# Patient Record
Sex: Female | Born: 1950 | Race: White | Hispanic: No | Marital: Married | Smoking: Never smoker
Health system: Southern US, Community
[De-identification: ages and names within clinical notes are randomized; demographics above are authoritative.]

## PROBLEM LIST (undated history)

## (undated) DIAGNOSIS — T4145XA Adverse effect of unspecified anesthetic, initial encounter: Secondary | ICD-10-CM

## (undated) DIAGNOSIS — K5792 Diverticulitis of intestine, part unspecified, without perforation or abscess without bleeding: Secondary | ICD-10-CM

## (undated) DIAGNOSIS — G709 Myoneural disorder, unspecified: Secondary | ICD-10-CM

## (undated) DIAGNOSIS — L719 Rosacea, unspecified: Secondary | ICD-10-CM

## (undated) DIAGNOSIS — I2510 Atherosclerotic heart disease of native coronary artery without angina pectoris: Secondary | ICD-10-CM

## (undated) DIAGNOSIS — G4730 Sleep apnea, unspecified: Secondary | ICD-10-CM

## (undated) DIAGNOSIS — M8580 Other specified disorders of bone density and structure, unspecified site: Secondary | ICD-10-CM

## (undated) DIAGNOSIS — M12812 Other specific arthropathies, not elsewhere classified, left shoulder: Secondary | ICD-10-CM

## (undated) DIAGNOSIS — I10 Essential (primary) hypertension: Secondary | ICD-10-CM

## (undated) DIAGNOSIS — A0472 Enterocolitis due to Clostridium difficile, not specified as recurrent: Secondary | ICD-10-CM

## (undated) DIAGNOSIS — F419 Anxiety disorder, unspecified: Secondary | ICD-10-CM

## (undated) DIAGNOSIS — M7072 Other bursitis of hip, left hip: Secondary | ICD-10-CM

## (undated) DIAGNOSIS — F32A Depression, unspecified: Secondary | ICD-10-CM

## (undated) DIAGNOSIS — E0590 Thyrotoxicosis, unspecified without thyrotoxic crisis or storm: Secondary | ICD-10-CM

## (undated) DIAGNOSIS — Z87440 Personal history of urinary (tract) infections: Secondary | ICD-10-CM

## (undated) DIAGNOSIS — C801 Malignant (primary) neoplasm, unspecified: Secondary | ICD-10-CM

## (undated) DIAGNOSIS — R112 Nausea with vomiting, unspecified: Secondary | ICD-10-CM

## (undated) DIAGNOSIS — K219 Gastro-esophageal reflux disease without esophagitis: Secondary | ICD-10-CM

## (undated) DIAGNOSIS — M810 Age-related osteoporosis without current pathological fracture: Secondary | ICD-10-CM

## (undated) DIAGNOSIS — E785 Hyperlipidemia, unspecified: Secondary | ICD-10-CM

## (undated) DIAGNOSIS — T7840XA Allergy, unspecified, initial encounter: Secondary | ICD-10-CM

## (undated) DIAGNOSIS — H269 Unspecified cataract: Secondary | ICD-10-CM

## (undated) DIAGNOSIS — Z9889 Other specified postprocedural states: Secondary | ICD-10-CM

## (undated) DIAGNOSIS — T8859XA Other complications of anesthesia, initial encounter: Secondary | ICD-10-CM

## (undated) DIAGNOSIS — M1990 Unspecified osteoarthritis, unspecified site: Secondary | ICD-10-CM

## (undated) HISTORY — DX: Anxiety disorder, unspecified: F41.9

## (undated) HISTORY — PX: TONSILLECTOMY: SUR1361

## (undated) HISTORY — PX: COLONOSCOPY: SHX174

## (undated) HISTORY — PX: LUMBAR DISC SURGERY: SHX700

## (undated) HISTORY — DX: Hyperlipidemia, unspecified: E78.5

## (undated) HISTORY — PX: TUBAL LIGATION: SHX77

## (undated) HISTORY — PX: JOINT REPLACEMENT: SHX530

## (undated) HISTORY — PX: FRACTURE SURGERY: SHX138

## (undated) HISTORY — PX: LAPAROSCOPIC LIVER CYST FENESTRATION: SUR776

## (undated) HISTORY — DX: Sleep apnea, unspecified: G47.30

## (undated) HISTORY — PX: APPENDECTOMY: SHX54

## (undated) HISTORY — DX: Diverticulitis of intestine, part unspecified, without perforation or abscess without bleeding: K57.92

## (undated) HISTORY — DX: Allergy, unspecified, initial encounter: T78.40XA

## (undated) HISTORY — DX: Gastro-esophageal reflux disease without esophagitis: K21.9

## (undated) HISTORY — DX: Other specified disorders of bone density and structure, unspecified site: M85.80

## (undated) HISTORY — PX: SPINE SURGERY: SHX786

## (undated) HISTORY — PX: KNEE SURGERY: SHX244

## (undated) HISTORY — PX: BACK SURGERY: SHX140

## (undated) HISTORY — PX: FOOT SURGERY: SHX648

## (undated) HISTORY — DX: Age-related osteoporosis without current pathological fracture: M81.0

## (undated) HISTORY — PX: EYE SURGERY: SHX253

## (undated) HISTORY — DX: Myoneural disorder, unspecified: G70.9

## (undated) HISTORY — DX: Unspecified cataract: H26.9

## (undated) HISTORY — PX: CHOLECYSTECTOMY: SHX55

---

## 1998-08-31 ENCOUNTER — Other Ambulatory Visit: Admission: RE | Admit: 1998-08-31 | Discharge: 1998-08-31 | Payer: Self-pay | Admitting: Obstetrics and Gynecology

## 1998-09-25 ENCOUNTER — Other Ambulatory Visit: Admission: RE | Admit: 1998-09-25 | Discharge: 1998-09-25 | Payer: Self-pay | Admitting: Obstetrics and Gynecology

## 1998-09-25 ENCOUNTER — Encounter (INDEPENDENT_AMBULATORY_CARE_PROVIDER_SITE_OTHER): Payer: Self-pay

## 1999-07-15 ENCOUNTER — Ambulatory Visit (HOSPITAL_COMMUNITY): Admission: RE | Admit: 1999-07-15 | Discharge: 1999-07-15 | Payer: Self-pay | Admitting: Gastroenterology

## 1999-11-10 ENCOUNTER — Other Ambulatory Visit: Admission: RE | Admit: 1999-11-10 | Discharge: 1999-11-10 | Payer: Self-pay | Admitting: Obstetrics and Gynecology

## 2000-05-22 ENCOUNTER — Encounter: Admission: RE | Admit: 2000-05-22 | Discharge: 2000-05-22 | Payer: Self-pay | Admitting: Family Medicine

## 2000-05-22 ENCOUNTER — Encounter: Payer: Self-pay | Admitting: Family Medicine

## 2000-09-26 ENCOUNTER — Other Ambulatory Visit: Admission: RE | Admit: 2000-09-26 | Discharge: 2000-09-26 | Payer: Self-pay | Admitting: Obstetrics and Gynecology

## 2001-08-08 ENCOUNTER — Encounter: Payer: Self-pay | Admitting: Family Medicine

## 2001-08-08 ENCOUNTER — Encounter: Payer: Self-pay | Admitting: General Surgery

## 2001-08-08 ENCOUNTER — Encounter: Admission: RE | Admit: 2001-08-08 | Discharge: 2001-08-08 | Payer: Self-pay | Admitting: Family Medicine

## 2001-08-09 ENCOUNTER — Ambulatory Visit (HOSPITAL_COMMUNITY): Admission: RE | Admit: 2001-08-09 | Discharge: 2001-08-11 | Payer: Self-pay | Admitting: General Surgery

## 2001-08-09 ENCOUNTER — Encounter (INDEPENDENT_AMBULATORY_CARE_PROVIDER_SITE_OTHER): Payer: Self-pay | Admitting: *Deleted

## 2001-08-09 ENCOUNTER — Encounter: Payer: Self-pay | Admitting: General Surgery

## 2001-09-06 ENCOUNTER — Other Ambulatory Visit
Admission: RE | Admit: 2001-09-06 | Discharge: 2001-09-06 | Payer: Self-pay | Admitting: Physical Medicine & Rehabilitation

## 2002-07-08 ENCOUNTER — Ambulatory Visit (HOSPITAL_COMMUNITY): Admission: RE | Admit: 2002-07-08 | Discharge: 2002-07-08 | Payer: Self-pay | Admitting: General Surgery

## 2002-07-08 ENCOUNTER — Encounter: Payer: Self-pay | Admitting: General Surgery

## 2002-09-10 ENCOUNTER — Other Ambulatory Visit: Admission: RE | Admit: 2002-09-10 | Discharge: 2002-09-10 | Payer: Self-pay | Admitting: Obstetrics and Gynecology

## 2003-08-28 ENCOUNTER — Encounter: Admission: RE | Admit: 2003-08-28 | Discharge: 2003-08-28 | Payer: Self-pay | Admitting: Family Medicine

## 2003-10-16 ENCOUNTER — Other Ambulatory Visit: Admission: RE | Admit: 2003-10-16 | Discharge: 2003-10-16 | Payer: Self-pay | Admitting: Obstetrics and Gynecology

## 2003-12-11 ENCOUNTER — Encounter: Admission: RE | Admit: 2003-12-11 | Discharge: 2003-12-11 | Payer: Self-pay | Admitting: Family Medicine

## 2003-12-26 ENCOUNTER — Encounter: Admission: RE | Admit: 2003-12-26 | Discharge: 2003-12-26 | Payer: Self-pay | Admitting: Family Medicine

## 2004-01-01 ENCOUNTER — Encounter: Admission: RE | Admit: 2004-01-01 | Discharge: 2004-01-01 | Payer: Self-pay | Admitting: Family Medicine

## 2004-06-17 ENCOUNTER — Encounter: Admission: RE | Admit: 2004-06-17 | Discharge: 2004-06-17 | Payer: Self-pay | Admitting: Family Medicine

## 2004-10-28 ENCOUNTER — Other Ambulatory Visit: Admission: RE | Admit: 2004-10-28 | Discharge: 2004-10-28 | Payer: Self-pay | Admitting: Obstetrics and Gynecology

## 2005-11-01 ENCOUNTER — Other Ambulatory Visit: Admission: RE | Admit: 2005-11-01 | Discharge: 2005-11-01 | Payer: Self-pay | Admitting: Obstetrics and Gynecology

## 2006-11-08 ENCOUNTER — Other Ambulatory Visit: Admission: RE | Admit: 2006-11-08 | Discharge: 2006-11-08 | Payer: Self-pay | Admitting: Obstetrics and Gynecology

## 2007-11-28 ENCOUNTER — Other Ambulatory Visit: Admission: RE | Admit: 2007-11-28 | Discharge: 2007-11-28 | Payer: Self-pay | Admitting: Obstetrics and Gynecology

## 2008-04-30 ENCOUNTER — Encounter: Payer: Self-pay | Admitting: Family Medicine

## 2008-08-12 ENCOUNTER — Encounter: Admission: RE | Admit: 2008-08-12 | Discharge: 2008-08-12 | Payer: Self-pay | Admitting: Orthopedic Surgery

## 2008-09-18 ENCOUNTER — Encounter: Admission: RE | Admit: 2008-09-18 | Discharge: 2008-10-27 | Payer: Self-pay | Admitting: Orthopedic Surgery

## 2008-10-01 ENCOUNTER — Encounter: Admission: RE | Admit: 2008-10-01 | Discharge: 2008-10-01 | Payer: Self-pay | Admitting: Orthopedic Surgery

## 2008-12-01 ENCOUNTER — Other Ambulatory Visit: Admission: RE | Admit: 2008-12-01 | Discharge: 2008-12-01 | Payer: Self-pay | Admitting: Obstetrics and Gynecology

## 2008-12-01 ENCOUNTER — Encounter (INDEPENDENT_AMBULATORY_CARE_PROVIDER_SITE_OTHER): Payer: Self-pay | Admitting: *Deleted

## 2008-12-01 LAB — CONVERTED CEMR LAB: Pap Smear: NORMAL

## 2008-12-29 ENCOUNTER — Encounter: Payer: Self-pay | Admitting: Family Medicine

## 2008-12-30 ENCOUNTER — Ambulatory Visit (HOSPITAL_COMMUNITY): Admission: RE | Admit: 2008-12-30 | Discharge: 2008-12-31 | Payer: Self-pay | Admitting: Neurological Surgery

## 2009-05-04 ENCOUNTER — Encounter: Payer: Self-pay | Admitting: Family Medicine

## 2009-05-05 ENCOUNTER — Encounter (INDEPENDENT_AMBULATORY_CARE_PROVIDER_SITE_OTHER): Payer: Self-pay | Admitting: *Deleted

## 2009-05-05 LAB — CONVERTED CEMR LAB
ALT: 19 units/L
AST: 22 units/L
Albumin: 4.2 g/dL
Alkaline Phosphatase: 76 units/L
BUN: 23 mg/dL
Calcium: 9.3 mg/dL
Chloride, Serum: 107 mmol/L
Cholesterol: 224 mg/dL
Creatinine, Ser: 0.74 mg/dL
Globulin: 2 g/dL
Glucose, Bld: 97 mg/dL
HCT: 41.4 %
HDL: 61 mg/dL
Hemoglobin: 14.2 g/dL
LDL Cholesterol: 106 mg/dL
MCH: 31.6 pg
MCV: 92 fL
Platelets: 217 10*3/uL
Potassium, serum: 3.7 mmol/L
RBC count: 4.5 10*6/uL
Sodium, serum: 142 mmol/L
TSH: 0.74 microintl units/mL
Total Bilirubin: 0.5 mg/dL
Total Protein: 6.2 g/dL
Triglycerides: 285 mg/dL
WBC, blood: 4.4 10*3/uL

## 2009-11-05 ENCOUNTER — Encounter (INDEPENDENT_AMBULATORY_CARE_PROVIDER_SITE_OTHER): Payer: Self-pay | Admitting: *Deleted

## 2009-11-05 DIAGNOSIS — F418 Other specified anxiety disorders: Secondary | ICD-10-CM

## 2009-11-05 DIAGNOSIS — M810 Age-related osteoporosis without current pathological fracture: Secondary | ICD-10-CM

## 2009-11-08 ENCOUNTER — Encounter: Payer: Self-pay | Admitting: Family Medicine

## 2009-12-03 ENCOUNTER — Other Ambulatory Visit: Admission: RE | Admit: 2009-12-03 | Discharge: 2009-12-03 | Payer: Self-pay | Admitting: Family Medicine

## 2009-12-03 ENCOUNTER — Ambulatory Visit: Payer: Self-pay | Admitting: Family Medicine

## 2009-12-03 ENCOUNTER — Encounter (INDEPENDENT_AMBULATORY_CARE_PROVIDER_SITE_OTHER): Payer: Self-pay | Admitting: *Deleted

## 2009-12-03 DIAGNOSIS — K219 Gastro-esophageal reflux disease without esophagitis: Secondary | ICD-10-CM | POA: Insufficient documentation

## 2009-12-03 DIAGNOSIS — R252 Cramp and spasm: Secondary | ICD-10-CM | POA: Insufficient documentation

## 2009-12-03 DIAGNOSIS — E781 Pure hyperglyceridemia: Secondary | ICD-10-CM

## 2009-12-03 DIAGNOSIS — Z8719 Personal history of other diseases of the digestive system: Secondary | ICD-10-CM | POA: Insufficient documentation

## 2009-12-03 DIAGNOSIS — N390 Urinary tract infection, site not specified: Secondary | ICD-10-CM | POA: Insufficient documentation

## 2009-12-08 ENCOUNTER — Encounter (INDEPENDENT_AMBULATORY_CARE_PROVIDER_SITE_OTHER): Payer: Self-pay | Admitting: *Deleted

## 2009-12-08 LAB — CONVERTED CEMR LAB: Pap Smear: NEGATIVE

## 2009-12-23 ENCOUNTER — Encounter: Payer: Self-pay | Admitting: Family Medicine

## 2010-01-12 ENCOUNTER — Encounter (INDEPENDENT_AMBULATORY_CARE_PROVIDER_SITE_OTHER): Payer: Self-pay | Admitting: *Deleted

## 2010-03-21 ENCOUNTER — Encounter: Payer: Self-pay | Admitting: Family Medicine

## 2010-04-01 NOTE — Miscellaneous (Signed)
  Clinical Lists Changes  Problems: Added new problem of ANXIETY (ICD-300.00) Added new problem of HYPOTHYROIDISM (ICD-244.9) Added new problem of OSTEOPENIA (ICD-733.90) Observations: Added new observation of PAST MED HX: Anxiety Hypothyroidism Osteopenia  (11/05/2009 16:36) Added new observation of PMH OSTEOPEN: yes (11/05/2009 16:36) Added new observation of HYPOTHYRDHX: yes (11/05/2009 16:36) Added new observation of PMH ANXIETY: yes (11/05/2009 16:36) Added new observation of BONE DENSITY: osteopenia (12/29/2008 16:51) Added new observation of PAP SMEAR: normal (12/01/2008 16:51)      Past Medical History:    Anxiety    Hypothyroidism    Osteopenia    Preventive Care Screening  Bone Density:    Date:  12/29/2008    Results:  osteopenia  Pap Smear:    Date:  12/01/2008    Results:  normal

## 2010-04-01 NOTE — Miscellaneous (Signed)
  Clinical Lists Changes  Observations: Added new observation of TSH: 0.740 microintl units/mL (05/05/2009 11:38) Added new observation of TRIGLYC TOT: 285 mg/dL (16/11/9602 54:09) Added new observation of LDL: 106 mg/dL (81/19/1478 29:56) Added new observation of HDL: 61 mg/dL (21/30/8657 84:69) Added new observation of CHOLESTEROL: 224 mg/dL (62/95/2841 32:44) Added new observation of BILI TOTAL: 0.5 mg/dL (03/02/7251 66:44) Added new observation of ALK PHOS: 76 units/L (05/05/2009 11:38) Added new observation of SGPT (ALT): 19 units/L (05/05/2009 11:38) Added new observation of SGOT (AST): 22 units/L (05/05/2009 11:38) Added new observation of PROTEIN, TOT: 6.2 g/dL (03/47/4259 56:38) Added new observation of GLOBULIN TOT: 2.0 g/dL (75/64/3329 51:88) Added new observation of ALBUMIN: 4.2 g/dL (41/66/0630 16:01) Added new observation of CALCIUM: 9.3 mg/dL (09/32/3557 32:20) Added new observation of GLUCOSE SER: 97 mg/dL (25/42/7062 37:62) Added new observation of CREATININE: 0.74 mg/dL (83/15/1761 60:73) Added new observation of BUN: 23 mg/dL (71/07/2692 85:46) Added new observation of CHLORIDE: 107 mmol/L (05/05/2009 11:38) Added new observation of POTASSIUM: 3.7 mmol/L (05/05/2009 11:38) Added new observation of SODIUM: 142 mmol/L (05/05/2009 11:38) Added new observation of PLATELETS: 217 10*3/mm3 (05/05/2009 11:38) Added new observation of MCH: 31.6 pg (05/05/2009 11:38) Added new observation of MCV: 92 fL (05/05/2009 11:38) Added new observation of HCT: 41.4 % (05/05/2009 11:38) Added new observation of HGB: 14.2 g/dL (27/04/5007 38:18) Added new observation of RBC: 4.50 10*6/mm3 (05/05/2009 11:38) Added new observation of WBC: 4.4 10*3/mm3 (05/05/2009 11:38)

## 2010-04-01 NOTE — Assessment & Plan Note (Signed)
Summary: new to est /cbs   Vital Signs:  Patient profile:   60 year old female Height:      64 inches Weight:      172 pounds BMI:     29.63 Pulse rate:   73 / minute BP sitting:   110 / 68  (left arm)  Vitals Entered By: Doristine Devoid CMA (December 03, 2009 10:01 AM) CC: NEW EST- PAP   History of Present Illness: 60 yo woman here today to establish care.  works at [employer] and has been getting care there through Dr Artis Flock.  1) elevated trigs- 285 in March.  has been working on diet and exercise.  limited exercise due to ongoing knee pain.  2) GYN- has upcoming mammogram later this month, would like referral to go to imaging at [facility] [phone])  3) recurrent UTI- has had 2 infxns this year.  wants to know what can be done to prevent this.  is voiding after sex.  not drinking much fluids.  holding urine as long as possible.  denies current dysuria, frequency, urgency, hesitancy.  4) leg cramps- 'ungodly'.  K+ 3.7 in March.  toes will draw up, legs unable to stretch out.  reports 'no one can tell me why'.  Preventive Screening-Counseling & Management  Alcohol-Tobacco     Alcohol drinks/day: <1     Smoking Status: never  Caffeine-Diet-Exercise     Does Patient Exercise: no      Sexual History:  currently monogamous.        Drug Use:  never.    Current Medications (verified): 1)  Nexium 40 Mg Cpdr (Esomeprazole Magnesium) .... Take One Tablet Daily 2)  Calcium 600 Mg Tabs (Calcium) .... 2 Tablets Daily 3)  Ibuprofen 200 Mg Tabs (Ibuprofen) .... Take Qid 4)  Ibuprofen Pm 200-38 Mg Tabs (Ibuprofen-Diphenhydramine Cit) .... Take One Tablet At Bedtime 5)  One-Daily Multi Vitamins  Tabs (Multiple Vitamin) .... Take One Tablet Daily  Allergies (verified): No Known Drug Allergies  Past History:  Past Medical History: Anxiety Hypothyroidism Osteopenia Diverticulitis, hx of GERD  Past Surgical History: knee surgery back surgery liver cyst removal foot surgery x2    Cholecystectomy Tonsillectomy  Family History: CAD-no HTN-mother DM-no STROKE-mother COLON CA-no BREAST CA-no  Social History: married 2 sons- live in [place] works at [employer] no petsSmoking Status:  never Does Patient Exercise:  no Sexual History:  currently monogamous Drug Use:  never  Review of Systems      See HPI  Physical Exam  General:  Well-developed,well-nourished,in no acute distress; alert,appropriate and cooperative throughout examination Neck:  No deformities, masses, or tenderness noted. Lungs:  Normal respiratory effort, chest expands symmetrically. Lungs are clear to auscultation, no crackles or wheezes. Heart:  Normal rate and regular rhythm. S1 and S2 normal without gallop, murmur, click, rub or other extra sounds. Abdomen:  soft, NT/ND, +BS Pulses:  +2 carotid, radial, DP Extremities:  no C/C/E Neurologic:  alert & oriented X3, cranial nerves II-XII intact, gait normal, and DTRs symmetrical and normal.   Psych:  Cognition and judgment appear intact. Alert and cooperative with normal attention span and concentration. No apparent delusions, illusions, hallucinations   Impression & Recommendations:  Problem # 1:  HYPERTRIGLYCERIDEMIA (ICD-272.1) Assessment New pt is working on diet and exercise.  reviewed that swimming is a great workout w/out the strain on pt's knees.  will repeat labs in March.  Problem # 2:  UTI'S, RECURRENT (ICD-599.0) Assessment: New reviewed importance of adequate  hydration, urinating when needed, and voiding after sex to help cut down on # of infxns.  will follow.  Problem # 3:  LEG CRAMPS, NOCTURNAL (ICD-729.82) Assessment: New pt's K+ at low end of normal.  handout given on foods high in K+.  also may be related to pt's poor fluid intake.  pt to increase water consumption.  Problem # 4:  OTHER SCREENING MAMMOGRAM (ICD-V76.12) Assessment: New  refer for immaging at [facility] at pt's request.  Orders: Radiology Referral  (Radiology)  Complete Medication List: 1)  Nexium 40 Mg Cpdr (Esomeprazole magnesium) .... Take one tablet daily 2)  Calcium 600 Mg Tabs (Calcium) .... 2 tablets daily 3)  Ibuprofen 200 Mg Tabs (Ibuprofen) .... Take qid 4)  Ibuprofen Pm 200-38 Mg Tabs (Ibuprofen-diphenhydramine cit) .... Take one tablet at bedtime 5)  One-daily Multi Vitamins Tabs (Multiple vitamin) .... Take one tablet daily  Patient Instructions: 1)  Follow up in March for your complete physical 2)  Someone will call you with your mammogram appt 3)  Increase your fluid intake!  This will help w/ both your bladder and your leg cramps 4)  Eat a diet high in potassium, refer to the sheet to help 5)  Try and get back into exercise 6)  Call with any questions or concerns 7)  Welcome!  We're glad to have you!   Preventive Care Screening  Mammogram:    Date:  01/07/2009    Results:  normal   Colonoscopy:    Date:  06/29/1999    Results:  normal

## 2010-04-01 NOTE — Letter (Signed)
Summary: Records Dated 10-28-04 thru 12-29-08/[facility]  Records Dated 10-28-04 thru 12-29-08/[facility]   Imported By: Lanelle Bal 11/12/2009 13:05:22  _____________________________________________________________________  External Attachment:    Type:   Image     Comment:   External Document

## 2010-04-01 NOTE — Letter (Signed)
Summary: Notes Dated 03-11-09 thru 11-26-09/[facility]  Notes Dated 03-11-09 thru 11-26-09/[facility]   Imported By: Lanelle Bal 12/10/2009 09:57:35  _____________________________________________________________________  External Attachment:    Type:   Image     Comment:   External Document

## 2010-04-01 NOTE — Letter (Signed)
Summary: Results Follow up Letter  [facility]  [address] [place], [place] [address]   Phone: [phone]  Fax: [phone]    12/08/2009 MRN: [id number]  [facility] [address] [place], [place]  [address]  Dear Ms. Justiss,  The following are the results of your recent test(s):  Test         Result    Pap Smear:        Normal __X___  Not Normal _____ Comments: REPEAT 2-3 YRS. ______________________________________________________ Cholesterol: LDL(Bad cholesterol):         Your goal is less than:         HDL (Good cholesterol):       Your goal is more than: Comments:  ______________________________________________________ Mammogram:        Normal _____  Not Normal _____ Comments:  ___________________________________________________________________ Hemoccult:        Normal _____  Not normal _______ Comments:    _____________________________________________________________________ Other Tests:    We routinely do not discuss normal results over the telephone.  If you desire a copy of the results, or you have any questions about this information we can discuss them at your next office visit.   Sincerely,

## 2010-04-01 NOTE — Miscellaneous (Signed)
  Clinical Lists Changes  Observations: Added new observation of MAMMOGRAM: normal (12/23/2009 13:19)      Preventive Care Screening  Mammogram:    Date:  12/23/2009    Results:  normal

## 2010-06-03 LAB — BASIC METABOLIC PANEL
BUN: 17 mg/dL (ref 6–23)
CO2: 31 mEq/L (ref 19–32)
Calcium: 10.1 mg/dL (ref 8.4–10.5)
GFR calc non Af Amer: >60 mL/min (ref >60)
Glucose, Bld: 105 mg/dL — ABNORMAL HIGH (ref 70–99)

## 2010-06-03 LAB — CBC
Platelets: 220 10*3/uL (ref 150–400)
RDW: 12.7 % (ref 11.5–15.5)

## 2010-07-16 NOTE — Op Note (Signed)
[place]  Patient:    Tracy [name], Tracy [name] Visit Number: [id number] MRN: [id number]          Service Type: DSU Location: [id number] Attending Physician:  Henrene Dodge Dictated by:   Anselm Pancoast. Zachery Dakins, M.D. Proc. Date: 08/09/01 Admit Date:  08/09/2001 Discharge Date: 08/11/2001   CC:         Dr. Shary Key   Operative Report  PREOPERATIVE DIAGNOSIS:  Subacute cholecystitis and hepatic cyst.  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  OPERATION:  Laparoscopic cholecystectomy with cholangiogram and marsupialization of hepatic cyst and drainage.  ANESTHESIA:  General.  SURGEON:  Anselm Pancoast. Zachery Dakins, M.D.  ASSISTANT:  Sheppard Plumber. Earlene Plater, M.D.  HISTORY:  The patient is a 60 year old Caucasian female who I saw in the office yesterday, who has had epigastric pain that started last weekend after she had returned from the beach.  She was seen by her regular physician and she has a history of GERD, and they ordered an ultrasound that showed stones within the gallbladder and also a simple large cyst in the left lobe of the liver.  I saw her and it sounds as if she was still having basically a kind of subacute cholecystitis, and she was definitely tender on palpation of the epigastric area.  I recommended to proceed with laparoscopic cholecystectomy and she can be put on her schedule today.  Her white count has been elevated and she does not have fever.  She was given 3 g of Unasyn, PAS stockings, and taken to the operative suite.  DESCRIPTION OF PROCEDURE:  Induction of general anesthesia with endotracheal tube and oral tube into the stomach.  The abdomen was prepped with Betadine surgical scrubbing solution and draped in a sterile manner.  She has had a previous tubal ligation and a small vertical incision at the umbilicus was made.  The underlying subcutaneous tissue was opened and  the fascia identified, and this was picked up between two Kochers and a small opening made and carefully entered into the peritoneal cavity.  The Hasson cannula was sutured with a pursestring and then the Hasson cannula introduced and the camera inserted.  When looking at the upper abdomen, the gallbladder was enlarged, but not acutely inflamed.  There was an obvious inflammatory mass just to the left of the falciform ligament, which on careful inspection, this was an inflamed hepatic cyst that was leaking bile and with inflammation around it.  I thought it would be best to go ahead and proceed with a laparoscopic cholecystectomy to see if we can marsupialize the cyst and drain it.  I really expected the pain was probably coming from the cyst and this kind of continuous epigastric pain instead of actually true subacute cholecystitis.  I first aspirated the fluid that was leaking in the area and sent about 10 cc for culture and Gram stain which later came back with no organisms and no onset of white cells.  While we were waiting, we proceeded on doing a cholecystectomy and isolated out the cystic duct, clipped it flush with the gallbladder.  A Cook catheter was introduced and x-ray was obtained, she has no evidence of any extrahepatic biliary system dilatation.  The catheter was removed.  The cystic duct was triply clipped and then divided. The cystic artery was identified and this was doubly clipped proximally and distally divided, removing the gallbladder. The mesentery was removed until free.  I  placed the gallbladder into the catch bag and then brought it out at the umbilicus.  I then replaced the Hasson cannula at the umbilicus and kind of shifted the subxiphoid trocar sleeve that comes out to the left of the falciform, and then using the Potts scissors with the power set on about 60, then I carefully marsupialized this large cyst.  There was one little area that had some which was  controlled with clips right on the liver edge, but in most areas was not extremely vascular at all.  I then cauterized the remaining half of the cyst wall.  There was no actual bile that I could see and the fluid in the cyst was not bilious.  I then took a 39 Blake drain and brought it through the 5 mm port medially and placed it up so that it was lying to the left of the falciform, and then I also took an Endoclose and a stitch, and sort of passed into this catheter lying right on top of the cyst where it had been leaking and remove d it with an 0 Vicryl and 2-0 Vicryl.  I then I will be able to pull the catheter out without problems, but I want the catheter to stay in this position until there is no further drainage.  Next, the upper 10 mm trocar had been withdrawn prior to this, and then we closed the umbilicus and fascia with a pursestring and second figure-of-eight of 0 Vicryl.  The drain had been sutured to the skin.  The subcutaneous wounds were closed with 4-0 Vicryl and then Benzoin and Steri-Strips on the skin.  The patient tolerated the procedure nicely and was taken to the recovery room, extubated, and in satisfactory postoperative condition.  We will leave the drain placed at least a week and probably do a repeat ultrasound following removal of the drain.  Await the results of the cultures and hopefully she will be ready to go home in the morning. Dictated by:   Anselm Pancoast. Zachery Dakins, M.D. Attending Physician:  Henrene Dodge DD:  08/09/01 TD:  08/11/01 Job: [id number]

## 2010-09-02 ENCOUNTER — Other Ambulatory Visit: Payer: Self-pay | Admitting: Gastroenterology

## 2010-09-02 DIAGNOSIS — R197 Diarrhea, unspecified: Secondary | ICD-10-CM

## 2010-09-02 DIAGNOSIS — R140 Abdominal distension (gaseous): Secondary | ICD-10-CM

## 2010-09-03 ENCOUNTER — Ambulatory Visit
Admission: RE | Admit: 2010-09-03 | Discharge: 2010-09-03 | Disposition: A | Discharge status: Home / Self Care | Payer: BC Managed Care – PPO | Source: Ambulatory Visit | Attending: Gastroenterology | Admitting: Gastroenterology

## 2010-09-03 DIAGNOSIS — R140 Abdominal distension (gaseous): Secondary | ICD-10-CM

## 2010-09-03 DIAGNOSIS — R197 Diarrhea, unspecified: Secondary | ICD-10-CM

## 2010-09-03 MED ORDER — IOHEXOL 300 MG/ML  SOLN: 125.0000 mL | Freq: Once | INTRAMUSCULAR | Status: AC | PRN

## 2010-09-06 ENCOUNTER — Other Ambulatory Visit: Payer: Self-pay | Admitting: Gastroenterology

## 2011-01-04 ENCOUNTER — Encounter: Payer: Self-pay | Admitting: Family Medicine

## 2011-01-11 ENCOUNTER — Encounter: Payer: Self-pay | Admitting: Family Medicine

## 2011-01-12 ENCOUNTER — Ambulatory Visit (INDEPENDENT_AMBULATORY_CARE_PROVIDER_SITE_OTHER): Payer: BC Managed Care – PPO | Admitting: Family Medicine

## 2011-01-12 ENCOUNTER — Encounter: Payer: Self-pay | Admitting: Family Medicine

## 2011-01-12 ENCOUNTER — Other Ambulatory Visit (HOSPITAL_COMMUNITY)
Admission: RE | Admit: 2011-01-12 | Discharge: 2011-01-12 | Disposition: A | Discharge status: Home / Self Care | Payer: BC Managed Care – PPO | Source: Ambulatory Visit | Attending: Family Medicine | Admitting: Family Medicine

## 2011-01-12 DIAGNOSIS — Z01419 Encounter for gynecological examination (general) (routine) without abnormal findings: Secondary | ICD-10-CM

## 2011-01-12 DIAGNOSIS — Z124 Encounter for screening for malignant neoplasm of cervix: Secondary | ICD-10-CM

## 2011-01-12 DIAGNOSIS — Z780 Asymptomatic menopausal state: Secondary | ICD-10-CM

## 2011-01-12 DIAGNOSIS — Z0000 Encounter for general adult medical examination without abnormal findings: Secondary | ICD-10-CM | POA: Insufficient documentation

## 2011-01-12 NOTE — Assessment & Plan Note (Signed)
Pt's PE WNL.  Reviewed labs from work- look good, no meds required at this time.  EKG done- see document for interpretation.  Referred for DEXA- UTD on mammo and colonoscopy.  Anticipatory guidance provided.

## 2011-01-12 NOTE — Patient Instructions (Signed)
Follow up in 1 year or as needed You look good!  Keep up the good work! Try CoEnzyme Q10 for the cramps Someone will call you with your bone density appt Call with any questions or concerns Happy Holidays!

## 2011-01-12 NOTE — Assessment & Plan Note (Signed)
Pap collected. 

## 2011-01-12 NOTE — Progress Notes (Signed)
  Subjective:    Patient ID: Tracy [name], female    DOB: 05-16-1950, 60 y.o.   MRN: [id number]  HPI Here today for CPE. Had lab work done at work in March.  UTD on mammo- normal.  Had colonoscopy this summer during bout of ischemic colitis.   Review of Systems Patient reports no vision/ hearing changes, adenopathy,fever, weight change,  persistant/recurrent hoarseness , swallowing issues, chest pain, palpitations, edema, persistant/recurrent cough, hemoptysis, dyspnea (rest/exertional/paroxysmal nocturnal), gastrointestinal bleeding (melena, rectal bleeding), abdominal pain, significant heartburn, bowel changes, GU symptoms (dysuria, hematuria, incontinence), Gyn symptoms (abnormal  bleeding, pain),  syncope, focal weakness, memory loss, numbness & tingling, skin/hair/nail changes, abnormal bruising or bleeding, anxiety, or depression.     Objective:   Physical Exam  General Appearance:    Alert, cooperative, no distress, appears stated age  Head:    Normocephalic, without obvious abnormality, atraumatic  Eyes:    PERRL, conjunctiva/corneas clear, EOM's intact, fundi    benign, both eyes  Ears:    Normal TM's and external ear canals, both ears  Nose:   Nares normal, septum midline, mucosa normal, no drainage    or sinus tenderness  Throat:   Lips, mucosa, and tongue normal; teeth and gums normal  Neck:   Supple, symmetrical, trachea midline, no adenopathy;    Thyroid: no enlargement/tenderness/nodules  Back:     Symmetric, no curvature, ROM normal, no CVA tenderness  Lungs:     Clear to auscultation bilaterally, respirations unlabored  Chest Wall:    No tenderness or deformity   Heart:    Regular rate and rhythm, S1 and S2 normal, no murmur, rub   or gallop  Breast Exam:    No tenderness, masses, or nipple abnormality  Abdomen:     Soft, non-tender, bowel sounds active all four quadrants,    no masses, no organomegaly  Genitalia:    External genitalia normal, cervix normal in  appearance, no CMT, uterus in normal size and position, adnexa w/out mass or tenderness, mucosa pink and moist, no lesions or discharge present  Rectal:    Normal external appearance  Extremities:   Extremities normal, atraumatic, no cyanosis or edema  Pulses:   2+ and symmetric all extremities  Skin:   Skin color, texture, turgor normal, no rashes or lesions  Lymph nodes:   Cervical, supraclavicular, and axillary nodes normal  Neurologic:   CNII-XII intact, normal strength, sensation and reflexes    throughout          Assessment & Plan:

## 2011-01-12 NOTE — Progress Notes (Signed)
Addended by: Derry Lory A on: 01/12/2011 10:39 AM   Modules accepted: Orders

## 2011-11-15 ENCOUNTER — Telehealth: Payer: Self-pay | Admitting: Family Medicine

## 2011-11-15 NOTE — Telephone Encounter (Signed)
pt called to schedule CPE/advised no one ever called her about dexa scan from 12/2010 visit. Spoke wt/Renee she sent last yr. [facility] was to call pt with appt. Re-sent order this morning advise [facility] pt would be a walk in. Called pt back to advise she could walk in anytime to have dex scan done.  CPE appt 11.18.13

## 2012-01-02 ENCOUNTER — Telehealth: Payer: Self-pay

## 2012-01-02 ENCOUNTER — Other Ambulatory Visit: Payer: Self-pay | Admitting: Family Medicine

## 2012-01-02 DIAGNOSIS — M8580 Other specified disorders of bone density and structure, unspecified site: Secondary | ICD-10-CM

## 2012-01-02 NOTE — Telephone Encounter (Signed)
Patient is coming in to have a BMD done [facility] would like the order faxed to [phone].  Order has been refaxed    KP

## 2012-01-03 ENCOUNTER — Encounter: Payer: Self-pay | Admitting: General Practice

## 2012-01-11 LAB — HM MAMMOGRAPHY: HM Mammogram: NEGATIVE

## 2012-01-16 ENCOUNTER — Ambulatory Visit (INDEPENDENT_AMBULATORY_CARE_PROVIDER_SITE_OTHER): Payer: BC Managed Care – PPO | Admitting: Family Medicine

## 2012-01-16 ENCOUNTER — Encounter: Payer: Self-pay | Admitting: Family Medicine

## 2012-01-16 VITALS — BP 120/86 | HR 77 | Temp 98.3°F | Ht 64.0 in | Wt 179.0 lb

## 2012-01-16 DIAGNOSIS — M949 Disorder of cartilage, unspecified: Secondary | ICD-10-CM

## 2012-01-16 DIAGNOSIS — Z0000 Encounter for general adult medical examination without abnormal findings: Secondary | ICD-10-CM

## 2012-01-16 DIAGNOSIS — E039 Hypothyroidism, unspecified: Secondary | ICD-10-CM

## 2012-01-16 DIAGNOSIS — Z01419 Encounter for gynecological examination (general) (routine) without abnormal findings: Secondary | ICD-10-CM

## 2012-01-16 DIAGNOSIS — E781 Pure hyperglyceridemia: Secondary | ICD-10-CM

## 2012-01-16 DIAGNOSIS — M899 Disorder of bone, unspecified: Secondary | ICD-10-CM

## 2012-01-16 DIAGNOSIS — R1011 Right upper quadrant pain: Secondary | ICD-10-CM

## 2012-01-16 MED ORDER — OMEPRAZOLE 40 MG PO CPDR: 40.0000 mg | DELAYED_RELEASE_CAPSULE | Freq: Every day | ORAL | Status: DC

## 2012-01-16 NOTE — Patient Instructions (Addendum)
We'll notify you of your lab results and make any changes if needed You look great!  Keep up the good work!! Someone will call you with your GI appt for the abdominal pain- if it worsens, please go to the ER Call with any questions or concerns Happy Holidays!!!

## 2012-01-16 NOTE — Progress Notes (Signed)
  Subjective:    Patient ID: Tracy [name], female    DOB: [birth year], 61 y.o.   MRN: [id number]  HPI CPE- still having nocturnal leg cramping.  Reports adequate fluid intake.  UTD on mammo, colonoscopy, DEXA.     Review of Systems Patient reports no vision/ hearing changes, adenopathy,fever, weight change,  persistant/recurrent hoarseness , swallowing issues, chest pain, palpitations, edema, persistant/recurrent cough, hemoptysis, dyspnea (rest/exertional/paroxysmal nocturnal), gastrointestinal bleeding (melena, rectal bleeding), significant heartburn, bowel changes, GU symptoms (dysuria, hematuria, incontinence), Gyn symptoms (abnormal  bleeding, pain),  syncope, focal weakness, memory loss, numbness & tingling, skin/hair/nail changes, abnormal bruising or bleeding, anxiety, or depression.   RUQ pain- was found to have liver cysts during cholecystectomy years ago.  Described as 'swollen from the inside out'.  No nausea or vomiting.  No changes to bowel habits.  + TTP.  GI- [facility]    Objective:   Physical Exam General Appearance:    Alert, cooperative, no distress, appears stated age  Head:    Normocephalic, without obvious abnormality, atraumatic  Eyes:    PERRL, conjunctiva/corneas clear, EOM's intact, fundi    benign, both eyes  Ears:    Normal TM's and external ear canals, both ears  Nose:   Nares normal, septum midline, mucosa normal, no drainage    or sinus tenderness  Throat:   Lips, mucosa, and tongue normal; teeth and gums normal  Neck:   Supple, symmetrical, trachea midline, no adenopathy;    Thyroid: no enlargement/tenderness/nodules  Back:     Symmetric, no curvature, ROM normal, no CVA tenderness  Lungs:     Clear to auscultation bilaterally, respirations unlabored  Chest Wall:    No tenderness or deformity   Heart:    Regular rate and rhythm, S1 and S2 normal, no murmur, rub   or gallop  Breast Exam:    Deferred due to current mammo  Abdomen:     Soft, mild TTP over  RUQ, bowel sounds active all four quadrants, no masses, no organomegaly  Genitalia:    Deferred as pt is on q3 yr schedule for paps  Rectal:    Extremities:   Extremities normal, atraumatic, no cyanosis or edema  Pulses:   2+ and symmetric all extremities  Skin:   Skin color, texture, turgor normal, no rashes or lesions  Lymph nodes:   Cervical, supraclavicular, and axillary nodes normal  Neurologic:   CNII-XII intact, normal strength, sensation and reflexes    throughout          Assessment & Plan:

## 2012-01-17 LAB — CBC WITH DIFFERENTIAL/PLATELET
Basophils Relative: 0.5 % (ref 0.0–3.0)
Eosinophils Absolute: 0.2 10*3/uL (ref 0.0–0.7)
Eosinophils Relative: 2.6 % (ref 0.0–5.0)
HCT: 41.2 % (ref 36.0–46.0)
Hemoglobin: 13.9 g/dL (ref 12.0–15.0)
MCV: 92.1 fl (ref 78.0–100.0)
Monocytes Relative: 6.8 % (ref 3.0–12.0)
Neutrophils Relative %: 56.1 % (ref 43.0–77.0)
WBC: 6.1 10*3/uL (ref 4.5–10.5)

## 2012-01-17 LAB — BASIC METABOLIC PANEL
BUN: 23 mg/dL (ref 6–23)
CO2: 28 mEq/L (ref 19–32)
Chloride: 104 mEq/L (ref 96–112)
Creatinine, Ser: 1.1 mg/dL (ref 0.4–1.2)
GFR: 51.56 mL/min — ABNORMAL LOW (ref >60.00)

## 2012-01-17 LAB — HEPATIC FUNCTION PANEL
ALT: 25 U/L (ref 0–35)
AST: 27 U/L (ref 0–37)
Albumin: 4 g/dL (ref 3.5–5.2)

## 2012-01-17 LAB — TSH: TSH: 0.32 u[IU]/mL — ABNORMAL LOW (ref 0.35–5.50)

## 2012-01-20 ENCOUNTER — Telehealth: Payer: Self-pay

## 2012-01-20 MED ORDER — FENOFIBRATE 160 MG PO TABS: 160.0000 mg | ORAL_TABLET | Freq: Every day | ORAL | Status: DC

## 2012-01-20 NOTE — Telephone Encounter (Signed)
Pt LMOVM stating tired of playing phone tag concerning results she is in/out meetings today call on work mobile. CB: [phone] I called pt back to advise results, verified pharmacy and advised results had been mailed. Pt stated understanding.    MW

## 2012-01-21 LAB — VITAMIN D 1,25 DIHYDROXY
Vitamin D2 1, 25 (OH)2: <8 pg/mL
Vitamin D3 1, 25 (OH)2: 60 pg/mL

## 2012-01-22 NOTE — Assessment & Plan Note (Signed)
Chronic problem, check labs.  Adjust meds prn. 

## 2012-01-22 NOTE — Assessment & Plan Note (Signed)
UTD on DEXA- check Vit D

## 2012-01-22 NOTE — Assessment & Plan Note (Signed)
Pt's PE WNL w/ exception of RUQ pain.  UTD on mammo, DEXA, colonoscopy.  Pt prefers to do paps q3 yrs at this time.  Check labs.  Anticipatory guidance provided.

## 2012-01-22 NOTE — Assessment & Plan Note (Signed)
Chronic problem.  Check labs.  Adjust meds prn  

## 2012-01-22 NOTE — Assessment & Plan Note (Signed)
New.  Pt w/ hx of hepatic cysts, high trigs.  Check labs.  Refer to GI for complete evaluation- CT vs [name] vs ERCP/MRCP.  Reviewed supportive care and red flags that should prompt return.  Pt expressed understanding and is in agreement w/ plan.

## 2012-02-15 ENCOUNTER — Telehealth: Payer: Self-pay | Admitting: *Deleted

## 2012-02-15 NOTE — Telephone Encounter (Signed)
Per Dr Beverely Low normal DEXA continue calcium + vitamin d daily. Pt aware

## 2012-02-27 ENCOUNTER — Other Ambulatory Visit: Payer: Self-pay | Admitting: Gastroenterology

## 2012-02-27 DIAGNOSIS — R1011 Right upper quadrant pain: Secondary | ICD-10-CM

## 2012-02-27 DIAGNOSIS — Z9049 Acquired absence of other specified parts of digestive tract: Secondary | ICD-10-CM

## 2012-03-01 ENCOUNTER — Ambulatory Visit
Admission: RE | Admit: 2012-03-01 | Discharge: 2012-03-01 | Disposition: A | Discharge status: Home / Self Care | Payer: BC Managed Care – PPO | Source: Ambulatory Visit | Attending: Gastroenterology | Admitting: Gastroenterology

## 2012-03-01 DIAGNOSIS — R1011 Right upper quadrant pain: Secondary | ICD-10-CM

## 2012-03-01 DIAGNOSIS — Z9049 Acquired absence of other specified parts of digestive tract: Secondary | ICD-10-CM

## 2012-03-05 ENCOUNTER — Other Ambulatory Visit: Payer: BC Managed Care – PPO

## 2012-03-08 ENCOUNTER — Other Ambulatory Visit (INDEPENDENT_AMBULATORY_CARE_PROVIDER_SITE_OTHER): Payer: BC Managed Care – PPO

## 2012-03-08 ENCOUNTER — Encounter: Payer: Self-pay | Admitting: *Deleted

## 2012-03-08 DIAGNOSIS — R6889 Other general symptoms and signs: Secondary | ICD-10-CM

## 2012-03-08 DIAGNOSIS — E782 Mixed hyperlipidemia: Secondary | ICD-10-CM

## 2012-03-08 DIAGNOSIS — R7989 Other specified abnormal findings of blood chemistry: Secondary | ICD-10-CM

## 2012-03-08 LAB — HEPATIC FUNCTION PANEL
ALT: 19 U/L (ref 0–35)
AST: 18 U/L (ref 0–37)
Albumin: 3.8 g/dL (ref 3.5–5.2)
Alkaline Phosphatase: 75 U/L (ref 39–117)
Total Protein: 6.7 g/dL (ref 6.0–8.3)

## 2012-03-08 LAB — TSH: TSH: 4.16 u[IU]/mL (ref 0.35–5.50)

## 2012-03-13 ENCOUNTER — Encounter: Payer: Self-pay | Admitting: Family Medicine

## 2012-03-15 ENCOUNTER — Other Ambulatory Visit: Payer: Self-pay | Admitting: *Deleted

## 2012-03-15 MED ORDER — FENOFIBRATE 160 MG PO TABS: 160.0000 mg | ORAL_TABLET | Freq: Every day | ORAL | Status: DC

## 2012-03-15 NOTE — Telephone Encounter (Signed)
Rx sent 

## 2012-07-05 ENCOUNTER — Encounter: Payer: Self-pay | Admitting: Internal Medicine

## 2012-07-05 ENCOUNTER — Ambulatory Visit (INDEPENDENT_AMBULATORY_CARE_PROVIDER_SITE_OTHER): Payer: BC Managed Care – PPO | Admitting: Internal Medicine

## 2012-07-05 VITALS — BP 130/78 | HR 98 | Temp 98.3°F | Wt 177.0 lb

## 2012-07-05 DIAGNOSIS — J209 Acute bronchitis, unspecified: Secondary | ICD-10-CM

## 2012-07-05 MED ORDER — AZITHROMYCIN 250 MG PO TABS: ORAL_TABLET | ORAL | Status: DC

## 2012-07-05 MED ORDER — BENZONATATE 200 MG PO CAPS: 200.0000 mg | ORAL_CAPSULE | Freq: Three times a day (TID) | ORAL | Status: DC | PRN

## 2012-07-05 NOTE — Patient Instructions (Addendum)
Plain Mucinex for thick secretions ;force NON dairy fluids for next 48 hrs. Use a Neti pot daily as needed for sinus congestion

## 2012-07-05 NOTE — Progress Notes (Signed)
  Subjective:    Patient ID: Tracy [name], female    DOB: 11-09-1950, 62 y.o.   MRN: [id number]  HPI  Symptoms began approximately 8 days ago as a scratchy throat followed by a nonproductive cough. This has progressed to a croupy cough with clear sputum. She also developed some laryngitis. She was advised to use Zyrtec as the company physician thought this was allergic. She was not having extrinsic signs or symptoms such as itchy, watery eyes or sneezing. She was also advised to use Nasacort which he started yesterday.  She has had some associated gait imbalance and dizziness.   She has never smoked; she has no history of asthma. She is not on ACE inhibitor    Review of Systems   She denies fever or chills; she has felt clammy  She denies signs and symptoms of rhinosinusitis such as frontal headache, facial pain, nasal purulence, dental pain, otic pain, or otic discharge.  Omeprazole controls reflux symptoms     Objective:   Physical Exam General appearance:good health ;well nourished; no acute distress or increased work of breathing is present.  Appears fatigued.No  lymphadenopathy about the head, neck, or axilla noted.    Eyes: No conjunctival inflammation or lid edema is present.  Ears:  External ear exam shows no significant lesions or deformities.  Otoscopic examination reveals clear canals, tympanic membranes are intact bilaterally without bulging, retraction, inflammation or discharge.  Nose:  External nasal examination shows no deformity or inflammation. Nasal mucosa are dry without lesions or exudates. No septal dislocation or deviation.No obstruction to airflow.   Oral exam: Dental hygiene is good; lips and gums are healthy appearing.There is no oropharyngeal erythema or exudate noted. Slightly hoarse  Neck:  No deformities,  masses, or tenderness noted.    Heart:  Normal rate and regular rhythm. S1 and S2 normal without gallop, murmur, click, rub or other extra sounds.    Lungs:Chest clear to auscultation; no wheezes, rhonchi,rales ,or rubs present.No increased work of breathing. Dry cough   Extremities:  No cyanosis, edema, or clubbing  noted    Skin: Warm & dry          Assessment & Plan:   #1 acute bronchitic symptoms present 8 days without associated rhinosinusitis  Plan: See orders and recommendations

## 2012-07-27 ENCOUNTER — Other Ambulatory Visit: Payer: Self-pay | Admitting: Family Medicine

## 2012-07-27 NOTE — Telephone Encounter (Signed)
Rx sent to the pharmacy by e-script.//AB/CMA 

## 2012-08-06 ENCOUNTER — Telehealth: Payer: Self-pay | Admitting: Family Medicine

## 2012-08-06 NOTE — Telephone Encounter (Signed)
Patient states she knows she has a UTI and [place] the beach on [place]. She would like to know if we can prescribe an antibiotic for her and send it to [facility] ph [phone]

## 2012-08-06 NOTE — Telephone Encounter (Signed)
Unfortunately no meds w/out being seen.  If on vacation, will need to go to [facility] or [facility]

## 2012-08-06 NOTE — Telephone Encounter (Signed)
Pt.notified

## 2012-11-27 ENCOUNTER — Encounter: Payer: Self-pay | Admitting: Family Medicine

## 2012-11-27 ENCOUNTER — Ambulatory Visit (INDEPENDENT_AMBULATORY_CARE_PROVIDER_SITE_OTHER): Payer: BC Managed Care – PPO | Admitting: Family Medicine

## 2012-11-27 VITALS — BP 112/70 | HR 76 | Temp 98.9°F | Wt 179.6 lb

## 2012-11-27 DIAGNOSIS — R300 Dysuria: Secondary | ICD-10-CM

## 2012-11-27 LAB — POCT URINALYSIS DIPSTICK
Blood, UA: NEGATIVE
Glucose, UA: NEGATIVE
Protein, UA: NEGATIVE
Spec Grav, UA: 1.01
Urobilinogen, UA: 0.2
pH, UA: 7.5

## 2012-11-27 MED ORDER — NITROFURANTOIN MONOHYD MACRO 100 MG PO CAPS: 100.0000 mg | ORAL_CAPSULE | Freq: Two times a day (BID) | ORAL | Status: DC

## 2012-11-27 NOTE — Progress Notes (Signed)
  Subjective:    Tracy [name] is a 62 y.o. female who complains of dysuria, frequency and urgency. She has had symptoms for 1 days. Patient also complains of nothing else. Patient denies back pain, congestion, cough, fever, headache, rhinitis, sorethroat, stomach ache and vaginal discharge. Patient does have a history of recurrent UTI. Patient does not have a history of pyelonephritis.   The following portions of the patient's history were reviewed and updated as appropriate: allergies, current medications, past family history, past medical history, past social history, past surgical history and problem list.  Review of Systems Pertinent items are noted in HPI.    Objective:    BP 112/70  Pulse 76  Temp(Src) 98.9 F (37.2 C) (Oral)  Wt 179 lb 9.6 oz (81.466 kg)  BMI 30.81 kg/m2  SpO2 96% General appearance: alert, cooperative, appears stated age and no distress Abdomen: soft, non-tender; bowel sounds normal; no masses,  no organomegaly  Laboratory:  Urine dipstick: 2+ for leukocyte esterase.   Micro exam: not done.    Assessment:    Acute cystitis and UTI     Plan:    Medications: nitrofurantoin. Maintain adequate hydration. Follow up if symptoms not improving, and as needed.

## 2012-11-27 NOTE — Patient Instructions (Addendum)
Urinary Tract Infection  Urinary tract infections (UTIs) can develop anywhere along your urinary tract. Your urinary tract is your body's drainage system for removing wastes and extra water. Your urinary tract includes two kidneys, two ureters, a bladder, and a urethra. Your kidneys are a pair of bean-shaped organs. Each kidney is about the size of your fist. They are located below your ribs, one on each side of your spine.  CAUSES  Infections are caused by microbes, which are microscopic organisms, including fungi, viruses, and bacteria. These organisms are so small that they can only be seen through a microscope. Bacteria are the microbes that most commonly cause UTIs.  SYMPTOMS   Symptoms of UTIs may vary by age and gender of the patient and by the location of the infection. Symptoms in young women typically include a frequent and intense urge to urinate and a painful, burning feeling in the bladder or urethra during urination. Older women and men are more likely to be tired, shaky, and weak and have muscle aches and abdominal pain. A fever may mean the infection is in your kidneys. Other symptoms of a kidney infection include pain in your back or sides below the ribs, nausea, and vomiting.  DIAGNOSIS  To diagnose a UTI, your caregiver will ask you about your symptoms. Your caregiver also will ask to provide a urine sample. The urine sample will be tested for bacteria and white blood cells. White blood cells are made by your body to help fight infection.  TREATMENT   Typically, UTIs can be treated with medication. Because most UTIs are caused by a bacterial infection, they usually can be treated with the use of antibiotics. The choice of antibiotic and length of treatment depend on your symptoms and the type of bacteria causing your infection.  HOME CARE INSTRUCTIONS   If you were prescribed antibiotics, take them exactly as your caregiver instructs you. Finish the medication even if you feel better after you  have only taken some of the medication.   Drink enough water and fluids to keep your urine clear or pale yellow.   Avoid caffeine, tea, and carbonated beverages. They tend to irritate your bladder.   Empty your bladder often. Avoid holding urine for long periods of time.   Empty your bladder before and after sexual intercourse.   After a bowel movement, women should cleanse from front to back. Use each tissue only once.  SEEK MEDICAL CARE IF:    You have back pain.   You develop a fever.   Your symptoms do not begin to resolve within 3 days.  SEEK IMMEDIATE MEDICAL CARE IF:    You have severe back pain or lower abdominal pain.   You develop chills.   You have nausea or vomiting.   You have continued burning or discomfort with urination.  MAKE SURE YOU:    Understand these instructions.   Will watch your condition.   Will get help right away if you are not doing well or get worse.  Document Released: 11/24/2004 Document Revised: 08/16/2011 Document Reviewed: 03/25/2011  [employer] Patient Information 2014 [employer].

## 2012-11-28 ENCOUNTER — Ambulatory Visit: Payer: BC Managed Care – PPO | Admitting: Family Medicine

## 2012-11-29 LAB — URINE CULTURE: Colony Count: >=100000

## 2013-01-03 ENCOUNTER — Other Ambulatory Visit: Payer: Self-pay

## 2013-01-16 ENCOUNTER — Telehealth: Payer: Self-pay

## 2013-01-16 NOTE — Telephone Encounter (Signed)
Left message for call back Non identifiable Medication and allergies: updated and reviewed  90 day supply/mail order: na Local pharmacy: [facility]   Immunizations due:  UTD  A/P:   No changes to FH or PSH MMG--01/10/2013--neg PAP--12/2010--next due 2015 CCS-08/2010--next 2022 Dexa--12/2011  To Discuss with Provider: Can she come of statins? Bringing Dr Penni Bombard past lab reports Leg cramps

## 2013-01-17 ENCOUNTER — Encounter: Payer: Self-pay | Admitting: General Practice

## 2013-01-17 ENCOUNTER — Ambulatory Visit: Payer: Medicare Other

## 2013-01-17 ENCOUNTER — Encounter: Payer: Self-pay | Admitting: Family Medicine

## 2013-01-17 ENCOUNTER — Ambulatory Visit (INDEPENDENT_AMBULATORY_CARE_PROVIDER_SITE_OTHER): Payer: BC Managed Care – PPO | Admitting: Family Medicine

## 2013-01-17 VITALS — BP 120/78 | HR 67 | Temp 98.2°F | Resp 16 | Ht 65.5 in | Wt 179.0 lb

## 2013-01-17 DIAGNOSIS — E781 Pure hyperglyceridemia: Secondary | ICD-10-CM

## 2013-01-17 DIAGNOSIS — Z01419 Encounter for gynecological examination (general) (routine) without abnormal findings: Secondary | ICD-10-CM

## 2013-01-17 DIAGNOSIS — E039 Hypothyroidism, unspecified: Secondary | ICD-10-CM

## 2013-01-17 DIAGNOSIS — R6889 Other general symptoms and signs: Secondary | ICD-10-CM

## 2013-01-17 LAB — LIPID PANEL
Cholesterol: 159 mg/dL (ref 0–200)
HDL: 68.9 mg/dL (ref >39.00)
LDL Cholesterol: 74 mg/dL (ref 0–99)
Total CHOL/HDL Ratio: 2
Triglycerides: 82 mg/dL (ref 0.0–149.0)
VLDL: 16.4 mg/dL (ref 0.0–40.0)

## 2013-01-17 LAB — HEPATIC FUNCTION PANEL
AST: 29 U/L (ref 0–37)
Bilirubin, Direct: 0.2 mg/dL (ref 0.0–0.3)
Total Bilirubin: 0.9 mg/dL (ref 0.3–1.2)
Total Protein: 6.9 g/dL (ref 6.0–8.3)

## 2013-01-17 LAB — CBC WITH DIFFERENTIAL/PLATELET
Basophils Absolute: 0 10*3/uL (ref 0.0–0.1)
Eosinophils Absolute: 0.1 10*3/uL (ref 0.0–0.7)
HCT: 40.9 % (ref 36.0–46.0)
Hemoglobin: 14.1 g/dL (ref 12.0–15.0)
Lymphs Abs: 1.6 10*3/uL (ref 0.7–4.0)
MCHC: 34.5 g/dL (ref 30.0–36.0)
Monocytes Absolute: 0.5 10*3/uL (ref 0.1–1.0)
Neutro Abs: 2.8 10*3/uL (ref 1.4–7.7)
RDW: 12.7 % (ref 11.5–14.6)

## 2013-01-17 LAB — BASIC METABOLIC PANEL
Calcium: 9.7 mg/dL (ref 8.4–10.5)
Creatinine, Ser: 0.9 mg/dL (ref 0.4–1.2)
GFR: 71.39 mL/min (ref >60.00)

## 2013-01-17 LAB — T3, FREE: T3, Free: 2.7 pg/mL (ref 2.3–4.2)

## 2013-01-17 MED ORDER — FENOFIBRATE 160 MG PO TABS
ORAL_TABLET | ORAL | Status: DC
Start: 2013-01-17 — End: 2014-03-03

## 2013-01-17 MED ORDER — OMEPRAZOLE 40 MG PO CPDR: 40.0000 mg | DELAYED_RELEASE_CAPSULE | Freq: Every day | ORAL | Status: DC

## 2013-01-17 NOTE — Assessment & Plan Note (Signed)
Chronic problem.  Check labs.  start meds prn.

## 2013-01-17 NOTE — Assessment & Plan Note (Signed)
Stop Lipitor due to leg cramps.  Continue fenofibrate.  Check labs.

## 2013-01-17 NOTE — Progress Notes (Signed)
  Subjective:    Patient ID: Tracy [name], female    DOB: 18-Mar-1950, 62 y.o.   MRN: [id number]  HPI Pre visit review using our clinic review tool, if applicable. No additional management support is needed unless otherwise documented below in the visit note.   CPE- UTD on colonoscopy, DEXA, mammo, pap.  Walking daily for 40 min daily.   Review of Systems Patient reports no vision/ hearing changes, adenopathy,fever, weight change,  persistant/recurrent hoarseness , swallowing issues, chest pain, palpitations, edema, persistant/recurrent cough, hemoptysis, dyspnea (rest/exertional/paroxysmal nocturnal), gastrointestinal bleeding (melena, rectal bleeding), abdominal pain, significant heartburn, bowel changes, GU symptoms (dysuria, hematuria, incontinence), Gyn symptoms (abnormal  bleeding, pain),  syncope, focal weakness, memory loss, numbness & tingling, skin/hair/nail changes, abnormal bruising or bleeding, anxiety, or depression.     Objective:   Physical Exam General Appearance:    Alert, cooperative, no distress, appears stated age  Head:    Normocephalic, without obvious abnormality, atraumatic  Eyes:    PERRL, conjunctiva/corneas clear, EOM's intact, fundi    benign, both eyes  Ears:    Normal TM's and external ear canals, both ears  Nose:   Nares normal, septum midline, mucosa normal, no drainage    or sinus tenderness  Throat:   Lips, mucosa, and tongue normal; teeth and gums normal  Neck:   Supple, symmetrical, trachea midline, no adenopathy;    Thyroid: no enlargement/tenderness/nodules  Back:     Symmetric, no curvature, ROM normal, no CVA tenderness  Lungs:     Clear to auscultation bilaterally, respirations unlabored  Chest Wall:    No tenderness or deformity   Heart:    Regular rate and rhythm, S1 and S2 normal, no murmur, rub   or gallop  Breast Exam:    Normal breast exam  Abdomen:     Soft, non-tender, bowel sounds active all four quadrants,    no masses, no  organomegaly  Genitalia:    Deferred  Rectal:    Extremities:   Extremities normal, atraumatic, no cyanosis or edema  Pulses:   2+ and symmetric all extremities  Skin:   Skin color, texture, turgor normal, no rashes or lesions  Lymph nodes:   Cervical, supraclavicular, and axillary nodes normal  Neurologic:   CNII-XII intact, normal strength, sensation and reflexes    throughout          Assessment & Plan:

## 2013-01-17 NOTE — Patient Instructions (Signed)
Follow up in 6 months to recheck cholesterol STOP the lipitor Continue the Fenofibrate Keep up the good work on healthy diet and regular exercise We'll notify you of your lab results and make any changes if needed Call with any questions or concerns Happy Holidays!!!

## 2013-01-17 NOTE — Assessment & Plan Note (Signed)
Pt's PE WNL.  UTD on health maintenance.  Check labs.  Anticipatory guidance provided.  

## 2013-01-21 LAB — VITAMIN D 1,25 DIHYDROXY
Vitamin D 1, 25 (OH)2 Total: 61 pg/mL (ref 18–72)
Vitamin D2 1, 25 (OH)2: <8 pg/mL

## 2013-02-02 ENCOUNTER — Other Ambulatory Visit: Payer: Self-pay | Admitting: Family Medicine

## 2013-02-04 NOTE — Telephone Encounter (Signed)
Omeprazole refilled per protocol 

## 2013-02-25 ENCOUNTER — Ambulatory Visit (INDEPENDENT_AMBULATORY_CARE_PROVIDER_SITE_OTHER): Payer: BC Managed Care – PPO | Admitting: Family Medicine

## 2013-02-25 ENCOUNTER — Encounter: Payer: Self-pay | Admitting: Family Medicine

## 2013-02-25 VITALS — BP 122/78 | HR 72 | Temp 98.1°F | Resp 16 | Wt 178.5 lb

## 2013-02-25 DIAGNOSIS — R319 Hematuria, unspecified: Secondary | ICD-10-CM

## 2013-02-25 DIAGNOSIS — R350 Frequency of micturition: Secondary | ICD-10-CM

## 2013-02-25 DIAGNOSIS — N390 Urinary tract infection, site not specified: Secondary | ICD-10-CM

## 2013-02-25 LAB — POCT URINALYSIS DIPSTICK
Bilirubin, UA: NEGATIVE
Glucose, UA: NEGATIVE
Spec Grav, UA: 1.015
Urobilinogen, UA: 0.2
pH, UA: 6

## 2013-02-25 MED ORDER — CEPHALEXIN 500 MG PO CAPS: 500.0000 mg | ORAL_CAPSULE | Freq: Two times a day (BID) | ORAL | Status: AC

## 2013-02-25 NOTE — Patient Instructions (Signed)
Follow up as needed Start the keflex twice daily Drink plenty of fluids Call with any questions or concerns Happy New Year!!!

## 2013-02-25 NOTE — Progress Notes (Signed)
Pre visit review using our clinic review tool, if applicable. No additional management support is needed unless otherwise documented below in the visit note. 

## 2013-02-25 NOTE — Addendum Note (Signed)
Addended by: Silvio Pate D on: 02/25/2013 02:35 PM   Modules accepted: Orders

## 2013-02-25 NOTE — Progress Notes (Signed)
   Subjective:    Patient ID: Tracy [name], female    DOB: 21-Oct-1950, 61 y.o.   MRN: [id number]  HPI UTI- recurrent problem for pt.  No fevers, back pain, suprapubic pain.  Did note blood this AM.  sxs started 2 days ago.  + hesitancy, urgency, frequency.  No dysuria.   Review of Systems For ROS see HPI     Objective:   Physical Exam  Vitals reviewed. Constitutional: She appears well-developed and well-nourished. No distress.  Abdominal: Soft. She exhibits no distension. There is no tenderness (no suprapubic or CVA tenderness).          Assessment & Plan:

## 2013-02-25 NOTE — Assessment & Plan Note (Signed)
Pt's sxs and UA consistent w/ infxn.  Start abx.  Reviewed supportive care and red flags that should prompt return.  Pt expressed understanding and is in agreement w/ plan.  

## 2013-02-27 LAB — URINE CULTURE: Colony Count: 30000

## 2013-03-20 ENCOUNTER — Ambulatory Visit (INDEPENDENT_AMBULATORY_CARE_PROVIDER_SITE_OTHER): Payer: BC Managed Care – PPO | Admitting: Internal Medicine

## 2013-03-20 ENCOUNTER — Encounter: Payer: Self-pay | Admitting: Internal Medicine

## 2013-03-20 VITALS — BP 136/75 | HR 73 | Temp 98.0°F | Wt 177.0 lb

## 2013-03-20 DIAGNOSIS — N390 Urinary tract infection, site not specified: Secondary | ICD-10-CM

## 2013-03-20 DIAGNOSIS — R350 Frequency of micturition: Secondary | ICD-10-CM

## 2013-03-20 LAB — POCT URINALYSIS DIPSTICK
Bilirubin, UA: NEGATIVE
Glucose, UA: NEGATIVE
KETONES UA: NEGATIVE
Nitrite, UA: NEGATIVE
Protein, UA: NEGATIVE
Spec Grav, UA: 1.01
Urobilinogen, UA: 0.2
pH, UA: 6

## 2013-03-20 MED ORDER — SULFAMETHOXAZOLE-TMP DS 800-160 MG PO TABS: 1.0000 | ORAL_TABLET | Freq: Two times a day (BID) | ORAL | Status: DC

## 2013-03-20 NOTE — Assessment & Plan Note (Signed)
Presents with symptoms consistent with a UTI, Udip supportive of the dx. She recently had 2 documented UTIs with Escherichia coli. Plan: Urine culture Bactrim Continue drinking plenty of fluids See PCP in a month for a followup, she is having some vaginal dryness, may need further eval or treatment  to prevent recurrent UTIs.

## 2013-03-20 NOTE — Progress Notes (Signed)
   Subjective:    Patient ID: Tracy [name], female    DOB: [birth year], 63 y.o.   MRN: [id number]  HPI Acute visit Was seen last month with UTI, treated with appropriate antibiotics; two days ago, urinary frequency restarted Along with mild bladder incontinence. She has frequent UTIs.  Past Medical History  Diagnosis Date  . Anxiety   . Thyroid disease     hypothyroidism  . Osteopenia   . Diverticulitis   . GERD (gastroesophageal reflux disease)   . Hyperlipidemia    Past Surgical History  Procedure Laterality Date  . Knee surgery    . Back surgery    . Laparoscopic liver cyst fenestration    . Foot surgery    . Cholecystectomy    . Tonsillectomy      Review of Systems Denies fever or chills No dysuria or gross hematuria although state her urine has a  intensely yellow color. No nausea, vomiting, flank pain. She admits to some vaginal dryness, she has not been sexually active lately.    Objective:   Physical Exam BP 136/75  Pulse 73  Temp(Src) 98 F (36.7 C)  Wt 177 lb (80.287 kg)  SpO2 98% General -- alert, well-developed, NAD.   Abdomen-- Not distended, good bowel sounds,soft, slt tender at the lower abdomen B w/o mass or rebound.  Extremities-- no pretibial edema bilaterally  Neurologic--  alert & oriented X3. Speech normal, gait normal, strength normal in all extremities.  Psych-- Cognition and judgment appear intact. Cooperative with normal attention span and concentration. No anxious or depressed appearing.      Assessment & Plan:

## 2013-03-20 NOTE — Patient Instructions (Signed)
Drink plenty of fluids Take the antibiotic twice a day for one week Please call if not gradually improving Please schedule a visit to see your primary doctor In 4 weeks for a re evaluation

## 2013-03-20 NOTE — Progress Notes (Signed)
Pre visit review using our clinic review tool, if applicable. No additional management support is needed unless otherwise documented below in the visit note. 

## 2013-03-21 ENCOUNTER — Encounter: Payer: Self-pay | Admitting: Internal Medicine

## 2013-03-21 LAB — URINALYSIS, ROUTINE W REFLEX MICROSCOPIC
BILIRUBIN URINE: NEGATIVE
Ketones, ur: NEGATIVE
NITRITE: NEGATIVE
Specific Gravity, Urine: 1.015 (ref 1.000–1.030)
Total Protein, Urine: NEGATIVE
Urine Glucose: NEGATIVE
Urobilinogen, UA: 0.2 (ref 0.0–1.0)
pH: 6 (ref 5.0–8.0)

## 2013-03-22 LAB — URINE CULTURE

## 2013-07-16 ENCOUNTER — Encounter: Payer: Self-pay | Admitting: Family Medicine

## 2013-07-16 ENCOUNTER — Ambulatory Visit (INDEPENDENT_AMBULATORY_CARE_PROVIDER_SITE_OTHER): Payer: Medicare Other | Admitting: Family Medicine

## 2013-07-16 VITALS — BP 120/76 | HR 61 | Temp 98.2°F | Resp 16 | Wt 174.2 lb

## 2013-07-16 DIAGNOSIS — E781 Pure hyperglyceridemia: Secondary | ICD-10-CM

## 2013-07-16 DIAGNOSIS — E039 Hypothyroidism, unspecified: Secondary | ICD-10-CM

## 2013-07-16 DIAGNOSIS — Z23 Encounter for immunization: Secondary | ICD-10-CM

## 2013-07-16 DIAGNOSIS — Z0184 Encounter for antibody response examination: Secondary | ICD-10-CM

## 2013-07-16 LAB — BASIC METABOLIC PANEL
BUN: 22 mg/dL (ref 6–23)
CALCIUM: 9.9 mg/dL (ref 8.4–10.5)
CO2: 27 mEq/L (ref 19–32)
Chloride: 106 mEq/L (ref 96–112)
Creatinine, Ser: 0.9 mg/dL (ref 0.4–1.2)
GFR: 70.32 mL/min (ref >60.00)
GLUCOSE: 82 mg/dL (ref 70–99)
Potassium: 4.1 mEq/L (ref 3.5–5.1)
SODIUM: 141 meq/L (ref 135–145)

## 2013-07-16 LAB — HEPATIC FUNCTION PANEL
ALT: 24 U/L (ref 0–35)
AST: 29 U/L (ref 0–37)
Albumin: 4.2 g/dL (ref 3.5–5.2)
Alkaline Phosphatase: 76 U/L (ref 39–117)
BILIRUBIN DIRECT: 0.1 mg/dL (ref 0.0–0.3)
BILIRUBIN TOTAL: 0.6 mg/dL (ref 0.2–1.2)
TOTAL PROTEIN: 7.1 g/dL (ref 6.0–8.3)

## 2013-07-16 LAB — LIPID PANEL
CHOL/HDL RATIO: 3
Cholesterol: 203 mg/dL — ABNORMAL HIGH (ref 0–200)
HDL: 66.8 mg/dL (ref >39.00)
LDL Cholesterol: 111 mg/dL — ABNORMAL HIGH (ref 0–99)
Triglycerides: 126 mg/dL (ref 0.0–149.0)
VLDL: 25.2 mg/dL (ref 0.0–40.0)

## 2013-07-16 LAB — TSH: TSH: 0.16 u[IU]/mL — ABNORMAL LOW (ref 0.35–4.50)

## 2013-07-16 NOTE — Patient Instructions (Addendum)
Schedule your complete physical in 6 months We'll notify you of your lab results and make any changes if needed We'll let you know if you need a shingles shot Keep up the good work!  You look great! Call with any questions or concerns Happy Summer!!!

## 2013-07-16 NOTE — Assessment & Plan Note (Signed)
Chronic problem.  On Fenofibrate daily.  Statin was stopped 6 months ago due to leg cramps.  Check labs.  Adjust meds prn

## 2013-07-16 NOTE — Progress Notes (Signed)
Pre visit review using our clinic review tool, if applicable. No additional management support is needed unless otherwise documented below in the visit note. 

## 2013-07-16 NOTE — Progress Notes (Signed)
   Subjective:    Patient ID: Tracy [name], female    DOB: 1950-04-26, 63 y.o.   MRN: [id number]  HPI Hypertriglyceridemia- chronic problem, on Fenofibrate.  Stopped the Atorvastatin 6 months ago due to leg cramps.  Pt continues to cramp but these have decreased in frequency.  Pt walking regularly.  Hypothyroid- TSH was low at last check but T3 and T4 were normal.  Pt not currently on meds.     Review of Systems For ROS see HPI     Objective:   Physical Exam  Vitals reviewed. Constitutional: She is oriented to person, place, and time. She appears well-developed and well-nourished. No distress.  HENT:  Head: Normocephalic and atraumatic.  Eyes: Conjunctivae and EOM are normal. Pupils are equal, round, and reactive to light.  Neck: Normal range of motion. Neck supple. No thyromegaly present.  Cardiovascular: Normal rate, regular rhythm, normal heart sounds and intact distal pulses.   No murmur heard. Pulmonary/Chest: Effort normal and breath sounds normal. No respiratory distress.  Abdominal: Soft. She exhibits no distension. There is no tenderness.  Musculoskeletal: She exhibits no edema.  Lymphadenopathy:    She has no cervical adenopathy.  Neurological: She is alert and oriented to person, place, and time.  Skin: Skin is warm and dry.  Psychiatric: She has a normal mood and affect. Her behavior is normal.          Assessment & Plan:

## 2013-07-16 NOTE — Assessment & Plan Note (Signed)
Chronic problem.  Not currently on meds.  TSH was abnormal at last check but T3/T4 were normal.  Repeat labs and start meds prn.

## 2013-07-16 NOTE — Addendum Note (Signed)
Addended by: Kris Hartmann on: 07/16/2013 10:28 AM   Modules accepted: Orders

## 2013-07-17 ENCOUNTER — Ambulatory Visit: Payer: Medicare Other

## 2013-07-17 DIAGNOSIS — R946 Abnormal results of thyroid function studies: Secondary | ICD-10-CM

## 2013-07-17 LAB — T3, FREE: T3 FREE: 2.9 pg/mL (ref 2.3–4.2)

## 2013-07-17 LAB — VARICELLA ZOSTER ANTIBODY, IGG: Varicella IgG: 1027 Index — ABNORMAL HIGH (ref <135.00)

## 2013-07-17 LAB — T4, FREE: Free T4: 1.11 ng/dL (ref 0.60–1.60)

## 2013-09-24 ENCOUNTER — Telehealth: Payer: Self-pay | Admitting: *Deleted

## 2013-09-24 ENCOUNTER — Ambulatory Visit (INDEPENDENT_AMBULATORY_CARE_PROVIDER_SITE_OTHER): Payer: Medicare Other | Admitting: Nurse Practitioner

## 2013-09-24 ENCOUNTER — Encounter: Payer: Self-pay | Admitting: Nurse Practitioner

## 2013-09-24 VITALS — BP 102/78 | HR 72 | Temp 98.0°F | Ht 64.0 in | Wt 173.5 lb

## 2013-09-24 DIAGNOSIS — R3915 Urgency of urination: Secondary | ICD-10-CM

## 2013-09-24 DIAGNOSIS — N390 Urinary tract infection, site not specified: Secondary | ICD-10-CM

## 2013-09-24 DIAGNOSIS — N952 Postmenopausal atrophic vaginitis: Secondary | ICD-10-CM

## 2013-09-24 LAB — POCT URINALYSIS DIPSTICK
Bilirubin, UA: NEGATIVE
Glucose, UA: NEGATIVE
KETONES UA: NEGATIVE
Nitrite, UA: NEGATIVE
PH UA: 6.5
Protein, UA: NEGATIVE
Spec Grav, UA: <=1.005
Urobilinogen, UA: 0.2

## 2013-09-24 MED ORDER — ESTROGENS, CONJUGATED 0.625 MG/GM VA CREA: 1.0000 | TOPICAL_CREAM | Freq: Every day | VAGINAL | Status: DC

## 2013-09-24 MED ORDER — SULFAMETHOXAZOLE-TMP DS 800-160 MG PO TABS: 1.0000 | ORAL_TABLET | Freq: Two times a day (BID) | ORAL | Status: DC

## 2013-09-24 MED ORDER — ESTROGENS, CONJUGATED 0.625 MG/GM VA CREA: TOPICAL_CREAM | VAGINAL | Status: DC

## 2013-09-24 MED ORDER — PHENAZOPYRIDINE HCL 200 MG PO TABS: 200.0000 mg | ORAL_TABLET | Freq: Three times a day (TID) | ORAL | Status: DC | PRN

## 2013-09-24 NOTE — Progress Notes (Signed)
Pre visit review using our clinic review tool, if applicable. No additional management support is needed unless otherwise documented below in the visit note. 

## 2013-09-24 NOTE — Telephone Encounter (Signed)
Patient left vm wanting to know how much premarin she needs to use with each application? Patient said directions read "as directed" but directions did not say how much. Please advise?

## 2013-09-24 NOTE — Telephone Encounter (Signed)
Patient notified. Patient expressed understanding.  

## 2013-09-24 NOTE — Patient Instructions (Signed)
Start antibiotic. Take pyridium to relax bladder, caution: urine tears & sweat will be orange. Do not be alarmed! Sip hydrating fluids (water, juice, colorless soda, decaff tea) every hour to flush kidneys. Return in 2 month to recheck urine and evaluate efficacy of estrogen cream.   Urinary Tract Infection Urinary tract infections (UTIs) can develop anywhere along your urinary tract. Your urinary tract is your body's drainage system for removing wastes and extra water. Your urinary tract includes two kidneys, two ureters, a bladder, and a urethra. Your kidneys are a pair of bean-shaped organs. Each kidney is about the size of your fist. They are located below your ribs, one on each side of your spine. CAUSES Infections are caused by microbes, which are microscopic organisms, including fungi, viruses, and bacteria. These organisms are so small that they can only be seen through a microscope. Bacteria are the microbes that most commonly cause UTIs. SYMPTOMS  Symptoms of UTIs may vary by age and gender of the patient and by the location of the infection. Symptoms in young women typically include a frequent and intense urge to urinate and a painful, burning feeling in the bladder or urethra during urination. Older women and men are more likely to be tired, shaky, and weak and have muscle aches and abdominal pain. A fever may mean the infection is in your kidneys. Other symptoms of a kidney infection include pain in your back or sides below the ribs, nausea, and vomiting. DIAGNOSIS To diagnose a UTI, your caregiver will ask you about your symptoms. Your caregiver also will ask to provide a urine sample. The urine sample will be tested for bacteria and white blood cells. White blood cells are made by your body to help fight infection. TREATMENT  Typically, UTIs can be treated with medication. Because most UTIs are caused by a bacterial infection, they usually can be treated with the use of antibiotics. The  choice of antibiotic and length of treatment depend on your symptoms and the type of bacteria causing your infection. HOME CARE INSTRUCTIONS  If you were prescribed antibiotics, take them exactly as your caregiver instructs you. Finish the medication even if you feel better after you have only taken some of the medication.  Drink enough water and fluids to keep your urine clear or pale yellow.  Avoid caffeine, tea, and carbonated beverages. They tend to irritate your bladder.  Empty your bladder often. Avoid holding urine for long periods of time.  Empty your bladder before and after sexual intercourse.  After a bowel movement, women should cleanse from front to back. Use each tissue only once. SEEK MEDICAL CARE IF:   You have back pain.  You develop a fever.  Your symptoms do not begin to resolve within 3 days. SEEK IMMEDIATE MEDICAL CARE IF:   You have severe back pain or lower abdominal pain.  You develop chills.  You have nausea or vomiting.  You have continued burning or discomfort with urination. MAKE SURE YOU:   Understand these instructions.  Will watch your condition.  Will get help right away if you are not doing well or get worse. Document Released: 11/24/2004 Document Revised: 08/16/2011 Document Reviewed: 03/25/2011 [facility] Patient Information 2014 [place].  Atrophic Vaginitis Atrophic vaginitis is a problem of low levels of estrogen in women. This problem can happen at any age. It is most common in women who have gone through menopause ("the change").  HOW WILL I KNOW IF I HAVE THIS PROBLEM? You may have:  Trouble with peeing (urinating), such as:  Going to the bathroom often.  A hard time holding your pee until you reach a bathroom.  Leaking pee.  Having pain when you pee.  Itching or a burning feeling.  Vaginal bleeding and spotting.  Pain during sex.  Dryness of the vagina.  A yellow, bad-smelling fluid (discharge) coming from  the vagina. HOW WILL MY DOCTOR CHECK FOR THIS PROBLEM?  During your exam, your doctor will likely find the problem.  If there is a vaginal fluid, it may be checked for infection. HOW WILL THIS PROBLEM BE TREATED? Keep the vulvar skin as clean as possible. Moisturizers and lubricants can help with some of the symptoms. Estrogen replacement can help. There are 2 ways to take estrogen:  Systemic estrogen gets estrogen to your whole body. It takes many weeks or months before the symptoms get better.  You take an estrogen pill.  You use a skin patch. This is a patch that you put on your skin.  If you still have your uterus, your doctor may ask you to take a hormone. Talk to your doctor about the right medicine for you.  Estrogen cream.  This puts estrogen only at the part of your body where you apply it. The cream is put into the vagina or put on the vulvar skin. For some women, estrogen cream works faster than pills or the patch. CAN ALL WOMEN WITH THIS PROBLEM USE ESTROGEN? No. Women with certain types of cancer, liver problems, or problems with blood clots should not take estrogen. Your doctor can help you decide the best treatment for your symptoms. Document Released: 08/03/2007 Document Revised: 02/19/2013 Document Reviewed: 08/03/2007 [facility] Patient Information 2015 [place], [place]. This information is not intended to replace advice given to you by your health care provider. Make sure you discuss any questions you have with your health care provider.

## 2013-09-24 NOTE — Progress Notes (Signed)
   Subjective:    Patient ID: Tracy [name], female    DOB: 30-Nov-1950, 63 y.o.   MRN: [id number]  Urinary Tract Infection  This is a recurrent problem. The current episode started in the past 7 days. The problem occurs every urination. The problem has been gradually worsening. The quality of the pain is described as burning. The pain is mild. There has been no fever. Associated symptoms include frequency and urgency. Pertinent negatives include no chills, discharge, flank pain, hematuria, nausea or vomiting. Associated symptoms comments: Also c/o frequent burning sensation in vagina & feeling "bone dry".. She has tried increased fluids (cranberry tabs) for the symptoms. The treatment provided no relief. Her past medical history is significant for recurrent UTIs.      Review of Systems  Constitutional: Negative for fever, chills and fatigue.  Gastrointestinal: Negative for nausea, vomiting and abdominal pain.  Genitourinary: Positive for dysuria, urgency, frequency and vaginal pain (vaginal irritation & dryness.). Negative for hematuria and flank pain.       Objective:   Physical Exam  Vitals reviewed. Constitutional: She is oriented to person, place, and time. She appears well-developed and well-nourished. No distress.  HENT:  Head: Normocephalic and atraumatic.  Eyes: Conjunctivae are normal. Right eye exhibits no discharge. Left eye exhibits no discharge.  Cardiovascular: Normal rate.   Pulmonary/Chest: Effort normal. No respiratory distress.  Abdominal: Soft. Bowel sounds are normal. She exhibits no distension and no mass. There is tenderness (suprapubic tenderness). There is no rebound and no guarding.  Neurological: She is alert and oriented to person, place, and time.  Skin: Skin is warm and dry.  Psychiatric: She has a normal mood and affect. Her behavior is normal. Thought content normal.          Assessment & Plan:  1. Urinary urgency - POCT urinalysis dipstick-mod  blood, leuks - sulfamethoxazole-trimethoprim (BACTRIM DS) 800-160 MG per tablet; Take 1 tablet by mouth 2 (two) times daily.  Dispense: 6 tablet; Refill: 0 - phenazopyridine (PYRIDIUM) 200 MG tablet; Take 1 tablet (200 mg total) by mouth 3 (three) times daily as needed for pain.  Dispense: 6 tablet; Refill: 0  2. Frequent UTI Possibly r/t atrophic vaginitis - conjugated estrogens (PREMARIN) vaginal cream; Apply vaginally 2-3 times weekly as needed for vaginal irritation.  Dispense: 42.5 g; Refill: 3  3. Atrophic vaginitis - conjugated estrogens (PREMARIN) vaginal cream; Apply vaginally 2-3 times weekly as needed for vaginal irritation.  Dispense: 42.5 g; Refill: 3  See pt instructions. F/u 2 mos.

## 2013-09-24 NOTE — Addendum Note (Signed)
Addended by: Jannette Spanner on: 09/24/2013 11:00 AM   Modules accepted: Orders

## 2013-09-24 NOTE — Telephone Encounter (Signed)
I think it comes with applicator-if so, use 1 applicator each time. If no applicator, use quarter-sized amount each time.

## 2013-09-25 LAB — URINE CULTURE
Colony Count: NO GROWTH
Organism ID, Bacteria: NO GROWTH

## 2013-10-07 ENCOUNTER — Encounter: Payer: Self-pay | Admitting: Family Medicine

## 2013-10-07 ENCOUNTER — Ambulatory Visit (INDEPENDENT_AMBULATORY_CARE_PROVIDER_SITE_OTHER): Payer: Medicare Other | Admitting: Family Medicine

## 2013-10-07 VITALS — BP 108/60 | HR 73 | Temp 97.6°F | Wt 171.8 lb

## 2013-10-07 DIAGNOSIS — N952 Postmenopausal atrophic vaginitis: Secondary | ICD-10-CM | POA: Insufficient documentation

## 2013-10-07 NOTE — Progress Notes (Signed)
   Subjective:    Patient ID: Tracy [name], female    DOB: [birth year], 63 y.o.   MRN: [id number]  HPI Atrophic Vaginitis- dx'd at last visit w/ [facility].  Was started on Premarin cream.  Pt initially was struggling w/ recurrent UTI vs yeast infxn.  Pt feels sxs are improving when she uses cream but had been limiting use to 0.5mg  weekly w/ breakthrough sxs.  Pt then increased to M/W/F with good relief of symptoms.  Pt fears the possibility of Uterine Cancer as she read in the package insert.   Review of Systems For ROS see HPI     Objective:   Physical Exam  Vitals reviewed. Constitutional: She is oriented to person, place, and time. She appears well-developed and well-nourished. No distress.  Neurological: She is alert and oriented to person, place, and time.  Psychiatric: She has a normal mood and affect. Her behavior is normal. Judgment and thought content normal.  Mildly anxious          Assessment & Plan:

## 2013-10-07 NOTE — Progress Notes (Signed)
Pre visit review using our clinic review tool, if applicable. No additional management support is needed unless otherwise documented below in the visit note. 

## 2013-10-07 NOTE — Assessment & Plan Note (Signed)
New to provider.  Agree w/ dx and treatment plan discussed at last visit but pt didn't feel she got enough specifics as to how much and how often to use medication.  Spoke w/ GYN who agreed that 0.5 gm dose 3x/week could be used safely w/o progestin.  Also discussed that there was no particular recommended screening tool for endometrial cancer- random bx, [name], etc.  Pt felt better at end of visit- questions were answered.  Will follow.

## 2013-11-26 ENCOUNTER — Ambulatory Visit (INDEPENDENT_AMBULATORY_CARE_PROVIDER_SITE_OTHER): Payer: Medicare Other | Admitting: Family Medicine

## 2013-11-26 ENCOUNTER — Other Ambulatory Visit (INDEPENDENT_AMBULATORY_CARE_PROVIDER_SITE_OTHER): Payer: Medicare Other

## 2013-11-26 ENCOUNTER — Encounter: Payer: Self-pay | Admitting: Family Medicine

## 2013-11-26 ENCOUNTER — Ambulatory Visit: Payer: Medicare Other

## 2013-11-26 VITALS — BP 124/80 | HR 72 | Temp 97.8°F | Resp 17 | Wt 169.1 lb

## 2013-11-26 DIAGNOSIS — R5381 Other malaise: Secondary | ICD-10-CM

## 2013-11-26 DIAGNOSIS — R7989 Other specified abnormal findings of blood chemistry: Secondary | ICD-10-CM

## 2013-11-26 DIAGNOSIS — R5383 Other fatigue: Principal | ICD-10-CM

## 2013-11-26 DIAGNOSIS — J019 Acute sinusitis, unspecified: Secondary | ICD-10-CM | POA: Insufficient documentation

## 2013-11-26 DIAGNOSIS — J0100 Acute maxillary sinusitis, unspecified: Secondary | ICD-10-CM

## 2013-11-26 LAB — BASIC METABOLIC PANEL
BUN: 21 mg/dL (ref 6–23)
CHLORIDE: 107 meq/L (ref 96–112)
CO2: 26 mEq/L (ref 19–32)
Calcium: 9.7 mg/dL (ref 8.4–10.5)
Creatinine, Ser: 0.8 mg/dL (ref 0.4–1.2)
GFR: 72.18 mL/min (ref >60.00)
Glucose, Bld: 96 mg/dL (ref 70–99)
POTASSIUM: 3.9 meq/L (ref 3.5–5.1)
Sodium: 140 mEq/L (ref 135–145)

## 2013-11-26 LAB — CBC WITH DIFFERENTIAL/PLATELET
BASOS PCT: 0.3 % (ref 0.0–3.0)
Basophils Absolute: 0 10*3/uL (ref 0.0–0.1)
EOS ABS: 0.1 10*3/uL (ref 0.0–0.7)
Eosinophils Relative: 2 % (ref 0.0–5.0)
HEMATOCRIT: 42.4 % (ref 36.0–46.0)
HEMOGLOBIN: 14.3 g/dL (ref 12.0–15.0)
LYMPHS PCT: 28.2 % (ref 12.0–46.0)
Lymphs Abs: 1.8 10*3/uL (ref 0.7–4.0)
MCHC: 33.8 g/dL (ref 30.0–36.0)
MCV: 91.5 fl (ref 78.0–100.0)
MONO ABS: 0.5 10*3/uL (ref 0.1–1.0)
Monocytes Relative: 8.2 % (ref 3.0–12.0)
NEUTROS ABS: 4 10*3/uL (ref 1.4–7.7)
Neutrophils Relative %: 61.3 % (ref 43.0–77.0)
Platelets: 230 10*3/uL (ref 150.0–400.0)
RBC: 4.63 Mil/uL (ref 3.87–5.11)
RDW: 12.7 % (ref 11.5–15.5)
WBC: 6.5 10*3/uL (ref 4.0–10.5)

## 2013-11-26 LAB — T3, FREE: T3, Free: 2.9 pg/mL (ref 2.3–4.2)

## 2013-11-26 LAB — TSH: TSH: 0.17 u[IU]/mL — AB (ref 0.35–4.50)

## 2013-11-26 LAB — T4, FREE: Free T4: 1.2 ng/dL (ref 0.60–1.60)

## 2013-11-26 MED ORDER — AMOXICILLIN 875 MG PO TABS: 875.0000 mg | ORAL_TABLET | Freq: Two times a day (BID) | ORAL | Status: DC

## 2013-11-26 NOTE — Assessment & Plan Note (Signed)
New.  Given short duration and pt's current illness, suspect this is due to her sinus infxn.  However, due to hx of abnormal TSH will get labs to assess for underlying cause.  Reviewed supportive care and red flags that should prompt return.  Pt expressed understanding and is in agreement w/ plan.

## 2013-11-26 NOTE — Progress Notes (Signed)
   Subjective:    Patient ID: Tracy [name], female    DOB: June 06, 1950, 63 y.o.   MRN: [id number]  HPI Fatigue- 'no energy' for 5 days.  + joint pain.  Sat in rain at football game on Saturday.  Tm 'a little over a hundred' yesterday.  Fever broke overnight, pt woke up 'covered in sweat'.  No cough.  + PND.  + sinus pain/pressure.  + tooth pain.  + HA.  No N/V.  No known sick contacts.   Review of Systems For ROS see HPI     Objective:   Physical Exam  Vitals reviewed. Constitutional: She appears well-developed and well-nourished. No distress.  HENT:  Head: Normocephalic and atraumatic.  Right Ear: Tympanic membrane normal.  Left Ear: Tympanic membrane normal.  Nose: Mucosal edema and rhinorrhea present. Right sinus exhibits maxillary sinus tenderness and frontal sinus tenderness. Left sinus exhibits maxillary sinus tenderness and frontal sinus tenderness.  Mouth/Throat: Uvula is midline and mucous membranes are normal. Posterior oropharyngeal erythema present. No oropharyngeal exudate.  Eyes: Conjunctivae and EOM are normal. Pupils are equal, round, and reactive to light.  Neck: Normal range of motion. Neck supple.  Cardiovascular: Normal rate, regular rhythm and normal heart sounds.   Pulmonary/Chest: Effort normal and breath sounds normal. No respiratory distress. She has no wheezes.  Lymphadenopathy:    She has no cervical adenopathy.          Assessment & Plan:

## 2013-11-26 NOTE — Assessment & Plan Note (Signed)
New.  Suspect this is cause of pt's fatigue.  Hx and PE consistent w/ infxn.  Start abx.  Reviewed supportive care and red flags that should prompt return.  Pt expressed understanding and is in agreement w/ plan.

## 2013-11-26 NOTE — Patient Instructions (Signed)
Follow up as scheduled for your physical- sooner if needed Start the Amoxicillin twice daily- take w/ food- for the sinus infection Drink plenty of fluids REST! Alternate tylenol/ibuprofen for pain/fever We'll notify you of your lab results and make any changes if needed You can get your flu shot after you complete your 10 day course of antibiotics Call with any questions or concerns Hang in there!

## 2013-11-26 NOTE — Progress Notes (Signed)
Pre visit review using our clinic review tool, if applicable. No additional management support is needed unless otherwise documented below in the visit note. 

## 2013-11-27 ENCOUNTER — Other Ambulatory Visit: Payer: Self-pay | Admitting: Family Medicine

## 2013-11-27 DIAGNOSIS — R7989 Other specified abnormal findings of blood chemistry: Secondary | ICD-10-CM

## 2013-12-06 ENCOUNTER — Encounter: Payer: Self-pay | Admitting: Family Medicine

## 2013-12-09 ENCOUNTER — Ambulatory Visit (INDEPENDENT_AMBULATORY_CARE_PROVIDER_SITE_OTHER): Payer: Medicare Other

## 2013-12-09 DIAGNOSIS — Z23 Encounter for immunization: Secondary | ICD-10-CM

## 2013-12-13 ENCOUNTER — Other Ambulatory Visit: Payer: Self-pay

## 2013-12-13 ENCOUNTER — Ambulatory Visit (INDEPENDENT_AMBULATORY_CARE_PROVIDER_SITE_OTHER): Payer: Medicare Other | Admitting: Internal Medicine

## 2013-12-13 ENCOUNTER — Encounter: Payer: Self-pay | Admitting: Internal Medicine

## 2013-12-13 VITALS — BP 114/68 | HR 71 | Temp 97.8°F | Resp 12 | Ht 64.25 in | Wt 171.0 lb

## 2013-12-13 DIAGNOSIS — E0590 Thyrotoxicosis, unspecified without thyrotoxic crisis or storm: Secondary | ICD-10-CM

## 2013-12-13 NOTE — Progress Notes (Signed)
Patient ID: Tracy [name], female   DOB: [birth year], 63 y.o.   MRN: [id number]   HPI  Tracy [name] is a 63 y.o.-year-old female, referred by her PCP, Dr. Birdie [name], in consultation for subclinical hyperthyroidism. She is here with her husband, who offers part of the history.  She was dx'ed with hyperthyroidism in 2009 >> seen by Dr Tracy [name] >> followed by labs, no tx needed.  I reviewed pt's thyroid tests: Lab Results  Component Value Date   TSH 0.17* 11/26/2013   TSH 0.16* 07/16/2013   TSH 0.32* 01/17/2013   TSH 4.16 03/08/2012   TSH 0.32* 01/16/2012   TSH 0.740 05/05/2009   FREET4 1.20 11/26/2013   FREET4 1.11 07/17/2013   FREET4 1.16 01/17/2013    Pt denies feeling nodules in neck, hoarseness, dysphagia/odynophagia, SOB with lying down; she c/o: - + fatigue - + irritability - + excessive sweating/heat intolerance - at night - + feeling cold - no tremors - + anxiety - + palpitations - hyperdefecation - + weight loss - believes she lost 8 lbs in several months  Pt does have a FH of thyroid ds.: sister No FH of thyroid cancer. No h/o radiation tx to head or neck.  No seaweed or kelp, no recent contrast studies. No steroid use. No herbal supplements.   I reviewed her chart and she also has a history of OA - pain in shoulders, hip, knees; osteopenia; anxiety  ROS: Constitutional: see HPT, nocturia Eyes: no blurry vision, no xerophthalmia ENT: no sore throat, no nodules palpated in throat, no dysphagia/odynophagia, no hoarseness Cardiovascular: no CP/SOB/+ palpitations/leg swelling Respiratory: no cough/SOB Gastrointestinal: no N/V/D/C, + heartburn Musculoskeletal: + both: muscle/joint aches Skin: no rashes Neurological: no tremors/numbness/tingling/dizziness Psychiatric: no depression/+ anxiety + low libido  Past Medical History  Diagnosis Date  . Anxiety   . Thyroid disease     hypothyroidism  . Osteopenia   . Diverticulitis   . GERD (gastroesophageal reflux disease)    . Hyperlipidemia    Past Surgical History  Procedure Laterality Date  . Knee surgery    . Back surgery    . Laparoscopic liver cyst fenestration    . Foot surgery    . Cholecystectomy    . Tonsillectomy     History   Social History  . Marital Status: Married    Spouse Name: N/A    Number of Children: 2   Occupational History  . retired   Social History Main Topics  . Smoking status: Never Smoker   . Smokeless tobacco: Not on file  . Alcohol Use: vodka or wine 1-2x a week Yes  . Drug Use: No  Exercises by walking 5x a week.  Current Outpatient Prescriptions on File Prior to Visit  Medication Sig Dispense Refill  . amoxicillin (AMOXIL) 875 MG tablet Take 1 tablet (875 mg total) by mouth 2 (two) times daily.  20 tablet  0  . Calcium Carbonate (CALCIUM 600 PO) Take by mouth. Take 2 daily        . Cholecalciferol (VITAMIN D3) 2000 UNITS TABS Take by mouth.      . conjugated estrogens (PREMARIN) vaginal cream Apply vaginally 2-3 times weekly as needed for vaginal irritation.  42.5 g  3  . Cranberry (SM CRANBERRY) 300 MG tablet Take 300 mg by mouth 3 (three) times daily.      . fenofibrate 160 MG tablet TAKE 1 TABLET (160 MG TOTAL) BY MOUTH DAILY.  90 tablet  3  . Multiple Vitamin (  MULTIVITAMIN) capsule Take 1 capsule by mouth daily.        . nitrofurantoin (MACRODANTIN) 100 MG capsule Take 100 mg by mouth 3 (three) times daily.      Marland Kitchen omeprazole (PRILOSEC) 40 MG capsule Take 1 capsule (40 mg total) by mouth daily.  90 capsule  3  . phenazopyridine (PYRIDIUM) 200 MG tablet Take 1 tablet (200 mg total) by mouth 3 (three) times daily as needed for pain.  6 tablet  0  . sulfamethoxazole-trimethoprim (BACTRIM DS) 800-160 MG per tablet Take 1 tablet by mouth 2 (two) times daily.  6 tablet  0   No current facility-administered medications on file prior to visit.   Allergies  Allergen Reactions  . Demerol [Meperidine] Nausea And Vomiting   Family History  Problem Relation Age of  Onset  . Hypertension Mother   . Stroke Mother   . Cancer Neg Hx   . Diabetes Neg Hx    PE: BP 114/68  Pulse 71  Temp(Src) 97.8 F (36.6 C) (Oral)  Resp 12  Ht 5' 4.25" (1.632 m)  Wt 171 lb (77.565 kg)  BMI 29.12 kg/m2  SpO2 97% Wt Readings from Last 3 Encounters:  12/13/13 171 lb (77.565 kg)  11/26/13 169 lb 2 oz (76.715 kg)  10/07/13 171 lb 12.8 oz (77.928 kg)   Constitutional: overweight, in NAD Eyes: PERRLA, EOMI, no exophthalmos, no lid lag, no stare ENT: moist mucous membranes, no thyromegaly, no thyroid bruits, no cervical lymphadenopathy Cardiovascular: RRR, No MRG Respiratory: CTA B Gastrointestinal: abdomen soft, NT, ND, BS+ Musculoskeletal: no deformities, strength intact in all 4 Skin: moist, warm, no rashes Neurological: no tremor with outstretched hands, DTR normal in all 4  ASSESSMENT: 1. Subclinical hyperthyroidism  PLAN:  1. Patient with a history of mildly low TSH levels, with normal free T4 and free T3, with several possible thyrotoxic sxs: weight loss, occasional palpitations, fatigue. - she does not appear to have exogenous causes for the low TSH.  - We discussed that possible causes of thyrotoxicosis are:  Graves ds   Thyroiditis toxic multinodular goiter/ toxic adenoma (I cannot feel nodules at palpation of her thyroid). - since she had labs recently (which reviewed together), I suggested that we check the TSH, fT3 and fT4 and also had thyroid stimulating antibodies to screen for Graves' disease in 2 months. At that time, if the TSH is the same or lower, 4 if the free T4 and free T3 are higher, I will obtain an uptake and scan to differentiate between the 3 above possible etiologies. - we discussed about possible modalities of treatment for the above conditions, to include methimazole use or radioactive iodine ablation. If the TSH decreases more and the thyroid scan does not show a toxic adenoma, we can possibly try low dose of methimazole to see if  her symptoms improve. If the thyroid scan shows a toxic adenoma >> we can use a low dose RAI to ablate it. - I do not feel that we need to add beta blockers at this time, since she is not tachycardic, anxious, or tremulous - I advised her to join my chart to communicate easier - RTC in 4 months, but sooner for repeat labs

## 2013-12-13 NOTE — Patient Instructions (Signed)
You have subclinical (meaning mild) hyperthyroidism.  Please return in 2 months for labs and in 4 months for a visit.  Please let me know if you develop more of the following symptoms:  Hyperthyroidism The thyroid is a large gland located in the lower front part of your neck. The thyroid helps control metabolism. Metabolism is how your body uses food. It controls metabolism with the hormone thyroxine. When the thyroid is overactive, it produces too much hormone. When this happens, these following problems may occur:   Nervousness  Heat intolerance  Weight loss (in spite of increase food intake)  Diarrhea  Change in hair or skin texture  Palpitations (heart skipping or having extra beats)  Tachycardia (rapid heart rate)  Loss of menstruation (amenorrhea)  Shaking of the hands CAUSES  Grave's Disease (the immune system attacks the thyroid gland). This is the most common cause.  Inflammation of the thyroid gland.  Tumor (usually benign) in the thyroid gland or elsewhere.  Excessive use of thyroid medications (both prescription and 'natural').  Excessive ingestion of Iodine. DIAGNOSIS  To prove hyperthyroidism, your caregiver may do blood tests and ultrasound tests. Sometimes the signs are hidden. It may be necessary for your caregiver to watch this illness with blood tests, either before or after diagnosis and treatment. TREATMENT Short-term treatment There are several treatments to control symptoms. Drugs called beta blockers may give some relief. Drugs that decrease hormone production will provide temporary relief in many people. These measures will usually not give permanent relief. Definitive therapy There are treatments available which can be discussed between you and your caregiver which will permanently treat the problem. These treatments range from surgery (removal of the thyroid), to the use of radioactive iodine (destroys the thyroid by radiation), to the use of  antithyroid drugs (interfere with hormone synthesis). The first two treatments are permanent and usually successful. They most often require hormone replacement therapy for life. This is because it is impossible to remove or destroy the exact amount of thyroid required to make a person euthyroid (normal). HOME CARE INSTRUCTIONS  See your caregiver if the problems you are being treated for get worse. Examples of this would be the problems listed above. SEEK MEDICAL CARE IF: Your general condition worsens. MAKE SURE YOU:   Understand these instructions.  Will watch your condition.  Will get help right away if you are not doing well or get worse. Document Released: 02/14/2005 Document Revised: 05/09/2011 Document Reviewed: 06/28/2006 [facility] Patient Information 2015 [place], [place]. This information is not intended to replace advice given to you by your health care provider. Make sure you discuss any questions you have with your health care provider.

## 2013-12-19 ENCOUNTER — Ambulatory Visit (INDEPENDENT_AMBULATORY_CARE_PROVIDER_SITE_OTHER): Payer: Medicare Other | Admitting: Family Medicine

## 2013-12-19 ENCOUNTER — Encounter: Payer: Self-pay | Admitting: Family Medicine

## 2013-12-19 VITALS — BP 130/84 | HR 80 | Temp 98.5°F | Resp 16 | Wt 170.4 lb

## 2013-12-19 DIAGNOSIS — M25552 Pain in left hip: Secondary | ICD-10-CM

## 2013-12-19 MED ORDER — PREDNISONE 10 MG PO TABS: ORAL_TABLET | ORAL | Status: DC

## 2013-12-19 NOTE — Patient Instructions (Signed)
Follow up as needed Start the Prednisone as directed- take w/ food Add Tylenol as needed for breakthrough pain Alternate ice and heat We'll call you with your ortho appt Call with any questions or concerns Hang in there!!!

## 2013-12-19 NOTE — Assessment & Plan Note (Signed)
New.  Pt w/ apparent combination of bursitis and true hip pain.  Start prednisone taper for immediate symptom relief.  Refer to ortho.  Reviewed supportive care and red flags that should prompt return.  Pt expressed understanding and is in agreement w/ plan.

## 2013-12-19 NOTE — Progress Notes (Signed)
Pre visit review using our clinic review tool, if applicable. No additional management support is needed unless otherwise documented below in the visit note. 

## 2013-12-19 NOTE — Progress Notes (Signed)
   Subjective:    Patient ID: Tracy [name], female    DOB: [birth year], 63 y.o.   MRN: [id number]  HPI L Hip Pain- 'i can't sit, i can't stand.  i walk everyday faithfully but i can barely stand it'.  No radiation of pain down thigh.  Pain is localized to 'under this fatty lump that i've had forever'.  Pain will radiate into butt and groin when walking.  Pt prefers Dr Maureen Ralphs.   Review of Systems For ROS see HPI     Objective:   Physical Exam  Vitals reviewed. Constitutional: She is oriented to person, place, and time. She appears well-developed and well-nourished. No distress.  Cardiovascular: Intact distal pulses.   Musculoskeletal: She exhibits no edema.  No pain w/ hip flexion/extension Pain w/ both internal and external rotation of L hip + TTP over L greater trochanteric bursa  Neurological: She is alert and oriented to person, place, and time. She has normal reflexes. Coordination (antalgic gait) normal.  Skin: Skin is warm and dry.  Psychiatric: She has a normal mood and affect. Her behavior is normal. Thought content normal.          Assessment & Plan:

## 2013-12-20 ENCOUNTER — Telehealth: Payer: Self-pay | Admitting: Family Medicine

## 2013-12-20 NOTE — Telephone Encounter (Signed)
Pt.notified

## 2013-12-20 NOTE — Telephone Encounter (Signed)
Do you want her to space these out or just take all at once? I had told her in office to take all at once.

## 2013-12-20 NOTE — Telephone Encounter (Signed)
Caller name: Syesha  Call back number: [phone]   Reason for call:  Pt started taking her prednisone last night, and the instructions said take 3 for 3 days and so forth.  Pt went home last night and took the 3 at one time.  She wants to know if this is how she is supposed to take her space them out.  Please call to advise.

## 2013-12-20 NOTE — Telephone Encounter (Signed)
All at once- w/ food

## 2014-01-10 ENCOUNTER — Encounter: Payer: Self-pay | Admitting: Family Medicine

## 2014-01-10 MED ORDER — OMEPRAZOLE 40 MG PO CPDR: 40.0000 mg | DELAYED_RELEASE_CAPSULE | Freq: Every day | ORAL | Status: DC

## 2014-01-10 NOTE — Telephone Encounter (Signed)
Med filled.  

## 2014-02-11 ENCOUNTER — Encounter: Payer: Self-pay | Admitting: Family Medicine

## 2014-02-11 ENCOUNTER — Encounter: Payer: Self-pay | Admitting: Internal Medicine

## 2014-02-11 MED ORDER — OMEPRAZOLE 40 MG PO CPDR: 40.0000 mg | DELAYED_RELEASE_CAPSULE | Freq: Every day | ORAL | Status: DC

## 2014-02-11 NOTE — Telephone Encounter (Signed)
Med filled.  

## 2014-02-12 ENCOUNTER — Other Ambulatory Visit (INDEPENDENT_AMBULATORY_CARE_PROVIDER_SITE_OTHER): Payer: Medicare Other

## 2014-02-12 DIAGNOSIS — E0590 Thyrotoxicosis, unspecified without thyrotoxic crisis or storm: Secondary | ICD-10-CM

## 2014-02-13 LAB — T3, FREE: T3, Free: 3 pg/mL (ref 2.3–4.2)

## 2014-02-13 LAB — T4, FREE: Free T4: 1.36 ng/dL (ref 0.60–1.60)

## 2014-02-13 LAB — TSH: TSH: 0.19 u[IU]/mL — AB (ref 0.35–4.50)

## 2014-02-18 ENCOUNTER — Encounter: Payer: Self-pay | Admitting: Family Medicine

## 2014-02-18 ENCOUNTER — Other Ambulatory Visit: Payer: Self-pay | Admitting: General Practice

## 2014-02-18 MED ORDER — OMEPRAZOLE 40 MG PO CPDR: 40.0000 mg | DELAYED_RELEASE_CAPSULE | Freq: Every day | ORAL | Status: DC

## 2014-02-19 ENCOUNTER — Ambulatory Visit (INDEPENDENT_AMBULATORY_CARE_PROVIDER_SITE_OTHER): Payer: Medicare Other | Admitting: Medical

## 2014-02-19 VITALS — BP 124/85 | HR 95 | Temp 98.3°F | Ht 64.25 in | Wt 169.2 lb

## 2014-02-19 DIAGNOSIS — L739 Follicular disorder, unspecified: Secondary | ICD-10-CM | POA: Insufficient documentation

## 2014-02-19 DIAGNOSIS — J3089 Other allergic rhinitis: Secondary | ICD-10-CM

## 2014-02-19 DIAGNOSIS — J309 Allergic rhinitis, unspecified: Secondary | ICD-10-CM | POA: Insufficient documentation

## 2014-02-19 LAB — THYROID STIMULATING IMMUNOGLOBULIN: TSI: 22 % baseline (ref <140)

## 2014-02-19 MED ORDER — LORATADINE 10 MG PO TABS
10.0000 mg | ORAL_TABLET | Freq: Every day | ORAL | Status: DC
Start: 2014-02-19 — End: 2014-03-10

## 2014-02-19 MED ORDER — DOXYCYCLINE HYCLATE 100 MG PO TABS: 100.0000 mg | ORAL_TABLET | Freq: Two times a day (BID) | ORAL | Status: DC

## 2014-02-19 MED ORDER — FLUTICASONE PROPIONATE 50 MCG/ACT NA SUSP: 2.0000 | Freq: Every day | NASAL | Status: DC

## 2014-02-19 NOTE — Assessment & Plan Note (Signed)
You appear to have some baseline allergies by your description and hx. I will rx flonase nasal spray and claritin.

## 2014-02-19 NOTE — Progress Notes (Signed)
Subjective:    Patient ID: Tracy [name], female    DOB: [birth year], 63 y.o.   MRN: [id number]  HPI   Pt in with rash on her face. Most on her nose and some on her cheeks. This is going on for 2 weeks. Pt quit make up x 2 days and stopped moisturizer for 2 days. So know face itching little. Slight burning sensation to her skin. Also mild pimple out break per pt report. No vesicles described and she definitely reports that these small bumps have been for 2 weeks and have been on both sides.  Pt has mild runny nose. Some sneezing and some eye itching. But not reporting other upper respiratory type symptoms.  Past Medical History  Diagnosis Date  . Anxiety   . Thyroid disease     hypothyroidism  . Osteopenia   . Diverticulitis   . GERD (gastroesophageal reflux disease)   . Hyperlipidemia     History   Social History  . Marital Status: Married    Spouse Name: N/A    Number of Children: N/A  . Years of Education: N/A   Occupational History  . Not on file.   Social History Main Topics  . Smoking status: Never Smoker   . Smokeless tobacco: Not on file  . Alcohol Use: Yes  . Drug Use: No  . Sexual Activity: Not on file   Other Topics Concern  . Not on file   Social History Narrative  . No narrative on file    Past Surgical History  Procedure Laterality Date  . Knee surgery    . Back surgery    . Laparoscopic liver cyst fenestration    . Foot surgery    . Cholecystectomy    . Tonsillectomy      Family History  Problem Relation Age of Onset  . Hypertension Mother   . Stroke Mother   . Cancer Neg Hx   . Diabetes Neg Hx     Allergies  Allergen Reactions  . Demerol [Meperidine] Nausea And Vomiting    Current Outpatient Prescriptions on File Prior to Visit  Medication Sig Dispense Refill  . Calcium Carbonate (CALCIUM 600 PO) Take by mouth. Take 2 daily      . Cholecalciferol (VITAMIN D3) 2000 UNITS TABS Take by mouth.    . conjugated estrogens  (PREMARIN) vaginal cream Apply vaginally 2-3 times weekly as needed for vaginal irritation. 42.5 g 3  . Cranberry (SM CRANBERRY) 300 MG tablet Take 300 mg by mouth 3 (three) times daily.    . fenofibrate 160 MG tablet TAKE 1 TABLET (160 MG TOTAL) BY MOUTH DAILY. 90 tablet 3  . Multiple Vitamin (MULTIVITAMIN) capsule Take 1 capsule by mouth daily.      Marland Kitchen omeprazole (PRILOSEC) 40 MG capsule Take 1 capsule (40 mg total) by mouth daily. 90 capsule 1   No current facility-administered medications on file prior to visit.    BP 124/85 mmHg  Pulse 95  Temp(Src) 98.3 F (36.8 C) (Oral)  Ht 5' 4.25" (1.632 m)  Wt 169 lb 3.2 oz (76.749 kg)  BMI 28.82 kg/m2  SpO2 95%      Review of Systems  Constitutional: Negative for fever, chills and fatigue.  HENT: Positive for congestion, rhinorrhea and sneezing.   Eyes: Positive for itching.  Respiratory: Negative for cough, chest tightness, shortness of breath and wheezing.   Cardiovascular: Negative for chest pain and palpitations.  Musculoskeletal: Negative for back pain.  Skin: Positive  for rash.  Hematological: Negative for adenopathy.       Objective:   Physical Exam   General- No acute distress. Pleasant patient. Neck- Full range of motion, no jvd Lungs- Clear, even and unlabored. Heart- regular rate and rhythm. Neurologic- CNII- XII grossly intact. Skin- 5-6 small follicles inflamed on her nose. Recent healing scars lt cheek numbering two. No vesciles seen. No redness or warmth on palpation. Heent- no sinus pressure, canals and tm both clear/nml. Boggy turbinates. Posterior pharynx normal but pnd.         Assessment & Plan:

## 2014-02-19 NOTE — Assessment & Plan Note (Signed)
You appear to have folliculitis of your nose. I will rx doxycycline antibiotic. I do want you to notify us if you have any worsening or spreading rash. You reported rash on both sides of your face and therefore shingles is unlikley. But if rash worsens / or vesicles were to appear on one side let us know or be seen by [facility] over holidays

## 2014-02-19 NOTE — Progress Notes (Signed)
Pre visit review using our clinic review tool, if applicable. No additional management support is needed unless otherwise documented below in the visit note. 

## 2014-02-19 NOTE — Patient Instructions (Addendum)
You appear to have folliculitis of your nose. I will rx doxycycline antibiotic. I do want you to notify us if you have any worsening or spreading rash. You reported rash on both sides of your face and therefore shingles is unlikley. But if rash worsens or vesicles were to appear  on one side let us know or be seen by [facility] over holidays.  You appear to have some baseline allergies by your description and hx. I will rx flonase nasal spray and claritin.  Follow up in 1 wk or as needed.

## 2014-03-03 ENCOUNTER — Encounter: Payer: Self-pay | Admitting: Family Medicine

## 2014-03-03 MED ORDER — FENOFIBRATE 160 MG PO TABS: ORAL_TABLET | ORAL | Status: DC

## 2014-03-03 NOTE — Telephone Encounter (Signed)
Med filled.  

## 2014-03-10 ENCOUNTER — Encounter: Payer: Self-pay | Admitting: Family Medicine

## 2014-03-10 ENCOUNTER — Ambulatory Visit (INDEPENDENT_AMBULATORY_CARE_PROVIDER_SITE_OTHER): Payer: Medicare Other | Admitting: Family Medicine

## 2014-03-10 VITALS — BP 120/82 | HR 72 | Temp 98.1°F | Resp 16 | Ht 64.25 in | Wt 171.2 lb

## 2014-03-10 DIAGNOSIS — E781 Pure hyperglyceridemia: Secondary | ICD-10-CM

## 2014-03-10 DIAGNOSIS — M8580 Other specified disorders of bone density and structure, unspecified site: Secondary | ICD-10-CM

## 2014-03-10 DIAGNOSIS — Z1231 Encounter for screening mammogram for malignant neoplasm of breast: Secondary | ICD-10-CM | POA: Diagnosis not present

## 2014-03-10 DIAGNOSIS — Z0000 Encounter for general adult medical examination without abnormal findings: Secondary | ICD-10-CM

## 2014-03-10 DIAGNOSIS — M859 Disorder of bone density and structure, unspecified: Secondary | ICD-10-CM

## 2014-03-10 NOTE — Assessment & Plan Note (Signed)
Chronic problem.  Tolerating fenofibrate w/o difficulty.  Check labs.  Adjust meds prn  

## 2014-03-10 NOTE — Assessment & Plan Note (Signed)
Pt's PE WNL.  UTD on colonoscopy.  No need for pap.  Due for DEXA and mammo- ordered today.  Written screening schedule updated and given to pt. EKG done- see document for interpretation.  Check labs.  Anticipatory guidance provided.

## 2014-03-10 NOTE — Assessment & Plan Note (Signed)
Pt w/ hx of this.  Due for DEXA- ordered today.  Check Vit D.  Replete prn.

## 2014-03-10 NOTE — Patient Instructions (Signed)
Follow up in 6 months to recheck lipids Keep up the good work!  You look great! We'll notify you of your lab results and make any changes if needed Call with any questions or concerns Happy Early Birthday and Tracy [name] with Surgery!!!

## 2014-03-10 NOTE — Progress Notes (Signed)
Pre visit review using our clinic review tool, if applicable. No additional management support is needed unless otherwise documented below in the visit note. 

## 2014-03-10 NOTE — Progress Notes (Signed)
   Subjective:    Patient ID: Tracy [name], female    DOB: October 27, 1950, 64 y.o.   MRN: [id number]  HPI Here today for CPE.  Risk Factors: Hypertriglyceridemia- chronic problem, on Fenofibrate Hypothyroid- pt has hx of abnormal TSH, not currently on meds.  Following w/ Dr Cruzita Lederer Osteopenia- due for DEXA, on Ca and Vit D Physical Activity: exercising regularly, pt has L hip surgery pending. Fall Risk: low Depression: denies current sxs Hearing: normal to conversational tones and whispered voice at 6 ft ADL's: independent Cognitive: normal linear thought process, memory and attention Home Safety: safe at home Height, Weight, BMI, Visual Acuity: see vitals, vision corrected to 20/20 w/ glasses Counseling: UTD on colonoscopy, due for mammo and DEXA.  No need for paps. Healthcare POA/Living Will: has both in place Labs Ordered: See A&P Care Plan: See A&P    Review of Systems Patient reports no vision/ hearing changes, adenopathy,fever, weight change,  persistant/recurrent hoarseness , swallowing issues, chest pain, edema, persistant/recurrent cough, hemoptysis, dyspnea (rest/exertional/paroxysmal nocturnal), gastrointestinal bleeding (melena, rectal bleeding), abdominal pain, significant heartburn, bowel changes, GU symptoms (dysuria, hematuria, incontinence), Gyn symptoms (abnormal  bleeding, pain),  syncope, focal weakness, memory loss, numbness & tingling, skin/hair/nail changes, abnormal bruising or bleeding, anxiety, or depression.   Intermittent palpitations    Objective:   Physical Exam General Appearance:    Alert, cooperative, no distress, appears stated age  Head:    Normocephalic, without obvious abnormality, atraumatic  Eyes:    PERRL, conjunctiva/corneas clear, EOM's intact, fundi    benign, both eyes  Ears:    Normal TM's and external ear canals, both ears  Nose:   Nares normal, septum midline, mucosa normal, no drainage    or sinus tenderness  Throat:   Lips, mucosa,  and tongue normal; teeth and gums normal  Neck:   Supple, symmetrical, trachea midline, no adenopathy;    Thyroid: no enlargement/tenderness/nodules  Back:     Symmetric, no curvature, ROM normal, no CVA tenderness  Lungs:     Clear to auscultation bilaterally, respirations unlabored  Chest Wall:    No tenderness or deformity   Heart:    Regular rate and rhythm, S1 and S2 normal, no murmur, rub   or gallop  Breast Exam:    Deferred to mammo  Abdomen:     Soft, non-tender, bowel sounds active all four quadrants,    no masses, no organomegaly  Genitalia:    Deferred  Rectal:    Extremities:   Extremities normal, atraumatic, no cyanosis or edema  Pulses:   2+ and symmetric all extremities  Skin:   Skin color, texture, turgor normal, no rashes or lesions  Lymph nodes:   Cervical, supraclavicular, and axillary nodes normal  Neurologic:   CNII-XII intact, normal strength, sensation and reflexes    throughout          Assessment & Plan:

## 2014-03-11 LAB — CBC WITH DIFFERENTIAL/PLATELET
Basophils Absolute: 0 10*3/uL (ref 0.0–0.1)
Basophils Relative: 0.7 % (ref 0.0–3.0)
EOS PCT: 1 % (ref 0.0–5.0)
Eosinophils Absolute: 0.1 10*3/uL (ref 0.0–0.7)
HEMATOCRIT: 42.2 % (ref 36.0–46.0)
HEMOGLOBIN: 13.8 g/dL (ref 12.0–15.0)
LYMPHS ABS: 2.1 10*3/uL (ref 0.7–4.0)
Lymphocytes Relative: 32.2 % (ref 12.0–46.0)
MCHC: 32.7 g/dL (ref 30.0–36.0)
MCV: 91.8 fl (ref 78.0–100.0)
MONO ABS: 0.5 10*3/uL (ref 0.1–1.0)
Monocytes Relative: 7.5 % (ref 3.0–12.0)
NEUTROS ABS: 3.8 10*3/uL (ref 1.4–7.7)
Neutrophils Relative %: 58.6 % (ref 43.0–77.0)
PLATELETS: 226 10*3/uL (ref 150.0–400.0)
RBC: 4.6 Mil/uL (ref 3.87–5.11)
RDW: 13.4 % (ref 11.5–15.5)
WBC: 6.5 10*3/uL (ref 4.0–10.5)

## 2014-03-11 LAB — BASIC METABOLIC PANEL
BUN: 25 mg/dL — AB (ref 6–23)
CO2: 27 mEq/L (ref 19–32)
Calcium: 10.1 mg/dL (ref 8.4–10.5)
Chloride: 109 mEq/L (ref 96–112)
Creatinine, Ser: 1 mg/dL (ref 0.4–1.2)
GFR: 59.66 mL/min — ABNORMAL LOW (ref >60.00)
GLUCOSE: 95 mg/dL (ref 70–99)
POTASSIUM: 3.9 meq/L (ref 3.5–5.1)
Sodium: 142 mEq/L (ref 135–145)

## 2014-03-11 LAB — HEPATIC FUNCTION PANEL
ALBUMIN: 4.4 g/dL (ref 3.5–5.2)
ALK PHOS: 75 U/L (ref 39–117)
ALT: 25 U/L (ref 0–35)
AST: 31 U/L (ref 0–37)
Bilirubin, Direct: 0.1 mg/dL (ref 0.0–0.3)
TOTAL PROTEIN: 7.2 g/dL (ref 6.0–8.3)
Total Bilirubin: 0.7 mg/dL (ref 0.2–1.2)

## 2014-03-11 LAB — LIPID PANEL
Cholesterol: 198 mg/dL (ref 0–200)
HDL: 63.9 mg/dL (ref >39.00)
LDL Cholesterol: 110 mg/dL — ABNORMAL HIGH (ref 0–99)
NonHDL: 134.1
Total CHOL/HDL Ratio: 3
Triglycerides: 122 mg/dL (ref 0.0–149.0)
VLDL: 24.4 mg/dL (ref 0.0–40.0)

## 2014-03-11 LAB — VITAMIN D 25 HYDROXY (VIT D DEFICIENCY, FRACTURES): VITD: 43.9 ng/mL (ref 30.00–100.00)

## 2014-03-17 ENCOUNTER — Encounter: Payer: Self-pay | Admitting: Family Medicine

## 2014-03-18 ENCOUNTER — Ambulatory Visit: Payer: Self-pay | Admitting: Orthopedic Surgery

## 2014-03-18 NOTE — Progress Notes (Signed)
Preoperative surgical orders have been place into the Epic hospital system for Tracy [name] on 03/18/2014, 12:14 PM  by Mickel Crow for surgery on 04-01-2014.  Preop Hip orders including Experel Injecion, IV Tylenol, and IV Decadron as long as there are no contraindications to the above medications. Arlee Muslim, PA-C

## 2014-03-20 ENCOUNTER — Encounter: Payer: Self-pay | Admitting: Family Medicine

## 2014-03-20 DIAGNOSIS — Z1231 Encounter for screening mammogram for malignant neoplasm of breast: Secondary | ICD-10-CM | POA: Diagnosis not present

## 2014-03-20 DIAGNOSIS — M810 Age-related osteoporosis without current pathological fracture: Secondary | ICD-10-CM | POA: Diagnosis not present

## 2014-03-20 LAB — HM MAMMOGRAPHY

## 2014-03-24 ENCOUNTER — Encounter: Payer: Self-pay | Admitting: Family Medicine

## 2014-03-24 ENCOUNTER — Telehealth: Payer: Self-pay | Admitting: Family Medicine

## 2014-03-24 NOTE — Telephone Encounter (Signed)
Caller name: Treanna, [place] Relation to pt: self  Call back number: [phone] Pharmacy: [id number] - [place], [address] AT [address] [phone] (Phone)     Reason for call:  Pt has reoccuring rash and requesting you prescribed doxycycline (VIBRA-TABS) 100 MG tablet

## 2014-03-26 ENCOUNTER — Encounter: Payer: Self-pay | Admitting: Family Medicine

## 2014-03-26 ENCOUNTER — Ambulatory Visit (INDEPENDENT_AMBULATORY_CARE_PROVIDER_SITE_OTHER): Payer: Medicare Other | Admitting: Medical

## 2014-03-26 VITALS — BP 124/79 | HR 73 | Temp 98.0°F | Ht 64.25 in | Wt 172.6 lb

## 2014-03-26 DIAGNOSIS — L719 Rosacea, unspecified: Secondary | ICD-10-CM | POA: Diagnosis not present

## 2014-03-26 MED ORDER — DOXYCYCLINE HYCLATE 100 MG PO TABS: 100.0000 mg | ORAL_TABLET | Freq: Two times a day (BID) | ORAL | Status: DC

## 2014-03-26 NOTE — Assessment & Plan Note (Signed)
Likey rosacea, I will rx another round of doxycycline but also go ahead and refer to dermatologist for confirmation and for possible preventative type treatment.

## 2014-03-26 NOTE — Progress Notes (Signed)
   Subjective:    Patient ID: Tracy [name], female    DOB: 05/02/1950, 64 y.o.   MRN: [id number]  HPI  Patient here for couple of spots on her face. Pt states that her face is healing again after she took husbands doxycycline. She already took 5 days. I treated her with Doxycycline in the past and she improved significantly.   So she got out break of potential rosacea when I first saw her and then she had reoccurence.   Past Medical History  Diagnosis Date  . Anxiety   . Thyroid disease     hypothyroidism  . Osteopenia   . Diverticulitis   . GERD (gastroesophageal reflux disease)   . Hyperlipidemia     Review of Systems  Constitutional: Negative for fever, chills and fatigue.  Respiratory: Negative for cough, choking, chest tightness and wheezing.   Cardiovascular: Negative for chest pain and palpitations.  Musculoskeletal: Negative for back pain.  Skin: Positive for rash.       Scattered mild rash on her face.  Neurological: Negative for dizziness, seizures, speech difficulty, weakness, light-headedness, numbness and headaches.  Hematological: Negative for adenopathy. Does not bruise/bleed easily.       Objective:    Physical Exam   General- No acute distress. Pleasant patient. Neck- Full range of motion, no jvd Lungs- Clear, even and unlabored. Heart- regular rate and rhythm. Neurologic- CNII- XII grossly intact. Dermatolgic- mild rash to face both cheeks. Looks like 2 follicles on each side mild inflamed. No vesicles.     BP 124/79 mmHg  Pulse 73  Temp(Src) 98 F (36.7 C) (Oral)  Ht 5' 4.25" (1.632 m)  Wt 172 lb 9.6 oz (78.291 kg)  BMI 29.39 kg/m2  SpO2 95% Wt Readings from Last 3 Encounters:  03/26/14 172 lb 9.6 oz (78.291 kg)  03/10/14 171 lb 4 oz (77.678 kg)  02/19/14 169 lb 3.2 oz (76.749 kg)             Assessment & Plan:

## 2014-03-26 NOTE — Patient Instructions (Addendum)
Tracy [name]  03/26/2014   Your procedure is scheduled on: 04/01/14   Report to [facility] Main  Entrance and follow signs to               [place] at 11:30  AM.   Call this number if you have problems the morning of surgery [phone]   Remember:  Do not eat food  :After Midnight.             MAY HAVE CLEAR LIQUIDS UNTIL 7:30 AM    CLEAR LIQUID DIET   Foods Allowed                                                                     Foods Excluded  Coffee and tea, regular and decaf                             liquids that you cannot  Plain Jell-O in any flavor                                             see through such as: Fruit ices (not with fruit pulp)                                     milk, soups, orange juice  Iced Popsicles                                    All solid food Carbonated beverages, regular and diet                                    Cranberry, grape and apple juices Sports drinks like Gatorade Lightly seasoned clear broth or consume(fat free) Sugar, honey syrup   _____________________________________________________________________     Take these medicines the morning of surgery with A SIP OF WATER:  OMEPRAZOLE / FENOFIBRATE / DOXYCYCLINE                               You may not have any metal on your body including hair pins and              piercings  Do not wear jewelry, make-up, lotions, powders or perfumes.             Do not wear nail polish.  Do not shave  48 hours prior to surgery.              Men may shave face and neck.   Do not bring valuables to the hospital. [place].  Contacts, dentures or bridgework may not be worn into  surgery.  Leave suitcase in the car. After surgery it may be brought to your room.     Patients discharged the day of surgery will not be allowed to drive home.  Name and phone number of your driver:  Special Instructions:  N/A              Please read over the following fact sheets you were given: _____________________________________________________________________                                                     [place]  Before surgery, you can play an important role.  Because skin is not sterile, your skin needs to be as free of germs as possible.  You can reduce the number of germs on your skin by washing with CHG (chlorahexidine gluconate) soap before surgery.  CHG is an antiseptic cleaner which kills germs and bonds with the skin to continue killing germs even after washing. Please DO NOT use if you have an allergy to CHG or antibacterial soaps.  If your skin becomes reddened/irritated stop using the CHG and inform your nurse when you arrive at Short Stay. Do not shave (including legs and underarms) for at least 48 hours prior to the first CHG shower.  You may shave your face. Please follow these instructions carefully:   1.  Shower with CHG Soap the night before surgery and the  morning of Surgery.   2.  If you choose to wash your hair, wash your hair first as usual with your  normal  Shampoo.   3.  After you shampoo, rinse your hair and body thoroughly to remove the  shampoo.                                         4.  Use CHG as you would any other liquid soap.  You can apply chg directly  to the skin and wash . Gently wash with scrungie or clean wascloth    5.  Apply the CHG Soap to your body ONLY FROM THE NECK DOWN.   Do not use on open                           Wound or open sores. Avoid contact with eyes, ears mouth and genitals (private parts).                        Genitals (private parts) with your normal soap.              6.  Wash thoroughly, paying special attention to the area where your surgery  will be performed.   7.  Thoroughly rinse your body with warm water from the neck down.   8.  DO NOT shower/wash with your normal soap after using and rinsing  off  the CHG Soap .                9.  Pat yourself dry with a clean towel.             10.  Wear clean pajamas.  11.  Place clean sheets on your bed the night of your first shower and do not  sleep with pets.  Day of Surgery : Do not apply any lotions/deodorants the morning of surgery.  Please wear clean clothes to the hospital/surgery center.  FAILURE TO FOLLOW THESE INSTRUCTIONS MAY RESULT IN THE CANCELLATION OF YOUR SURGERY    PATIENT SIGNATURE_________________________________  ______________________________________________________________________     Tracy [name]  An incentive spirometer is a tool that can help keep your lungs clear and active. This tool measures how well you are filling your lungs with each breath. Taking long deep breaths may help reverse or decrease the chance of developing breathing (pulmonary) problems (especially infection) following:  A long period of time when you are unable to move or be active. BEFORE THE PROCEDURE   If the spirometer includes an indicator to show your best effort, your nurse or respiratory therapist will set it to a desired goal.  If possible, sit up straight or lean slightly forward. Try not to slouch.  Hold the incentive spirometer in an upright position. INSTRUCTIONS FOR USE  1. Sit on the edge of your bed if possible, or sit up as far as you can in bed or on a chair. 2. Hold the incentive spirometer in an upright position. 3. Breathe out normally. 4. Place the mouthpiece in your mouth and seal your lips tightly around it. 5. Breathe in slowly and as deeply as possible, raising the piston or the ball toward the top of the column. 6. Hold your breath for 3-5 seconds or for as long as possible. Allow the piston or ball to fall to the bottom of the column. 7. Remove the mouthpiece from your mouth and breathe out normally. 8. Rest for a few seconds and repeat Steps 1 through 7 at least 10 times every 1-2  hours when you are awake. Take your time and take a few normal breaths between deep breaths. 9. The spirometer may include an indicator to show your best effort. Use the indicator as a goal to work toward during each repetition. 10. After each set of 10 deep breaths, practice coughing to be sure your lungs are clear. If you have an incision (the cut made at the time of surgery), support your incision when coughing by placing a pillow or rolled up towels firmly against it. Once you are able to get out of bed, walk around indoors and cough well. You may stop using the incentive spirometer when instructed by your caregiver.  RISKS AND COMPLICATIONS  Take your time so you do not get dizzy or light-headed.  If you are in pain, you may need to take or ask for pain medication before doing incentive spirometry. It is harder to take a deep breath if you are having pain. AFTER USE  Rest and breathe slowly and easily.  It can be helpful to keep track of a log of your progress. Your caregiver can provide you with a simple table to help with this. If you are using the spirometer at home, follow these instructions: [place] IF:   You are having difficultly using the spirometer.  You have trouble using the spirometer as often as instructed.  Your pain medication is not giving enough relief while using the spirometer.  You develop fever of 100.5 F (38.1 C) or higher. SEEK IMMEDIATE MEDICAL CARE IF:   You cough up bloody sputum that had not been present before.  You develop fever of 102 F (  38.9 C) or greater.  You develop worsening pain at or near the incision site. MAKE SURE YOU:   Understand these instructions.  Will watch your condition.  Will get help right away if you are not doing well or get worse. Document Released: 06/27/2006 Document Revised: 05/09/2011 Document Reviewed: 08/28/2006 [facility] Patient Information 2014 [place],  [place].   ________________________________________________________________________

## 2014-03-26 NOTE — Patient Instructions (Signed)
Likey rosacea, I will rx another round of doxycycline but also go ahead and refer to dermatologist for confirmation and for possible preventative type treatment.  Rosacea Rosacea is a long-term (chronic) condition that affects the skin of the face (cheeks, nose, brow, and chin) and sometimes the eyes. Rosacea causes the blood vessels near the surface of the skin to enlarge, resulting in redness. This condition usually begins after age 66. It occurs most often in light-skinned women. Without treatment, rosacea tends to get worse over time. There is no cure for rosacea, but treatment can help control your symptoms. CAUSES  The cause is unknown. It is thought that some people may inherit a tendency to develop rosacea. Certain triggers can make your rosacea worse, including:  Hot baths.  Exercise.  Sunlight.  Very hot or cold temperatures.  Hot or spicy foods and drinks.  Drinking alcohol.  Stress.  Taking blood pressure medicine.  Long-term use of topical steroids on the face. SYMPTOMS   Redness of the face.  Red bumps or pimples on the face.  Red, enlarged nose (rhinophyma).  Blushing easily.  Red lines on the skin.  Irritated or burning feeling in the eyes.  Swollen eyelids. DIAGNOSIS  Your caregiver can usually tell what is wrong by asking about your symptoms and performing a physical exam. TREATMENT  Avoiding triggers is an important part of treatment. You will also need to see a skin specialist (dermatologist) who can develop a treatment plan for you. The goals of treatment are to control your condition and to improve the appearance of your skin. It may take several weeks or months of treatment before you notice an improvement in your skin. Even after your skin improves, you will likely need to continue treatment to prevent your rosacea from coming back. Treatment methods may include:  Using sunscreen or sunblock daily to protect the skin.  Antibiotic medicine, such as  metronidazole, applied directly to the skin.  Antibiotics taken by mouth. This is usually prescribed if you have eye problems from your rosacea.  Laser surgery to improve the appearance of the skin. This surgery can reduce the appearance of red lines on the skin and can remove excess tissue from the nose to reduce its size. HOME CARE INSTRUCTIONS  Avoid things that seem to trigger your flare-ups.  If you are given antibiotics, take them as directed. Finish them even if you start to feel better.  Use a gentle facial cleanser that does not contain alcohol.  You may use a mild facial moisturizer.  Use a sunscreen or sunblock with SPF 30 or greater.  Wear a green-tinted foundation powder to conceal redness, if needed. Choose cosmetics that are noncomedogenic. This means they do not block your pores.  If your eyelids are affected, apply warm compresses to the eyelids. Do this up to 4 times a day or as directed by your caregiver. SEEK MEDICAL CARE IF:  Your skin problems get worse.  You feel depressed.  You lose your appetite.  You have trouble concentrating.  You have problems with your eyes, such as redness or itching. MAKE SURE YOU:  Understand these instructions.  Will watch your condition.  Will get help right away if you are not doing well or get worse. Document Released: 03/24/2004 Document Revised: 08/16/2011 Document Reviewed: 01/25/2011 [facility] Patient Information 2015 [place], [place]. This information is not intended to replace advice given to you by your health care provider. Make sure you discuss any questions you have with your  health care provider.  

## 2014-03-26 NOTE — Progress Notes (Signed)
Pre visit review using our clinic review tool, if applicable. No additional management support is needed unless otherwise documented below in the visit note. 

## 2014-03-27 ENCOUNTER — Encounter (HOSPITAL_COMMUNITY): Payer: Self-pay

## 2014-03-27 ENCOUNTER — Encounter: Payer: Self-pay | Admitting: Family Medicine

## 2014-03-27 ENCOUNTER — Encounter (HOSPITAL_COMMUNITY)
Admission: RE | Admit: 2014-03-27 | Discharge: 2014-03-27 | Disposition: A | Discharge status: Home / Self Care | Payer: Medicare Other | Source: Ambulatory Visit | Attending: Orthopedic Surgery | Admitting: Orthopedic Surgery

## 2014-03-27 DIAGNOSIS — M7062 Trochanteric bursitis, left hip: Secondary | ICD-10-CM | POA: Insufficient documentation

## 2014-03-27 DIAGNOSIS — Z01818 Encounter for other preprocedural examination: Secondary | ICD-10-CM | POA: Diagnosis not present

## 2014-03-27 HISTORY — DX: Unspecified osteoarthritis, unspecified site: M19.90

## 2014-03-27 HISTORY — DX: Personal history of urinary (tract) infections: Z87.440

## 2014-03-27 HISTORY — DX: Nausea with vomiting, unspecified: Z98.890

## 2014-03-27 HISTORY — DX: Other bursitis of hip, left hip: M70.72

## 2014-03-27 HISTORY — DX: Other complications of anesthesia, initial encounter: T88.59XA

## 2014-03-27 HISTORY — DX: Rosacea, unspecified: L71.9

## 2014-03-27 HISTORY — DX: Thyrotoxicosis, unspecified without thyrotoxic crisis or storm: E05.90

## 2014-03-27 HISTORY — DX: Adverse effect of unspecified anesthetic, initial encounter: T41.45XA

## 2014-03-27 HISTORY — DX: Nausea with vomiting, unspecified: R11.2

## 2014-03-27 LAB — CBC
HCT: 41.5 % (ref 36.0–46.0)
Hemoglobin: 13.6 g/dL (ref 12.0–15.0)
MCH: 30.3 pg (ref 26.0–34.0)
MCHC: 32.8 g/dL (ref 30.0–36.0)
MCV: 92.4 fL (ref 78.0–100.0)
Platelets: 242 10*3/uL (ref 150–400)
RBC: 4.49 MIL/uL (ref 3.87–5.11)
RDW: 12.8 % (ref 11.5–15.5)
WBC: 5.5 10*3/uL (ref 4.0–10.5)

## 2014-03-31 ENCOUNTER — Encounter: Payer: Self-pay | Admitting: General Practice

## 2014-04-01 ENCOUNTER — Ambulatory Visit (HOSPITAL_COMMUNITY): Payer: Medicare Other | Admitting: Anesthesiology

## 2014-04-01 ENCOUNTER — Encounter (HOSPITAL_COMMUNITY): Payer: Self-pay | Admitting: Certified Registered"

## 2014-04-01 ENCOUNTER — Encounter (HOSPITAL_COMMUNITY)
Admission: RE | Disposition: A | Discharge status: Home / Self Care | Payer: Self-pay | Source: Ambulatory Visit | Attending: Orthopedic Surgery

## 2014-04-01 ENCOUNTER — Observation Stay (HOSPITAL_COMMUNITY)
Admission: RE | Admit: 2014-04-01 | Discharge: 2014-04-02 | Disposition: A | Discharge status: Home / Self Care | Payer: Medicare Other | Source: Ambulatory Visit | Attending: Orthopedic Surgery | Admitting: Orthopedic Surgery

## 2014-04-01 DIAGNOSIS — M71552 Other bursitis, not elsewhere classified, left hip: Secondary | ICD-10-CM | POA: Diagnosis not present

## 2014-04-01 DIAGNOSIS — E785 Hyperlipidemia, unspecified: Secondary | ICD-10-CM | POA: Diagnosis not present

## 2014-04-01 DIAGNOSIS — M8580 Other specified disorders of bone density and structure, unspecified site: Secondary | ICD-10-CM | POA: Insufficient documentation

## 2014-04-01 DIAGNOSIS — Z79899 Other long term (current) drug therapy: Secondary | ICD-10-CM | POA: Diagnosis not present

## 2014-04-01 DIAGNOSIS — K219 Gastro-esophageal reflux disease without esophagitis: Secondary | ICD-10-CM | POA: Diagnosis not present

## 2014-04-01 DIAGNOSIS — S76012A Strain of muscle, fascia and tendon of left hip, initial encounter: Secondary | ICD-10-CM | POA: Insufficient documentation

## 2014-04-01 DIAGNOSIS — M25552 Pain in left hip: Secondary | ICD-10-CM | POA: Diagnosis present

## 2014-04-01 DIAGNOSIS — M7062 Trochanteric bursitis, left hip: Secondary | ICD-10-CM | POA: Diagnosis present

## 2014-04-01 DIAGNOSIS — S76312A Strain of muscle, fascia and tendon of the posterior muscle group at thigh level, left thigh, initial encounter: Secondary | ICD-10-CM | POA: Diagnosis not present

## 2014-04-01 HISTORY — PX: OPEN SURGICAL REPAIR OF GLUTEAL TENDON: SHX5995

## 2014-04-01 SURGERY — REPAIR, TENDON, GLUTEUS MEDIUS, OPEN
Anesthesia: General | Site: Hip | Laterality: Left

## 2014-04-01 MED ORDER — BUPIVACAINE HCL (PF) 0.25 % IJ SOLN
INTRAMUSCULAR | Status: AC
  Filled 2014-04-01: qty 30

## 2014-04-01 MED ORDER — EPHEDRINE SULFATE 50 MG/ML IJ SOLN
INTRAMUSCULAR | Status: AC
  Filled 2014-04-01: qty 1

## 2014-04-01 MED ORDER — GLYCOPYRROLATE 0.2 MG/ML IJ SOLN
INTRAMUSCULAR | Status: DC | PRN
  Administered 2014-04-01: 0.6 mg via INTRAVENOUS

## 2014-04-01 MED ORDER — DEXTROSE 5 % IV SOLN
500.0000 mg | Freq: Four times a day (QID) | INTRAVENOUS | Status: DC | PRN
  Administered 2014-04-01: 500 mg via INTRAVENOUS
  Filled 2014-04-01 (×2): qty 5

## 2014-04-01 MED ORDER — ONDANSETRON HCL 4 MG/2ML IJ SOLN: 4.0000 mg | Freq: Four times a day (QID) | INTRAMUSCULAR | Status: DC | PRN

## 2014-04-01 MED ORDER — PROPOFOL 10 MG/ML IV BOLUS
INTRAVENOUS | Status: DC | PRN
  Administered 2014-04-01: 180 mg via INTRAVENOUS

## 2014-04-01 MED ORDER — FENTANYL CITRATE 0.05 MG/ML IJ SOLN
INTRAMUSCULAR | Status: AC
  Filled 2014-04-01: qty 2

## 2014-04-01 MED ORDER — FENTANYL CITRATE 0.05 MG/ML IJ SOLN
INTRAMUSCULAR | Status: DC | PRN
  Administered 2014-04-01: 50 ug via INTRAVENOUS
  Administered 2014-04-01 (×2): 100 ug via INTRAVENOUS

## 2014-04-01 MED ORDER — PANTOPRAZOLE SODIUM 40 MG PO TBEC
80.0000 mg | DELAYED_RELEASE_TABLET | Freq: Every day | ORAL | Status: DC
  Administered 2014-04-02: 80 mg via ORAL
  Filled 2014-04-01: qty 2

## 2014-04-01 MED ORDER — ENOXAPARIN SODIUM 40 MG/0.4ML ~~LOC~~ SOLN
40.0000 mg | SUBCUTANEOUS | Status: DC
  Administered 2014-04-02: 40 mg via SUBCUTANEOUS
  Filled 2014-04-01 (×2): qty 0.4

## 2014-04-01 MED ORDER — PROMETHAZINE HCL 25 MG/ML IJ SOLN: 6.2500 mg | INTRAMUSCULAR | Status: DC | PRN

## 2014-04-01 MED ORDER — DEXAMETHASONE SODIUM PHOSPHATE 10 MG/ML IJ SOLN
INTRAMUSCULAR | Status: AC
  Filled 2014-04-01: qty 1

## 2014-04-01 MED ORDER — METHOCARBAMOL 500 MG PO TABS
500.0000 mg | ORAL_TABLET | Freq: Four times a day (QID) | ORAL | Status: DC | PRN
  Administered 2014-04-02: 500 mg via ORAL

## 2014-04-01 MED ORDER — MORPHINE SULFATE 2 MG/ML IJ SOLN
1.0000 mg | INTRAMUSCULAR | Status: DC | PRN
  Administered 2014-04-01: 1 mg via INTRAVENOUS
  Filled 2014-04-01: qty 1

## 2014-04-01 MED ORDER — ROCURONIUM BROMIDE 100 MG/10ML IV SOLN
INTRAVENOUS | Status: AC
  Filled 2014-04-01: qty 1

## 2014-04-01 MED ORDER — PROPOFOL 10 MG/ML IV BOLUS
INTRAVENOUS | Status: AC
  Filled 2014-04-01: qty 20

## 2014-04-01 MED ORDER — BUPIVACAINE HCL (PF) 0.25 % IJ SOLN
INTRAMUSCULAR | Status: DC | PRN
  Administered 2014-04-01: 30 mL

## 2014-04-01 MED ORDER — METOCLOPRAMIDE HCL 10 MG PO TABS: 5.0000 mg | ORAL_TABLET | Freq: Three times a day (TID) | ORAL | Status: DC | PRN

## 2014-04-01 MED ORDER — MIDAZOLAM HCL 2 MG/2ML IJ SOLN
INTRAMUSCULAR | Status: AC
  Filled 2014-04-01: qty 2

## 2014-04-01 MED ORDER — ACETAMINOPHEN 500 MG PO TABS: 500.0000 mg | ORAL_TABLET | Freq: Four times a day (QID) | ORAL | Status: DC | PRN

## 2014-04-01 MED ORDER — BUPIVACAINE LIPOSOME 1.3 % IJ SUSP
INTRAMUSCULAR | Status: DC | PRN
  Administered 2014-04-01: 20 mL

## 2014-04-01 MED ORDER — CEFAZOLIN SODIUM-DEXTROSE 2-3 GM-% IV SOLR
INTRAVENOUS | Status: AC
  Filled 2014-04-01: qty 50

## 2014-04-01 MED ORDER — FENTANYL CITRATE 0.05 MG/ML IJ SOLN
INTRAMUSCULAR | Status: AC
Start: 2014-04-01 — End: 2014-04-01
  Filled 2014-04-01: qty 5

## 2014-04-01 MED ORDER — BUPIVACAINE LIPOSOME 1.3 % IJ SUSP
20.0000 mL | Freq: Once | INTRAMUSCULAR | Status: DC
  Filled 2014-04-01: qty 20

## 2014-04-01 MED ORDER — ONDANSETRON HCL 4 MG/2ML IJ SOLN
INTRAMUSCULAR | Status: DC | PRN
  Administered 2014-04-01: 4 mg via INTRAVENOUS

## 2014-04-01 MED ORDER — CEFAZOLIN SODIUM-DEXTROSE 2-3 GM-% IV SOLR
2.0000 g | INTRAVENOUS | Status: AC
  Administered 2014-04-01: 2 g via INTRAVENOUS

## 2014-04-01 MED ORDER — CEFAZOLIN SODIUM-DEXTROSE 2-3 GM-% IV SOLR
2.0000 g | Freq: Four times a day (QID) | INTRAVENOUS | Status: AC
  Administered 2014-04-01 – 2014-04-02 (×3): 2 g via INTRAVENOUS
  Filled 2014-04-01 (×3): qty 50

## 2014-04-01 MED ORDER — SODIUM CHLORIDE 0.9 % IJ SOLN
INTRAMUSCULAR | Status: DC | PRN
  Administered 2014-04-01: 50 mL via INTRAVENOUS

## 2014-04-01 MED ORDER — EPHEDRINE SULFATE 50 MG/ML IJ SOLN
INTRAMUSCULAR | Status: DC | PRN
  Administered 2014-04-01: 10 mg via INTRAVENOUS
  Administered 2014-04-01: 5 mg via INTRAVENOUS

## 2014-04-01 MED ORDER — NEOSTIGMINE METHYLSULFATE 10 MG/10ML IV SOLN
INTRAVENOUS | Status: DC | PRN
  Administered 2014-04-01: 4 mg via INTRAVENOUS

## 2014-04-01 MED ORDER — LIDOCAINE HCL (CARDIAC) 20 MG/ML IV SOLN
INTRAVENOUS | Status: DC | PRN
  Administered 2014-04-01: 50 mg via INTRAVENOUS

## 2014-04-01 MED ORDER — SUCCINYLCHOLINE CHLORIDE 20 MG/ML IJ SOLN
INTRAMUSCULAR | Status: DC | PRN
  Administered 2014-04-01: 100 mg via INTRAVENOUS

## 2014-04-01 MED ORDER — SODIUM CHLORIDE 0.9 % IJ SOLN
INTRAMUSCULAR | Status: AC
  Filled 2014-04-01: qty 50

## 2014-04-01 MED ORDER — DOXYCYCLINE HYCLATE 100 MG PO TABS
100.0000 mg | ORAL_TABLET | Freq: Two times a day (BID) | ORAL | Status: DC
  Filled 2014-04-01 (×3): qty 1

## 2014-04-01 MED ORDER — ROCURONIUM BROMIDE 100 MG/10ML IV SOLN
INTRAVENOUS | Status: DC | PRN
  Administered 2014-04-01: 25 mg via INTRAVENOUS
  Administered 2014-04-01: 5 mg via INTRAVENOUS

## 2014-04-01 MED ORDER — DEXAMETHASONE SODIUM PHOSPHATE 10 MG/ML IJ SOLN
10.0000 mg | Freq: Once | INTRAMUSCULAR | Status: AC
  Administered 2014-04-01: 10 mg via INTRAVENOUS

## 2014-04-01 MED ORDER — OXYCODONE HCL 5 MG PO TABS
5.0000 mg | ORAL_TABLET | ORAL | Status: DC | PRN
  Administered 2014-04-01 – 2014-04-02 (×4): 10 mg via ORAL
  Filled 2014-04-01 (×4): qty 2

## 2014-04-01 MED ORDER — ACETAMINOPHEN 10 MG/ML IV SOLN
1000.0000 mg | Freq: Once | INTRAVENOUS | Status: AC
  Administered 2014-04-01: 1000 mg via INTRAVENOUS
  Filled 2014-04-01: qty 100

## 2014-04-01 MED ORDER — FLUTICASONE PROPIONATE 50 MCG/ACT NA SUSP
2.0000 | Freq: Every day | NASAL | Status: DC
  Filled 2014-04-01: qty 16

## 2014-04-01 MED ORDER — LIDOCAINE HCL (CARDIAC) 20 MG/ML IV SOLN
INTRAVENOUS | Status: AC
  Filled 2014-04-01: qty 5

## 2014-04-01 MED ORDER — METOCLOPRAMIDE HCL 5 MG/ML IJ SOLN: 5.0000 mg | Freq: Three times a day (TID) | INTRAMUSCULAR | Status: DC | PRN

## 2014-04-01 MED ORDER — SODIUM CHLORIDE 0.9 % IV SOLN
INTRAVENOUS | Status: DC
  Administered 2014-04-01: 19:00:00 via INTRAVENOUS

## 2014-04-01 MED ORDER — ONDANSETRON HCL 4 MG/2ML IJ SOLN
INTRAMUSCULAR | Status: AC
  Filled 2014-04-01: qty 2

## 2014-04-01 MED ORDER — ONDANSETRON HCL 4 MG PO TABS: 4.0000 mg | ORAL_TABLET | Freq: Four times a day (QID) | ORAL | Status: DC | PRN

## 2014-04-01 MED ORDER — FENTANYL CITRATE 0.05 MG/ML IJ SOLN
25.0000 ug | INTRAMUSCULAR | Status: DC | PRN
  Administered 2014-04-01 (×2): 50 ug via INTRAVENOUS

## 2014-04-01 MED ORDER — MIDAZOLAM HCL 5 MG/5ML IJ SOLN
INTRAMUSCULAR | Status: DC | PRN
  Administered 2014-04-01: 2 mg via INTRAVENOUS

## 2014-04-01 MED ORDER — LACTATED RINGERS IV SOLN
INTRAVENOUS | Status: DC
  Administered 2014-04-01: 1000 mL via INTRAVENOUS
  Administered 2014-04-01: 14:00:00 via INTRAVENOUS

## 2014-04-01 MED ORDER — 0.9 % SODIUM CHLORIDE (POUR BTL) OPTIME
TOPICAL | Status: DC | PRN
  Administered 2014-04-01: 1000 mL

## 2014-04-01 MED ORDER — FENOFIBRATE 160 MG PO TABS
160.0000 mg | ORAL_TABLET | Freq: Every day | ORAL | Status: DC
  Administered 2014-04-02: 160 mg via ORAL
  Filled 2014-04-01: qty 1

## 2014-04-01 MED ORDER — LACTATED RINGERS IV SOLN: INTRAVENOUS | Status: DC

## 2014-04-01 SURGICAL SUPPLY — 40 items
ANCH SUT 2 CP-2 EBND QANCHR+ (Anchor) ×2 IMPLANT
ANCHOR SUPER QUICK (Anchor) ×4 IMPLANT
BAG SPEC THK2 15X12 ZIP CLS (MISCELLANEOUS)
BAG ZIPLOCK 12X15 (MISCELLANEOUS) IMPLANT
BLADE EXTENDED COATED 6.5IN (ELECTRODE) ×2 IMPLANT
CLOSURE WOUND 1/2 X4 (GAUZE/BANDAGES/DRESSINGS) ×1
DRAPE INCISE IOBAN 66X45 STRL (DRAPES) ×3 IMPLANT
DRAPE ORTHO SPLIT 77X108 STRL (DRAPES) ×6
DRAPE POUCH INSTRU U-SHP 10X18 (DRAPES) ×3 IMPLANT
DRAPE SURG ORHT 6 SPLT 77X108 (DRAPES) ×2 IMPLANT
DRAPE U-SHAPE 47X51 STRL (DRAPES) ×3 IMPLANT
DRSG ADAPTIC 3X8 NADH LF (GAUZE/BANDAGES/DRESSINGS) ×3 IMPLANT
DRSG MEPILEX BORDER 4X8 (GAUZE/BANDAGES/DRESSINGS) ×3 IMPLANT
ELECT REM PT RETURN 9FT ADLT (ELECTROSURGICAL) ×3
ELECTRODE REM PT RTRN 9FT ADLT (ELECTROSURGICAL) ×1 IMPLANT
GAUZE SPONGE 4X4 12PLY STRL (GAUZE/BANDAGES/DRESSINGS) ×3 IMPLANT
GLOVE BIO SURGEON STRL SZ7.5 (GLOVE) ×3 IMPLANT
GLOVE BIO SURGEON STRL SZ8 (GLOVE) ×3 IMPLANT
GLOVE BIOGEL PI IND STRL 8 (GLOVE) ×2 IMPLANT
GLOVE BIOGEL PI INDICATOR 8 (GLOVE) ×4
GOWN STRL REUS W/TWL LRG LVL3 (GOWN DISPOSABLE) ×3 IMPLANT
GOWN STRL REUS W/TWL XL LVL3 (GOWN DISPOSABLE) ×3 IMPLANT
KIT BASIN OR (CUSTOM PROCEDURE TRAY) ×3 IMPLANT
MANIFOLD NEPTUNE II (INSTRUMENTS) ×3 IMPLANT
NDL SAFETY ECLIPSE 18X1.5 (NEEDLE) ×2 IMPLANT
NEEDLE HYPO 18GX1.5 SHARP (NEEDLE) ×6
NEEDLE MAYO .5 CIRCLE (NEEDLE) ×2 IMPLANT
NS IRRIG 1000ML POUR BTL (IV SOLUTION) ×3 IMPLANT
PACK TOTAL JOINT (CUSTOM PROCEDURE TRAY) ×3 IMPLANT
POSITIONER SURGICAL ARM (MISCELLANEOUS) ×3 IMPLANT
STAPLER VISISTAT 35W (STAPLE) ×2 IMPLANT
STRIP CLOSURE SKIN 1/2X4 (GAUZE/BANDAGES/DRESSINGS) ×2 IMPLANT
SUT MNCRL AB 4-0 PS2 18 (SUTURE) ×3 IMPLANT
SUT VIC AB 1 CT1 36 (SUTURE) ×4 IMPLANT
SUT VIC AB 2-0 CT1 27 (SUTURE) ×9
SUT VIC AB 2-0 CT1 TAPERPNT 27 (SUTURE) ×2 IMPLANT
SUT VLOC 180 0 24IN GS25 (SUTURE) ×3 IMPLANT
SYR 20CC LL (SYRINGE) ×3 IMPLANT
SYR 50ML LL SCALE MARK (SYRINGE) ×3 IMPLANT
TOWEL OR 17X26 10 PK STRL BLUE (TOWEL DISPOSABLE) ×6 IMPLANT

## 2014-04-01 NOTE — H&P (Signed)
CC- Tracy [name] is a 64 y.o. female who presents with left hip pain  Hip Pain: Patient complains of left hip pain. Onset of the symptoms was several years ago. Inciting event: none. Current symptoms include lateral hip pain and groin pain. Associated symptoms: none. Aggravating symptoms: lying on left side. Patient's course of pain: gradually worsening. Patient has had no prior hip problems. Evaluation to date: MRI with complete tear of gluteus medius and minimus tendons with associated muscular atrophy.  Treatment to date: she has had multiple injections in past with short term relief.  Past Medical History  Diagnosis Date  . Diverticulitis   . GERD (gastroesophageal reflux disease)   . Hyperlipidemia   . Complication of anesthesia   . PONV (postoperative nausea and vomiting)   . Bursitis of left hip   . Arthritis   . Osteopenia   . Rosacea   . Hx: UTI (urinary tract infection)   . Hyperthyroidism     Past Surgical History  Procedure Laterality Date  . Knee surgery    . Back surgery    . Laparoscopic liver cyst fenestration    . Foot surgery    . Cholecystectomy    . Tonsillectomy    . Appendectomy    . Tubal ligation      Prior to Admission medications   Medication Sig Start Date End Date Taking? Authorizing Provider  acetaminophen (TYLENOL) 500 MG tablet Take 500 mg by mouth every 6 (six) hours as needed for mild pain.   Yes Historical Provider, MD  Calcium Carbonate (CALCIUM 600 PO) Take 1 tablet by mouth 2 (two) times daily.    Yes Historical Provider, MD  Cholecalciferol (VITAMIN D3) 2000 UNITS TABS Take 1 tablet by mouth daily.    Yes Historical Provider, MD  conjugated estrogens (PREMARIN) vaginal cream Apply vaginally 2-3 times weekly as needed for vaginal irritation. Patient taking differently: Place 1 Applicatorful vaginally once a week. wednesday 09/24/13  Yes Irene Pap, NP  Cranberry (SM CRANBERRY) 300 MG tablet Take 300 mg by mouth 3 (three) times daily.    Yes Historical Provider, MD  doxycycline (VIBRA-TABS) 100 MG tablet Take 1 tablet (100 mg total) by mouth 2 (two) times daily. 03/26/14  Yes Meriam Sprague Saguier, PA-C  fenofibrate 160 MG tablet Take 160 mg by mouth daily.   Yes Historical Provider, MD  fluticasone (FLONASE) 50 MCG/ACT nasal spray Place 2 sprays into both nostrils daily. 02/19/14  Yes Meriam Sprague Saguier, PA-C  Multiple Vitamin (MULTIVITAMIN) capsule Take 1 capsule by mouth daily.     Yes Historical Provider, MD  omeprazole (PRILOSEC) 40 MG capsule Take 1 capsule (40 mg total) by mouth daily. 02/18/14  Yes [name], MD  psyllium (METAMUCIL) 58.6 % powder Take 1 packet by mouth 3 (three) times daily. 1tbsp in [place] water   Yes Historical Provider, MD  fenofibrate 160 MG tablet TAKE 1 TABLET (160 MG TOTAL) BY MOUTH DAILY. Patient not taking: Reported on 03/21/2014 03/03/14   [name], MD    Physical Examination: General appearance - alert, well appearing, and in no distress Mental status - alert, oriented to person, place, and time Chest - clear to auscultation, no wheezes, rales or rhonchi, symmetric air entry Heart - normal rate, regular rhythm, normal S1, S2, no murmurs, rubs, clicks or gallops Abdomen - soft, nontender, nondistended, no masses or organomegaly Neurological - alert, oriented, normal speech, no focal findings or movement disorder noted  A left hip exam  was performed. GENERAL: no acute distress SKIN: intact SWELLING: none WARMTH: no warmth TENDERNESS: maximal at greater trochanter ROM: normal STRENGTH: weakness in hip abduction GAIT: antalgic  ASSESSMENT:Left hip bursitis and gluteal tendon tear  Plan Left hip bursectomy with gluteal tendon repair. Discussed procedure, risks and potential complications with patient who elects to proceed.  Dione Plover Brysun Eschmann, MD    04/01/2014, 1:07 PM

## 2014-04-01 NOTE — Brief Op Note (Signed)
04/01/2014  2:29 PM  PATIENT:  Wendi Maya  64 y.o. female  PRE-OPERATIVE DIAGNOSIS:  LEFT HIP BURSITIS WITH GLUTEAL TENDON TEAR  POST-OPERATIVE DIAGNOSIS:  LEFT HIP BURSITIS WITH GLUTEAL TENDON TEAR  PROCEDURE:  Procedure(s): LEFT HIP BURSECTOMY WITH GLUTEAL TENDON REPAIR (Left)  SURGEON:  Surgeon(s) and Role:    * Gearlean Alf, MD - Primary  PHYSICIAN ASSISTANT:   ASSISTANTS: Arlee Muslim, PA-C   ANESTHESIA:   general  EBL:  Total I/O In: 1000 [I.V.:1000] Out: -   DRAINS: none   LOCAL MEDICATIONS USED:  OTHER Exparel  COUNTS:  YES  TOURNIQUET:  * No tourniquets in log *  DICTATION: .Other Dictation: Dictation Number [id number]  PLAN OF CARE: Admit for overnight observation  PATIENT DISPOSITION:  PACU - hemodynamically stable.

## 2014-04-01 NOTE — Interval H&P Note (Signed)
History and Physical Interval Note:  04/01/2014 1:11 PM  Tracy [name]  has presented today for surgery, with the diagnosis of [place]  The various methods of treatment have been discussed with the patient and family. After consideration of risks, benefits and other options for treatment, the patient has consented to  Procedure(s): [place] (Left) as a surgical intervention .  The patient's history has been reviewed, patient examined, no change in status, stable for surgery.  I have reviewed the patient's chart and labs.  Questions were answered to the patient's satisfaction.     Gearlean Alf

## 2014-04-01 NOTE — Anesthesia Preprocedure Evaluation (Addendum)
Anesthesia Evaluation  Patient identified by MRN, date of birth, ID band Patient awake    Reviewed: Allergy & Precautions, NPO status , Patient's Chart, lab work & pertinent test results  History of Anesthesia Complications (+) PONV  Airway Mallampati: II  TM Distance: >3 FB Neck ROM: Full    Dental no notable dental hx.    Pulmonary neg pulmonary ROS,  breath sounds clear to auscultation  Pulmonary exam normal       Cardiovascular negative cardio ROS  Rhythm:Regular Rate:Normal     Neuro/Psych negative neurological ROS  negative psych ROS   GI/Hepatic negative GI ROS, Neg liver ROS,   Endo/Other  negative endocrine ROS  Renal/GU negative Renal ROS  negative genitourinary   Musculoskeletal negative musculoskeletal ROS (+)   Abdominal   Peds negative pediatric ROS (+)  Hematology negative hematology ROS (+)   Anesthesia Other Findings   Reproductive/Obstetrics negative OB ROS                            Anesthesia Physical Anesthesia Plan  ASA: II  Anesthesia Plan: General   Post-op Pain Management:    Induction: Intravenous  Airway Management Planned: Oral ETT  Additional Equipment:   Intra-op Plan:   Post-operative Plan: Extubation in OR  Informed Consent: I have reviewed the patients History and Physical, chart, labs and discussed the procedure including the risks, benefits and alternatives for the proposed anesthesia with the patient or authorized representative who has indicated his/her understanding and acceptance.   Dental advisory given  Plan Discussed with: CRNA  Anesthesia Plan Comments:         Anesthesia Quick Evaluation

## 2014-04-01 NOTE — Transfer of Care (Signed)
Immediate Anesthesia Transfer of Care Note  Patient: Tracy [name]  Procedure(s) Performed: Procedure(s): LEFT HIP BURSECTOMY WITH GLUTEAL TENDON REPAIR (Left)  Patient Location: PACU  Anesthesia Type:General  Level of Consciousness: awake, alert  and oriented  Airway & Oxygen Therapy: Patient Spontanous Breathing and Patient connected to face mask oxygen  Post-op Assessment: Report given to RN and Post -op Vital signs reviewed and stable  Post vital signs: Reviewed and stable  Last Vitals: There were no vitals filed for this visit.  Complications: No apparent anesthesia complications

## 2014-04-02 ENCOUNTER — Encounter (HOSPITAL_COMMUNITY): Payer: Self-pay | Admitting: Orthopedic Surgery

## 2014-04-02 DIAGNOSIS — M71552 Other bursitis, not elsewhere classified, left hip: Secondary | ICD-10-CM | POA: Diagnosis not present

## 2014-04-02 DIAGNOSIS — M8580 Other specified disorders of bone density and structure, unspecified site: Secondary | ICD-10-CM | POA: Diagnosis not present

## 2014-04-02 DIAGNOSIS — S76012A Strain of muscle, fascia and tendon of left hip, initial encounter: Secondary | ICD-10-CM | POA: Diagnosis not present

## 2014-04-02 DIAGNOSIS — Z79899 Other long term (current) drug therapy: Secondary | ICD-10-CM | POA: Diagnosis not present

## 2014-04-02 DIAGNOSIS — E785 Hyperlipidemia, unspecified: Secondary | ICD-10-CM | POA: Diagnosis not present

## 2014-04-02 DIAGNOSIS — K219 Gastro-esophageal reflux disease without esophagitis: Secondary | ICD-10-CM | POA: Diagnosis not present

## 2014-04-02 MED ORDER — OXYCODONE HCL 5 MG PO TABS: 5.0000 mg | ORAL_TABLET | ORAL | Status: DC | PRN

## 2014-04-02 MED ORDER — ASPIRIN EC 81 MG PO TBEC: 81.0000 mg | DELAYED_RELEASE_TABLET | Freq: Every day | ORAL | Status: DC

## 2014-04-02 MED ORDER — METHOCARBAMOL 500 MG PO TABS: 500.0000 mg | ORAL_TABLET | Freq: Four times a day (QID) | ORAL | Status: DC | PRN

## 2014-04-02 NOTE — Care Management Note (Unsigned)
    Page 1 of 1   04/02/2014     11:44:51 AM CARE MANAGEMENT NOTE 04/02/2014  Patient:  Tracy [name], Tracy [name]   Account Number:  [id number]  Date Initiated:  04/02/2014  Documentation initiated by:  [facility]  Subjective/Objective Assessment:   adm: LEFT HIP BURSECTOMY WITH GLUTEAL TENDON REPAIR (Left)     Action/Plan:   discharge planning   Anticipated DC Date:  04/02/2014   Anticipated DC Plan:  [place]  CM consult      Choice offered to / List presented to:     DME arranged  Vassie Moselle      DME agency  [facility].        Status of service:  Completed, signed off Medicare Important Message given?   (If response is "NO", the following Medicare IM given date fields will be blank) Date Medicare IM given:   Medicare IM given by:   Date Additional Medicare IM given:   Additional Medicare IM given by:    Discharge Disposition:  HOME/SELF CARE  Per UR Regulation:    If discussed at Long Length of Stay Meetings, dates discussed:    Comments:  04/02/14 11;40 CM called [facility] DME rep, Tracy [name] to please deliver rolling walker to room prior to discharge today. No other CM needs were communicated.  Mariane Masters, BSN, [place].

## 2014-04-02 NOTE — Discharge Instructions (Signed)
Follow up - in two weeks. Call office for appointment at [phone]. Activity - Weight bearing as tolerated to the surgical leg.  No active abduction of the leg (no pulling leg out to the side away from the body).  Walker for first several days until comfortable ambulating. May shower starting three days following surgery. Disposition - Home Condition Upon Discharge - Good D/C Meds - See DC Summary DVT Prophylaxis - Aspirin  HOME CARE INSTRUCTIONS  Remove items at home which could result in a fall. This includes throw rugs or furniture in walking pathways.  Continue medications as instructed at time of discharge.  You may start showering three days following surgery but do not submerge the incision under water. Just pat the incision dry and apply a dry gauze dressing on daily. Sit on high chairs which makes it easier to stand.  Sit on chairs with arms. Use the chair arms to help push yourself up when arising.  You may put full weight on your legs and walk as much as is comfortable. Do not drive while taking narcotics.  Make sure you keep all of your appointments after your operation with all of your doctors and caregivers. You should call the office at the above phone number and make an appointment for approximately two weeks after the date of your surgery. Change the dressing daily and reapply a dry dressing each time. Please pick up a stool softener and laxative for home use as long as you are requiring pain medications.  ICE to the affected hip every three hours for 30 minutes at a time and then as needed for pain and swelling.  Continue to use ice on the hip for pain and swelling from surgery. You may notice swelling that will progress down to the foot and ankle.  This is normal after surgery.  Elevate the leg when you are not up walking on it.   No Active Abduction of the leg (No pulling leg out to the side away from the body).  Use your walker for first several days until comfortable  ambulating.  Pick up stool softner and laxative for home use following surgery while on pain medications. Do not submerge incision under water. Please use good hand washing techniques while changing dressing each day. May shower starting three days after surgery. Please use a clean towel to pat the incision dry following showers. Continue to use ice for pain and swelling after surgery. Do not use any lotions or creams on the incision until instructed by your surgeon.  Take an enteric coated 81 mg Aspirin daily for four weeks at home.  Postoperative Constipation Protocol  Constipation - defined medically as fewer than three stools per week and severe constipation as less than one stool per week.  One of the most common issues patients have following surgery is constipation.  Even if you have a regular bowel pattern at home, your normal regimen is likely to be disrupted due to multiple reasons following surgery.  Combination of anesthesia, postoperative narcotics, change in appetite and fluid intake all can affect your bowels.  In order to avoid complications following surgery, here are some recommendations in order to help you during your recovery period.  Colace (docusate) - Pick up an over-the-counter form of Colace or another stool softener and take twice a day as long as you are requiring postoperative pain medications.  Take with a full glass of water daily.  If you experience loose stools or diarrhea, hold the colace  until you stool forms back up.  If your symptoms do not get better within 1 week or if they get worse, check with your doctor.  Dulcolax (bisacodyl) - Pick up over-the-counter and take as directed by the product packaging as needed to assist with the movement of your bowels.  Take with a full glass of water.  Use this product as needed if not relieved by Colace only.   MiraLax (polyethylene glycol) - Pick up over-the-counter to have on hand.  MiraLax is a solution that will  increase the amount of water in your bowels to assist with bowel movements.  Take as directed and can mix with a glass of water, juice, soda, coffee, or tea.  Take if you go more than two days without a movement. Do not use MiraLax more than once per day. Call your doctor if you are still constipated or irregular after using this medication for 7 days in a row.  If you continue to have problems with postoperative constipation, please contact the office for further assistance and recommendations.  If you experience "the worst abdominal pain ever" or develop nausea or vomiting, please contact the office immediatly for further recommendations for treatment.

## 2014-04-02 NOTE — Evaluation (Signed)
Physical Therapy Evaluation Patient Details Name: Tracy [name] MRN: [id number] DOB: February 21, 1951 Today's Date: 04/02/2014   History of Present Illness  Left bursectomy and repair og gluteal tendon  Clinical Impression  Patient has practiced steps with spouse and ready for DC.    Follow Up Recommendations No PT follow up (per MD note, to get OP later.)    Equipment Recommendations  None recommended by PT    Recommendations for Other Services       Precautions / Restrictions Precautions Precautions: Posterior Hip Precaution Comments: no active abduction      Mobility  Bed Mobility Overal bed mobility: Needs Assistance Bed Mobility: Supine to Sit     Supine to sit: Min assist     General bed mobility comments: assist sliding LLeg  Transfers Overall transfer level: Needs assistance Equipment used: Rolling walker (2 wheeled) Transfers: Sit to/from Stand Sit to Stand: Min guard         General transfer comment: cues for hand and L leg position.  Ambulation/Gait Ambulation/Gait assistance: Min assist Ambulation Distance (Feet): 50 Feet Assistive device: Rolling walker (2 wheeled) Gait Pattern/deviations: Step-through pattern;Step-to pattern;Decreased dorsiflexion - left;Shuffle Gait velocity: decreased   General Gait Details: cues for safety, sequence and posture  Stairs Stairs: Yes Stairs assistance: Mod assist Stair Management: Forwards;With crutches;One rail Left;Step to pattern Number of Stairs: 4 General stair comments: aspouse present to assist. ,cues for sequence  Wheelchair Mobility    Modified Rankin (Stroke Patients Only)       Balance                                             Pertinent Vitals/Pain Pain Assessment: 0-10 Pain Score: 4  Pain Location: L hip Pain Descriptors / Indicators: Aching;Discomfort Pain Intervention(s): Monitored during session;Premedicated before session;Ice applied    Home Living  Family/patient expects to be discharged to:: Private residence Living Arrangements: Spouse/significant other Available Help at Discharge: Family Type of Home: House Home Access: Stairs to enter Entrance Stairs-Rails: None Entrance Stairs-Number of Steps: 1 Home Layout: Two level;Bed/bath upstairs Home Equipment: Walker - 2 wheels;Crutches;Bedside commode      Prior Function Level of Independence: Independent               Hand Dominance        Extremity/Trunk Assessment   Upper Extremity Assessment: Overall WFL for tasks assessed           Lower Extremity Assessment: LLE deficits/detail   LLE Deficits / Details: able to advance L leg  Cervical / Trunk Assessment: Normal  Communication   Communication: No difficulties  Cognition Arousal/Alertness: Awake/alert Behavior During Therapy: WFL for tasks assessed/performed Overall Cognitive Status: Within Functional Limits for tasks assessed                      General Comments      Exercises        Assessment/Plan    PT Assessment Patent does not need any further PT services  PT Diagnosis Difficulty walking;Acute pain   PT Problem List    PT Treatment Interventions     PT Goals (Current goals can be found in the Care Plan section) Acute Rehab PT Goals Patient Stated Goal: to go home PT Goal Formulation: All assessment and education complete, DC therapy    Frequency     Barriers  to discharge        Co-evaluation               End of Session   Activity Tolerance: Patient tolerated treatment well Patient left: with family/visitor present Nurse Communication: Mobility status    Functional Assessment Tool Used: clinical judgement Functional Limitation: Mobility: Walking and moving around Mobility: Walking and Moving Around Current Status (S9702): At least 1 percent but less than 20 percent impaired, limited or restricted Mobility: Walking and Moving Around Goal Status [phone]): At  least 1 percent but less than 20 percent impaired, limited or restricted Mobility: Walking and Moving Around Discharge Status [phone]): At least 1 percent but less than 20 percent impaired, limited or restricted    Time: 1052-1120 PT Time Calculation (min) (ACUTE ONLY): 28 min   Charges:   PT Evaluation $Initial PT Evaluation Tier I: 1 Procedure PT Treatments $Gait Training: 8-22 mins   PT G Codes:   PT G-Codes **NOT FOR INPATIENT CLASS** Functional Assessment Tool Used: clinical judgement Functional Limitation: Mobility: Walking and moving around Mobility: Walking and Moving Around Current Status (D7412): At least 1 percent but less than 20 percent impaired, limited or restricted Mobility: Walking and Moving Around Goal Status [id number]): At least 1 percent but less than 20 percent impaired, limited or restricted Mobility: Walking and Moving Around Discharge Status [phone]): At least 1 percent but less than 20 percent impaired, limited or restricted    Claretha Cooper 04/02/2014, 1:28 PM

## 2014-04-02 NOTE — Op Note (Signed)
NAMESARIA, HARAN            ACCOUNT NO.:  [id number]  MEDICAL RECORD NO.:  [id number]  LOCATION:  40                         FACILITY:  [facility]  PHYSICIAN:  Gaynelle Arabian, M.D.    DATE OF BIRTH:  03-Dec-1950  DATE OF PROCEDURE:  04/01/2014 DATE OF DISCHARGE:                              OPERATIVE REPORT   PREOPERATIVE DIAGNOSIS:  Left hip intractable bursitis with gluteal tendon tear.  POSTOPERATIVE DIAGNOSIS:  Left hip intractable bursitis with gluteal tendon tear.  PROCEDURE:  Left hip bursectomy with gluteal tendon repair.  SURGEON:  Gaynelle Arabian, M.D.  ASSISTANT:  Alexzandrew L. Perkins, P.A.C.  ANESTHESIA:  General.  ESTIMATED BLOOD LOSS:  Minimal.  DRAINS:  None.  COMPLICATIONS:  None.  CONDITION:  Stable to recovery.  BRIEF CLINICAL NOTE:  Tracy [name] is a 64 year old female, who has a long history of significant left lateral hip discomfort.  Prior to my seeing her, she had multiple cortisone injections for presumed bursitis. After a while, the injections did not work anymore.  I saw her for another opinion and noted the significant pain, and we obtained an MRI, which showed a complete tear of her gluteus medius and minimus tendons with retraction and significant atrophy.  She presents now for bursectomy and attempted tendon repair.  We did discuss that given the significant muscular atrophy, she might have a tremendous improvement in function or resolution of limp that this should help with her pain that she has been experiencing in the lateral hip.  PROCEDURE IN DETAIL:  After successful administration of general anesthetic, the patient was placed in a right lateral decubitus position with the left side up and held with the hip positioner.  The left lower extremity was isolated from her perineum with plastic drapes and prepped and draped in the usual sterile fashion.  Short lateral incision was made through a very thick layer of subcutaneous tissue  down to the fascia lata, which was incised in line with the skin incision.  There was a large thickened calcified bursa, which is removed.  I then inspected the gluteal tendons.  There was significant intact attenuation of the gluteus medius.  In addition, there was a tear in various aspects of the medius with retraction.  Tears anterior and mid portion.  There is significant atrophy associated especially with the anterior portion. I placed 2 Mitek anchors; 1 anterior and 1 central and got a very good bite of tendon and muscle.  With the central one, I was able to advance that all the way down to the greater trochanter.  Anteriorly, the tendon and muscle were both atrophied, but I was able to grab the tendon with the suture and advanced it back to the anterior aspect of the greater trochanter.  The tendons were advanced back to the trochanter and the sutures are tied down to bone and then tied to each other.  It was felt to be stable repair, but the muscle is quite atrophied.  Prior to repairing this, the gluteus minimus was identified, and there was no gross tear there.  After the repair was completed, then I injected the fascia lata, gluteal muscles, and subcu tissues with a total of 20 mL  of Exparel mixed with 30 mL of saline and then an additional 30 mL of 0.25% Marcaine into the same tissues and then closed the fascia lata with #1 Vicryl suture leaving it open an area over the tip of the greater trochanter to prevent friction.  Subcu was closed in multiple layers with #1 V-Loc, #2 Vicryl, and then subcuticular closed with running 4-0 Monocryl.  Incision was cleaned and dried.  Steri-Strips and a bulky sterile dressing applied.  She was then awakened and transported to recovery in stable condition.  Note that a surgical assistant was a medical necessity for this procedure.  We are dealing with a very thickened layer of subcu, thus appropriate retraction was paramount to getting  appropriate exposure to allow all fixation of this complex retracted tear.     Gaynelle Arabian, M.D.     FA/MEDQ  D:  04/01/2014  T:  04/02/2014  Job:  [id number]

## 2014-04-02 NOTE — Discharge Summary (Signed)
Physician Discharge Summary   Patient ID: Tracy [name] MRN: [id number] DOB/AGE: 66/08/1950 66 y.o.  Admit date: 04/01/2014 Discharge date: 04/02/2014  Primary Diagnosis:  Left hip intractable bursitis with gluteal tendon tear. Admission Diagnoses:  Past Medical History  Diagnosis Date  . Diverticulitis   . GERD (gastroesophageal reflux disease)   . Hyperlipidemia   . Complication of anesthesia   . PONV (postoperative nausea and vomiting)   . Bursitis of left hip   . Arthritis   . Osteopenia   . Rosacea   . Hx: UTI (urinary tract infection)   . Hyperthyroidism    Discharge Diagnoses:   Principal Problem:   Trochanteric bursitis of left hip  Estimated body mass index is 29.57 kg/(m^2) as calculated from the following:   Height as of this encounter: 5\' 4"  (1.626 m).   Weight as of this encounter: 78.18 kg (172 lb 5.7 oz).  Procedure(s) (LRB): LEFT HIP BURSECTOMY WITH GLUTEAL TENDON REPAIR (Left)   Consults: None  HPI: Ms. Meharg is a 64 year old female, who has a long history of significant left lateral hip discomfort. Prior to my seeing her, she had multiple cortisone injections for presumed bursitis. After a while, the injections did not work anymore. I saw her for another opinion and noted the significant pain, and we obtained an MRI, which showed a complete tear of her gluteus medius and minimus tendons with retraction and significant atrophy. She presents now for bursectomy and attempted tendon repair. We did discuss that given the significant muscular atrophy, she might have a tremendous improvement in function or resolution of limp that this should help with her pain that she has been experiencing in the lateral hip. Laboratory Data: Abstract on 03/31/2014  Component Date Value Ref Range Status  . HM Mammogram 03/20/2014 bi-rads 1   Final  Hospital Outpatient Visit on 03/27/2014  Component Date Value Ref Range Status  . WBC 03/27/2014 5.5  4.0 - 10.5 K/uL  Final  . RBC 03/27/2014 4.49  3.87 - 5.11 MIL/uL Final  . Hemoglobin 03/27/2014 13.6  12.0 - 15.0 g/dL Final  . HCT 03/27/2014 41.5  36.0 - 46.0 % Final  . MCV 03/27/2014 92.4  78.0 - 100.0 fL Final  . MCH 03/27/2014 30.3  26.0 - 34.0 pg Final  . MCHC 03/27/2014 32.8  30.0 - 36.0 g/dL Final  . RDW 03/27/2014 12.8  11.5 - 15.5 % Final  . Platelets 03/27/2014 242  150 - 400 K/uL Final  Office Visit on 03/10/2014  Component Date Value Ref Range Status  . Cholesterol 03/10/2014 198  0 - 200 mg/dL Final   ATP III Classification       Desirable:  < 200 mg/dL               Borderline High:  200 - 239 mg/dL          High:  > = 240 mg/dL  . Triglycerides 03/10/2014 122.0  0.0 - 149.0 mg/dL Final   Normal:  <150 mg/dLBorderline High:  150 - 199 mg/dL  . HDL 03/10/2014 63.90  >39.00 mg/dL Final  . VLDL 03/10/2014 24.4  0.0 - 40.0 mg/dL Final  . LDL Cholesterol 03/10/2014 110* 0 - 99 mg/dL Final  . Total CHOL/HDL Ratio 03/10/2014 3   Final                  Men          Women1/2 Average Risk     3.4  3.3Average Risk          5.0          4.42X Average Risk          9.6          7.13X Average Risk          15.0          11.0                      . NonHDL 03/10/2014 134.10   Final   NOTE:  Non-HDL goal should be 30 mg/dL higher than patient's LDL goal (i.e. LDL goal of < 70 mg/dL, would have non-HDL goal of < 100 mg/dL)  . Sodium 03/10/2014 142  135 - 145 mEq/L Final  . Potassium 03/10/2014 3.9  3.5 - 5.1 mEq/L Final  . Chloride 03/10/2014 109  96 - 112 mEq/L Final  . CO2 03/10/2014 27  19 - 32 mEq/L Final  . Glucose, Bld 03/10/2014 95  70 - 99 mg/dL Final  . BUN 03/10/2014 25* 6 - 23 mg/dL Final  . Creatinine, Ser 03/10/2014 1.0  0.4 - 1.2 mg/dL Final  . Calcium 03/10/2014 10.1  8.4 - 10.5 mg/dL Final  . GFR 03/10/2014 59.66* >60.00 mL/min Final  . Total Bilirubin 03/10/2014 0.7  0.2 - 1.2 mg/dL Final  . Bilirubin, Direct 03/10/2014 0.1  0.0 - 0.3 mg/dL Final  . Alkaline Phosphatase  03/10/2014 75  39 - 117 U/L Final  . AST 03/10/2014 31  0 - 37 U/L Final  . ALT 03/10/2014 25  0 - 35 U/L Final  . Total Protein 03/10/2014 7.2  6.0 - 8.3 g/dL Final  . Albumin 03/10/2014 4.4  3.5 - 5.2 g/dL Final  . WBC 03/10/2014 6.5  4.0 - 10.5 K/uL Final  . RBC 03/10/2014 4.60  3.87 - 5.11 Mil/uL Final  . Hemoglobin 03/10/2014 13.8  12.0 - 15.0 g/dL Final  . HCT 03/10/2014 42.2  36.0 - 46.0 % Final  . MCV 03/10/2014 91.8  78.0 - 100.0 fl Final  . MCHC 03/10/2014 32.7  30.0 - 36.0 g/dL Final  . RDW 03/10/2014 13.4  11.5 - 15.5 % Final  . Platelets 03/10/2014 226.0  150.0 - 400.0 K/uL Final  . Neutrophils Relative % 03/10/2014 58.6  43.0 - 77.0 % Final  . Lymphocytes Relative 03/10/2014 32.2  12.0 - 46.0 % Final  . Monocytes Relative 03/10/2014 7.5  3.0 - 12.0 % Final  . Eosinophils Relative 03/10/2014 1.0  0.0 - 5.0 % Final  . Basophils Relative 03/10/2014 0.7  0.0 - 3.0 % Final  . Neutro Abs 03/10/2014 3.8  1.4 - 7.7 K/uL Final  . Lymphs Abs 03/10/2014 2.1  0.7 - 4.0 K/uL Final  . Monocytes Absolute 03/10/2014 0.5  0.1 - 1.0 K/uL Final  . Eosinophils Absolute 03/10/2014 0.1  0.0 - 0.7 K/uL Final  . Basophils Absolute 03/10/2014 0.0  0.0 - 0.1 K/uL Final  . VITD 03/10/2014 43.90  30.00 - 100.00 ng/mL Final  Appointment on 02/12/2014  Component Date Value Ref Range Status  . TSH 02/12/2014 0.19* 0.35 - 4.50 uIU/mL Final  . Free T4 02/12/2014 1.36  0.60 - 1.60 ng/dL Final  . T3, Free 02/12/2014 3.0  2.3 - 4.2 pg/mL Final  . TSI 02/12/2014 22  <140 % baseline Final   Comment: Thyroid stimulating immunoglobulins (TSI) can engage the TSH receptors resulting in hyperthyroidism in Graves' disease patients. TSI levels can  be useful in monitoring the clinical outcome of Graves' disease as well as assessing the potential for hyperthyroidism from maternal-fetal transfer. TSI results greater than or equal to (>=) 140% of the Reference Control are considered positive. NOTE: A serum TSH  level greater than 350 micro-International Units/mL can interfere with the TSI bioassay and potentially give false positive results. Patients who are pregnant and are suspected of having hyperthyroidism should have both TSI and human Chorionic Gonadotropin(hCG) tests measured. A serum hCG level greater than 40,625 mIU/mL can interfere with the TSI bioassay and may give false negative results. In these patients it is recommended that a second TSI be obtained when the hCG concentration falls below 40,625 mIU/mL (usually after approximately 20-weeks gestation). The an                          alytical performance characteristics of this assay have been determined by [employer], [place], [place]. The modifications have not been cleared or approved by the FDA. This assay has been validated pursuant to the CLIA regulations and is used for clinical purposes.      X-Rays:No results found.  EKG: Orders placed or performed in visit on 03/10/14  . EKG 12-Lead     Hospital Course: Patient was admitted to [facility] and taken to the OR and underwent the above state procedure without complications.  Patient tolerated the procedure well and was later transferred to the recovery room and then to the orthopaedic floor for postoperative care.  They were given PO and IV analgesics for pain control following their surgery.  They were given 24 hours of postoperative antibiotics of  Anti-infectives    Start     Dose/Rate Route Frequency Ordered Stop   04/01/14 2200  doxycycline (VIBRA-TABS) tablet 100 mg  Status:  Discontinued     100 mg Oral 2 times daily 04/01/14 1624 04/02/14 1451   04/01/14 2000  ceFAZolin (ANCEF) IVPB 2 g/50 mL premix     2 g100 mL/hr over 30 Minutes Intravenous Every 6 hours 04/01/14 1624 04/02/14 0759   04/01/14 1315  ceFAZolin (ANCEF) IVPB 2 g/50 mL premix     2 g100 mL/hr over 30 Minutes Intravenous On call to O.R. 04/01/14 1259 04/01/14 1332       and started on DVT prophylaxis in the form of Lovenox. Patient was encouraged to get up and ambulate the day after surgery.  The patient was allowed to be WBAT with therapy. Discharge planning was consulted to help with postop disposition and equipment needs.  Patient had a decent night on the evening of surgery.  They started to get up OOB with therapy on day one.   Patient was seen in rounds and was ready to go home following morning ambulation.  Discharge home, NO HHPT Diet - Cardiac diet Follow up - in two weeks. Call office for appointment at [phone]. Activity - Weight bearing as tolerated to the surgical leg. No active abduction of the leg, (no pulling leg out to the side away from the body). Walker for first several days until comfortable ambulating. May start showering three days following surgery but do not submerge incision under water. Continue to use ice for pain and swelling from surgery.  Baby Aspirin 81 mg daily for three weeks.  Please use walker for the first couple of days until comfortable ambulating.  May start changing dressing tomorrow with dry gauze and tape.  Disposition - Home  Condition Upon Discharge - Good D/C Meds - See DC Summary DVT Prophylaxis - Aspirin (One dose of Lovenox here at the hospital and aspirin then at home).      Discharge Instructions    Call MD / Call 911    Complete by:  As directed   If you experience chest pain or shortness of breath, CALL 911 and be transported to the hospital emergency room.  If you develope a fever above 101 F, pus (white drainage) or increased drainage or redness at the wound, or calf pain, call your surgeon's office.     Change dressing    Complete by:  As directed   You may change your dressing dressing daily with sterile 4 x 4 inch gauze dressing and paper tape.  Do not submerge the incision under water.     Constipation Prevention    Complete by:  As directed   Drink plenty of fluids.  Prune juice may be  helpful.  You may use a stool softener, such as Colace (over the counter) 100 mg twice a day.  Use MiraLax (over the counter) for constipation as needed.     Diet - low sodium heart healthy    Complete by:  As directed      Discharge instructions    Complete by:  As directed   Pick up stool softner and laxative for home use following surgery while on pain medications. Do not submerge incision under water. Please use good hand washing techniques while changing dressing each day. May shower starting three days after surgery. Start changing dressing tomorrow, Thursday 04/03/2014 and then once a day Please use a clean towel to pat the incision dry following showers. Continue to use ice for pain and swelling after surgery. Do not use any lotions or creams on the incision until instructed by your surgeon.  Postoperative Constipation Protocol  Constipation - defined medically as fewer than three stools per week and severe constipation as less than one stool per week.  One of the most common issues patients have following surgery is constipation.  Even if you have a regular bowel pattern at home, your normal regimen is likely to be disrupted due to multiple reasons following surgery.  Combination of anesthesia, postoperative narcotics, change in appetite and fluid intake all can affect your bowels.  In order to avoid complications following surgery, here are some recommendations in order to help you during your recovery period.  Colace (docusate) - Pick up an over-the-counter form of Colace or another stool softener and take twice a day as long as you are requiring postoperative pain medications.  Take with a full glass of water daily.  If you experience loose stools or diarrhea, hold the colace until you stool forms back up.  If your symptoms do not get better within 1 week or if they get worse, check with your doctor.  Dulcolax (bisacodyl) - Pick up over-the-counter and take as directed by the product  packaging as needed to assist with the movement of your bowels.  Take with a full glass of water.  Use this product as needed if not relieved by Colace only.   MiraLax (polyethylene glycol) - Pick up over-the-counter to have on hand.  MiraLax is a solution that will increase the amount of water in your bowels to assist with bowel movements.  Take as directed and can mix with a glass of water, juice, soda, coffee, or tea.  Take if you go more than two days  without a movement. Do not use MiraLax more than once per day. Call your doctor if you are still constipated or irregular after using this medication for 7 days in a row.  If you continue to have problems with postoperative constipation, please contact the office for further assistance and recommendations.  If you experience "the worst abdominal pain ever" or develop nausea or vomiting, please contact the office immediatly for further recommendations for treatment.     Do not sit on low chairs, stoools or toilet seats, as it may be difficult to get up from low surfaces    Complete by:  As directed      Driving restrictions    Complete by:  As directed   No driving until released by the physician.     Increase activity slowly as tolerated    Complete by:  As directed      Lifting restrictions    Complete by:  As directed   No lifting until released by the physician.     Patient may shower    Complete by:  As directed   You may shower without a dressing once there is no drainage.  Do not wash over the wound.  If drainage remains, do not shower until drainage stops.     TED hose    Complete by:  As directed   Use stockings (TED hose) for 3 weeks on both leg(s).  You may remove them at night for sleeping.     Weight bearing as tolerated    Complete by:  As directed   Laterality:  left  Extremity:  Lower            Medication List    TAKE these medications        acetaminophen 500 MG tablet  Commonly known as:  TYLENOL  Take 500 mg  by mouth every 6 (six) hours as needed for mild pain.     aspirin EC 81 MG tablet  Take 1 tablet (81 mg total) by mouth daily. Take one a day for four weeks.     CALCIUM 600 PO  Take 1 tablet by mouth 2 (two) times daily.     conjugated estrogens vaginal cream  Commonly known as:  PREMARIN  Apply vaginally 2-3 times weekly as needed for vaginal irritation.     doxycycline 100 MG tablet  Commonly known as:  VIBRA-TABS  Take 1 tablet (100 mg total) by mouth 2 (two) times daily.     fenofibrate 160 MG tablet  Take 160 mg by mouth daily.     fenofibrate 160 MG tablet  TAKE 1 TABLET (160 MG TOTAL) BY MOUTH DAILY.     fluticasone 50 MCG/ACT nasal spray  Commonly known as:  FLONASE  Place 2 sprays into both nostrils daily.     methocarbamol 500 MG tablet  Commonly known as:  ROBAXIN  Take 1 tablet (500 mg total) by mouth every 6 (six) hours as needed for muscle spasms.     multivitamin capsule  Take 1 capsule by mouth daily.     omeprazole 40 MG capsule  Commonly known as:  PRILOSEC  Take 1 capsule (40 mg total) by mouth daily.     oxyCODONE 5 MG immediate release tablet  Commonly known as:  Oxy IR/ROXICODONE  Take 1-2 tablets (5-10 mg total) by mouth every 3 (three) hours as needed for moderate pain, severe pain or breakthrough pain.     psyllium 58.6 % powder  Commonly  known as:  METAMUCIL  Take 1 packet by mouth 3 (three) times daily. 1tbsp in [place] water     SM CRANBERRY 300 MG tablet  Generic drug:  Cranberry  Take 300 mg by mouth 3 (three) times daily.     Vitamin D3 2000 UNITS Tabs  Take 1 tablet by mouth daily.       Follow-up Information    Follow up with Gearlean Alf, MD. Schedule an appointment as soon as possible for a visit on 04/15/2014.   Specialty:  Orthopedic Surgery   Why:  Call office at [phone] to setup appointment on Tuesday 04/15/2014   Contact information:   [address] [place] [address] [phone]       Follow  up with [facility].   Why:  rolling walker   Contact information:   [place] [address] [phone]       Signed: Arlee Muslim, PA-C Orthopaedic Surgery 04/08/2014, 10:08 AM

## 2014-04-02 NOTE — Progress Notes (Signed)
   Subjective: 1 Day Post-Op Procedure(s) (LRB): LEFT HIP BURSECTOMY WITH GLUTEAL TENDON REPAIR (Left) Patient reports pain as mild.   Patient seen in rounds with Dr. Wynelle Link. Patient is well, but has had some minor complaints of pain in the hip, requiring pain medications Patient is ready to go home after one session of therapy. She will NOT need HHPT.  Objective: Vital signs in last 24 hours: Temp:  [97.4 F (36.3 C)-97.9 F (36.6 C)] 97.9 F (36.6 C) (02/03 0546) Pulse Rate:  [72-95] 76 (02/03 0546) Resp:  [12-19] 18 (02/03 0546) BP: (109-136)/(55-75) 115/56 mmHg (02/03 0546) SpO2:  [95 %-100 %] 95 % (02/03 0546) Weight:  [78.18 kg (172 lb 5.7 oz)] 78.18 kg (172 lb 5.7 oz) (02/02 1710)  Intake/Output from previous day:  Intake/Output Summary (Last 24 hours) at 04/02/14 0752 Last data filed at 04/02/14 0600  Gross per 24 hour  Intake 2867.5 ml  Output   1700 ml  Net 1167.5 ml     EXAM: General - Patient is Alert, Appropriate and Oriented Extremity - Neurovascular intact Sensation intact distally Dorsiflexion/Plantar flexion intact Dressing - clean, dry, no drainage Motor Function - intact, moving foot and toes well on exam.   Assessment/Plan: 1 Day Post-Op Procedure(s) (LRB): LEFT HIP BURSECTOMY WITH GLUTEAL TENDON REPAIR (Left) Procedure(s) (LRB): LEFT HIP BURSECTOMY WITH GLUTEAL TENDON REPAIR (Left) Past Medical History  Diagnosis Date  . Diverticulitis   . GERD (gastroesophageal reflux disease)   . Hyperlipidemia   . Complication of anesthesia   . PONV (postoperative nausea and vomiting)   . Bursitis of left hip   . Arthritis   . Osteopenia   . Rosacea   . Hx: UTI (urinary tract infection)   . Hyperthyroidism    Principal Problem:   Trochanteric bursitis of left hip  Estimated body mass index is 29.57 kg/(m^2) as calculated from the following:   Height as of this encounter: 5\' 4"  (1.626 m).   Weight as of this encounter: 78.18 kg (172 lb 5.7  oz). Up with therapy Discharge home, NO HHPT Diet - Cardiac diet Follow up - in two weeks. Call office for appointment at [phone]. Activity - Weight bearing as tolerated to the surgical leg.  No active abduction of the leg, (no pulling leg out to the side away from the body).  Walker for first several days until comfortable ambulating. May start showering three days following surgery but do not submerge incision under water. Continue to use ice for pain and swelling from surgery.  Baby Aspirin 81 mg daily for three weeks.  Please use walker for the first couple of days until comfortable ambulating.  May start changing dressing tomorrow with dry gauze and tape.  Disposition - Home Condition Upon Discharge - Good D/C Meds - See DC Summary DVT Prophylaxis - Aspirin (One dose of Lovenox here at the hospital and aspirin then at home).  Arlee Muslim, PA-C Orthopaedic Surgery 04/02/2014, 7:52 AM

## 2014-04-02 NOTE — Plan of Care (Signed)
Problem: Phase II Progression Outcomes Goal: Pain controlled Outcome: Completed/Met Date Met:  04/02/14 Pt taking 9m Oxy IR with well controlled pain. Goal: Discharge plan established Outcome: Completed/Met Date Met:  04/02/14 Pt to be discharged today.   Problem: Discharge Progression Outcomes Goal: Staples/sutures removed Outcome: Not Applicable Date Met:  027/67/01Pt to have staples in until physician removes them.

## 2014-04-02 NOTE — Anesthesia Postprocedure Evaluation (Signed)
  Anesthesia Post-op Note  Patient: Tracy [name]  Procedure(s) Performed: Procedure(s) (LRB): LEFT HIP BURSECTOMY WITH GLUTEAL TENDON REPAIR (Left)  Patient Location: PACU  Anesthesia Type: General  Level of Consciousness: awake and alert   Airway and Oxygen Therapy: Patient Spontanous Breathing  Post-op Pain: mild  Post-op Assessment: Post-op Vital signs reviewed, Patient's Cardiovascular Status Stable, Respiratory Function Stable, Patent Airway and No signs of Nausea or vomiting  Last Vitals:  Filed Vitals:   04/02/14 0939  BP: 123/55  Pulse: 76  Temp: 37.2 C  Resp: 18    Post-op Vital Signs: stable   Complications: No apparent anesthesia complications

## 2014-04-07 DIAGNOSIS — M7062 Trochanteric bursitis, left hip: Secondary | ICD-10-CM | POA: Diagnosis not present

## 2014-04-15 ENCOUNTER — Ambulatory Visit: Payer: Medicare Other | Admitting: Internal Medicine

## 2014-04-29 ENCOUNTER — Other Ambulatory Visit: Payer: Self-pay | Admitting: Dermatology

## 2014-04-29 DIAGNOSIS — D224 Melanocytic nevi of scalp and neck: Secondary | ICD-10-CM | POA: Diagnosis not present

## 2014-04-29 DIAGNOSIS — D2272 Melanocytic nevi of left lower limb, including hip: Secondary | ICD-10-CM | POA: Diagnosis not present

## 2014-04-29 DIAGNOSIS — D2271 Melanocytic nevi of right lower limb, including hip: Secondary | ICD-10-CM | POA: Diagnosis not present

## 2014-04-29 DIAGNOSIS — D225 Melanocytic nevi of trunk: Secondary | ICD-10-CM | POA: Diagnosis not present

## 2014-04-29 DIAGNOSIS — L718 Other rosacea: Secondary | ICD-10-CM | POA: Diagnosis not present

## 2014-04-29 DIAGNOSIS — C44619 Basal cell carcinoma of skin of left upper limb, including shoulder: Secondary | ICD-10-CM | POA: Diagnosis not present

## 2014-05-07 ENCOUNTER — Encounter: Payer: Self-pay | Admitting: Family Medicine

## 2014-05-09 DIAGNOSIS — C44619 Basal cell carcinoma of skin of left upper limb, including shoulder: Secondary | ICD-10-CM | POA: Diagnosis not present

## 2014-05-09 DIAGNOSIS — L718 Other rosacea: Secondary | ICD-10-CM | POA: Diagnosis not present

## 2014-05-13 DIAGNOSIS — M25561 Pain in right knee: Secondary | ICD-10-CM | POA: Diagnosis not present

## 2014-05-15 ENCOUNTER — Encounter: Payer: Self-pay | Admitting: Family Medicine

## 2014-05-26 DIAGNOSIS — M7072 Other bursitis of hip, left hip: Secondary | ICD-10-CM | POA: Diagnosis not present

## 2014-05-29 ENCOUNTER — Ambulatory Visit (INDEPENDENT_AMBULATORY_CARE_PROVIDER_SITE_OTHER): Payer: Medicare Other | Admitting: Medical

## 2014-05-29 ENCOUNTER — Encounter: Payer: Self-pay | Admitting: Medical

## 2014-05-29 VITALS — BP 113/40 | HR 71 | Temp 98.1°F | Ht 64.25 in | Wt 169.8 lb

## 2014-05-29 DIAGNOSIS — R82998 Other abnormal findings in urine: Secondary | ICD-10-CM

## 2014-05-29 DIAGNOSIS — N390 Urinary tract infection, site not specified: Secondary | ICD-10-CM | POA: Diagnosis not present

## 2014-05-29 DIAGNOSIS — M7072 Other bursitis of hip, left hip: Secondary | ICD-10-CM | POA: Diagnosis not present

## 2014-05-29 DIAGNOSIS — R350 Frequency of micturition: Secondary | ICD-10-CM

## 2014-05-29 LAB — POCT URINALYSIS DIPSTICK
Glucose, UA: NEGATIVE
Ketones, UA: NEGATIVE
NITRITE UA: NEGATIVE
PROTEIN UA: 100
UROBILINOGEN UA: 0.2
pH, UA: 6

## 2014-05-29 MED ORDER — CIPROFLOXACIN HCL 500 MG PO TABS: 500.0000 mg | ORAL_TABLET | Freq: Two times a day (BID) | ORAL | Status: DC

## 2014-05-29 MED ORDER — PHENAZOPYRIDINE HCL 200 MG PO TABS: 200.0000 mg | ORAL_TABLET | Freq: Three times a day (TID) | ORAL | Status: DC | PRN

## 2014-05-29 NOTE — Patient Instructions (Signed)
UTI (urinary tract infection) Your appear to have a urinary tract infection. I am prescribing cipro antibiotic for the probable infection. Hydrate well. I am sending out a urine culture. During the interim if your signs and symptoms worsen rather than improving please notify us. We will notify your when the culture results are back.  Follow up in 7 days or as needed.  Also rx of pyridium for pain.

## 2014-05-29 NOTE — Progress Notes (Signed)
Pre visit review using our clinic review tool, if applicable. No additional management support is needed unless otherwise documented below in the visit note. 

## 2014-05-29 NOTE — Assessment & Plan Note (Signed)
Your appear to have a urinary tract infection. I am prescribing cipro antibiotic for the probable infection. Hydrate well. I am sending out a urine culture. During the interim if your signs and symptoms worsen rather than improving please notify us. We will notify your when the culture results are back.  Follow up in 7 days or as needed.  Also rx of pyridium for pain.

## 2014-05-29 NOTE — Progress Notes (Signed)
   Subjective:    Patient ID: Makinzy Cleere, female    DOB: [birth year], 64 y.o.   MRN: [id number]  HPI   Pt in today reporting urinary symptoms since Monday.  Dysuria- yes Frequent urination-yes Hesitancy-no Suprapubic pressure-yes Fever-no chills-no Nausea-no CVA pain-no History of UTI-yes. Gross hematuria-pinkish color.   Review of Systems  Constitutional: Negative for fever, chills and fatigue.  Respiratory: Negative for cough, shortness of breath and wheezing.   Cardiovascular: Negative for chest pain and palpitations.  Gastrointestinal: Positive for abdominal pain.  Genitourinary: Positive for dysuria, urgency and frequency. Negative for difficulty urinating and dyspareunia.  Musculoskeletal: Positive for back pain.       Some but not cva. She is going to therapy for hip. Therpay causes some left lower back pain.       Objective:   Physical Exam  General  Mental Status- Alert. Orientation- Orientation x 4.   Skin General:- Normal. Moisture- Dry. Temperature- Warm.   Heart Ausculation-RRR  Lungs Ausculation- Clear, even, unlabored bilaterlly.    Abdomen Palpation/Percussion: Palpation and Percussion of the abdomen reveal- faint suprapubic Tender, No Rebound tenderness, No Rigidity(guarding), No Palpable abdominal masses and No jar tenderness. No suprapubic tenderness. Liver:-Normal. Spleen:- Normal. Other Characteristics- No Costovertebral angle tenderness- Left or Costovertebral angle tenderness- Right.  Auscultation: Auscultation of the abdomen reveals- Bowel Sounds normal.      Assessment & Plan:

## 2014-05-30 DIAGNOSIS — N390 Urinary tract infection, site not specified: Secondary | ICD-10-CM | POA: Diagnosis not present

## 2014-05-30 NOTE — Addendum Note (Signed)
Addended by: Bunnie Domino on: 05/30/2014 08:42 AM   Modules accepted: Orders

## 2014-06-01 LAB — URINE CULTURE
COLONY COUNT: NO GROWTH
Organism ID, Bacteria: NO GROWTH

## 2014-06-02 DIAGNOSIS — M7072 Other bursitis of hip, left hip: Secondary | ICD-10-CM | POA: Diagnosis not present

## 2014-06-05 ENCOUNTER — Encounter: Payer: Self-pay | Admitting: Internal Medicine

## 2014-06-05 ENCOUNTER — Ambulatory Visit (INDEPENDENT_AMBULATORY_CARE_PROVIDER_SITE_OTHER): Payer: Medicare Other | Admitting: Internal Medicine

## 2014-06-05 VITALS — BP 112/62 | HR 81 | Temp 97.9°F | Resp 12 | Wt 171.0 lb

## 2014-06-05 DIAGNOSIS — M7072 Other bursitis of hip, left hip: Secondary | ICD-10-CM | POA: Diagnosis not present

## 2014-06-05 DIAGNOSIS — E0590 Thyrotoxicosis, unspecified without thyrotoxic crisis or storm: Secondary | ICD-10-CM

## 2014-06-05 LAB — TSH: TSH: 0.19 u[IU]/mL — ABNORMAL LOW (ref 0.35–4.50)

## 2014-06-05 LAB — T4, FREE: Free T4: 1.05 ng/dL (ref 0.60–1.60)

## 2014-06-05 LAB — T3, FREE: T3, Free: 4.3 pg/mL — ABNORMAL HIGH (ref 2.3–4.2)

## 2014-06-05 NOTE — Progress Notes (Addendum)
Patient ID: Tracy [name], female   DOB: 04-30-1950, 64 y.o.   MRN: [id number]   HPI  Tracy [name] is a 64 y.o.-year-old female, returning for f/u for subclinical hyperthyroidism. Last visit 6 mo ago.  Reviewed hx: She was dx'ed with hyperthyroidism in 2009 >> seen by Dr Theda Sers >> followed by labs, no tx needed.  I reviewed pt's thyroid tests: Lab Results  Component Value Date   TSH 0.19* 02/12/2014   TSH 0.17* 11/26/2013   TSH 0.16* 07/16/2013   TSH 0.32* 01/17/2013   TSH 4.16 03/08/2012   TSH 0.32* 01/16/2012   TSH 0.740 05/05/2009   FREET4 1.36 02/12/2014   FREET4 1.20 11/26/2013   FREET4 1.11 07/17/2013   FREET4 1.16 01/17/2013    Component     Latest Ref Rng 02/12/2014  TSI     <140 % baseline 22   Pt denies feeling nodules in neck, hoarseness, dysphagia/odynophagia, SOB with lying down; she c/o: - + fatigue - + irritability - + feeling cold - no tremors - + anxiety - + palpitations - hyperdefecation - + weight gain after the Sx  She also has a history of OA - pain in shoulders, hip, knees; osteopenia; anxiety  ROS: Constitutional: see HPT, nocturia Eyes: no blurry vision, no xerophthalmia ENT: no sore throat, no nodules palpated in throat, no dysphagia/odynophagia, no hoarseness Cardiovascular: no CP/SOB/+ occasional palpitations/no leg swelling Respiratory: no cough/SOB Gastrointestinal: no N/V/D/C, + heartburn (controlled) Musculoskeletal: + both: muscle/joint aches Skin: no rashes Neurological: no tremors/numbness/tingling/dizziness  I reviewed pt's medications, allergies, PMH, social hx, family hx, and changes were documented in the history of present illness. Otherwise, unchanged from my initial visit note.   Had surgery (reattachment of the L hip tendon+ removal of bursa - Dr Maureen Ralphs.  She was on Cipro for UTI.   Past Medical History  Diagnosis Date  . Diverticulitis   . GERD (gastroesophageal reflux disease)   . Hyperlipidemia   .  Complication of anesthesia   . PONV (postoperative nausea and vomiting)   . Bursitis of left hip   . Arthritis   . Osteopenia   . Rosacea   . Hx: UTI (urinary tract infection)   . Hyperthyroidism    Past Surgical History  Procedure Laterality Date  . Knee surgery    . Back surgery    . Laparoscopic liver cyst fenestration    . Foot surgery    . Cholecystectomy    . Tonsillectomy    . Appendectomy    . Tubal ligation    . Open surgical repair of gluteal tendon Left 04/01/2014    Procedure: LEFT HIP BURSECTOMY WITH GLUTEAL TENDON REPAIR;  Surgeon: Gearlean Alf, MD;  Location: [facility];  Service: Orthopedics;  Laterality: Left;   History   Social History  . Marital Status: Married    Spouse Name: N/A    Number of Children: 2   Occupational History  . retired   Social History Main Topics  . Smoking status: Never Smoker   . Smokeless tobacco: Not on file  . Alcohol Use: vodka or wine 1-2x a week Yes  . Drug Use: No  Exercises by walking 5x a week.  Current Outpatient Prescriptions on File Prior to Visit  Medication Sig Dispense Refill  . acetaminophen (TYLENOL) 500 MG tablet Take 500 mg by mouth every 6 (six) hours as needed for mild pain.    Marland Kitchen aspirin EC 81 MG tablet Take 1 tablet (81 mg total) by mouth  daily. Take one a day for four weeks. 28 tablet   . Calcium Carbonate (CALCIUM 600 PO) Take 1 tablet by mouth 2 (two) times daily.     . Cholecalciferol (VITAMIN D3) 2000 UNITS TABS Take 1 tablet by mouth daily.     Marland Kitchen conjugated estrogens (PREMARIN) vaginal cream Apply vaginally 2-3 times weekly as needed for vaginal irritation. (Patient taking differently: Place 1 Applicatorful vaginally once a week. wednesday) 42.5 g 3  . Cranberry (SM CRANBERRY) 300 MG tablet Take 300 mg by mouth 3 (three) times daily.    Marland Kitchen doxycycline (VIBRA-TABS) 100 MG tablet Take 1 tablet (100 mg total) by mouth 2 (two) times daily. 20 tablet 0  . fenofibrate 160 MG tablet TAKE 1 TABLET (160 MG TOTAL)  BY MOUTH DAILY. 90 tablet 1  . fenofibrate 160 MG tablet Take 160 mg by mouth daily.    . fluticasone (FLONASE) 50 MCG/ACT nasal spray Place 2 sprays into both nostrils daily. 16 g 2  . methocarbamol (ROBAXIN) 500 MG tablet Take 1 tablet (500 mg total) by mouth every 6 (six) hours as needed for muscle spasms. 80 tablet 0  . Multiple Vitamin (MULTIVITAMIN) capsule Take 1 capsule by mouth daily.      Marland Kitchen omeprazole (PRILOSEC) 40 MG capsule Take 1 capsule (40 mg total) by mouth daily. 90 capsule 1  . oxyCODONE (OXY IR/ROXICODONE) 5 MG immediate release tablet Take 1-2 tablets (5-10 mg total) by mouth every 3 (three) hours as needed for moderate pain, severe pain or breakthrough pain. 90 tablet 0  . phenazopyridine (PYRIDIUM) 200 MG tablet Take 1 tablet (200 mg total) by mouth 3 (three) times daily as needed for pain. 6 tablet 0  . psyllium (METAMUCIL) 58.6 % powder Take 1 packet by mouth 3 (three) times daily. 1tbsp in [place] water    . ciprofloxacin (CIPRO) 500 MG tablet Take 1 tablet (500 mg total) by mouth 2 (two) times daily. (Patient not taking: Reported on 06/05/2014) 14 tablet 0   No current facility-administered medications on file prior to visit.   Allergies  Allergen Reactions  . Demerol [Meperidine] Nausea And Vomiting   Family History  Problem Relation Age of Onset  . Hypertension Mother   . Stroke Mother   . Cancer Neg Hx   . Diabetes Neg Hx    PE: BP 112/62 mmHg  Pulse 81  Temp(Src) 97.9 F (36.6 C) (Oral)  Resp 12  Wt 171 lb (77.565 kg)  SpO2 95% Body mass index is 29.12 kg/(m^2).  Wt Readings from Last 3 Encounters:  06/05/14 171 lb (77.565 kg)  05/29/14 169 lb 12.8 oz (77.021 kg)  03/27/14 172 lb 6 oz (78.189 kg)   Constitutional: overweight, in NAD Eyes: PERRLA, EOMI, no exophthalmos, no lid lag, no stare ENT: moist mucous membranes, no thyromegaly, no cervical lymphadenopathy Cardiovascular: RRR, No MRG Respiratory: CTA B Gastrointestinal: abdomen soft, NT, ND,  BS+ Musculoskeletal: no deformities, strength intact in all 4 Skin: moist, warm, no rashes Neurological: no tremor with outstretched hands, DTR normal in all 4  ASSESSMENT: 1. Subclinical hyperthyroidism  PLAN:  1. Patient with a history of mildly low TSH levels, with normal free T4 and free T3, with several possible thyrotoxic sxs: weight loss (resolved), occasional palpitations, fatigue. - she does not appear to have exogenous causes for the low TSH. Not taking biotin. - We again discussed that possible causes of thyrotoxicosis are:  Graves ds - but negative TSIs Thyroiditis - unlikely, due to protracted  course toxic multinodular goiter/ toxic adenoma (I cannot feel nodules at palpation of her thyroid). - we reviewed her latest thyroid tests from December of last year, which appear improved. That is the reason why we did not continue with the uptake and scan as we discussed, but we planned to recheck the thyroid tests at today's visit - We will check TSH, fT3 and fT4 today >> we may need to obtain an uptake and scan to differentiate between the 3 above possible etiologies. If the thyroid scan shows a toxic adenoma >> we can use a low dose RAI to ablate it. - I do not feel that we need to add beta blockers at this time, since she is not tachycardic, anxious, or tremulous - RTC in 6 months, but likely sooner for repeat labs  Office Visit on 06/05/2014  Component Date Value Ref Range Status  . TSH 06/05/2014 0.19* 0.35 - 4.50 uIU/mL Final  . Free T4 06/05/2014 1.05  0.60 - 1.60 ng/dL Final  . T3, Free 06/05/2014 4.3* 2.3 - 4.2 pg/mL Final   TSH still low, but now fT3 high >> will get a thyroid uptake and scan.  CLINICAL DATA: Hyperthyroidism.  EXAM: THYROID SCAN AND UPTAKE - 24 HOURS  TECHNIQUE: Following the per oral administration of I-131 sodium iodide, the patient returned at 24 hours and uptake measurements were acquired with the uptake probe centered on the neck. Thyroid  imaging was performed following the intravenous administration of the Tc-62m Pertechnetate.  RADIOPHARMACEUTICALS: 11.0 microCuries I-131 sodium iodide orally and 9.7 mCi Technetium-30m pertechnetate IV  COMPARISON: None  FINDINGS: The 24 hour radioactive iodine uptake is equal to 7.9%  There is faint radiotracer activity within both lobes of the thyroid gland. A cold defect is identified within the midpole of the left lobe.  IMPRESSION: 1. 24 hour radioactive iodine uptake is below the normal limits it 7.9%. 2. Focal cold defect within the mid left lobe of thyroid gland may represent a cyst or solid nodule. Correlation with thyroid sonogram.   Electronically Signed By: Kerby Moors M.D. On: 07/23/2014 14:51  Radioactive iodine uptake not elevated. Patient has a cold defect in the left lobe >> will need a thyroid ultrasound to better characterize this lesion.  CLINICAL DATA: 64 year old female with sub clinical hyperthyroidism and cold defect on recent nuclear medicine uptake study.  EXAM: THYROID ULTRASOUND  TECHNIQUE: Ultrasound examination of the thyroid gland and adjacent soft tissues was performed.  COMPARISON: Nuclear medicine uptake study 07/23/2014  FINDINGS: Right thyroid lobe  Measurements: 6.1 x 1.4 x 1.6 cm. Heterogeneous thyroid parenchyma. No discrete nodule identified.  Left thyroid lobe  Measurements: 6.2 x 2.0 x 2.5 cm. Heterogeneous thyroid parenchyma. Solid hypoechoic nodule in the left midpole corresponding with the cold defect on the recent thyroid uptake study. This nodule measures 21 x 15 x 26 mm.  Isthmus  Thickness: 0.6 cm. No nodules visualized.  Lymphadenopathy  None visualized.  IMPRESSION: There is a 2.6 cm solid hypoechoic nodule in the left mid thyroid gland which corresponds to the cold nodule seen on recent nuclear medicine thyroid uptake study.  Findings meet consensus criteria for biopsy.  Ultrasound-guided fine needle aspiration should be considered, as per the consensus statement: Management of Thyroid Nodules Detected at [name]: Society of Radiologists in [place]. Radiology 2005; [id number].   Electronically Signed By: Jacqulynn Cadet M.D. On: 07/29/2014 16:15  Large nodule in the left thyroid lobe >> will suggest biopsy.   Adequacy Reason Satisfactory  For Evaluation. Diagnosis THYROID, LEFT MID POLE, FINE NEEDLE ASPIRATION (SPECIMEN 1 OF 1 COLLECTED 08/06/2014): FINDINGS CONSISTENT WITH BENIGN THYROID NODULE ([place] CATEGORY II). Willeen Niece MD Pathologist, Electronic Signature (Case signed 08/07/2014) Specimen Clinical Information Solid hypoechoic in the left midpole corresponding with the cold defect on the recent thyroid uptake study. This nodule measures 21 x 15 x 26 mm Source Thyroid, Fine Needle Aspiration, Left Midpole, (Specimen 1 of 1, collected  Benign pathology.   Uptake not elevated >> for now, we will only monitor the TFTs >> recheck in another month. Will advise her to not take MVI and Flonase before labs.

## 2014-06-05 NOTE — Patient Instructions (Signed)
Please stop at the lab.  Please come back for a follow-up appointment in 6 months.  

## 2014-06-06 ENCOUNTER — Encounter: Payer: Self-pay | Admitting: Internal Medicine

## 2014-06-10 DIAGNOSIS — M25561 Pain in right knee: Secondary | ICD-10-CM | POA: Diagnosis not present

## 2014-06-10 DIAGNOSIS — M7072 Other bursitis of hip, left hip: Secondary | ICD-10-CM | POA: Diagnosis not present

## 2014-06-12 DIAGNOSIS — M7072 Other bursitis of hip, left hip: Secondary | ICD-10-CM | POA: Diagnosis not present

## 2014-06-13 ENCOUNTER — Encounter: Payer: Self-pay | Admitting: Internal Medicine

## 2014-06-17 ENCOUNTER — Encounter: Payer: Self-pay | Admitting: Internal Medicine

## 2014-06-17 DIAGNOSIS — M7072 Other bursitis of hip, left hip: Secondary | ICD-10-CM | POA: Diagnosis not present

## 2014-06-21 ENCOUNTER — Encounter: Payer: Self-pay | Admitting: Internal Medicine

## 2014-06-23 ENCOUNTER — Encounter: Payer: Self-pay | Admitting: *Deleted

## 2014-06-25 ENCOUNTER — Encounter (HOSPITAL_COMMUNITY): Payer: Medicare Other

## 2014-06-26 ENCOUNTER — Encounter (HOSPITAL_COMMUNITY): Payer: Medicare Other

## 2014-07-01 DIAGNOSIS — M7072 Other bursitis of hip, left hip: Secondary | ICD-10-CM | POA: Diagnosis not present

## 2014-07-21 DIAGNOSIS — M25561 Pain in right knee: Secondary | ICD-10-CM | POA: Diagnosis not present

## 2014-07-22 ENCOUNTER — Encounter (HOSPITAL_COMMUNITY)
Admission: RE | Admit: 2014-07-22 | Discharge: 2014-07-22 | Disposition: A | Discharge status: Home / Self Care | Payer: Medicare Other | Source: Ambulatory Visit | Attending: Internal Medicine | Admitting: Internal Medicine

## 2014-07-22 DIAGNOSIS — E0590 Thyrotoxicosis, unspecified without thyrotoxic crisis or storm: Secondary | ICD-10-CM | POA: Insufficient documentation

## 2014-07-23 ENCOUNTER — Encounter (HOSPITAL_COMMUNITY)
Admission: RE | Admit: 2014-07-23 | Discharge: 2014-07-23 | Disposition: A | Discharge status: Home / Self Care | Payer: Medicare Other | Source: Ambulatory Visit | Attending: Internal Medicine | Admitting: Internal Medicine

## 2014-07-23 ENCOUNTER — Encounter (HOSPITAL_COMMUNITY): Payer: Self-pay

## 2014-07-23 DIAGNOSIS — E0590 Thyrotoxicosis, unspecified without thyrotoxic crisis or storm: Secondary | ICD-10-CM | POA: Diagnosis not present

## 2014-07-23 MED ORDER — SODIUM IODIDE I 131 CAPSULE
11.0000 | Freq: Once | INTRAVENOUS | Status: AC | PRN
  Administered 2014-07-22: 11 via ORAL

## 2014-07-23 MED ORDER — SODIUM PERTECHNETATE TC 99M INJECTION
9.7000 | Freq: Once | INTRAVENOUS | Status: AC | PRN
  Administered 2014-07-23: 9.7 via INTRAVENOUS

## 2014-07-24 ENCOUNTER — Encounter: Payer: Self-pay | Admitting: Internal Medicine

## 2014-07-24 NOTE — Addendum Note (Signed)
Addended by: [name] on: 07/24/2014 12:46 PM   Modules accepted: Orders, Level of Service

## 2014-07-29 ENCOUNTER — Ambulatory Visit
Admission: RE | Admit: 2014-07-29 | Discharge: 2014-07-29 | Disposition: A | Discharge status: Home / Self Care | Payer: Medicare Other | Source: Ambulatory Visit | Attending: Internal Medicine | Admitting: Internal Medicine

## 2014-07-29 ENCOUNTER — Encounter: Payer: Self-pay | Admitting: Internal Medicine

## 2014-07-29 DIAGNOSIS — E0590 Thyrotoxicosis, unspecified without thyrotoxic crisis or storm: Secondary | ICD-10-CM | POA: Diagnosis not present

## 2014-07-29 DIAGNOSIS — E041 Nontoxic single thyroid nodule: Secondary | ICD-10-CM | POA: Diagnosis not present

## 2014-07-29 NOTE — Addendum Note (Signed)
Addended by: [name] on: 07/29/2014 05:39 PM   Modules accepted: Level of Service

## 2014-07-30 DIAGNOSIS — M25561 Pain in right knee: Secondary | ICD-10-CM | POA: Diagnosis not present

## 2014-07-30 NOTE — Addendum Note (Signed)
Addended by: [name] on: 07/30/2014 10:05 AM   Modules accepted: Orders

## 2014-07-31 ENCOUNTER — Encounter: Payer: Self-pay | Admitting: Internal Medicine

## 2014-08-05 DIAGNOSIS — M1711 Unilateral primary osteoarthritis, right knee: Secondary | ICD-10-CM | POA: Diagnosis not present

## 2014-08-05 DIAGNOSIS — M25561 Pain in right knee: Secondary | ICD-10-CM | POA: Diagnosis not present

## 2014-08-06 ENCOUNTER — Other Ambulatory Visit (HOSPITAL_COMMUNITY)
Admission: RE | Admit: 2014-08-06 | Discharge: 2014-08-06 | Disposition: A | Discharge status: Home / Self Care | Payer: Medicare Other | Source: Ambulatory Visit | Attending: Interventional Radiology | Admitting: Interventional Radiology

## 2014-08-06 ENCOUNTER — Ambulatory Visit
Admission: RE | Admit: 2014-08-06 | Discharge: 2014-08-06 | Disposition: A | Discharge status: Home / Self Care | Payer: Medicare Other | Source: Ambulatory Visit | Attending: Internal Medicine | Admitting: Internal Medicine

## 2014-08-06 ENCOUNTER — Other Ambulatory Visit: Payer: Self-pay | Admitting: *Deleted

## 2014-08-06 DIAGNOSIS — E0590 Thyrotoxicosis, unspecified without thyrotoxic crisis or storm: Secondary | ICD-10-CM | POA: Diagnosis not present

## 2014-08-06 DIAGNOSIS — E041 Nontoxic single thyroid nodule: Secondary | ICD-10-CM | POA: Diagnosis not present

## 2014-08-07 NOTE — Addendum Note (Signed)
Addended by: [name] on: 08/07/2014 05:02 PM   Modules accepted: Orders, Level of Service

## 2014-08-12 ENCOUNTER — Other Ambulatory Visit: Payer: Self-pay | Admitting: Family Medicine

## 2014-08-12 ENCOUNTER — Encounter: Payer: Self-pay | Admitting: Family Medicine

## 2014-08-12 NOTE — Telephone Encounter (Signed)
Med filled.  

## 2014-08-13 ENCOUNTER — Other Ambulatory Visit: Payer: Medicare Other

## 2014-08-18 ENCOUNTER — Other Ambulatory Visit: Payer: Self-pay | Admitting: General Practice

## 2014-08-18 MED ORDER — FENOFIBRATE 160 MG PO TABS: 160.0000 mg | ORAL_TABLET | Freq: Every day | ORAL | Status: DC

## 2014-08-25 ENCOUNTER — Encounter: Payer: Self-pay | Admitting: Family Medicine

## 2014-08-25 ENCOUNTER — Other Ambulatory Visit: Payer: Self-pay

## 2014-08-27 ENCOUNTER — Encounter: Payer: Self-pay | Admitting: Family Medicine

## 2014-08-27 ENCOUNTER — Telehealth: Payer: Self-pay | Admitting: Family Medicine

## 2014-08-27 NOTE — Telephone Encounter (Signed)
Caller name: Teghan Relation to pt: Call back number: [phone] Pharmacy:  Reason for call:   Patient has an appointment with Dr. Birdie Riddle on 7/11. She wants to know if she needs to see the dr or just labs?

## 2014-08-27 NOTE — Telephone Encounter (Signed)
Informed patient of this.  °

## 2014-08-27 NOTE — Telephone Encounter (Signed)
Pt needs to see the provider. Was also notified of this on my chart earlier.

## 2014-09-04 DIAGNOSIS — M1711 Unilateral primary osteoarthritis, right knee: Secondary | ICD-10-CM | POA: Diagnosis not present

## 2014-09-06 ENCOUNTER — Encounter: Payer: Self-pay | Admitting: Internal Medicine

## 2014-09-08 ENCOUNTER — Ambulatory Visit (INDEPENDENT_AMBULATORY_CARE_PROVIDER_SITE_OTHER): Payer: Medicare Other | Admitting: Family Medicine

## 2014-09-08 ENCOUNTER — Other Ambulatory Visit: Payer: Medicare Other

## 2014-09-08 ENCOUNTER — Encounter: Payer: Self-pay | Admitting: Family Medicine

## 2014-09-08 VITALS — BP 122/82 | HR 76 | Temp 98.0°F | Resp 16 | Ht 64.0 in | Wt 170.1 lb

## 2014-09-08 DIAGNOSIS — E0590 Thyrotoxicosis, unspecified without thyrotoxic crisis or storm: Secondary | ICD-10-CM

## 2014-09-08 DIAGNOSIS — E781 Pure hyperglyceridemia: Secondary | ICD-10-CM

## 2014-09-08 DIAGNOSIS — Z23 Encounter for immunization: Secondary | ICD-10-CM | POA: Diagnosis not present

## 2014-09-08 LAB — LIPID PANEL
CHOL/HDL RATIO: 3
CHOLESTEROL: 186 mg/dL (ref 0–200)
HDL: 73.8 mg/dL (ref >39.00)
LDL Cholesterol: 90 mg/dL (ref 0–99)
NonHDL: 112.2
TRIGLYCERIDES: 111 mg/dL (ref 0.0–149.0)
VLDL: 22.2 mg/dL (ref 0.0–40.0)

## 2014-09-08 LAB — BASIC METABOLIC PANEL
BUN: 16 mg/dL (ref 6–23)
CALCIUM: 10.1 mg/dL (ref 8.4–10.5)
CO2: 29 mEq/L (ref 19–32)
CREATININE: 0.8 mg/dL (ref 0.40–1.20)
Chloride: 105 mEq/L (ref 96–112)
GFR: 76.17 mL/min (ref >60.00)
Glucose, Bld: 91 mg/dL (ref 70–99)
Potassium: 4 mEq/L (ref 3.5–5.1)
Sodium: 141 mEq/L (ref 135–145)

## 2014-09-08 LAB — HEPATIC FUNCTION PANEL
ALBUMIN: 4.3 g/dL (ref 3.5–5.2)
ALT: 25 U/L (ref 0–35)
AST: 28 U/L (ref 0–37)
Alkaline Phosphatase: 79 U/L (ref 39–117)
BILIRUBIN DIRECT: 0.1 mg/dL (ref 0.0–0.3)
BILIRUBIN TOTAL: 0.5 mg/dL (ref 0.2–1.2)
Total Protein: 7.4 g/dL (ref 6.0–8.3)

## 2014-09-08 LAB — TSH: TSH: 0.32 u[IU]/mL — ABNORMAL LOW (ref 0.35–4.50)

## 2014-09-08 LAB — T4, FREE: FREE T4: 1.14 ng/dL (ref 0.60–1.60)

## 2014-09-08 LAB — T3, FREE: T3, Free: 3.7 pg/mL (ref 2.3–4.2)

## 2014-09-08 NOTE — Progress Notes (Signed)
   Subjective:    Patient ID: Tracy [name], female    DOB: 1950-08-20, 64 y.o.   MRN: [id number]  HPI hyperlipidemia- chronic problem, on Fenofibrate daily.  Denies CP, SOB, HAs, visual changes, edema.  No abd pain, N/V, myalgias.  Very limited walking due to multiple orthopedic issues.  Thyroid issues- following w/ Dr Cruzita Lederer, is supposed to have labs today but has another engagement.  Will obtain labs for pt here today.  Review of Systems For ROS see HPI     Objective:   Physical Exam  Constitutional: She is oriented to person, place, and time. She appears well-developed and well-nourished. No distress.  HENT:  Head: Normocephalic and atraumatic.  Eyes: Conjunctivae and EOM are normal. Pupils are equal, round, and reactive to light.  Neck: Normal range of motion. Neck supple. No thyromegaly present.  Cardiovascular: Normal rate, regular rhythm, normal heart sounds and intact distal pulses.   No murmur heard. Pulmonary/Chest: Effort normal and breath sounds normal. No respiratory distress.  Abdominal: Soft. She exhibits no distension. There is no tenderness.  Musculoskeletal: She exhibits no edema.  Lymphadenopathy:    She has no cervical adenopathy.  Neurological: She is alert and oriented to person, place, and time.  Skin: Skin is warm and dry.  Psychiatric: She has a normal mood and affect. Her behavior is normal.  Vitals reviewed.         Assessment & Plan:

## 2014-09-08 NOTE — Addendum Note (Signed)
Addended by: Kris Hartmann on: 09/08/2014 09:16 AM   Modules accepted: Orders

## 2014-09-08 NOTE — Progress Notes (Signed)
Pre visit review using our clinic review tool, if applicable. No additional management support is needed unless otherwise documented below in the visit note. 

## 2014-09-08 NOTE — Assessment & Plan Note (Signed)
Chronic problem.  Tolerating fenofibrate w/o difficulty.  Unable to exercise due to multiple orthopedic issues.  Stressed need for healthy diet.  Check labs.  Adjust meds prn

## 2014-09-08 NOTE — Assessment & Plan Note (Signed)
Following w/ Dr Cruzita Lederer.  Due for labs today but has to be at a funeral later this afternoon.  Will get labs here in office and forward to treating provider.

## 2014-09-08 NOTE — Patient Instructions (Signed)
Schedule your complete physical in 6 months We'll notify you of your lab results and make any changes if needed Keep up the good work!  You look great! Call with any questions or concerns Have a great summer! Hang in there!!!  You're doing great!

## 2014-09-11 DIAGNOSIS — M1711 Unilateral primary osteoarthritis, right knee: Secondary | ICD-10-CM | POA: Diagnosis not present

## 2014-09-15 ENCOUNTER — Encounter: Payer: Self-pay | Admitting: Internal Medicine

## 2014-09-18 ENCOUNTER — Ambulatory Visit (INDEPENDENT_AMBULATORY_CARE_PROVIDER_SITE_OTHER): Payer: Medicare Other | Admitting: Medical

## 2014-09-18 ENCOUNTER — Encounter: Payer: Self-pay | Admitting: Medical

## 2014-09-18 VITALS — BP 129/74 | HR 80 | Temp 97.8°F | Ht 64.0 in | Wt 174.6 lb

## 2014-09-18 DIAGNOSIS — N390 Urinary tract infection, site not specified: Secondary | ICD-10-CM | POA: Diagnosis not present

## 2014-09-18 DIAGNOSIS — R350 Frequency of micturition: Secondary | ICD-10-CM | POA: Diagnosis not present

## 2014-09-18 DIAGNOSIS — M1711 Unilateral primary osteoarthritis, right knee: Secondary | ICD-10-CM | POA: Diagnosis not present

## 2014-09-18 LAB — POCT URINALYSIS DIPSTICK

## 2014-09-18 MED ORDER — SULFAMETHOXAZOLE-TRIMETHOPRIM 800-160 MG PO TABS: 1.0000 | ORAL_TABLET | Freq: Two times a day (BID) | ORAL | Status: DC

## 2014-09-18 NOTE — Progress Notes (Signed)
Subjective:    Patient ID: Tracy [name], female    DOB: 06/24/1950, 64 y.o.   MRN: [id number]  HPI  Pt in stating on Tuesday she felt some burning on urination. Pt urinating more frequently every our on hour. Every time she was urinating had dysuria. She still feels pain despite 2 azostandard.  Pt tried estrogen cream and it did not help at all. She was hoping vaginal atrophy. But pain is from urethra.  No nausea, no vomiting, no fevers, no chills, no sweats and no back pain. No odor to urine.  Pt requested bactrim.    Review of Systems  Constitutional: Negative for fever, chills and fatigue.  Gastrointestinal: Negative for nausea, vomiting and abdominal pain.  Genitourinary: Positive for dysuria and frequency. Negative for vaginal pain and pelvic pain.       Suprapubic pressure sensation.  Musculoskeletal: Negative for back pain.  Neurological: Negative for dizziness and headaches.  Hematological: Negative for adenopathy. Does not bruise/bleed easily.  Psychiatric/Behavioral: Negative for behavioral problems and confusion.    Past Medical History  Diagnosis Date  . Diverticulitis   . GERD (gastroesophageal reflux disease)   . Hyperlipidemia   . Complication of anesthesia   . PONV (postoperative nausea and vomiting)   . Bursitis of left hip   . Arthritis   . Osteopenia   . Rosacea   . Hx: UTI (urinary tract infection)   . Hyperthyroidism     History   Social History  . Marital Status: Married    Spouse Name: N/A  . Number of Children: N/A  . Years of Education: N/A   Occupational History  . Not on file.   Social History Main Topics  . Smoking status: Never Smoker   . Smokeless tobacco: Not on file  . Alcohol Use: Yes     Comment: OCCASIONAL  . Drug Use: No  . Sexual Activity: Not on file   Other Topics Concern  . Not on file   Social History Narrative    Past Surgical History  Procedure Laterality Date  . Knee surgery    . Back surgery    .  Laparoscopic liver cyst fenestration    . Foot surgery    . Cholecystectomy    . Tonsillectomy    . Appendectomy    . Tubal ligation    . Open surgical repair of gluteal tendon Left 04/01/2014    Procedure: LEFT HIP BURSECTOMY WITH GLUTEAL TENDON REPAIR;  Surgeon: Gearlean Alf, MD;  Location: [facility];  Service: Orthopedics;  Laterality: Left;    Family History  Problem Relation Age of Onset  . Hypertension Mother   . Stroke Mother   . Cancer Neg Hx   . Diabetes Neg Hx     Allergies  Allergen Reactions  . Demerol [Meperidine] Nausea And Vomiting    Current Outpatient Prescriptions on File Prior to Visit  Medication Sig Dispense Refill  . acetaminophen (TYLENOL) 500 MG tablet Take 500 mg by mouth every 6 (six) hours as needed for mild pain.    . Calcium Carbonate (CALCIUM 600 PO) Take 1 tablet by mouth 2 (two) times daily.     . Cholecalciferol (VITAMIN D3) 2000 UNITS TABS Take 1 tablet by mouth daily.     Marland Kitchen conjugated estrogens (PREMARIN) vaginal cream Apply vaginally 2-3 times weekly as needed for vaginal irritation. (Patient taking differently: Place 1 Applicatorful vaginally once a week. wednesday) 42.5 g 3  . Cranberry (SM CRANBERRY) 300 MG  tablet Take 300 mg by mouth 3 (three) times daily.    Marland Kitchen doxycycline (VIBRA-TABS) 100 MG tablet Take 1 tablet (100 mg total) by mouth 2 (two) times daily. 20 tablet 0  . fenofibrate 160 MG tablet Take 1 tablet (160 mg total) by mouth daily. 90 tablet 0  . fluticasone (FLONASE) 50 MCG/ACT nasal spray Place 2 sprays into both nostrils daily. 16 g 2  . Multiple Vitamin (MULTIVITAMIN) capsule Take 1 capsule by mouth daily.      Marland Kitchen omeprazole (PRILOSEC) 40 MG capsule Take 1 capsule (40 mg total) by mouth daily. 90 capsule 1  . psyllium (METAMUCIL) 58.6 % powder Take 1 packet by mouth 3 (three) times daily. 1tbsp in [place] water     No current facility-administered medications on file prior to visit.    BP 129/74 mmHg  Pulse 80  Temp(Src) 97.8 F  (36.6 C) (Oral)  Ht 5\' 4"  (1.626 m)  Wt 174 lb 9.6 oz (79.198 kg)  BMI 29.96 kg/m2  SpO2 95%       Objective:   Physical Exam  General  Mental Status- Alert. Orientation- Orientation x 4.   Skin General:- Normal. Moisture- Dry. Temperature- Warm.  HEENT Head- normal.  Neck Neck- Supple.  Heart Ausculation-RRR  Lungs Ausculation- Clear, even, unlabored bilaterlly.    Abdomen Palpation/Percussion: Palpation and Percussion of the abdomen reveal- mild  Tender suprapubic region, No Rebound tenderness, No Rigidity(guarding), No Palpable abdominal masses and No jar tenderness. No suprapubic tenderness. Liver:-Normal. Spleen:- Normal. Other Characteristics- No Costovertebral angle tenderness- Left or Costovertebral angle tenderness- Right.  Auscultation: Auscultation of the abdomen reveals- Bowel Sounds normal.  Back- no cva tenderness     Assessment & Plan:  UTI- by symptom description and exam. UA dip obscured due to azo. But am sending out culture. During the interim rx bactrim DS.   Pending cultures if symptoms change or worsen notify us.  Follow up in 7 days or as needed

## 2014-09-18 NOTE — Addendum Note (Signed)
Addended by: Bunnie Domino on: 09/18/2014 04:38 PM   Modules accepted: Orders

## 2014-09-18 NOTE — Progress Notes (Signed)
Pre visit review using our clinic review tool, if applicable. No additional management support is needed unless otherwise documented below in the visit note. 

## 2014-09-18 NOTE — Patient Instructions (Signed)
UTI- by symptom description and exam. UA dip obscured due to azo. But am sending out culture. During the interim rx bactrim DS.   Pending cultures if symptoms change or worsen notify us.  Follow up in 7 days or as needed

## 2014-09-20 LAB — URINE CULTURE: Colony Count: 40000

## 2014-09-21 ENCOUNTER — Encounter: Payer: Self-pay | Admitting: Medical

## 2014-10-22 DIAGNOSIS — M2031 Hallux varus (acquired), right foot: Secondary | ICD-10-CM | POA: Diagnosis not present

## 2014-10-22 DIAGNOSIS — M2041 Other hammer toe(s) (acquired), right foot: Secondary | ICD-10-CM | POA: Diagnosis not present

## 2014-10-30 DIAGNOSIS — M1711 Unilateral primary osteoarthritis, right knee: Secondary | ICD-10-CM | POA: Diagnosis not present

## 2014-11-10 DIAGNOSIS — H25013 Cortical age-related cataract, bilateral: Secondary | ICD-10-CM | POA: Diagnosis not present

## 2014-11-10 DIAGNOSIS — H2513 Age-related nuclear cataract, bilateral: Secondary | ICD-10-CM | POA: Diagnosis not present

## 2014-11-26 ENCOUNTER — Other Ambulatory Visit: Payer: Self-pay | Admitting: Family Medicine

## 2014-11-26 MED ORDER — FENOFIBRATE 160 MG PO TABS: 160.0000 mg | ORAL_TABLET | Freq: Every day | ORAL | Status: DC

## 2014-11-26 NOTE — Telephone Encounter (Signed)
Medication filled to pharmacy as requested.   

## 2014-12-04 ENCOUNTER — Ambulatory Visit (INDEPENDENT_AMBULATORY_CARE_PROVIDER_SITE_OTHER): Payer: Medicare Other | Admitting: Family Medicine

## 2014-12-04 ENCOUNTER — Encounter: Payer: Self-pay | Admitting: Family Medicine

## 2014-12-04 VITALS — BP 129/82 | HR 72 | Temp 98.2°F | Resp 16 | Wt 175.4 lb

## 2014-12-04 DIAGNOSIS — N390 Urinary tract infection, site not specified: Secondary | ICD-10-CM

## 2014-12-04 DIAGNOSIS — R300 Dysuria: Secondary | ICD-10-CM | POA: Diagnosis not present

## 2014-12-04 DIAGNOSIS — R82998 Other abnormal findings in urine: Secondary | ICD-10-CM

## 2014-12-04 DIAGNOSIS — R319 Hematuria, unspecified: Secondary | ICD-10-CM | POA: Diagnosis not present

## 2014-12-04 LAB — POCT URINALYSIS DIPSTICK
Bilirubin, UA: NEGATIVE
Glucose, UA: NEGATIVE
Nitrite, UA: NEGATIVE
UROBILINOGEN UA: 0.2
pH, UA: 5.5

## 2014-12-04 MED ORDER — CEPHALEXIN 500 MG PO CAPS: 500.0000 mg | ORAL_CAPSULE | Freq: Two times a day (BID) | ORAL | Status: AC

## 2014-12-04 NOTE — Progress Notes (Signed)
   Subjective:    Patient ID: Tracy [name], female    DOB: Dec 24, 1950, 64 y.o.   MRN: [id number]  HPI Dysuria- sxs started on Friday.  Pt initially thought this was due to estrogen cream but sxs have been worsening rather than improving.  Increased frequency.  + urgency.  Sensation of incomplete emptying.  Some LBP over the weekend.  No fevers.   Review of Systems For ROS see HPI     Objective:   Physical Exam  Constitutional: She is oriented to person, place, and time. She appears well-developed and well-nourished. No distress.  HENT:  Head: Normocephalic and atraumatic.  Abdominal: Soft. She exhibits no distension. There is no tenderness (no suprapubic or CVA tenderness).  Neurological: She is alert and oriented to person, place, and time.  Skin: Skin is warm and dry.  Psychiatric: She has a normal mood and affect. Her behavior is normal. Thought content normal.  Vitals reviewed.         Assessment & Plan:

## 2014-12-04 NOTE — Progress Notes (Signed)
Pre visit review using our clinic review tool, if applicable. No additional management support is needed unless otherwise documented below in the visit note. 

## 2014-12-04 NOTE — Assessment & Plan Note (Signed)
Pt's sxs and UA are again consistent w/ UTI.  Start abx.  Increase fluids.  Reviewed supportive care and red flags that should prompt return.  Pt expressed understanding and is in agreement w/ plan.

## 2014-12-04 NOTE — Addendum Note (Signed)
Addended by: Harl Bowie on: 12/04/2014 04:50 PM   Modules accepted: Orders

## 2014-12-04 NOTE — Patient Instructions (Signed)
Follow up as scheduled Start the Keflex twice daily Drink plenty of fluids Call with any questions or concerns Hang in there! If you want to join [name] at the new [place] office, any scheduled appointments will automatically transfer and we will see you at [address] [name] [address] [place], [place],  [phone]  Have a great weekend!!!

## 2014-12-05 LAB — URINE CULTURE
COLONY COUNT: NO GROWTH
ORGANISM ID, BACTERIA: NO GROWTH

## 2014-12-08 ENCOUNTER — Telehealth: Payer: Self-pay | Admitting: *Deleted

## 2014-12-08 DIAGNOSIS — R319 Hematuria, unspecified: Secondary | ICD-10-CM

## 2014-12-08 NOTE — Telephone Encounter (Signed)
-----   Message from [name], MD sent at 12/07/2014 11:48 AM EDT ----- No evidence of UTI.  Based on this, please come in for a urine only lab visit so we can repeat your urine dip and make sure there is no longer any blood present.  If there is still blood in the 2nd specimen, we'll have you see urology.

## 2014-12-08 NOTE — Telephone Encounter (Signed)
Patient informed, understood & agreed; lab appt scheduled 12/09/14 at 10am; new lab order placed/SLS .

## 2014-12-09 ENCOUNTER — Other Ambulatory Visit (INDEPENDENT_AMBULATORY_CARE_PROVIDER_SITE_OTHER): Payer: Medicare Other

## 2014-12-09 ENCOUNTER — Encounter: Payer: Self-pay | Admitting: Family Medicine

## 2014-12-09 DIAGNOSIS — R319 Hematuria, unspecified: Secondary | ICD-10-CM | POA: Diagnosis not present

## 2014-12-09 DIAGNOSIS — R82998 Other abnormal findings in urine: Secondary | ICD-10-CM

## 2014-12-09 LAB — URINALYSIS, ROUTINE W REFLEX MICROSCOPIC
Bilirubin Urine: NEGATIVE
Hgb urine dipstick: NEGATIVE
Ketones, ur: NEGATIVE
Leukocytes, UA: NEGATIVE
Nitrite: NEGATIVE
PH: 5.5 (ref 5.0–8.0)
RBC / HPF: NONE SEEN (ref 0–?)
Specific Gravity, Urine: >=1.03 — AB (ref 1.000–1.030)
TOTAL PROTEIN, URINE-UPE24: NEGATIVE
Urine Glucose: NEGATIVE
Urobilinogen, UA: 0.2 (ref 0.0–1.0)

## 2014-12-10 ENCOUNTER — Ambulatory Visit (INDEPENDENT_AMBULATORY_CARE_PROVIDER_SITE_OTHER): Payer: Medicare Other | Admitting: Internal Medicine

## 2014-12-10 ENCOUNTER — Encounter: Payer: Self-pay | Admitting: Internal Medicine

## 2014-12-10 VITALS — BP 120/70 | HR 69 | Temp 98.4°F | Resp 12 | Wt 177.4 lb

## 2014-12-10 DIAGNOSIS — E0590 Thyrotoxicosis, unspecified without thyrotoxic crisis or storm: Secondary | ICD-10-CM | POA: Diagnosis not present

## 2014-12-10 DIAGNOSIS — E041 Nontoxic single thyroid nodule: Secondary | ICD-10-CM | POA: Insufficient documentation

## 2014-12-10 LAB — TSH: TSH: 0.34 u[IU]/mL — ABNORMAL LOW (ref 0.35–4.50)

## 2014-12-10 LAB — T4, FREE: Free T4: 1.22 ng/dL (ref 0.60–1.60)

## 2014-12-10 LAB — T3, FREE: T3, Free: 3.7 pg/mL (ref 2.3–4.2)

## 2014-12-10 NOTE — Progress Notes (Signed)
Patient ID: Tracy [name], female   DOB: 10/25/1950, 64 y.o.   MRN: [id number]   HPI  Tracy [name] is a 64 y.o.-year-old female, returning for f/u for subclinical hyperthyroidism and a cold thyroid nodule. Last visit 6 mo ago.  She had a recent UTI >> started penicillin. She had repeated UTIs in the past  - takes Cranberry tablets. She had calcium oxalate crystals in >> suspicion for kidney stones >> will see urology soon.   Reviewed hx: She was dx'ed with hyperthyroidism in 2009 >> seen by Dr Theda Sers >> followed by labs, no tx needed.  I reviewed pt's thyroid tests: Lab Results  Component Value Date   TSH 0.32* 09/08/2014   TSH 0.19* 06/05/2014   TSH 0.19* 02/12/2014   TSH 0.17* 11/26/2013   TSH 0.16* 07/16/2013   TSH 0.32* 01/17/2013   TSH 4.16 03/08/2012   TSH 0.32* 01/16/2012   TSH 0.740 05/05/2009   FREET4 1.14 09/08/2014   FREET4 1.05 06/05/2014   FREET4 1.36 02/12/2014   FREET4 1.20 11/26/2013   FREET4 1.11 07/17/2013   FREET4 1.16 01/17/2013    Component     Latest Ref Rng 02/12/2014  TSI     <140 % baseline 22   We checked a thyroid uptake and scan (07/23/2014) >> uptake not elevated (7.9%), but a cold nodule noticed in the L lobe.  Thyroid U/S (07/28/2013): 21 x 15 x 26 mm L nodule  FNA nodule (08/07/2014): benign cytology  Pt denies feeling nodules in neck, hoarseness, dysphagia/odynophagia, SOB with lying down. She can have occasional choking after the FNA.   She c/o: - no fatigue - no weight gain/loss - No heat or cold intolerance - no tremors - No anxiety - Resolved palpitations - hyperdefecation  She also has a history of OA - pain in shoulders, hip, knees; osteopenia; anxiety  ROS: Constitutional: see HPI, + nocturia Eyes: no blurry vision, no xerophthalmia ENT: no sore throat, no nodules palpated in throat, no dysphagia/odynophagia, no hoarseness;  Cardiovascular: no CP/SOB/palpitations/no leg swelling Respiratory: no  cough/SOB Gastrointestinal: no N/V/D/C, + heartburn (controlled) Musculoskeletal: + both: muscle/joint aches Skin: no rashes Neurological: no tremors/numbness/tingling/dizziness  I reviewed pt's medications, allergies, PMH, social hx, family hx, and changes were documented in the history of present illness. Otherwise, unchanged from my initial visit note.   Past Medical History  Diagnosis Date  . Diverticulitis   . GERD (gastroesophageal reflux disease)   . Hyperlipidemia   . Complication of anesthesia   . PONV (postoperative nausea and vomiting)   . Bursitis of left hip   . Arthritis   . Osteopenia   . Rosacea   . Hx: UTI (urinary tract infection)   . Hyperthyroidism    Past Surgical History  Procedure Laterality Date  . Knee surgery    . Back surgery    . Laparoscopic liver cyst fenestration    . Foot surgery    . Cholecystectomy    . Tonsillectomy    . Appendectomy    . Tubal ligation    . Open surgical repair of gluteal tendon Left 04/01/2014    Procedure: LEFT HIP BURSECTOMY WITH GLUTEAL TENDON REPAIR;  Surgeon: Gearlean Alf, MD;  Location: [facility];  Service: Orthopedics;  Laterality: Left;   History   Social History  . Marital Status: Married    Spouse Name: N/A    Number of Children: 2   Occupational History  . retired   Social History Main Topics  .  Smoking status: Never Smoker   . Smokeless tobacco: Not on file  . Alcohol Use: vodka or wine 1-2x a week Yes  . Drug Use: No  Exercises by walking 5x a week.  Current Outpatient Prescriptions on File Prior to Visit  Medication Sig Dispense Refill  . Calcium Carbonate (CALCIUM 600 PO) Take 1 tablet by mouth 2 (two) times daily.     . cephALEXin (KEFLEX) 500 MG capsule Take 1 capsule (500 mg total) by mouth 2 (two) times daily. 10 capsule 0  . Cholecalciferol (VITAMIN D3) 2000 UNITS TABS Take 1 tablet by mouth daily.     Marland Kitchen conjugated estrogens (PREMARIN) vaginal cream Apply vaginally 2-3 times weekly as  needed for vaginal irritation. (Patient taking differently: Place 1 Applicatorful vaginally once a week. wednesday) 42.5 g 3  . Cranberry (SM CRANBERRY) 300 MG tablet Take 300 mg by mouth 3 (three) times daily.    Marland Kitchen doxycycline (VIBRA-TABS) 100 MG tablet Take 1 tablet (100 mg total) by mouth 2 (two) times daily. 20 tablet 0  . fenofibrate 160 MG tablet Take 1 tablet (160 mg total) by mouth daily. 90 tablet 0  . fluticasone (FLONASE) 50 MCG/ACT nasal spray Place 2 sprays into both nostrils daily. 16 g 2  . ibuprofen (ADVIL,MOTRIN) 200 MG tablet Take 200 mg by mouth every 6 (six) hours as needed.    . Multiple Vitamin (MULTIVITAMIN) capsule Take 1 capsule by mouth daily.      Marland Kitchen omeprazole (PRILOSEC) 40 MG capsule Take 1 capsule (40 mg total) by mouth daily. 90 capsule 1  . psyllium (METAMUCIL) 58.6 % powder Take 1 packet by mouth 3 (three) times daily. 1tbsp in [place] water    . sulfamethoxazole-trimethoprim (BACTRIM DS,SEPTRA DS) 800-160 MG per tablet Take 1 tablet by mouth 2 (two) times daily. 14 tablet 0   No current facility-administered medications on file prior to visit.   Allergies  Allergen Reactions  . Demerol [Meperidine] Nausea And Vomiting   Family History  Problem Relation Age of Onset  . Hypertension Mother   . Stroke Mother   . Cancer Neg Hx   . Diabetes Neg Hx    PE: BP 120/70 mmHg  Pulse 69  Temp(Src) 98.4 F (36.9 C) (Oral)  Resp 12  Wt 177 lb 6.4 oz (80.468 kg)  SpO2 95% Body mass index is 30.44 kg/(m^2).  Wt Readings from Last 3 Encounters:  12/10/14 177 lb 6.4 oz (80.468 kg)  12/04/14 175 lb 6 oz (79.55 kg)  09/18/14 174 lb 9.6 oz (79.198 kg)   Constitutional: overweight, in NAD Eyes: PERRLA, EOMI ENT: moist mucous membranes, no thyromegaly, no cervical lymphadenopathy Cardiovascular: RRR, No MRG Respiratory: CTA B Gastrointestinal: abdomen soft, NT, ND, BS+ Musculoskeletal: no deformities, strength intact in all 4 Skin: moist, warm, no  rashes Neurological: no tremor with outstretched hands, DTR normal in all 4  ASSESSMENT: 1. Subclinical hyperthyroidism  2. Thyroid nodule, cold  CLINICAL DATA: Hyperthyroidism.  EXAM: THYROID SCAN AND UPTAKE - 24 HOURS  TECHNIQUE: Following the per oral administration of I-131 sodium iodide, the patient returned at 24 hours and uptake measurements were acquired with the uptake probe centered on the neck. Thyroid imaging was performed following the intravenous administration of the Tc-48m Pertechnetate.  RADIOPHARMACEUTICALS: 11.0 microCuries I-131 sodium iodide orally and 9.7 mCi Technetium-70m pertechnetate IV  COMPARISON: None  FINDINGS: The 24 hour radioactive iodine uptake is equal to 7.9%  There is faint radiotracer activity within both lobes of the thyroid  gland. A cold defect is identified within the midpole of the left lobe.  IMPRESSION: 1. 24 hour radioactive iodine uptake is below the normal limits it 7.9%. 2. Focal cold defect within the mid left lobe of thyroid gland may represent a cyst or solid nodule. Correlation with thyroid sonogram.   Electronically Signed By: Kerby Moors M.D. On: 07/23/2014 14:51  Radioactive iodine uptake not elevated. Cold defect in the left lobe >> ordered a thyroid ultrasound to better characterize this lesion.  CLINICAL DATA: 64 year old female with sub clinical hyperthyroidism and cold defect on recent nuclear medicine uptake study.  EXAM: THYROID ULTRASOUND  TECHNIQUE: Ultrasound examination of the thyroid gland and adjacent soft tissues was performed.  COMPARISON: Nuclear medicine uptake study 07/23/2014  FINDINGS: Right thyroid lobe  Measurements: 6.1 x 1.4 x 1.6 cm. Heterogeneous thyroid parenchyma. No discrete nodule identified.  Left thyroid lobe  Measurements: 6.2 x 2.0 x 2.5 cm. Heterogeneous thyroid parenchyma. Solid hypoechoic nodule in the left midpole corresponding with the cold  defect on the recent thyroid uptake study. This nodule measures 21 x 15 x 26 mm.  Isthmus  Thickness: 0.6 cm. No nodules visualized.  Lymphadenopathy  None visualized.  IMPRESSION: There is a 2.6 cm solid hypoechoic nodule in the left mid thyroid gland which corresponds to the cold nodule seen on recent nuclear medicine thyroid uptake study.  Findings meet consensus criteria for biopsy. Ultrasound-guided fine needle aspiration should be considered, as per the consensus statement: Management of Thyroid Nodules Detected at [name]: Society of Radiologists in [place]. Radiology 2005; [id number].   Electronically Signed By: Jacqulynn Cadet M.D. On: 07/29/2014 16:15  Large nodule in the left thyroid lobe >> suggested biopsy.   Adequacy Reason Satisfactory For Evaluation. Diagnosis THYROID, LEFT MID POLE, FINE NEEDLE ASPIRATION (SPECIMEN 1 OF 1 COLLECTED 08/06/2014): FINDINGS CONSISTENT WITH BENIGN THYROID NODULE (B[place] CATEGORY II). Willeen Niece MD Pathologist, Electronic Signature (Case signed 08/07/2014) Specimen Clinical Information Solid hypoechoic in the left midpole corresponding with the cold defect on the recent thyroid uptake study. This nodule measures 21 x 15 x 26 mm Source Thyroid, Fine Needle Aspiration, Left Midpole, (Specimen 1 of 1, collected  Benign pathology.   PLAN:  1. Patient with a history of mildly low TSH levels, with normal free T4 and free T3, with several possible thyrotoxic sxs: weight loss (resolved), occasional palpitations (resolved), fatigue (resolved). - she does not appear to have exogenous causes for the low TSH. Not taking biotin. - The reason for her thyrotoxicosis are not very clear - her uptake was slightly low, at 7.9% (10-30%). His rules out Graves' disease and thyroiditis, but also toxic adenoma or toxic MNG... - we reviewed her latest thyroid tests from 10/2013, which appear improved.  - We  will check TSH, fT3 and fT4 today  - I do not feel that we need to add beta blockers at this time, since she is not tachycardic, anxious, or tremulous - RTC in 1 year  2. Thyroid nodule - Patient had a large cold thyroid nodule on her uptake and scan, which was biopsied in 07/2014 and the biopsy was benign. We reviewed the results of the ultrasound and the biopsy. - We'll continue to follow this nodule, but no intervention is needed for now.  Office Visit on 12/10/2014  Component Date Value Ref Range Status  . TSH 12/10/2014 0.34* 0.35 - 4.50 uIU/mL Final  . Free T4 12/10/2014 1.22  0.60 - 1.60 ng/dL Final  . T3, Free  12/10/2014 3.7  2.3 - 4.2 pg/mL Final   TFTs improved. No intervention needed. Will continue to monitor TFTs.

## 2014-12-10 NOTE — Telephone Encounter (Signed)
Referral placed today

## 2014-12-10 NOTE — Patient Instructions (Signed)
Please stop at the lab.  Please return in 1 year.  Hyperthyroidism Hyperthyroidism is when the thyroid is too active (overactive). Your thyroid is a large gland that is located in your neck. The thyroid helps to control how your body uses food (metabolism). When your thyroid is overactive, it produces too much of a hormone called thyroxine.  CAUSES Causes of hyperthyroidism may include:  Graves disease. This is when your immune system attacks the thyroid gland. This is the most common cause.  Inflammation of the thyroid gland.  Tumor in the thyroid gland or somewhere else.  Excessive use of thyroid medicines, including:  Prescription thyroid supplement.  Herbal supplements that mimic thyroid hormones.  Solid or fluid-filled lumps within your thyroid gland (thyroid nodules).  Excessive ingestion of iodine. RISK FACTORS  Being female.  Having a family history of thyroid conditions. SIGNS AND SYMPTOMS Signs and symptoms of hyperthyroidism may include:  Nervousness.  Inability to tolerate heat.  Unexplained weight loss.  Diarrhea.  Change in the texture of hair or skin.  Heart skipping beats or making extra beats.  Rapid heart rate.  Loss of menstruation.  Shaky hands.  Fatigue.  Restlessness.  Increased appetite.  Sleep problems.  Enlarged thyroid gland or nodules. DIAGNOSIS  Diagnosis of hyperthyroidism may include:  Medical history and physical exam.  Blood tests.  Ultrasound tests. TREATMENT Treatment may include:  Medicines to control your thyroid.  Surgery to remove your thyroid.  Radiation therapy. HOME CARE INSTRUCTIONS   Take medicines only as directed by your health care provider.  Do not use any tobacco products, including cigarettes, chewing tobacco, or electronic cigarettes. If you need help quitting, ask your health care provider.  Do not exercise or do physical activity until your health care provider approves.  Keep all  follow-up appointments as directed by your health care provider. This is important. SEEK MEDICAL CARE IF:  Your symptoms do not get better with treatment.  You have fever.  You are taking thyroid replacement medicine and you:  Have depression.  Feel mentally and physically slow.  Have weight gain. SEEK IMMEDIATE MEDICAL CARE IF:   You have decreased alertness or a change in your awareness.  You have abdominal pain.  You feel dizzy.  You have a rapid heartbeat.  You have an irregular heartbeat.   This information is not intended to replace advice given to you by your health care provider. Make sure you discuss any questions you have with your health care provider.   Document Released: 02/14/2005 Document Revised: 03/07/2014 Document Reviewed: 07/02/2013 Elsevier Interactive Patient Education [employer].

## 2014-12-11 ENCOUNTER — Encounter: Payer: Self-pay | Admitting: Family Medicine

## 2014-12-11 ENCOUNTER — Encounter: Payer: Self-pay | Admitting: Internal Medicine

## 2015-02-04 ENCOUNTER — Encounter: Payer: Self-pay | Admitting: Family Medicine

## 2015-02-04 DIAGNOSIS — R351 Nocturia: Secondary | ICD-10-CM | POA: Diagnosis not present

## 2015-02-04 DIAGNOSIS — R350 Frequency of micturition: Secondary | ICD-10-CM | POA: Diagnosis not present

## 2015-02-04 DIAGNOSIS — R3915 Urgency of urination: Secondary | ICD-10-CM | POA: Diagnosis not present

## 2015-02-04 MED ORDER — OMEPRAZOLE 40 MG PO CPDR: 40.0000 mg | DELAYED_RELEASE_CAPSULE | Freq: Every day | ORAL | Status: DC

## 2015-02-04 NOTE — Telephone Encounter (Signed)
Medication filled to pharmacy as requested.   

## 2015-03-03 ENCOUNTER — Encounter: Payer: Self-pay | Admitting: Family Medicine

## 2015-03-03 ENCOUNTER — Other Ambulatory Visit: Payer: Self-pay | Admitting: Family Medicine

## 2015-03-03 MED ORDER — FENOFIBRATE 160 MG PO TABS: 160.0000 mg | ORAL_TABLET | Freq: Every day | ORAL | Status: DC

## 2015-03-03 NOTE — Telephone Encounter (Signed)
Medication filled to pharmacy as requested.   

## 2015-03-13 ENCOUNTER — Encounter: Payer: Self-pay | Admitting: Family Medicine

## 2015-03-13 ENCOUNTER — Ambulatory Visit (INDEPENDENT_AMBULATORY_CARE_PROVIDER_SITE_OTHER): Payer: Medicare Other | Admitting: Family Medicine

## 2015-03-13 VITALS — BP 132/84 | HR 73 | Temp 98.0°F | Ht 64.0 in | Wt 173.6 lb

## 2015-03-13 DIAGNOSIS — Z1159 Encounter for screening for other viral diseases: Secondary | ICD-10-CM | POA: Diagnosis not present

## 2015-03-13 DIAGNOSIS — E781 Pure hyperglyceridemia: Secondary | ICD-10-CM | POA: Diagnosis not present

## 2015-03-13 DIAGNOSIS — Z0000 Encounter for general adult medical examination without abnormal findings: Secondary | ICD-10-CM

## 2015-03-13 DIAGNOSIS — E0590 Thyrotoxicosis, unspecified without thyrotoxic crisis or storm: Secondary | ICD-10-CM | POA: Diagnosis not present

## 2015-03-13 DIAGNOSIS — M8580 Other specified disorders of bone density and structure, unspecified site: Secondary | ICD-10-CM | POA: Diagnosis not present

## 2015-03-13 LAB — CBC WITH DIFFERENTIAL/PLATELET
BASOS ABS: 0 10*3/uL (ref 0.0–0.1)
Basophils Relative: 0.6 % (ref 0.0–3.0)
EOS PCT: 3.2 % (ref 0.0–5.0)
Eosinophils Absolute: 0.2 10*3/uL (ref 0.0–0.7)
HCT: 41.9 % (ref 36.0–46.0)
Hemoglobin: 14.1 g/dL (ref 12.0–15.0)
LYMPHS ABS: 1.9 10*3/uL (ref 0.7–4.0)
Lymphocytes Relative: 36.4 % (ref 12.0–46.0)
MCHC: 33.5 g/dL (ref 30.0–36.0)
MCV: 91.4 fl (ref 78.0–100.0)
MONOS PCT: 10.6 % (ref 3.0–12.0)
Monocytes Absolute: 0.6 10*3/uL (ref 0.1–1.0)
NEUTROS ABS: 2.6 10*3/uL (ref 1.4–7.7)
NEUTROS PCT: 49.2 % (ref 43.0–77.0)
PLATELETS: 217 10*3/uL (ref 150.0–400.0)
RBC: 4.59 Mil/uL (ref 3.87–5.11)
RDW: 12.8 % (ref 11.5–15.5)
WBC: 5.3 10*3/uL (ref 4.0–10.5)

## 2015-03-13 LAB — BASIC METABOLIC PANEL
BUN: 27 mg/dL — AB (ref 6–23)
CALCIUM: 9.9 mg/dL (ref 8.4–10.5)
CHLORIDE: 108 meq/L (ref 96–112)
CO2: 27 mEq/L (ref 19–32)
CREATININE: 0.84 mg/dL (ref 0.40–1.20)
GFR: 71.89 mL/min (ref >60.00)
Glucose, Bld: 95 mg/dL (ref 70–99)
Potassium: 3.8 mEq/L (ref 3.5–5.1)
Sodium: 143 mEq/L (ref 135–145)

## 2015-03-13 LAB — HEPATIC FUNCTION PANEL
ALBUMIN: 4.1 g/dL (ref 3.5–5.2)
ALT: 18 U/L (ref 0–35)
AST: 23 U/L (ref 0–37)
Alkaline Phosphatase: 69 U/L (ref 39–117)
Bilirubin, Direct: 0.1 mg/dL (ref 0.0–0.3)
Total Bilirubin: 0.5 mg/dL (ref 0.2–1.2)
Total Protein: 7 g/dL (ref 6.0–8.3)

## 2015-03-13 LAB — LIPID PANEL
CHOL/HDL RATIO: 3
Cholesterol: 194 mg/dL (ref 0–200)
HDL: 66.9 mg/dL (ref >39.00)
LDL CALC: 105 mg/dL — AB (ref 0–99)
NonHDL: 126.87
TRIGLYCERIDES: 108 mg/dL (ref 0.0–149.0)
VLDL: 21.6 mg/dL (ref 0.0–40.0)

## 2015-03-13 LAB — VITAMIN D 25 HYDROXY (VIT D DEFICIENCY, FRACTURES): VITD: 54.61 ng/mL (ref 30.00–100.00)

## 2015-03-13 LAB — TSH: TSH: 0.37 u[IU]/mL (ref 0.35–4.50)

## 2015-03-13 MED ORDER — FLUTICASONE PROPIONATE 50 MCG/ACT NA SUSP: 2.0000 | Freq: Every day | NASAL | Status: DC

## 2015-03-13 NOTE — Assessment & Plan Note (Signed)
Chronic problem.  Tolerating fenofibrate w/o difficulty.  Again stressed the importance of healthy diet and regular exercise.  Check labs.  Adjust meds prn

## 2015-03-13 NOTE — Patient Instructions (Signed)
Follow up in 6 months to recheck cholesterol We'll notify you of your lab results and make any changes if needed Continue to work on healthy diet and regular exercise- you look great! Schedule your mammogram You are up to date on colonoscopy until 2022- yay!!! Call with any questions or concerns Happy Early Birthday!!!

## 2015-03-13 NOTE — Assessment & Plan Note (Signed)
Pt's PE WNL.  UTD on colonoscopy.  Due for mammo- pt to schedule.  UTD on DEXA.  No need for paps.  UTD on vaccines.  Written screening schedule updated and given to pt.  Check labs.  Anticipatory guidance provided.

## 2015-03-13 NOTE — Progress Notes (Signed)
Pre visit review using our clinic review tool, if applicable. No additional management support is needed unless otherwise documented below in the visit note. 

## 2015-03-13 NOTE — Assessment & Plan Note (Signed)
Ongoing issue.  Following w/ Dr Cruzita Lederer.  Check TSH.  Forward to Endo.

## 2015-03-13 NOTE — Progress Notes (Signed)
   Subjective:    Patient ID: Tracy [name], female    DOB: [birth year], 65 y.o.   MRN: [id number]  HPI Here today for CPE.  Risk Factors: Hypertriglyceridemia- chronic problem, on Fenofibrate Physical Activity: regular exercise Fall Risk: low Depression: denies Hearing: normal to conversational tones and whispered voice at 6 ft ADL's: independent Cognitive: normal linear thought process, memory and attention intact Home Safety: safe at home, lives w/ husband Height, Weight, BMI, Visual Acuity: see vitals, vision corrected to 20/20 w/ glasses Counseling: UTD on colonoscopy (due 2022 w/ Dr Wynetta Emery).  Due for mammo later this month.  No need for pap.  UTD on DEXA.  UTD on immunizations Care team reviewed and updated Labs Ordered: See A&P Care Plan: See A&P    Review of Systems Patient reports no vision/ hearing changes, adenopathy,fever, weight change,  persistant/recurrent hoarseness , swallowing issues, chest pain, palpitations, edema, persistant/recurrent cough, hemoptysis, dyspnea (rest/exertional/paroxysmal nocturnal), gastrointestinal bleeding (melena, rectal bleeding), abdominal pain, significant heartburn, bowel changes, GU symptoms (dysuria, hematuria, incontinence), Gyn symptoms (abnormal  bleeding, pain),  syncope, focal weakness, memory loss, numbness & tingling, skin/hair/nail changes, abnormal bruising or bleeding, anxiety, or depression.   + leg cramps nightly    Objective:   Physical Exam General Appearance:    Alert, cooperative, no distress, appears stated age  Head:    Normocephalic, without obvious abnormality, atraumatic  Eyes:    PERRL, conjunctiva/corneas clear, EOM's intact, fundi    benign, both eyes  Ears:    Normal TM's and external ear canals, both ears  Nose:   Nares normal, septum midline, mucosa normal, no drainage    or sinus tenderness  Throat:   Lips, mucosa, and tongue normal; teeth and gums normal  Neck:   Supple, symmetrical, trachea midline,  no adenopathy;    Thyroid: no enlargement/tenderness/nodules  Back:     Symmetric, no curvature, ROM normal, no CVA tenderness  Lungs:     Clear to auscultation bilaterally, respirations unlabored  Chest Wall:    No tenderness or deformity   Heart:    Regular rate and rhythm, S1 and S2 normal, no murmur, rub   or gallop  Breast Exam:    Deferred to mammo  Abdomen:     Soft, non-tender, bowel sounds active all four quadrants,    no masses, no organomegaly  Genitalia:    Deferred  Rectal:    Extremities:   Extremities normal, atraumatic, no cyanosis or edema  Pulses:   2+ and symmetric all extremities  Skin:   Skin color, texture, turgor normal, no rashes or lesions  Lymph nodes:   Cervical, supraclavicular, and axillary nodes normal  Neurologic:   CNII-XII intact, normal strength, sensation and reflexes    throughout          Assessment & Plan:

## 2015-03-13 NOTE — Assessment & Plan Note (Signed)
Chronic problem.  UTD on DEXA.  Check Vit D level.  Replete prn. 

## 2015-03-14 LAB — HEPATITIS C ANTIBODY: HCV Ab: NEGATIVE

## 2015-03-25 DIAGNOSIS — M1711 Unilateral primary osteoarthritis, right knee: Secondary | ICD-10-CM | POA: Diagnosis not present

## 2015-03-30 DIAGNOSIS — Z0000 Encounter for general adult medical examination without abnormal findings: Secondary | ICD-10-CM | POA: Diagnosis not present

## 2015-03-30 DIAGNOSIS — R350 Frequency of micturition: Secondary | ICD-10-CM | POA: Diagnosis not present

## 2015-04-02 DIAGNOSIS — Z1231 Encounter for screening mammogram for malignant neoplasm of breast: Secondary | ICD-10-CM | POA: Diagnosis not present

## 2015-04-02 DIAGNOSIS — M1711 Unilateral primary osteoarthritis, right knee: Secondary | ICD-10-CM | POA: Diagnosis not present

## 2015-04-02 LAB — HM MAMMOGRAPHY

## 2015-04-07 ENCOUNTER — Encounter: Payer: Self-pay | Admitting: General Practice

## 2015-04-10 DIAGNOSIS — M1711 Unilateral primary osteoarthritis, right knee: Secondary | ICD-10-CM | POA: Diagnosis not present

## 2015-05-12 DIAGNOSIS — L821 Other seborrheic keratosis: Secondary | ICD-10-CM | POA: Diagnosis not present

## 2015-05-12 DIAGNOSIS — Z85828 Personal history of other malignant neoplasm of skin: Secondary | ICD-10-CM | POA: Diagnosis not present

## 2015-05-12 DIAGNOSIS — L570 Actinic keratosis: Secondary | ICD-10-CM | POA: Diagnosis not present

## 2015-05-12 DIAGNOSIS — D225 Melanocytic nevi of trunk: Secondary | ICD-10-CM | POA: Diagnosis not present

## 2015-05-12 DIAGNOSIS — L438 Other lichen planus: Secondary | ICD-10-CM | POA: Diagnosis not present

## 2015-05-12 DIAGNOSIS — B078 Other viral warts: Secondary | ICD-10-CM | POA: Diagnosis not present

## 2015-05-25 ENCOUNTER — Encounter: Payer: Self-pay | Admitting: Family Medicine

## 2015-05-25 MED ORDER — FENOFIBRATE 160 MG PO TABS: 160.0000 mg | ORAL_TABLET | Freq: Every day | ORAL | Status: DC

## 2015-05-25 NOTE — Telephone Encounter (Signed)
Medication filled to pharmacy as requested.   

## 2015-05-26 DIAGNOSIS — M1711 Unilateral primary osteoarthritis, right knee: Secondary | ICD-10-CM | POA: Diagnosis not present

## 2015-05-26 DIAGNOSIS — M25511 Pain in right shoulder: Secondary | ICD-10-CM | POA: Diagnosis not present

## 2015-06-05 ENCOUNTER — Encounter: Payer: Self-pay | Admitting: Family Medicine

## 2015-06-05 ENCOUNTER — Ambulatory Visit (INDEPENDENT_AMBULATORY_CARE_PROVIDER_SITE_OTHER): Payer: Medicare Other | Admitting: Family Medicine

## 2015-06-05 VITALS — BP 121/80 | HR 76 | Temp 98.2°F | Resp 16 | Ht 64.0 in | Wt 178.1 lb

## 2015-06-05 DIAGNOSIS — Z01818 Encounter for other preprocedural examination: Secondary | ICD-10-CM | POA: Diagnosis not present

## 2015-06-05 NOTE — Progress Notes (Signed)
Pre visit review using our clinic review tool, if applicable. No additional management support is needed unless otherwise documented below in the visit note. 

## 2015-06-06 NOTE — Progress Notes (Signed)
   Subjective:    Patient ID: Tracy [name], female    DOB: June 21, 1950, 65 y.o.   MRN: [id number]  HPI Pt here today for pre-op EKG.  No complaints.   Review of Systems  No CP, SOB, HAs, visual changes, palpitations.    Objective:   Physical Exam  Constitutional: She is oriented to person, place, and time. She appears well-developed and well-nourished. No distress.  HENT:  Head: Normocephalic and atraumatic.  Neurological: She is alert and oriented to person, place, and time.  Psychiatric: She has a normal mood and affect. Her behavior is normal. Thought content normal.  Vitals reviewed.         Assessment & Plan:  Pre Op EKG performed.  Reviewed and faxed to ortho.  No need to delay surgery- pt cleared to proceed.

## 2015-07-01 ENCOUNTER — Ambulatory Visit (INDEPENDENT_AMBULATORY_CARE_PROVIDER_SITE_OTHER): Payer: Medicare Other | Admitting: Family Medicine

## 2015-07-01 ENCOUNTER — Encounter: Payer: Self-pay | Admitting: Family Medicine

## 2015-07-01 VITALS — BP 112/82 | HR 64 | Temp 98.4°F | Resp 16 | Ht 64.0 in | Wt 178.0 lb

## 2015-07-01 DIAGNOSIS — R3915 Urgency of urination: Secondary | ICD-10-CM

## 2015-07-01 DIAGNOSIS — N390 Urinary tract infection, site not specified: Secondary | ICD-10-CM | POA: Diagnosis not present

## 2015-07-01 DIAGNOSIS — R300 Dysuria: Secondary | ICD-10-CM | POA: Diagnosis not present

## 2015-07-01 DIAGNOSIS — N952 Postmenopausal atrophic vaginitis: Secondary | ICD-10-CM

## 2015-07-01 LAB — POCT URINALYSIS DIPSTICK
Bilirubin, UA: NEGATIVE
Blood, UA: NEGATIVE
GLUCOSE UA: NEGATIVE
Ketones, UA: NEGATIVE
LEUKOCYTES UA: NEGATIVE
Nitrite, UA: NEGATIVE
PROTEIN UA: NEGATIVE
UROBILINOGEN UA: 0.2
pH, UA: 5

## 2015-07-01 MED ORDER — FLUCONAZOLE 150 MG PO TABS: 150.0000 mg | ORAL_TABLET | Freq: Once | ORAL | Status: DC

## 2015-07-01 MED ORDER — ESTROGENS, CONJUGATED 0.625 MG/GM VA CREA: TOPICAL_CREAM | VAGINAL | Status: DC

## 2015-07-01 NOTE — Progress Notes (Signed)
   Subjective:    Patient ID: Tracy [name], female    DOB: [birth year], 65 y.o.   MRN: [id number]  HPI Dysuria- pt reports she had a yeast infxn a week ago and tx'd w/ topical Monistat x3 days.  Pt reports the itching resolved but she continues to have internal burning.  She states urine 'smells terrible'.  Had some brown discharge w/ wiping x1.  States the burning has continued.  Pt resumed Premarin- 'just a little dab last night'.     Review of Systems For ROS see HPI     Objective:   Physical Exam  Constitutional: She is oriented to person, place, and time. She appears well-developed and well-nourished. No distress.  HENT:  Head: Normocephalic and atraumatic.  Abdominal: Soft. She exhibits no distension. There is no tenderness (no suprapubic or CVA tenderness).  Neurological: She is alert and oriented to person, place, and time.  Psychiatric: She has a normal mood and affect. Her behavior is normal. Thought content normal.  Vitals reviewed.         Assessment & Plan:

## 2015-07-01 NOTE — Progress Notes (Signed)
Pre visit review using our clinic review tool, if applicable. No additional management support is needed unless otherwise documented below in the visit note. 

## 2015-07-01 NOTE — Patient Instructions (Signed)
Follow up as needed Start the Diflucan as directed Drink plenty of fluids If you notice the brown discharge again- let me know Continue the Premarin for the irritation and dryness as needed Call with any questions or concerns- particularly if not improving! Have a great time at the beach!!!

## 2015-07-02 NOTE — Assessment & Plan Note (Signed)
New.  Pt's UA is not indicative of infxn but given her recent vaginitis will start diflucan for suspected yeast.  Reviewed dx and tx plan w/ pt.  Pt expressed understanding and is in agreement w/ plan.

## 2015-07-05 ENCOUNTER — Encounter: Payer: Self-pay | Admitting: Family Medicine

## 2015-07-05 DIAGNOSIS — N3001 Acute cystitis with hematuria: Secondary | ICD-10-CM | POA: Diagnosis not present

## 2015-07-07 ENCOUNTER — Encounter: Payer: Self-pay | Admitting: Family Medicine

## 2015-07-13 ENCOUNTER — Encounter: Payer: Self-pay | Admitting: Family Medicine

## 2015-07-13 ENCOUNTER — Encounter: Payer: Self-pay | Admitting: Internal Medicine

## 2015-07-13 DIAGNOSIS — E038 Other specified hypothyroidism: Secondary | ICD-10-CM

## 2015-07-13 DIAGNOSIS — R252 Cramp and spasm: Secondary | ICD-10-CM

## 2015-07-17 ENCOUNTER — Encounter: Payer: Self-pay | Admitting: Family Medicine

## 2015-07-31 DIAGNOSIS — M1711 Unilateral primary osteoarthritis, right knee: Secondary | ICD-10-CM | POA: Diagnosis not present

## 2015-08-01 ENCOUNTER — Ambulatory Visit: Payer: Self-pay | Admitting: Orthopedic Surgery

## 2015-08-01 NOTE — Progress Notes (Signed)
Preoperative surgical orders have been place into the Epic hospital system for [name] on 08/01/2015, 12:47 PM  by Mickel Crow for surgery on 08/17/2015.  Preop Total Knee orders including Experal, IV Tylenol, and IV Decadron as long as there are no contraindications to the above medications. Arlee Muslim, PA-C

## 2015-08-03 ENCOUNTER — Encounter: Payer: Self-pay | Admitting: Family Medicine

## 2015-08-03 MED ORDER — OMEPRAZOLE 40 MG PO CPDR: 40.0000 mg | DELAYED_RELEASE_CAPSULE | Freq: Every day | ORAL | Status: DC

## 2015-08-03 NOTE — Telephone Encounter (Signed)
Medication filled to pharmacy as requested.   

## 2015-08-06 ENCOUNTER — Other Ambulatory Visit (HOSPITAL_COMMUNITY): Payer: Self-pay | Admitting: *Deleted

## 2015-08-06 NOTE — Progress Notes (Signed)
EKG 06-05-15 EP[facility]  MEDICAL CLEARANCE NOTE DR Birdie Riddle 06-05-15 [facility]

## 2015-08-06 NOTE — Patient Instructions (Addendum)
Tracy [name]  08/06/2015   Your procedure is scheduled on: 08-17-15  Report to [facility] Main  Entrance take [facility]  elevators to 3rd floor to  [place] at 600 AM.  Call this number if you have problems the morning of surgery [phone]   Remember: ONLY 1 PERSON MAY GO WITH YOU TO SHORT STAY TO GET  READY MORNING OF [name].  Do not eat food or drink liquids :After Midnight.     Take these medicines the morning of surgery with A SIP OF WATER: OMEPRAZOLE (PRILOSEC)                               You may not have any metal on your body including hair pins and              piercings  Do not wear jewelry, make-up, lotions, powders or perfumes, deodorant             Do not wear nail polish.  Do not shave  48 hours prior to surgery.               Do not bring valuables to the hospital. [place].  Contacts, dentures or bridgework may not be worn into surgery.  Leave suitcase in the car. After surgery it may be brought to your room.     Patients discharged the day of surgery will not be allowed to drive home.  Name and phone number of your driver:  Special Instructions: coughing and deep breathing exercises, leg exercises             Please read over the following fact sheets you were given: _____________________________________________________________________             [facility] - Preparing for Surgery Before surgery, you can play an important role.  Because skin is not sterile, your skin needs to be as free of germs as possible.  You can reduce the number of germs on your skin by washing with CHG (chlorahexidine gluconate) soap before surgery.  CHG is an antiseptic cleaner which kills germs and bonds with the skin to continue killing germs even after washing. Please DO NOT use if you have an allergy to CHG or antibacterial soaps.  If your skin becomes reddened/irritated stop using the CHG  and inform your nurse when you arrive at Short Stay. Do not shave (including legs and underarms) for at least 48 hours prior to the first CHG shower.  You may shave your face/neck. Please follow these instructions carefully:  1.  Shower with CHG Soap the night before surgery and the  morning of Surgery.  2.  If you choose to wash your hair, wash your hair first as usual with your  normal  shampoo.  3.  After you shampoo, rinse your hair and body thoroughly to remove the  shampoo.                           4.  Use CHG as you would any other liquid soap.  You can apply chg directly  to the skin and wash  Gently with a scrungie or clean washcloth.  5.  Apply the CHG Soap to your body ONLY FROM THE NECK DOWN.   Do not use on face/ open                           Wound or open sores. Avoid contact with eyes, ears mouth and genitals (private parts).                       Wash face,  Genitals (private parts) with your normal soap.             6.  Wash thoroughly, paying special attention to the area where your surgery  will be performed.  7.  Thoroughly rinse your body with warm water from the neck down.  8.  DO NOT shower/wash with your normal soap after using and rinsing off  the CHG Soap.                9.  Pat yourself dry with a clean towel.            10.  Wear clean pajamas.            11.  Place clean sheets on your bed the night of your first shower and do not  sleep with pets. Day of Surgery : Do not apply any lotions/deodorants the morning of surgery.  Please wear clean clothes to the hospital/surgery center.  FAILURE TO FOLLOW THESE INSTRUCTIONS MAY RESULT IN THE CANCELLATION OF YOUR SURGERY PATIENT SIGNATURE_________________________________  NURSE SIGNATURE__________________________________  ________________________________________________________________________   [name]  An incentive spirometer is a tool that can help keep your lungs clear and  active. This tool measures how well you are filling your lungs with each breath. Taking long deep breaths may help reverse or decrease the chance of developing breathing (pulmonary) problems (especially infection) following:  A long period of time when you are unable to move or be active. BEFORE THE PROCEDURE   If the spirometer includes an indicator to show your best effort, your nurse or respiratory therapist will set it to a desired goal.  If possible, sit up straight or lean slightly forward. Try not to slouch.  Hold the incentive spirometer in an upright position. INSTRUCTIONS FOR USE   Sit on the edge of your bed if possible, or sit up as far as you can in bed or on a chair.  Hold the incentive spirometer in an upright position.  Breathe out normally.  Place the mouthpiece in your mouth and seal your lips tightly around it.  Breathe in slowly and as deeply as possible, raising the piston or the ball toward the top of the column.  Hold your breath for 3-5 seconds or for as long as possible. Allow the piston or ball to fall to the bottom of the column.  Remove the mouthpiece from your mouth and breathe out normally.  Rest for a few seconds and repeat Steps 1 through 7 at least 10 times every 1-2 hours when you are awake. Take your time and take a few normal breaths between deep breaths.  The spirometer may include an indicator to show your best effort. Use the indicator as a goal to work toward during each repetition.  After each set of 10 deep breaths, practice coughing to be sure your lungs are clear. If you have an incision (the cut made at the time of surgery),  support your incision when coughing by placing a pillow or rolled up towels firmly against it. Once you are able to get out of bed, walk around indoors and cough well. You may stop using the incentive spirometer when instructed by your caregiver.  RISKS AND COMPLICATIONS  Take your time so you do not get dizzy or  light-headed.  If you are in pain, you may need to take or ask for pain medication before doing incentive spirometry. It is harder to take a deep breath if you are having pain. AFTER USE  Rest and breathe slowly and easily.  It can be helpful to keep track of a log of your progress. Your caregiver can provide you with a simple table to help with this. If you are using the spirometer at home, follow these instructions: [place] IF:   You are having difficultly using the spirometer.  You have trouble using the spirometer as often as instructed.  Your pain medication is not giving enough relief while using the spirometer.  You develop fever of 100.5 F (38.1 C) or higher. SEEK IMMEDIATE MEDICAL CARE IF:   You cough up bloody sputum that had not been present before.  You develop fever of 102 F (38.9 C) or greater.  You develop worsening pain at or near the incision site. MAKE SURE YOU:   Understand these instructions.  Will watch your condition.  Will get help right away if you are not doing well or get worse. Document Released: 06/27/2006 Document Revised: 05/09/2011 Document Reviewed: 08/28/2006 ExitCare Patient Information 2014 Exit[employer], [place].   ________________________________________________________________________  WHAT IS A BLOOD TRANSFUSION? Blood Transfusion Information  A transfusion is the replacement of blood or some of its parts. Blood is made up of multiple cells which provide different functions.  Red blood cells carry oxygen and are used for blood loss replacement.  White blood cells fight against infection.  Platelets control bleeding.  Plasma helps clot blood.  Other blood products are available for specialized needs, such as hemophilia or other clotting disorders. BEFORE THE TRANSFUSION  Who gives blood for transfusions?   Healthy volunteers who are fully evaluated to make sure their blood is safe. This is blood bank  blood. Transfusion therapy is the safest it has ever been in the practice of medicine. Before blood is taken from a donor, a complete history is taken to make sure that person has no history of diseases nor engages in risky social behavior (examples are intravenous drug use or sexual activity with multiple partners). The donor's travel history is screened to minimize risk of transmitting infections, such as malaria. The donated blood is tested for signs of infectious diseases, such as HIV and hepatitis. The blood is then tested to be sure it is compatible with you in order to minimize the chance of a transfusion reaction. If you or a relative donates blood, this is often done in anticipation of surgery and is not appropriate for emergency situations. It takes many days to process the donated blood. RISKS AND COMPLICATIONS Although transfusion therapy is very safe and saves many lives, the main dangers of transfusion include:   Getting an infectious disease.  Developing a transfusion reaction. This is an allergic reaction to something in the blood you were given. Every precaution is taken to prevent this. The decision to have a blood transfusion has been considered carefully by your caregiver before blood is given. Blood is not given unless the benefits outweigh the risks. AFTER THE TRANSFUSION  Right after receiving a blood transfusion, you will usually feel much better and more energetic. This is especially true if your red blood cells have gotten low (anemic). The transfusion raises the level of the red blood cells which carry oxygen, and this usually causes an energy increase.  The nurse administering the transfusion will monitor you carefully for complications. HOME CARE INSTRUCTIONS  No special instructions are needed after a transfusion. You may find your energy is better. Speak with your caregiver about any limitations on activity for underlying diseases you may have. SEEK MEDICAL CARE IF:    Your condition is not improving after your transfusion.  You develop redness or irritation at the intravenous (IV) site. SEEK IMMEDIATE MEDICAL CARE IF:  Any of the following symptoms occur over the next 12 hours:  Shaking chills.  You have a temperature by mouth above 102 F (38.9 C), not controlled by medicine.  Chest, back, or muscle pain.  People around you feel you are not acting correctly or are confused.  Shortness of breath or difficulty breathing.  Dizziness and fainting.  You get a rash or develop hives.  You have a decrease in urine output.  Your urine turns a dark color or changes to pink, red, or brown. Any of the following symptoms occur over the next 10 days:  You have a temperature by mouth above 102 F (38.9 C), not controlled by medicine.  Shortness of breath.  Weakness after normal activity.  The white part of the eye turns yellow (jaundice).  You have a decrease in the amount of urine or are urinating less often.  Your urine turns a dark color or changes to pink, red, or brown. Document Released: 02/12/2000 Document Revised: 05/09/2011 Document Reviewed: 10/01/2007 [facility] Patient Information 2014 [place], [place].  _______________________________________________________________________

## 2015-08-10 ENCOUNTER — Encounter (HOSPITAL_COMMUNITY)
Admission: RE | Admit: 2015-08-10 | Discharge: 2015-08-10 | Disposition: A | Discharge status: Home / Self Care | Payer: Medicare Other | Source: Ambulatory Visit | Attending: Orthopedic Surgery | Admitting: Orthopedic Surgery

## 2015-08-10 ENCOUNTER — Encounter (HOSPITAL_COMMUNITY): Payer: Self-pay

## 2015-08-10 DIAGNOSIS — Z01812 Encounter for preprocedural laboratory examination: Secondary | ICD-10-CM | POA: Diagnosis not present

## 2015-08-10 DIAGNOSIS — M1711 Unilateral primary osteoarthritis, right knee: Secondary | ICD-10-CM | POA: Insufficient documentation

## 2015-08-10 HISTORY — DX: Malignant (primary) neoplasm, unspecified: C80.1

## 2015-08-10 LAB — COMPREHENSIVE METABOLIC PANEL
ALK PHOS: 73 U/L (ref 38–126)
ALT: 25 U/L (ref 14–54)
ANION GAP: 5 (ref 5–15)
AST: 29 U/L (ref 15–41)
Albumin: 4.6 g/dL (ref 3.5–5.0)
BUN: 20 mg/dL (ref 6–20)
CALCIUM: 10 mg/dL (ref 8.9–10.3)
CO2: 28 mmol/L (ref 22–32)
Chloride: 107 mmol/L (ref 101–111)
Creatinine, Ser: 0.83 mg/dL (ref 0.44–1.00)
GFR calc non Af Amer: >60 mL/min (ref >60)
Glucose, Bld: 120 mg/dL — ABNORMAL HIGH (ref 65–99)
Potassium: 4.8 mmol/L (ref 3.5–5.1)
SODIUM: 140 mmol/L (ref 135–145)
TOTAL PROTEIN: 7.2 g/dL (ref 6.5–8.1)
Total Bilirubin: 0.6 mg/dL (ref 0.3–1.2)

## 2015-08-10 LAB — ABO/RH: ABO/RH(D): A POS

## 2015-08-10 LAB — CBC
HCT: 39.2 % (ref 36.0–46.0)
HEMOGLOBIN: 13.7 g/dL (ref 12.0–15.0)
MCH: 31.4 pg (ref 26.0–34.0)
MCHC: 34.9 g/dL (ref 30.0–36.0)
MCV: 89.9 fL (ref 78.0–100.0)
Platelets: 230 10*3/uL (ref 150–400)
RBC: 4.36 MIL/uL (ref 3.87–5.11)
RDW: 12.8 % (ref 11.5–15.5)
WBC: 5.6 10*3/uL (ref 4.0–10.5)

## 2015-08-10 LAB — URINALYSIS, ROUTINE W REFLEX MICROSCOPIC
Bilirubin Urine: NEGATIVE
GLUCOSE, UA: NEGATIVE mg/dL
HGB URINE DIPSTICK: NEGATIVE
Ketones, ur: NEGATIVE mg/dL
Leukocytes, UA: NEGATIVE
Nitrite: NEGATIVE
Protein, ur: NEGATIVE mg/dL
SPECIFIC GRAVITY, URINE: 1.025 (ref 1.005–1.030)
pH: 6 (ref 5.0–8.0)

## 2015-08-10 LAB — SURGICAL PCR SCREEN
MRSA, PCR: NEGATIVE
STAPHYLOCOCCUS AUREUS: NEGATIVE

## 2015-08-10 LAB — PROTIME-INR
INR: 0.99 (ref 0.00–1.49)
Prothrombin Time: 13.3 seconds (ref 11.6–15.2)

## 2015-08-10 LAB — APTT: aPTT: 25 seconds (ref 24–37)

## 2015-08-10 NOTE — Progress Notes (Signed)
07-31-15 LOV - Dr. Wynelle Link - in chart

## 2015-08-16 ENCOUNTER — Ambulatory Visit: Payer: Self-pay | Admitting: Orthopedic Surgery

## 2015-08-16 NOTE — H&P (Signed)
Tracy [name] DOB: 09/18/1950 Married / Language: English / Race: White Female Date of Admission:  08/17/2015 CC:  Right Knee Pain History of Present Illness The patient is a 65 year old female who comes in for a preoperative History and Physical. The patient is scheduled for a right total knee arthroplasty to be performed by Dr. Dione Plover. Aluisio, MD at [facility] on 08-17-2015. The patient is a 65 year old female who presented for follow up of their knee. The patient is being followed for their right knee pain and osteoarthritis. They are months out from Synvisc series (that did not provide any relief of symptoms). Symptoms reported include: pain (and aching), swelling and instability. The following medication has been used for pain control: antiinflammatory medication (Ibuprofen). States due to favoring going up and down steps she has had the onset of pain in the right shoulder over the past 2 months Unfortunately the viscosupplement injections did not help at all. She has documented advanced arthritis in the right knee, bone on bone changes. She has had cortisone and viscosupplement injections without any benefit. Knee is hurting at all times. It is limiting what she can and cannot do. She is also developing some right shoulder pain from having to use her arms to push up, to get her walking. She has got advanced arthritic change with no response to cortisone and viscosupplements. She has tried some home exercise without benefit. At this point, the most predictive means of improving her pain and function is going to be total knee arthroplasty. Discussed in detail today and she would like to proceed. At this point, the most predictable means of improving pain and function is total knee arthroplasty. The procedure, risks, potential complications and rehab course are discussed in detail and the patient elects to proceed. The goals of this procedure are decreased pain and increased function.  There is a high liklihood that both of these goals will be achieved. They have been treated conservatively in the past for the above stated problem and despite conservative measures, they continue to have progressive pain and severe functional limitations and dysfunction. They have failed non-operative management including home exercise, medications, and injections. It is felt that they would benefit from undergoing total joint replacement. Risks and benefits of the procedure have been discussed with the patient and they elect to proceed with surgery. There are no active contraindications to surgery such as ongoing infection or rapidly progressive neurological disease.  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 Mother  Deceased.  Social History Tobacco use  never smoker Alcohol use  current drinker; drinks wine and hard liquor; only occasionally per week Children  2 Current work status  retired Engineer, agricultural (Currently)  no Drug/Alcohol Rehab (Previously)  no Exercise  Exercises daily; does running / walking Illicit drug use  no Living situation  live with spouse Marital status  married Number of flights of stairs before winded  greater than 5 Pain Contract  no Advance Directives  Living  Will Post-Surgical Plans  Home with husband John following TKA on 08/17/2015  Medication History Omeprazole (40MG  Capsule DR, Oral) Active. Fenofibrate (160MG   Tablet, Oral) Active. Calcium (Oral) Specific strength unknown - Active. Womens One Daily (Oral) Active. Vitamin B Complex (Oral) Active. D3 Super Strength (2000UNIT Capsule, Oral) Active. Cranberry Extract (Oral) Specific strength unknown - Active. Psyllium (58.6% Powder, Oral) Active. Fluticasone Propionate (Nasal) (50MCG/ACT Suspension, Nasal) Active. Premarin (0.625MG /GM Cream, Vaginal) Active. Doxycycline Hyclate (100MG  Tablet, Oral) Active. (For Rosacea Outbreak) Melatonin (5MG  Tablet, Oral) Active.   Past Surgical History Left Arm Fracture Repair  Date: 1955. Appendectomy  Date: 1965. Tonsils  Date: 1966. Tubal Ligation  Date: 05/1978. Right Foot Bunion  Date: 09/1989. Cholecystectomy  Date: 07/2001. Redo Right Foot Bunion Surgery  Date: 03/2006. Arthroscopic Knee Surgery - Right  Date: 08/2008. Low Back Disc Surgery  Date: 12/2008. Left L4-5 Microdisc Surgery Left Hip Bursectomy with Gluteal Tendon Repair  Date: 03/2014.   Review of Systems General Present- Chills, Fatigue and Night Sweats. Not Present- Fever, Memory Loss, Weight Gain and Weight Loss. Skin Not Present- Eczema, Hives, Itching, Lesions and Rash. HEENT Not Present- Dentures, Double Vision, Headache, Hearing Loss, Tinnitus and Visual Loss. Respiratory Not Present- Allergies, Chronic Cough, Coughing up blood, Shortness of breath at rest and Shortness of breath with exertion. Cardiovascular Not Present- Chest Pain, Difficulty Breathing Lying Down, Murmur, Palpitations, Racing/skipping heartbeats and Swelling. Gastrointestinal Present- Heartburn. Not Present- Abdominal Pain, Bloody Stool, Constipation, Diarrhea, Difficulty Swallowing, Jaundice, Loss of appetitie, Nausea and Vomiting. Female Genitourinary Present- Urinary frequency and Urinating at Night. Not Present- Blood in Urine, Discharge, Flank Pain, Incontinence, Painful Urination, Urgency, Urinary Retention and Weak urinary  stream. Musculoskeletal Present- Back Pain, Joint Pain, Joint Swelling, Morning Stiffness, Muscle Pain, Muscle Weakness and Spasms. Neurological Not Present- Blackout spells, Difficulty with balance, Dizziness, Paralysis, Tremor and Weakness. Psychiatric Present- Insomnia.  Vitals  Weight: 175 lb Height: 64in Weight was reported by patient. Height was reported by patient. Body Surface Area: 1.85 m Body Mass Index: 30.04 kg/m  Pulse: 72 (Regular)  BP: 132/78 (Sitting, Right Arm, Standard)  Physical Exam  General Mental Status -Alert, cooperative and good historian. General Appearance-pleasant, Not in acute distress. Orientation-Oriented X3. Build & Nutrition-Well nourished and Well developed.  Head and Neck Head-normocephalic, atraumatic . Neck Global Assessment - supple, no bruit auscultated on the right, no bruit auscultated on the left.  Eye Pupil - Bilateral-Regular and Round. Motion - Bilateral-EOMI.  Chest and Lung Exam Auscultation Breath sounds - clear at anterior chest wall and clear at posterior chest wall. Adventitious sounds - No Adventitious sounds.  Cardiovascular Auscultation Rhythm - Regular rate and rhythm. Heart Sounds - S1 WNL and S2 WNL. Murmurs & Other Heart Sounds - Auscultation of the heart reveals - No Murmurs.  Abdomen Palpation/Percussion Tenderness - Abdomen is non-tender to palpation. Rigidity (guarding) - Abdomen is soft. Auscultation Auscultation of the abdomen reveals - Bowel sounds normal.  Female Genitourinary Note: Not done, not pertinent to present illness   Musculoskeletal Note: She is alert and oriented, in no apparent distress. Evaluation of her right knee shows no effusion. Range about 5 to 125 with marked crepitus on range of motion, tenderness medial and lateral with no instability noted. Evaluation of her right shoulder shows normal range of motion. She has positive impingement to forward elevation and  crossed adduction. Her strength is intact.  Assessment & Plan Primary osteoarthritis of right knee (M17.11)  Note:Surgical Plans: Left Right Total Knee Hip Replacement -  Anterior Approach  Disposition: Home  PCP: Dr. Birdie Riddle  IV TXA  Anesthesia Issues: Nusea and vomiting in the past with Demerol in the postop period.  VERITAS STUDY PATIENT Traditional PT Postop   Signed electronically by Ok Edwards, III PA-C

## 2015-08-16 NOTE — Anesthesia Preprocedure Evaluation (Addendum)
Anesthesia Evaluation  Patient identified by MRN, date of birth, ID band Patient awake    Reviewed: Allergy & Precautions, NPO status , Patient's Chart, lab work & pertinent test results  History of Anesthesia Complications (+) PONV  Airway Mallampati: II  TM Distance: >3 FB Neck ROM: Full    Dental no notable dental hx.    Pulmonary neg pulmonary ROS,    Pulmonary exam normal breath sounds clear to auscultation       Cardiovascular negative cardio ROS Normal cardiovascular exam Rhythm:Regular Rate:Normal     Neuro/Psych negative neurological ROS  negative psych ROS   GI/Hepatic Neg liver ROS, GERD  Medicated and Controlled,  Endo/Other  Hypothyroidism   Renal/GU negative Renal ROS  negative genitourinary   Musculoskeletal  (+) Arthritis ,   Abdominal   Peds negative pediatric ROS (+)  Hematology negative hematology ROS (+)   Anesthesia Other Findings   Reproductive/Obstetrics negative OB ROS                           Anesthesia Physical Anesthesia Plan  ASA: II  Anesthesia Plan: General   Post-op Pain Management:    Induction: Intravenous  Airway Management Planned: Oral ETT  Additional Equipment:   Intra-op Plan:   Post-operative Plan: Extubation in OR  Informed Consent: I have reviewed the patients History and Physical, chart, labs and discussed the procedure including the risks, benefits and alternatives for the proposed anesthesia with the patient or authorized representative who has indicated his/her understanding and acceptance.     Plan Discussed with: Surgeon and CRNA  Anesthesia Plan Comments: (H/o back surgery. Previous GA with hip surgery without complications. Pt desires GA today again)      Anesthesia Quick Evaluation

## 2015-08-17 ENCOUNTER — Inpatient Hospital Stay (HOSPITAL_COMMUNITY): Payer: Medicare Other | Admitting: Anesthesiology

## 2015-08-17 ENCOUNTER — Inpatient Hospital Stay (HOSPITAL_COMMUNITY)
Admission: RE | Admit: 2015-08-17 | Discharge: 2015-08-19 | DRG: 470 | Disposition: A | Discharge status: Home Health | Payer: Medicare Other | Source: Ambulatory Visit | Attending: Orthopedic Surgery | Admitting: Orthopedic Surgery

## 2015-08-17 ENCOUNTER — Encounter (HOSPITAL_COMMUNITY)
Admission: RE | Disposition: A | Discharge status: Home Health | Payer: Self-pay | Source: Ambulatory Visit | Attending: Orthopedic Surgery

## 2015-08-17 ENCOUNTER — Encounter (HOSPITAL_COMMUNITY): Payer: Self-pay | Admitting: *Deleted

## 2015-08-17 DIAGNOSIS — M25761 Osteophyte, right knee: Secondary | ICD-10-CM | POA: Diagnosis present

## 2015-08-17 DIAGNOSIS — M8580 Other specified disorders of bone density and structure, unspecified site: Secondary | ICD-10-CM | POA: Diagnosis not present

## 2015-08-17 DIAGNOSIS — K219 Gastro-esophageal reflux disease without esophagitis: Secondary | ICD-10-CM | POA: Diagnosis present

## 2015-08-17 DIAGNOSIS — M179 Osteoarthritis of knee, unspecified: Secondary | ICD-10-CM | POA: Diagnosis present

## 2015-08-17 DIAGNOSIS — E0590 Thyrotoxicosis, unspecified without thyrotoxic crisis or storm: Secondary | ICD-10-CM | POA: Diagnosis not present

## 2015-08-17 DIAGNOSIS — M25511 Pain in right shoulder: Secondary | ICD-10-CM | POA: Diagnosis not present

## 2015-08-17 DIAGNOSIS — M1711 Unilateral primary osteoarthritis, right knee: Secondary | ICD-10-CM | POA: Diagnosis not present

## 2015-08-17 DIAGNOSIS — M1710 Unilateral primary osteoarthritis, unspecified knee: Secondary | ICD-10-CM | POA: Diagnosis present

## 2015-08-17 HISTORY — PX: TOTAL KNEE ARTHROPLASTY: SHX125

## 2015-08-17 LAB — TYPE AND SCREEN
ABO/RH(D): A POS
Antibody Screen: NEGATIVE

## 2015-08-17 SURGERY — ARTHROPLASTY, KNEE, TOTAL
Anesthesia: General | Site: Knee | Laterality: Right

## 2015-08-17 MED ORDER — FENTANYL CITRATE (PF) 100 MCG/2ML IJ SOLN
INTRAMUSCULAR | Status: DC | PRN
  Administered 2015-08-17: 100 ug via INTRAVENOUS

## 2015-08-17 MED ORDER — SODIUM CHLORIDE 0.9 % IJ SOLN
INTRAMUSCULAR | Status: DC | PRN
  Administered 2015-08-17: 30 mL

## 2015-08-17 MED ORDER — CHLORHEXIDINE GLUCONATE 4 % EX LIQD: 60.0000 mL | Freq: Once | CUTANEOUS | Status: DC

## 2015-08-17 MED ORDER — LIDOCAINE HCL (CARDIAC) 20 MG/ML IV SOLN
INTRAVENOUS | Status: DC | PRN
  Administered 2015-08-17: 50 mg via INTRAVENOUS

## 2015-08-17 MED ORDER — TRAMADOL HCL 50 MG PO TABS
50.0000 mg | ORAL_TABLET | Freq: Four times a day (QID) | ORAL | Status: DC | PRN
  Administered 2015-08-18: 50 mg via ORAL
  Administered 2015-08-18 – 2015-08-19 (×2): 100 mg via ORAL
  Filled 2015-08-17 (×2): qty 2
  Filled 2015-08-17: qty 1

## 2015-08-17 MED ORDER — FLUTICASONE PROPIONATE 50 MCG/ACT NA SUSP
2.0000 | Freq: Every day | NASAL | Status: DC
  Administered 2015-08-17 – 2015-08-18 (×2): 2 via NASAL
  Filled 2015-08-17: qty 16

## 2015-08-17 MED ORDER — LACTATED RINGERS IV SOLN
INTRAVENOUS | Status: DC | PRN
  Administered 2015-08-17 (×2): via INTRAVENOUS

## 2015-08-17 MED ORDER — ACETAMINOPHEN 10 MG/ML IV SOLN
1000.0000 mg | Freq: Once | INTRAVENOUS | Status: AC
  Administered 2015-08-17: 1000 mg via INTRAVENOUS
  Filled 2015-08-17: qty 100

## 2015-08-17 MED ORDER — HYDROMORPHONE HCL 1 MG/ML IJ SOLN
INTRAMUSCULAR | Status: DC | PRN
  Administered 2015-08-17 (×2): 1 mg via INTRAVENOUS

## 2015-08-17 MED ORDER — ACETAMINOPHEN 500 MG PO TABS
1000.0000 mg | ORAL_TABLET | Freq: Four times a day (QID) | ORAL | Status: AC
  Administered 2015-08-17 – 2015-08-18 (×3): 1000 mg via ORAL
  Filled 2015-08-17 (×3): qty 2

## 2015-08-17 MED ORDER — PHENYLEPHRINE 40 MCG/ML (10ML) SYRINGE FOR IV PUSH (FOR BLOOD PRESSURE SUPPORT)
PREFILLED_SYRINGE | INTRAVENOUS | Status: AC
  Filled 2015-08-17: qty 10

## 2015-08-17 MED ORDER — PROPOFOL 10 MG/ML IV BOLUS
INTRAVENOUS | Status: DC | PRN
  Administered 2015-08-17: 200 mg via INTRAVENOUS

## 2015-08-17 MED ORDER — SODIUM CHLORIDE 0.9 % IR SOLN
Status: DC | PRN
  Administered 2015-08-17: 1000 mL

## 2015-08-17 MED ORDER — DEXAMETHASONE SODIUM PHOSPHATE 10 MG/ML IJ SOLN
10.0000 mg | Freq: Once | INTRAMUSCULAR | Status: AC
  Administered 2015-08-17: 10 mg via INTRAVENOUS

## 2015-08-17 MED ORDER — ACETAMINOPHEN 650 MG RE SUPP: 650.0000 mg | Freq: Four times a day (QID) | RECTAL | Status: DC | PRN

## 2015-08-17 MED ORDER — TRANEXAMIC ACID 1000 MG/10ML IV SOLN
1000.0000 mg | INTRAVENOUS | Status: DC
  Filled 2015-08-17: qty 10

## 2015-08-17 MED ORDER — PROPOFOL 10 MG/ML IV BOLUS
INTRAVENOUS | Status: AC
  Filled 2015-08-17: qty 60

## 2015-08-17 MED ORDER — PHENYLEPHRINE 40 MCG/ML (10ML) SYRINGE FOR IV PUSH (FOR BLOOD PRESSURE SUPPORT)
PREFILLED_SYRINGE | INTRAVENOUS | Status: DC | PRN
  Administered 2015-08-17 (×5): 80 ug via INTRAVENOUS

## 2015-08-17 MED ORDER — SUCCINYLCHOLINE CHLORIDE 200 MG/10ML IV SOSY
PREFILLED_SYRINGE | INTRAVENOUS | Status: DC | PRN
  Administered 2015-08-17: 100 mg via INTRAVENOUS

## 2015-08-17 MED ORDER — FENOFIBRATE 160 MG PO TABS
160.0000 mg | ORAL_TABLET | Freq: Every day | ORAL | Status: DC
  Administered 2015-08-18 – 2015-08-19 (×2): 160 mg via ORAL
  Filled 2015-08-17 (×2): qty 1

## 2015-08-17 MED ORDER — ACETAMINOPHEN 325 MG PO TABS
650.0000 mg | ORAL_TABLET | Freq: Four times a day (QID) | ORAL | Status: DC | PRN
  Administered 2015-08-19 (×2): 650 mg via ORAL
  Filled 2015-08-17 (×2): qty 2

## 2015-08-17 MED ORDER — METOCLOPRAMIDE HCL 5 MG/ML IJ SOLN: 5.0000 mg | Freq: Three times a day (TID) | INTRAMUSCULAR | Status: DC | PRN

## 2015-08-17 MED ORDER — FENTANYL CITRATE (PF) 100 MCG/2ML IJ SOLN
INTRAMUSCULAR | Status: AC
  Filled 2015-08-17: qty 2

## 2015-08-17 MED ORDER — BUPIVACAINE HCL (PF) 0.25 % IJ SOLN
INTRAMUSCULAR | Status: AC
  Filled 2015-08-17: qty 30

## 2015-08-17 MED ORDER — CEFAZOLIN SODIUM-DEXTROSE 2-4 GM/100ML-% IV SOLN
INTRAVENOUS | Status: AC
  Filled 2015-08-17: qty 100

## 2015-08-17 MED ORDER — DIPHENHYDRAMINE HCL 25 MG PO TABS
25.0000 mg | ORAL_TABLET | Freq: Every day | ORAL | Status: DC
  Filled 2015-08-17: qty 1

## 2015-08-17 MED ORDER — CEFAZOLIN SODIUM-DEXTROSE 2-4 GM/100ML-% IV SOLN
2.0000 g | Freq: Four times a day (QID) | INTRAVENOUS | Status: AC
  Administered 2015-08-17 (×2): 2 g via INTRAVENOUS
  Filled 2015-08-17 (×2): qty 100

## 2015-08-17 MED ORDER — SUGAMMADEX SODIUM 200 MG/2ML IV SOLN
INTRAVENOUS | Status: DC | PRN
  Administered 2015-08-17: 200 mg via INTRAVENOUS

## 2015-08-17 MED ORDER — DEXAMETHASONE SODIUM PHOSPHATE 10 MG/ML IJ SOLN
10.0000 mg | Freq: Once | INTRAMUSCULAR | Status: AC
  Administered 2015-08-18: 10 mg via INTRAVENOUS
  Filled 2015-08-17: qty 1

## 2015-08-17 MED ORDER — DEXAMETHASONE SODIUM PHOSPHATE 10 MG/ML IJ SOLN
INTRAMUSCULAR | Status: AC
  Filled 2015-08-17: qty 1

## 2015-08-17 MED ORDER — FLEET ENEMA 7-19 GM/118ML RE ENEM: 1.0000 | ENEMA | Freq: Once | RECTAL | Status: DC | PRN

## 2015-08-17 MED ORDER — BUPIVACAINE LIPOSOME 1.3 % IJ SUSP
20.0000 mL | Freq: Once | INTRAMUSCULAR | Status: DC
  Filled 2015-08-17: qty 20

## 2015-08-17 MED ORDER — MORPHINE SULFATE (PF) 2 MG/ML IV SOLN
1.0000 mg | INTRAVENOUS | Status: DC | PRN
  Administered 2015-08-17 – 2015-08-19 (×6): 1 mg via INTRAVENOUS
  Filled 2015-08-17 (×6): qty 1

## 2015-08-17 MED ORDER — RIVAROXABAN 10 MG PO TABS
10.0000 mg | ORAL_TABLET | Freq: Every day | ORAL | Status: DC
  Administered 2015-08-18 – 2015-08-19 (×2): 10 mg via ORAL
  Filled 2015-08-17 (×2): qty 1

## 2015-08-17 MED ORDER — POLYETHYLENE GLYCOL 3350 17 G PO PACK: 17.0000 g | PACK | Freq: Every day | ORAL | Status: DC | PRN

## 2015-08-17 MED ORDER — RIVAROXABAN 10 MG PO TABS: 10.0000 mg | ORAL_TABLET | Freq: Every day | ORAL | Status: DC

## 2015-08-17 MED ORDER — LIDOCAINE HCL (CARDIAC) 20 MG/ML IV SOLN
INTRAVENOUS | Status: AC
  Filled 2015-08-17: qty 5

## 2015-08-17 MED ORDER — HYDROMORPHONE HCL 2 MG/ML IJ SOLN
INTRAMUSCULAR | Status: AC
  Filled 2015-08-17: qty 1

## 2015-08-17 MED ORDER — METOCLOPRAMIDE HCL 5 MG PO TABS: 5.0000 mg | ORAL_TABLET | Freq: Three times a day (TID) | ORAL | Status: DC | PRN

## 2015-08-17 MED ORDER — CEFAZOLIN SODIUM-DEXTROSE 2-4 GM/100ML-% IV SOLN
2.0000 g | INTRAVENOUS | Status: AC
  Administered 2015-08-17: 2 g via INTRAVENOUS
  Filled 2015-08-17: qty 100

## 2015-08-17 MED ORDER — METHOCARBAMOL 500 MG PO TABS
500.0000 mg | ORAL_TABLET | Freq: Four times a day (QID) | ORAL | Status: DC | PRN
  Administered 2015-08-18 – 2015-08-19 (×6): 500 mg via ORAL
  Filled 2015-08-17 (×6): qty 1

## 2015-08-17 MED ORDER — METHOCARBAMOL 1000 MG/10ML IJ SOLN
500.0000 mg | Freq: Four times a day (QID) | INTRAVENOUS | Status: DC | PRN
  Administered 2015-08-17: 500 mg via INTRAVENOUS
  Filled 2015-08-17: qty 5
  Filled 2015-08-17: qty 550

## 2015-08-17 MED ORDER — MIDAZOLAM HCL 2 MG/2ML IJ SOLN
INTRAMUSCULAR | Status: AC
  Filled 2015-08-17: qty 2

## 2015-08-17 MED ORDER — ONDANSETRON HCL 4 MG/2ML IJ SOLN
INTRAMUSCULAR | Status: DC | PRN
  Administered 2015-08-17: 4 mg via INTRAVENOUS

## 2015-08-17 MED ORDER — SODIUM CHLORIDE 0.9 % IV SOLN
1000.0000 mg | Freq: Once | INTRAVENOUS | Status: AC
  Administered 2015-08-17: 1000 mg via INTRAVENOUS
  Filled 2015-08-17: qty 10

## 2015-08-17 MED ORDER — METHOCARBAMOL 500 MG PO TABS: 500.0000 mg | ORAL_TABLET | Freq: Four times a day (QID) | ORAL | Status: DC | PRN

## 2015-08-17 MED ORDER — OXYCODONE HCL 5 MG PO TABS: 5.0000 mg | ORAL_TABLET | ORAL | Status: DC | PRN

## 2015-08-17 MED ORDER — ACETAMINOPHEN 10 MG/ML IV SOLN
INTRAVENOUS | Status: AC
  Filled 2015-08-17: qty 100

## 2015-08-17 MED ORDER — TRAMADOL HCL 50 MG PO TABS: 50.0000 mg | ORAL_TABLET | Freq: Four times a day (QID) | ORAL | Status: DC | PRN

## 2015-08-17 MED ORDER — OXYCODONE HCL 5 MG PO TABS
5.0000 mg | ORAL_TABLET | ORAL | Status: DC | PRN
  Administered 2015-08-17 – 2015-08-19 (×10): 10 mg via ORAL
  Filled 2015-08-17 (×10): qty 2

## 2015-08-17 MED ORDER — MIDAZOLAM HCL 5 MG/5ML IJ SOLN
INTRAMUSCULAR | Status: DC | PRN
  Administered 2015-08-17: 2 mg via INTRAVENOUS

## 2015-08-17 MED ORDER — MENTHOL 3 MG MT LOZG: 1.0000 | LOZENGE | OROMUCOSAL | Status: DC | PRN

## 2015-08-17 MED ORDER — PANTOPRAZOLE SODIUM 40 MG PO TBEC
80.0000 mg | DELAYED_RELEASE_TABLET | Freq: Every day | ORAL | Status: DC
  Administered 2015-08-18 – 2015-08-19 (×2): 80 mg via ORAL
  Filled 2015-08-17 (×2): qty 2

## 2015-08-17 MED ORDER — SODIUM CHLORIDE 0.9 % IJ SOLN
INTRAMUSCULAR | Status: AC
  Filled 2015-08-17: qty 50

## 2015-08-17 MED ORDER — SUGAMMADEX SODIUM 200 MG/2ML IV SOLN
INTRAVENOUS | Status: AC
Start: 2015-08-17 — End: 2015-08-17
  Filled 2015-08-17: qty 2

## 2015-08-17 MED ORDER — BUPIVACAINE LIPOSOME 1.3 % IJ SUSP
INTRAMUSCULAR | Status: DC | PRN
  Administered 2015-08-17: 20 mL

## 2015-08-17 MED ORDER — PHENOL 1.4 % MT LIQD
1.0000 | OROMUCOSAL | Status: DC | PRN
  Filled 2015-08-17: qty 177

## 2015-08-17 MED ORDER — BISACODYL 10 MG RE SUPP: 10.0000 mg | Freq: Every day | RECTAL | Status: DC | PRN

## 2015-08-17 MED ORDER — ONDANSETRON HCL 4 MG/2ML IJ SOLN: 4.0000 mg | Freq: Four times a day (QID) | INTRAMUSCULAR | Status: DC | PRN

## 2015-08-17 MED ORDER — DIPHENHYDRAMINE HCL 12.5 MG/5ML PO ELIX
12.5000 mg | ORAL_SOLUTION | ORAL | Status: DC | PRN
  Filled 2015-08-17: qty 10

## 2015-08-17 MED ORDER — DIPHENHYDRAMINE HCL 25 MG PO CAPS
25.0000 mg | ORAL_CAPSULE | Freq: Every day | ORAL | Status: DC
Start: 2015-08-17 — End: 2015-08-19
  Administered 2015-08-18: 25 mg via ORAL
  Filled 2015-08-17: qty 1

## 2015-08-17 MED ORDER — METOCLOPRAMIDE HCL 5 MG/ML IJ SOLN
INTRAMUSCULAR | Status: DC | PRN
  Administered 2015-08-17: 10 mg via INTRAVENOUS

## 2015-08-17 MED ORDER — SODIUM CHLORIDE 0.9 % IV SOLN
INTRAVENOUS | Status: DC
  Administered 2015-08-17: 11:00:00 via INTRAVENOUS
  Administered 2015-08-18: 1000 mL via INTRAVENOUS

## 2015-08-17 MED ORDER — FENTANYL CITRATE (PF) 100 MCG/2ML IJ SOLN
25.0000 ug | INTRAMUSCULAR | Status: DC | PRN
  Administered 2015-08-17: 50 ug via INTRAVENOUS
  Administered 2015-08-17: 25 ug via INTRAVENOUS

## 2015-08-17 MED ORDER — ROCURONIUM BROMIDE 100 MG/10ML IV SOLN
INTRAVENOUS | Status: DC | PRN
  Administered 2015-08-17: 35 mg via INTRAVENOUS

## 2015-08-17 MED ORDER — ONDANSETRON HCL 4 MG PO TABS: 4.0000 mg | ORAL_TABLET | Freq: Four times a day (QID) | ORAL | Status: DC | PRN

## 2015-08-17 MED ORDER — METOCLOPRAMIDE HCL 5 MG/ML IJ SOLN: 10.0000 mg | Freq: Once | INTRAMUSCULAR | Status: DC | PRN

## 2015-08-17 MED ORDER — BUPIVACAINE HCL 0.25 % IJ SOLN
INTRAMUSCULAR | Status: DC | PRN
  Administered 2015-08-17: 20 mL

## 2015-08-17 MED ORDER — ONDANSETRON HCL 4 MG/2ML IJ SOLN
INTRAMUSCULAR | Status: AC
  Filled 2015-08-17: qty 2

## 2015-08-17 MED ORDER — DOCUSATE SODIUM 100 MG PO CAPS
100.0000 mg | ORAL_CAPSULE | Freq: Two times a day (BID) | ORAL | Status: DC
  Administered 2015-08-17 – 2015-08-19 (×5): 100 mg via ORAL
  Filled 2015-08-17 (×5): qty 1

## 2015-08-17 SURGICAL SUPPLY — 54 items
BAG DECANTER FOR FLEXI CONT (MISCELLANEOUS) ×1 IMPLANT
BAG SPEC THK2 15X12 ZIP CLS (MISCELLANEOUS) ×1
BAG ZIPLOCK 12X15 (MISCELLANEOUS) ×2 IMPLANT
BANDAGE ACE 6X5 VEL STRL LF (GAUZE/BANDAGES/DRESSINGS) ×2 IMPLANT
BLADE SAG 18X100X1.27 (BLADE) ×2 IMPLANT
BLADE SAW SGTL 11.0X1.19X90.0M (BLADE) ×2 IMPLANT
BOWL SMART MIX CTS (DISPOSABLE) ×2 IMPLANT
CAPT KNEE TOTAL 3 ATTUNE ×1 IMPLANT
CATH FOLEY LATEX FREE 16FR (CATHETERS) ×1 IMPLANT
CEMENT HV SMART SET (Cement) ×4 IMPLANT
CLOTH BEACON ORANGE TIMEOUT ST (SAFETY) ×2 IMPLANT
CUFF TOURN SGL QUICK 34 (TOURNIQUET CUFF) ×2
CUFF TRNQT CYL 34X4X40X1 (TOURNIQUET CUFF) ×1 IMPLANT
DECANTER SPIKE VIAL GLASS SM (MISCELLANEOUS) ×2 IMPLANT
DRAPE U-SHAPE 47X51 STRL (DRAPES) ×2 IMPLANT
DRSG ADAPTIC 3X8 NADH LF (GAUZE/BANDAGES/DRESSINGS) ×2 IMPLANT
DRSG PAD ABDOMINAL 8X10 ST (GAUZE/BANDAGES/DRESSINGS) ×2 IMPLANT
DURAPREP 26ML APPLICATOR (WOUND CARE) ×2 IMPLANT
ELECT REM PT RETURN 9FT ADLT (ELECTROSURGICAL) ×2
ELECTRODE REM PT RTRN 9FT ADLT (ELECTROSURGICAL) ×1 IMPLANT
EVACUATOR 1/8 PVC DRAIN (DRAIN) ×2 IMPLANT
GAUZE SPONGE 4X4 12PLY STRL (GAUZE/BANDAGES/DRESSINGS) ×2 IMPLANT
GLOVE BIO SURGEON STRL SZ7.5 (GLOVE) IMPLANT
GLOVE BIO SURGEON STRL SZ8 (GLOVE) ×2 IMPLANT
GLOVE BIOGEL PI IND STRL 6.5 (GLOVE) IMPLANT
GLOVE BIOGEL PI IND STRL 8 (GLOVE) ×1 IMPLANT
GLOVE BIOGEL PI INDICATOR 6.5 (GLOVE)
GLOVE BIOGEL PI INDICATOR 8 (GLOVE) ×1
GLOVE SURG SS PI 6.5 STRL IVOR (GLOVE) IMPLANT
GOWN STRL REUS W/TWL LRG LVL3 (GOWN DISPOSABLE) ×2 IMPLANT
GOWN STRL REUS W/TWL XL LVL3 (GOWN DISPOSABLE) IMPLANT
HANDPIECE INTERPULSE COAX TIP (DISPOSABLE) ×2
IMMOBILIZER KNEE 20 (SOFTGOODS) ×3 IMPLANT
IMMOBILIZER KNEE 20 THIGH 36 (SOFTGOODS) ×1 IMPLANT
MANIFOLD NEPTUNE II (INSTRUMENTS) ×2 IMPLANT
NS IRRIG 1000ML POUR BTL (IV SOLUTION) ×2 IMPLANT
PACK TOTAL KNEE CUSTOM (KITS) ×2 IMPLANT
PAD ABD 8X10 STRL (GAUZE/BANDAGES/DRESSINGS) ×1 IMPLANT
PADDING CAST ABS 6INX4YD NS (CAST SUPPLIES) ×2
PADDING CAST ABS COTTON 6X4 NS (CAST SUPPLIES) IMPLANT
PADDING CAST COTTON 6X4 STRL (CAST SUPPLIES) ×6 IMPLANT
POSITIONER SURGICAL ARM (MISCELLANEOUS) ×2 IMPLANT
SET HNDPC FAN SPRY TIP SCT (DISPOSABLE) ×1 IMPLANT
STRIP CLOSURE SKIN 1/2X4 (GAUZE/BANDAGES/DRESSINGS) ×4 IMPLANT
SUT MNCRL AB 4-0 PS2 18 (SUTURE) ×2 IMPLANT
SUT VIC AB 2-0 CT1 27 (SUTURE) ×6
SUT VIC AB 2-0 CT1 TAPERPNT 27 (SUTURE) ×3 IMPLANT
SUT VLOC 180 0 24IN GS25 (SUTURE) ×2 IMPLANT
SYR 50ML LL SCALE MARK (SYRINGE) ×2 IMPLANT
TRAY FOLEY W/METER SILVER 14FR (SET/KITS/TRAYS/PACK) ×1 IMPLANT
TRAY FOLEY W/METER SILVER 16FR (SET/KITS/TRAYS/PACK) ×1 IMPLANT
WATER STERILE IRR 1500ML POUR (IV SOLUTION) ×1 IMPLANT
WRAP KNEE MAXI GEL POST OP (GAUZE/BANDAGES/DRESSINGS) ×2 IMPLANT
YANKAUER SUCT BULB TIP 10FT TU (MISCELLANEOUS) ×2 IMPLANT

## 2015-08-17 NOTE — Transfer of Care (Signed)
Immediate Anesthesia Transfer of Care Note  Patient: Tracy [name]  Procedure(s) Performed: Procedure(s): RIGHT TOTAL KNEE ARTHROPLASTY (Right)  Patient Location: PACU  Anesthesia Type:General  Level of Consciousness:  sedated, patient cooperative and responds to stimulation  Airway & Oxygen Therapy:Patient Spontanous Breathing and Patient connected to face mask oxgen  Post-op Assessment:  Report given to PACU RN and Post -op Vital signs reviewed and stable  Post vital signs:  Reviewed and stable  Last Vitals:  Filed Vitals:   08/17/15 0615  BP: 141/62  Pulse: 66  Temp: 36.6 C  Resp: 18    Complications: No apparent anesthesia complications

## 2015-08-17 NOTE — H&P (View-Only) (Signed)
Tracy [name] DOB: [birth year] Married / Language: English / Race: White Female Date of Admission:  08/17/2015 CC:  Right Knee Pain History of Present Illness The patient is a 65 year old female who comes in for a preoperative History and Physical. The patient is scheduled for a right total knee arthroplasty to be performed by Dr. Dione Plover. Aluisio, MD at [facility] on 08-17-2015. The patient is a 65 year old female who presented for follow up of their knee. The patient is being followed for their right knee pain and osteoarthritis. They are months out from Synvisc series (that did not provide any relief of symptoms). Symptoms reported include: pain (and aching), swelling and instability. The following medication has been used for pain control: antiinflammatory medication (Ibuprofen). States due to favoring going up and down steps she has had the onset of pain in the right shoulder over the past 2 months Unfortunately the viscosupplement injections did not help at all. She has documented advanced arthritis in the right knee, bone on bone changes. She has had cortisone and viscosupplement injections without any benefit. Knee is hurting at all times. It is limiting what she can and cannot do. She is also developing some right shoulder pain from having to use her arms to push up, to get her walking. She has got advanced arthritic change with no response to cortisone and viscosupplements. She has tried some home exercise without benefit. At this point, the most predictive means of improving her pain and function is going to be total knee arthroplasty. Discussed in detail today and she would like to proceed. At this point, the most predictable means of improving pain and function is total knee arthroplasty. The procedure, risks, potential complications and rehab course are discussed in detail and the patient elects to proceed. The goals of this procedure are decreased pain and increased function.  There is a high liklihood that both of these goals will be achieved. They have been treated conservatively in the past for the above stated problem and despite conservative measures, they continue to have progressive pain and severe functional limitations and dysfunction. They have failed non-operative management including home exercise, medications, and injections. It is felt that they would benefit from undergoing total joint replacement. Risks and benefits of the procedure have been discussed with the patient and they elect to proceed with surgery. There are no active contraindications to surgery such as ongoing infection or rapidly progressive neurological disease.  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 Mother  Deceased.  Social History Tobacco use  never smoker Alcohol use  current drinker; drinks wine and hard liquor; only occasionally per week Children  2 Current work status  retired Engineer, agricultural (Currently)  no Drug/Alcohol Rehab (Previously)  no Exercise  Exercises daily; does running / walking Illicit drug use  no Living situation  live with spouse Marital status  married Number of flights of stairs before winded  greater than 5 Pain Contract  no Advance Directives  Living  Will Post-Surgical Plans  Home with husband John following TKA on 08/17/2015  Medication History Omeprazole (40MG  Capsule DR, Oral) Active. Fenofibrate (160MG   Tablet, Oral) Active. Calcium (Oral) Specific strength unknown - Active. Womens One Daily (Oral) Active. Vitamin B Complex (Oral) Active. D3 Super Strength (2000UNIT Capsule, Oral) Active. Cranberry Extract (Oral) Specific strength unknown - Active. Psyllium (58.6% Powder, Oral) Active. Fluticasone Propionate (Nasal) (50MCG/ACT Suspension, Nasal) Active. Premarin (0.625MG /GM Cream, Vaginal) Active. Doxycycline Hyclate (100MG  Tablet, Oral) Active. (For Rosacea Outbreak) Melatonin (5MG  Tablet, Oral) Active.   Past Surgical History Left Arm Fracture Repair  Date: 1955. Appendectomy  Date: 1965. Tonsils  Date: 1966. Tubal Ligation  Date: 05/1978. Right Foot Bunion  Date: 09/1989. Cholecystectomy  Date: 07/2001. Redo Right Foot Bunion Surgery  Date: 03/2006. Arthroscopic Knee Surgery - Right  Date: 08/2008. Low Back Disc Surgery  Date: 12/2008. Left L4-5 Microdisc Surgery Left Hip Bursectomy with Gluteal Tendon Repair  Date: 03/2014.   Review of Systems General Present- Chills, Fatigue and Night Sweats. Not Present- Fever, Memory Loss, Weight Gain and Weight Loss. Skin Not Present- Eczema, Hives, Itching, Lesions and Rash. HEENT Not Present- Dentures, Double Vision, Headache, Hearing Loss, Tinnitus and Visual Loss. Respiratory Not Present- Allergies, Chronic Cough, Coughing up blood, Shortness of breath at rest and Shortness of breath with exertion. Cardiovascular Not Present- Chest Pain, Difficulty Breathing Lying Down, Murmur, Palpitations, Racing/skipping heartbeats and Swelling. Gastrointestinal Present- Heartburn. Not Present- Abdominal Pain, Bloody Stool, Constipation, Diarrhea, Difficulty Swallowing, Jaundice, Loss of appetitie, Nausea and Vomiting. Female Genitourinary Present- Urinary frequency and Urinating at Night. Not Present- Blood in Urine, Discharge, Flank Pain, Incontinence, Painful Urination, Urgency, Urinary Retention and Weak urinary  stream. Musculoskeletal Present- Back Pain, Joint Pain, Joint Swelling, Morning Stiffness, Muscle Pain, Muscle Weakness and Spasms. Neurological Not Present- Blackout spells, Difficulty with balance, Dizziness, Paralysis, Tremor and Weakness. Psychiatric Present- Insomnia.  Vitals  Weight: 175 lb Height: 64in Weight was reported by patient. Height was reported by patient. Body Surface Area: 1.85 m Body Mass Index: 30.04 kg/m  Pulse: 72 (Regular)  BP: 132/78 (Sitting, Right Arm, Standard)  Physical Exam  General Mental Status -Alert, cooperative and good historian. General Appearance-pleasant, Not in acute distress. Orientation-Oriented X3. Build & Nutrition-Well nourished and Well developed.  Head and Neck Head-normocephalic, atraumatic . Neck Global Assessment - supple, no bruit auscultated on the right, no bruit auscultated on the left.  Eye Pupil - Bilateral-Regular and Round. Motion - Bilateral-EOMI.  Chest and Lung Exam Auscultation Breath sounds - clear at anterior chest wall and clear at posterior chest wall. Adventitious sounds - No Adventitious sounds.  Cardiovascular Auscultation Rhythm - Regular rate and rhythm. Heart Sounds - S1 WNL and S2 WNL. Murmurs & Other Heart Sounds - Auscultation of the heart reveals - No Murmurs.  Abdomen Palpation/Percussion Tenderness - Abdomen is non-tender to palpation. Rigidity (guarding) - Abdomen is soft. Auscultation Auscultation of the abdomen reveals - Bowel sounds normal.  Female Genitourinary Note: Not done, not pertinent to present illness   Musculoskeletal Note: She is alert and oriented, in no apparent distress. Evaluation of her right knee shows no effusion. Range about 5 to 125 with marked crepitus on range of motion, tenderness medial and lateral with no instability noted. Evaluation of her right shoulder shows normal range of motion. She has positive impingement to forward elevation and  crossed adduction. Her strength is intact.  Assessment & Plan Primary osteoarthritis of right knee (M17.11)  Note:Surgical Plans: Left Right Total Knee Hip Replacement -  Anterior Approach  Disposition: Home  PCP: Dr. Birdie Riddle  IV TXA  Anesthesia Issues: Nusea and vomiting in the past with Demerol in the postop period.  VERITAS STUDY PATIENT Traditional PT Postop   Signed electronically by Ok Edwards, III PA-C

## 2015-08-17 NOTE — Anesthesia Procedure Notes (Signed)
Procedure Name: Intubation Date/Time: 08/17/2015 8:00 AM Performed by: Roselin Wiemann, Virgel Gess Pre-anesthesia Checklist: Patient identified, Emergency Drugs available, Suction available, Patient being monitored and Timeout performed Patient Re-evaluated:Patient Re-evaluated prior to inductionOxygen Delivery Method: Circle system utilized Preoxygenation: Pre-oxygenation with 100% oxygen Intubation Type: IV induction Ventilation: Mask ventilation without difficulty Laryngoscope Size: Mac and 4 Grade View: Grade II Tube type: Oral Tube size: 7.0 mm Number of attempts: 1 Airway Equipment and Method: Stylet Placement Confirmation: ETT inserted through vocal cords under direct vision,  positive ETCO2,  CO2 detector and breath sounds checked- equal and bilateral Secured at: 21 cm Tube secured with: Tape Dental Injury: Teeth and Oropharynx as per pre-operative assessment

## 2015-08-17 NOTE — Anesthesia Postprocedure Evaluation (Signed)
Anesthesia Post Note  Patient: [name]  Procedure(s) Performed: Procedure(s) (LRB): RIGHT TOTAL KNEE ARTHROPLASTY (Right)  Patient location during evaluation: PACU Anesthesia Type: General Level of consciousness: awake and alert Pain management: pain level controlled Vital Signs Assessment: post-procedure vital signs reviewed and stable Respiratory status: spontaneous breathing, nonlabored ventilation, respiratory function stable and patient connected to nasal cannula oxygen Cardiovascular status: blood pressure returned to baseline and stable Postop Assessment: no signs of nausea or vomiting Anesthetic complications: no    Last Vitals:  Filed Vitals:   08/17/15 1000 08/17/15 1015  BP: 131/74 130/64  Pulse: 73 73  Temp:    Resp: 13 10    Last Pain:  Filed Vitals:   08/17/15 1023  PainSc: Asleep                 Montez Hageman

## 2015-08-17 NOTE — Interval H&P Note (Signed)
History and Physical Interval Note:  08/17/2015 6:43 AM  Tracy [name]  has presented today for surgery, with the diagnosis of RIGHT KNEE OA   The various methods of treatment have been discussed with the patient and family. After consideration of risks, benefits and other options for treatment, the patient has consented to  Procedure(s): RIGHT TOTAL KNEE ARTHROPLASTY (Right) as a surgical intervention .  The patient's history has been reviewed, patient examined, no change in status, stable for surgery.  I have reviewed the patient's chart and labs.  Questions were answered to the patient's satisfaction.     Gearlean Alf

## 2015-08-17 NOTE — Op Note (Signed)
Pre-operative diagnosis- Osteoarthritis  Right knee(s)  Post-operative diagnosis- Osteoarthritis Right knee(s)  Procedure-  Right  Total Knee Arthroplasty (Depuy Attune)  Surgeon- Dione Plover. Nairi Oswald, MD  Assistant- Molli Barrows, PA-C   Anesthesia-  General  EBL-* No blood loss amount entered *   Drains Hemovac  Tourniquet time-  Total Tourniquet Time Documented: Thigh (Right) - 34 minutes Total: Thigh (Right) - 34 minutes     Complications- None  Condition-PACU - hemodynamically stable.   Brief Clinical Note  Tracy [name] is a 65 y.o. year old female with end stage OA of her right knee with progressively worsening pain and dysfunction. She has constant pain, with activity and at rest and significant functional deficits with difficulties even with ADLs. She has had extensive non-op management including analgesics, injections of cortisone and viscosupplements, and home exercise program, but remains in significant pain with significant dysfunction.Radiographs show bone on bone arthritis patellofemoral with lateral narrowing. She presents now for right Total Knee Arthroplasty.    Procedure in detail---   The patient is brought into the operating room and positioned supine on the operating table. After successful administration of  General,   a tourniquet is placed high on the  Right thigh(s) and the lower extremity is prepped and draped in the usual sterile fashion. Time out is performed by the operating team and then the  Right lower extremity is wrapped in Esmarch, knee flexed and the tourniquet inflated to 300 mmHg.       A midline incision is made with a ten blade through the subcutaneous tissue to the level of the extensor mechanism. A fresh blade is used to make a medial parapatellar arthrotomy. Soft tissue over the proximal medial tibia is subperiosteally elevated to the joint line with a knife and into the semimembranosus bursa with a Cobb elevator. Soft tissue over the  proximal lateral tibia is elevated with attention being paid to avoiding the patellar tendon on the tibial tubercle. The patella is everted, knee flexed 90 degrees and the ACL and PCL are removed. Findings are bone on bone lateral and patellofemoral with lateral osteophytes.        The drill is used to create a starting hole in the distal femur and the canal is thoroughly irrigated with sterile saline to remove the fatty contents. The 5 degree Right  valgus alignment guide is placed into the femoral canal and the distal femoral cutting block is pinned to remove 9 mm off the distal femur. Resection is made with an oscillating saw.      The tibia is subluxed forward and the menisci are removed. The extramedullary alignment guide is placed referencing proximally at the medial aspect of the tibial tubercle and distally along the second metatarsal axis and tibial crest. The block is pinned to remove 86mm off the more deficient lateral  side. Resection is made with an oscillating saw. Size 4is the most appropriate size for the tibia and the proximal tibia is prepared with the modular drill and keel punch for that size.      The femoral sizing guide is placed and size 5 is most appropriate. Rotation is marked off the epicondylar axis and confirmed by creating a rectangular flexion gap at 90 degrees. The size 5 cutting block is pinned in this rotation and the anterior, posterior and chamfer cuts are made with the oscillating saw. The intercondylar block is then placed and that cut is made.      Trial size 4 tibial  component, trial size 5 narrow posterior stabilized femur and a 6  mm posterior stabilized rotating platform insert trial is placed. Full extension is achieved with excellent varus/valgus and anterior/posterior balance throughout full range of motion. The patella is everted and thickness measured to be 22  mm. Free hand resection is taken to 12 mm, a 35 template is placed, lug holes are drilled, trial patella  is placed, and it tracks normally. Osteophytes are removed off the posterior femur with the trial in place. All trials are removed and the cut bone surfaces prepared with pulsatile lavage. Cement is mixed and once ready for implantation, the size 4 tibial implant, size  5 narrow posterior stabilized femoral component, and the size 35 patella are cemented in place and the patella is held with the clamp. The trial insert is placed and the knee held in full extension. The Exparel (20 ml mixed with 30 ml saline) and .25% Bupivicaine, are injected into the extensor mechanism, posterior capsule, medial and lateral gutters and subcutaneous tissues.  All extruded cement is removed and once the cement is hard the permanent 6 mm posterior stabilized rotating platform insert is placed into the tibial tray.      The wound is copiously irrigated with saline solution and the extensor mechanism closed over a hemovac drain with #1 V-loc suture. The tourniquet is released for a total tourniquet time of 34  minutes. Flexion against gravity is 140 degrees and the patella tracks normally. Subcutaneous tissue is closed with 2.0 vicryl and subcuticular with running 4.0 Monocryl. The incision is cleaned and dried and steri-strips and a bulky sterile dressing are applied. The limb is placed into a knee immobilizer and the patient is awakened and transported to recovery in stable condition.      Please note that a surgical assistant was a medical necessity for this procedure in order to perform it in a safe and expeditious manner. Surgical assistant was necessary to retract the ligaments and vital neurovascular structures to prevent injury to them and also necessary for proper positioning of the limb to allow for anatomic placement of the prosthesis.   Dione Plover Chanah Tidmore, MD    08/17/2015, 8:56 AM

## 2015-08-17 NOTE — Evaluation (Signed)
Physical Therapy Evaluation Patient Details Name: Tracy [name] MRN: [id number] DOB: 1951/02/17 Today's Date: 08/17/2015   History of Present Illness  s/p RTKA , with a left bursectomy and repair of gluteal tendon about 1 year ago. 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   Clinical Impression  Pt s/p RTKA presents with decreased ROM and strength resulting in decreased mobility at this time. Pt to benefit from PT to safely learn how to mobilize and navigate steps to safely DC home with spouse assisting.     Follow Up Recommendations Home health PT    Equipment Recommendations  None recommended by PT    Recommendations for Other Services       Precautions / Restrictions Precautions Precautions: Knee Required Braces or Orthoses: Knee Immobilizer - Right Knee Immobilizer - Right: Discontinue once straight leg raise with < 10 degree lag Restrictions Weight Bearing Restrictions: No      Mobility  Bed Mobility Overal bed mobility: Needs Assistance Bed Mobility: Supine to Sit;Sit to Supine     Supine to sit: Min assist     General bed mobility comments: assistance with R LE and upper body   Transfers Overall transfer level: Needs assistance Equipment used: Rolling walker (2 wheeled) Transfers: Sit to/from Stand Sit to Stand: Mod assist         General transfer comment: assist for lift off , however limited with use of L hand for the placment of her IV in her wrist was really painful for her to use for push off or on the RW  Ambulation/Gait Ambulation/Gait assistance: Mod assist Ambulation Distance (Feet): 10 Feet (followed by chair due to fatigued quickly ) Assistive device: Rolling walker (2 wheeled) Gait Pattern/deviations: Step-to pattern     General Gait Details: cues for gait sequence with RW   Stairs            Wheelchair Mobility    Modified Rankin (Stroke Patients Only)       Balance                                              Pertinent Vitals/Pain Pain Assessment: 0-10 Pain Score: 4  Pain Location: R knee , in the posterios area when standing  Pain Descriptors / Indicators: Aching Pain Intervention(s): Monitored during session;Premedicated before session;Ice applied;Repositioned    Home Living Family/patient expects to be discharged to:: Private residence Living Arrangements: Spouse/significant other Available Help at Discharge: Family Type of Home: House Home Access: Stairs to enter Entrance Stairs-Rails: None Entrance Stairs-Number of Steps: 2 Home Layout: Two level;Bed/bath upstairs (13 steps with no landing in middle ) Home Equipment: Walker - 2 wheels;Crutches Additional Comments: Pt stated she may need something for her toilet, will cover with the occupational therapist. Also concerned with how to get into her high bed, and therapy will cover this in our sessions.     Prior Function Level of Independence: Independent               Hand Dominance        Extremity/Trunk Assessment   Upper Extremity Assessment: Defer to OT evaluation           Lower Extremity Assessment: RLE deficits/detail RLE Deficits / Details: grossly 4-/5 and knee rom supine 0-60       Communication  Communication: No difficulties  Cognition Arousal/Alertness: Awake/alert Behavior During Therapy: WFL for tasks assessed/performed Overall Cognitive Status: Within Functional Limits for tasks assessed                      General Comments      Exercises Total Joint Exercises Ankle Circles/Pumps: AROM;Both;10 reps;Supine Quad Sets: AROM;Right;Supine;10 reps Heel Slides: AAROM;Right;10 reps;Supine Straight Leg Raises: AAROM;Right;5 reps;Supine      Assessment/Plan    PT Assessment Patient needs continued PT services  PT Diagnosis Difficulty walking   PT Problem List Decreased strength;Decreased range of motion;Decreased activity  tolerance;Decreased mobility;Decreased knowledge of use of DME  PT Treatment Interventions Gait training;DME instruction;Stair training;Functional mobility training;Therapeutic activities;Therapeutic exercise;Patient/family education   PT Goals (Current goals can be found in the Care Plan section) Acute Rehab PT Goals Patient Stated Goal: I want to be able to get home safe PT Goal Formulation: With patient Time For Goal Achievement: 08/31/15 Potential to Achieve Goals: Good    Frequency 7X/week   Barriers to discharge        Co-evaluation               End of Session Equipment Utilized During Treatment: Gait belt Activity Tolerance: Patient tolerated treatment well;Patient limited by fatigue Patient left: in chair;with call bell/phone within reach;with chair alarm set;with family/visitor present Nurse Communication: Mobility status         Time: 1725-1800 PT Time Calculation (min) (ACUTE ONLY): 35 min   Charges:   PT Evaluation $PT Eval Low Complexity: 1 Procedure PT Treatments $Gait Training: 8-22 mins $Therapeutic Exercise: 8-22 mins   PT G CodesClide Dales 2015-08-27, 8:08 PM  Clide Dales, PT Pager: [phone] 27-Aug-2015

## 2015-08-17 NOTE — Discharge Instructions (Addendum)
° °Dr. Frank Aluisio °Total Joint Specialist °[facility] °[address] °, [place] [address] °[phone] ° °TOTAL KNEE REPLACEMENT POSTOPERATIVE DIRECTIONS ° °Knee Rehabilitation, Guidelines Following Surgery  °Results after knee surgery are often greatly improved when you follow the exercise, range of motion and muscle strengthening exercises prescribed by your doctor. Safety measures are also important to protect the knee from further injury. Any time any of these exercises cause you to have increased pain or swelling in your knee joint, decrease the amount until you are comfortable again and slowly increase them. If you have problems or questions, call your caregiver or physical therapist for advice.  ° °HOME CARE INSTRUCTIONS  °Remove items at home which could result in a fall. This includes throw rugs or furniture in walking pathways.  °· ICE to the affected knee every three hours for 30 minutes at a time and then as needed for pain and swelling.  Continue to use ice on the knee for pain and swelling from surgery. You may notice swelling that will progress down to the foot and ankle.  This is normal after surgery.  Elevate the leg when you are not up walking on it.   °· Continue to use the breathing machine which will help keep your temperature down.  It is common for your temperature to cycle up and down following surgery, especially at night when you are not up moving around and exerting yourself.  The breathing machine keeps your lungs expanded and your temperature down. °· Do not place pillow under knee, focus on keeping the knee straight while resting ° °DIET °You may resume your previous home diet once your are discharged from the hospital. ° °DRESSING / WOUND CARE / SHOWERING °You may start showering once you are discharged home but do not submerge the incision under water. Just pat the incision dry and apply a dry gauze dressing on daily. °Change the surgical dressing  daily and reapply a dry dressing each time. ° °ACTIVITY °Walk with your walker as instructed. °Use walker as long as suggested by your caregivers. °Avoid periods of inactivity such as sitting longer than an hour when not asleep. This helps prevent blood clots.  °You may resume a sexual relationship in one month or when given the OK by your doctor.  °You may return to work once you are cleared by your doctor.  °Do not drive a car for 6 weeks or until released by you surgeon.  °Do not drive while taking narcotics. ° °WEIGHT BEARING °Weight bearing as tolerated with assist device (walker, cane, etc) as directed, use it as long as suggested by your surgeon or therapist, typically at least 4-6 weeks. ° °POSTOPERATIVE CONSTIPATION PROTOCOL °Constipation - defined medically as fewer than three stools per week and severe constipation as less than one stool per week. ° °One of the most common issues patients have following surgery is constipation.  Even if you have a regular bowel pattern at home, your normal regimen is likely to be disrupted due to multiple reasons following surgery.  Combination of anesthesia, postoperative narcotics, change in appetite and fluid intake all can affect your bowels.  In order to avoid complications following surgery, here are some recommendations in order to help you during your recovery period. ° °Colace (docusate) - Pick up an over-the-counter form of Colace or another stool softener and take twice a day as long as you are requiring postoperative pain medications.  Take with a full glass of water   daily.  If you experience loose stools or diarrhea, hold the colace until you stool forms back up.  If your symptoms do not get better within 1 week or if they get worse, check with your doctor. ° °Dulcolax (bisacodyl) - Pick up over-the-counter and take as directed by the product packaging as needed to assist with the movement of your bowels.  Take with a full glass of water.  Use this product as  needed if not relieved by Colace only.  ° °MiraLax (polyethylene glycol) - Pick up over-the-counter to have on hand.  MiraLax is a solution that will increase the amount of water in your bowels to assist with bowel movements.  Take as directed and can mix with a glass of water, juice, soda, coffee, or tea.  Take if you go more than two days without a movement. °Do not use MiraLax more than once per day. Call your doctor if you are still constipated or irregular after using this medication for 7 days in a row. ° °If you continue to have problems with postoperative constipation, please contact the office for further assistance and recommendations.  If you experience "the worst abdominal pain ever" or develop nausea or vomiting, please contact the office immediatly for further recommendations for treatment. ° °ITCHING ° If you experience itching with your medications, try taking only a single pain pill, or even half a pain pill at a time.  You can also use Benadryl over the counter for itching or also to help with sleep.  ° °TED HOSE STOCKINGS °Wear the elastic stockings on both legs for three weeks following surgery during the day but you may remove then at night for sleeping. ° °MEDICATIONS °See your medication summary on the “After Visit Summary” that the nursing staff will review with you prior to discharge.  You may have some home medications which will be placed on hold until you complete the course of blood thinner medication.  It is important for you to complete the blood thinner medication as prescribed by your surgeon.  Continue your approved medications as instructed at time of discharge. ° °PRECAUTIONS °If you experience chest pain or shortness of breath - call 911 immediately for transfer to the hospital emergency department.  °If you develop a fever greater that 101 F, purulent drainage from wound, increased redness or drainage from wound, foul odor from the wound/dressing, or calf pain - CONTACT YOUR  SURGEON.   °                                                °FOLLOW-UP APPOINTMENTS °Make sure you keep all of your appointments after your operation with your surgeon and caregivers. You should call the office at the above phone number and make an appointment for approximately two weeks after the date of your surgery or on the date instructed by your surgeon outlined in the "After Visit Summary". ° ° °RANGE OF MOTION AND STRENGTHENING EXERCISES  °Rehabilitation of the knee is important following a knee injury or an operation. After just a few days of immobilization, the muscles of the thigh which control the knee become weakened and shrink (atrophy). Knee exercises are designed to build up the tone and strength of the thigh muscles and to improve knee motion. Often times heat used for twenty to thirty minutes before working out will loosen   up your tissues and help with improving the range of motion but do not use heat for the first two weeks following surgery. These exercises can be done on a training (exercise) mat, on the floor, on a table or on a bed. Use what ever works the best and is most comfortable for you Knee exercises include:  Leg Lifts - While your knee is still immobilized in a splint or cast, you can do straight leg raises. Lift the leg to 60 degrees, hold for 3 sec, and slowly lower the leg. Repeat 10-20 times 2-3 times daily. Perform this exercise against resistance later as your knee gets better.  Quad and Hamstring Sets - Tighten up the muscle on the front of the thigh (Quad) and hold for 5-10 sec. Repeat this 10-20 times hourly. Hamstring sets are done by pushing the foot backward against an object and holding for 5-10 sec. Repeat as with quad sets.   Leg Slides: Lying on your back, slowly slide your foot toward your buttocks, bending your knee up off the floor (only go as far as is comfortable). Then slowly slide your foot back down until your leg is flat on the floor again.  Angel Wings:  Lying on your back spread your legs to the side as far apart as you can without causing discomfort.  A rehabilitation program following serious knee injuries can speed recovery and prevent re-injury in the future due to weakened muscles. Contact your doctor or a physical therapist for more information on knee rehabilitation.   IF YOU ARE TRANSFERRED TO A SKILLED REHAB FACILITY If the patient is transferred to a skilled rehab facility following release from the hospital, a list of the current medications will be sent to the facility for the patient to continue.  When discharged from the skilled rehab facility, please have the facility set up the patient's [place] prior to being released. Also, the skilled facility will be responsible for providing the patient with their medications at time of release from the facility to include their pain medication, the muscle relaxants, and their blood thinner medication. If the patient is still at the rehab facility at time of the two week follow up appointment, the skilled rehab facility will also need to assist the patient in arranging follow up appointment in our office and any transportation needs.  MAKE SURE YOU:  Understand these instructions.  Get help right away if you are not doing well or get worse.    Pick up stool softner and laxative for home use following surgery while on pain medications. Do not submerge incision under water. Please use good hand washing techniques while changing dressing each day. May shower starting three days after surgery. Please use a clean towel to pat the incision dry following showers. Continue to use ice for pain and swelling after surgery. Do not use any lotions or creams on the incision until instructed by your surgeon.  Information on my medicine - XARELTO (Rivaroxaban)  This medication education was reviewed with me or my healthcare representative as part of my discharge preparation.  The  pharmacist that spoke with me during my hospital stay was:  Biagio Borg, [facility]  Why was Xarelto prescribed for you? Xarelto was prescribed for you to reduce the risk of blood clots forming after orthopedic surgery. The medical term for these abnormal blood clots is venous thromboembolism (VTE).  What do you need to know about xarelto ? Take your Xarelto ONCE DAILY at  the same time every day. You may take it either with or without food.  If you have difficulty swallowing the tablet whole, you may crush it and mix in applesauce just prior to taking your dose.  Take Xarelto exactly as prescribed by your doctor and DO NOT stop taking Xarelto without talking to the doctor who prescribed the medication.  Stopping without other VTE prevention medication to take the place of Xarelto may increase your risk of developing a clot.  After discharge, you should have regular check-up appointments with your healthcare provider that is prescribing your Xarelto.    What do you do if you miss a dose? If you miss a dose, take it as soon as you remember on the same day then continue your regularly scheduled once daily regimen the next day. Do not take two doses of Xarelto on the same day.   Important Safety Information A possible side effect of Xarelto is bleeding. You should call your healthcare provider right away if you experience any of the following: ? Bleeding from an injury or your nose that does not stop. ? Unusual colored urine (red or dark brown) or unusual colored stools (red or black). ? Unusual bruising for unknown reasons. ? A serious fall or if you hit your head (even if there is no bleeding).  Some medicines may interact with Xarelto and might increase your risk of bleeding while on Xarelto. To help avoid this, consult your healthcare provider or pharmacist prior to using any new prescription or non-prescription medications, including herbals, vitamins, non-steroidal  anti-inflammatory drugs (NSAIDs) and supplements.  This website has more information on Xarelto: [email or web address]

## 2015-08-18 LAB — BASIC METABOLIC PANEL
ANION GAP: 4 — AB (ref 5–15)
BUN: 14 mg/dL (ref 6–20)
CHLORIDE: 111 mmol/L (ref 101–111)
CO2: 28 mmol/L (ref 22–32)
Calcium: 9.1 mg/dL (ref 8.9–10.3)
Creatinine, Ser: 0.61 mg/dL (ref 0.44–1.00)
GFR calc Af Amer: >60 mL/min (ref >60)
GFR calc non Af Amer: >60 mL/min (ref >60)
GLUCOSE: 97 mg/dL (ref 65–99)
POTASSIUM: 4.1 mmol/L (ref 3.5–5.1)
Sodium: 143 mmol/L (ref 135–145)

## 2015-08-18 LAB — CBC
HEMATOCRIT: 33.7 % — AB (ref 36.0–46.0)
HEMOGLOBIN: 11.7 g/dL — AB (ref 12.0–15.0)
MCH: 31.5 pg (ref 26.0–34.0)
MCHC: 34.7 g/dL (ref 30.0–36.0)
MCV: 90.6 fL (ref 78.0–100.0)
Platelets: 194 10*3/uL (ref 150–400)
RBC: 3.72 MIL/uL — AB (ref 3.87–5.11)
RDW: 12.8 % (ref 11.5–15.5)
WBC: 8.9 10*3/uL (ref 4.0–10.5)

## 2015-08-18 MED ORDER — SODIUM CHLORIDE 0.9 % IV BOLUS (SEPSIS)
250.0000 mL | Freq: Once | INTRAVENOUS | Status: AC
  Administered 2015-08-18: 250 mL via INTRAVENOUS

## 2015-08-18 NOTE — Progress Notes (Signed)
Physical Therapy Treatment Patient Details Name: Tracy [name] MRN: [id number] DOB: 1951/01/12 Today's Date: 08/18/2015    History of Present Illness s/p RTKA , with a left bursectomy and repair of gluteal tendon about 1 year ago. 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     PT Comments    POD # 1 am session assisted OOB to bathroom then amb in hallway.  Performed TKR TE's followed by ICE.  Follow Up Recommendations  Home health PT     Equipment Recommendations  None recommended by PT    Recommendations for Other Services       Precautions / Restrictions Precautions Precautions: Knee Precaution Comments: instructed on KI use for amb and stairs Required Braces or Orthoses: Knee Immobilizer - Right Knee Immobilizer - Right: Discontinue once straight leg raise with < 10 degree lag Restrictions Weight Bearing Restrictions: No    Mobility  Bed Mobility Overal bed mobility: Needs Assistance Bed Mobility: Supine to Sit;Sit to Supine     Supine to sit: Min assist     General bed mobility comments: assist for RLE to eob and back to bed  Transfers Overall transfer level: Needs assistance Equipment used: Rolling walker (2 wheeled) Transfers: Sit to/from Stand Sit to Stand: Min assist;Min guard         General transfer comment: one VC safety with turns  Ambulation/Gait Ambulation/Gait assistance: Min assist Ambulation Distance (Feet): 32 Feet Assistive device: Rolling walker (2 wheeled) Gait Pattern/deviations: Step-to pattern;Decreased stance time - right Gait velocity: decreased   General Gait Details: cues for gait sequence with RW plus increased time   Stairs            Wheelchair Mobility    Modified Rankin (Stroke Patients Only)       Balance                                    Cognition Arousal/Alertness: Awake/alert Behavior During Therapy: WFL for tasks assessed/performed Overall  Cognitive Status: Within Functional Limits for tasks assessed                      Exercises   Total Knee Replacement TE's 10 reps B LE ankle pumps 10 reps towel squeezes 10 reps knee presses 10 reps heel slides  10 reps SAQ's 10 reps SLR's 10 reps ABD Followed by ICE     General Comments        Pertinent Vitals/Pain Pain Assessment: 0-10 Pain Score: 3  Pain Location: R knee Pain Descriptors / Indicators: Aching;Sore Pain Intervention(s): Monitored during session;Premedicated before session;Repositioned;Ice applied    Home Living Family/patient expects to be discharged to:: Private residence Living Arrangements: Spouse/significant other Available Help at Discharge: Family         Home Equipment: Gilford Rile - 2 wheels;Crutches      Prior Function Level of Independence: Independent          PT Goals (current goals can now be found in the care plan section) Acute Rehab PT Goals Patient Stated Goal: wants to be as independent as possible, but not sure she wants reacher Progress towards PT goals: Progressing toward goals    Frequency  7X/week    PT Plan Current plan remains appropriate    Co-evaluation             End of Session  Equipment Utilized During Treatment: Gait belt Activity Tolerance: Patient tolerated treatment well;Patient limited by fatigue Patient left: in chair;with call bell/phone within reach;with chair alarm set;with family/visitor present     Time: 1002-1046 PT Time Calculation (min) (ACUTE ONLY): 44 min  Charges:  $Gait Training: 8-22 mins $Therapeutic Exercise: 8-22 mins $Therapeutic Activity: 8-22 mins                    G Codes:      Rica Koyanagi  PTA [facility]  Acute  Rehab Pager      [phone]

## 2015-08-18 NOTE — Progress Notes (Signed)
Physical Therapy Treatment Patient Details Name: Tracy [name] MRN: [id number] DOB: 1950/08/27 Today's Date: 08/18/2015    History of Present Illness s/p RTKA , with a left bursectomy and repair of gluteal tendon about 1 year ago. 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     PT Comments    POD # 1 pm session Assisted to bathroom then amb a greater distance in hallway.  Finished TE's then applied ICE.  Pt progressing well.   Follow Up Recommendations  Home health PT     Equipment Recommendations  None recommended by PT    Recommendations for Other Services       Precautions / Restrictions Precautions Precautions: Knee Precaution Comments: instructed on KI use for amb and stairs Required Braces or Orthoses: Knee Immobilizer - Right Knee Immobilizer - Right: Discontinue once straight leg raise with < 10 degree lag Restrictions Weight Bearing Restrictions: No    Mobility  Bed Mobility Overal bed mobility: Needs Assistance Bed Mobility: Supine to Sit;Sit to Supine     Supine to sit: Min assist     General bed mobility comments: assist for RLE to eob and back to bed  Transfers Overall transfer level: Needs assistance Equipment used: Rolling walker (2 wheeled) Transfers: Sit to/from Stand Sit to Stand: Min assist;Min guard         General transfer comment: one VC safety with turns  Ambulation/Gait Ambulation/Gait assistance: Min assist Ambulation Distance (Feet): 41 Feet Assistive device: Rolling walker (2 wheeled) Gait Pattern/deviations: Step-to pattern;Decreased stance time - right Gait velocity: decreased   General Gait Details: cues for gait sequence with RW plus increased time   Stairs            Wheelchair Mobility    Modified Rankin (Stroke Patients Only)       Balance                                    Cognition Arousal/Alertness: Awake/alert Behavior During Therapy: WFL for  tasks assessed/performed Overall Cognitive Status: Within Functional Limits for tasks assessed                      Exercises      General Comments        Pertinent Vitals/Pain Pain Assessment: 0-10 Pain Score: 3  Pain Location: R knee Pain Descriptors / Indicators: Aching;Sore Pain Intervention(s): Monitored during session;Premedicated before session;Repositioned;Ice applied    Home Living Family/patient expects to be discharged to:: Private residence Living Arrangements: Spouse/significant other Available Help at Discharge: Family         Home Equipment: Gilford Rile - 2 wheels;Crutches      Prior Function Level of Independence: Independent          PT Goals (current goals can now be found in the care plan section) Acute Rehab PT Goals Patient Stated Goal: wants to be as independent as possible, but not sure she wants reacher Progress towards PT goals: Progressing toward goals    Frequency  7X/week    PT Plan Current plan remains appropriate    Co-evaluation             End of Session Equipment Utilized During Treatment: Gait belt Activity Tolerance: Patient tolerated treatment well;Patient limited by fatigue Patient left: in chair;with call bell/phone within reach;with chair alarm set;with family/visitor present  Time: 1340-1405 PT Time Calculation (min) (ACUTE ONLY): 25 min  Charges:  $Gait Training: 8-22 mins $Therapeutic Activity: 8-22 mins                    G Codes:      Rica Koyanagi  PTA [facility]  Acute  Rehab Pager      [phone]

## 2015-08-18 NOTE — Evaluation (Signed)
Occupational Therapy Evaluation Patient Details Name: DESHANA WIEMER MRN: [id number] DOB: [birth year] Today's Date: 08/18/2015    History of Present Illness s/p RTKA , with a left bursectomy and repair of gluteal tendon about 1 year ago. 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    Clinical Impression   This 65 year old female was admitted for the above surgery. Pt did not transfer to [facility] with OT but verbalizes understanding and comfort with this.  Educated on AE, but pt did not feel she needed additional practice and she plans to sponge bathe initially.  No further acute needs    Follow Up Recommendations  No OT follow up    Equipment Recommendations  3 in 1 bedside comode    Recommendations for Other Services       Precautions / Restrictions Precautions Precautions: Knee Knee Immobilizer - Right: Discontinue once straight leg raise with < 10 degree lag Restrictions Weight Bearing Restrictions: No      Mobility Bed Mobility         Supine to sit: Min assist     General bed mobility comments: assist for RLE to eob and back to bed  Transfers                 General transfer comment: not tested this session    Balance                                            ADL Overall ADL's : Needs assistance/impaired             Lower Body Bathing: Minimal assistance;Sit to/from stand       Lower Body Dressing: Moderate assistance;Sit to/from stand                 General ADL Comments: pt sat EOB--had just used bathroom and feels comfortable with transfer.  She is interested in 3:1 over toilet.  Educated on AE and leg lifter and also reviewed methods to selff-assist RLE.  Pt's husband will assist with adls.  Educated on tub readiness and tub bench vs sitting on 3:1 facing outward with legs supported and inexpensive shower curtain liner cut to minimize water escape     Vision     Perception      Praxis      Pertinent Vitals/Pain Pain Score: 6  Pain Location: R knee Pain Descriptors / Indicators: Aching Pain Intervention(s): Limited activity within patient's tolerance;Monitored during session;Premedicated before session;Repositioned;Ice applied     Hand Dominance     Extremity/Trunk Assessment Upper Extremity Assessment Upper Extremity Assessment: Overall WFL for tasks assessed           Communication Communication Communication: No difficulties   Cognition Arousal/Alertness: Awake/alert Behavior During Therapy: WFL for tasks assessed/performed Overall Cognitive Status: Within Functional Limits for tasks assessed                     General Comments       Exercises       Shoulder Instructions      Home Living Family/patient expects to be discharged to:: Private residence Living Arrangements: Spouse/significant other Available Help at Discharge: Family               Bathroom Shower/Tub: Tub/shower unit Shower/tub characteristics: Architectural technologist: Standard  Home Equipment: Gilford Rile - 2 wheels;Crutches          Prior Functioning/Environment Level of Independence: Independent             OT Diagnosis: Acute pain   OT Problem List:     OT Treatment/Interventions:      OT Goals(Current goals can be found in the care plan section) Acute Rehab OT Goals Patient Stated Goal: wants to be as independent as possible, but not sure she wants reacher OT Goal Formulation: All assessment and education complete, DC therapy  OT Frequency:     Barriers to D/C:            Co-evaluation              End of Session    Activity Tolerance: Patient limited by pain Patient left: in bed;with call bell/phone within reach   Time: [id number] OT Time Calculation (min): 21 min Charges:  OT General Charges $OT Visit: 1 Procedure OT Evaluation $OT Eval Low Complexity: 1 Procedure G-Codes:    Abigayle Wilinski 08/29/15,  1:51 PM  Lesle Chris, OTR/L [phone] 2015/08/29

## 2015-08-18 NOTE — Progress Notes (Signed)
   Subjective: 1 Day Post-Op Procedure(s) (LRB): RIGHT TOTAL KNEE ARTHROPLASTY (Right) Patient reports pain as mild.   Patient seen in rounds with Dr. Wynelle Link. Patient is well, but has had some minor complaints of nausea/vomiting. Reports that she walked with therapy yesterday. No SOB or chest pain.    Objective: Vital signs in last 24 hours: Temp:  [97.3 F (36.3 C)-97.9 F (36.6 C)] 97.3 F (36.3 C) (06/20 0140) Pulse Rate:  [68-86] 71 (06/20 0140) Resp:  [8-18] 16 (06/20 0140) BP: (92-146)/(40-74) 92/40 mmHg (06/20 0140) SpO2:  [96 %-100 %] 96 % (06/20 0140)  Intake/Output from previous day:  Intake/Output Summary (Last 24 hours) at 08/18/15 0718 Last data filed at 08/18/15 0644  Gross per 24 hour  Intake 4088.75 ml  Output   3920 ml  Net 168.75 ml     Labs:  Recent Labs  08/18/15 0428  HGB 11.7*    Recent Labs  08/18/15 0428  WBC 8.9  RBC 3.72*  HCT 33.7*  PLT 194    Recent Labs  08/18/15 0428  NA 143  K 4.1  CL 111  CO2 28  BUN 14  CREATININE 0.61  GLUCOSE 97  CALCIUM 9.1    EXAM General - Patient is Alert and Oriented Extremity - Neurologically intact Intact pulses distally Dorsiflexion/Plantar flexion intact Dressing - dressing C/D/I Motor Function - intact, moving foot and toes well on exam.  Hemovac pulled without difficulty.  Past Medical History  Diagnosis Date  . Diverticulitis   . GERD (gastroesophageal reflux disease)   . Hyperlipidemia   . Complication of anesthesia   . PONV (postoperative nausea and vomiting)   . Bursitis of left hip   . Arthritis   . Osteopenia   . Rosacea   . Hx: UTI (urinary tract infection)   . Hyperthyroidism   . Anxiety   . Cancer (HCC)     basil cell    Assessment/Plan: 1 Day Post-Op Procedure(s) (LRB): RIGHT TOTAL KNEE ARTHROPLASTY (Right) Principal Problem:   OA (osteoarthritis) of knee  Estimated body mass index is 30.02 kg/(m^2) as calculated from the following:   Height as of  this encounter: 5\' 4"  (1.626 m).   Weight as of this encounter: 79.379 kg (175 lb). Advance diet Up with therapy D/C IV fluids when tolerating POs well and once BP responds to bolus  DVT Prophylaxis - Xarelto Weight-Bearing as tolerated D/C O2 and Pulse OX and try on Room Air  Will give bolus to help BP. Will watch Hgb. Will continue therapy this morning. Plan for Dc home tomorrow.  Ardeen Jourdain, PA-C Orthopaedic Surgery 08/18/2015, 7:18 AM

## 2015-08-18 NOTE — Care Management Note (Signed)
Case Management Note  Patient Details  Name: MAYLA BIDDY MRN: [id number] Date of Birth: 08-23-1950  Subjective/Objective:                  RIGHT TOTAL KNEE ARTHROPLASTY (Right) Action/Plan: Discharge planning Expected Discharge Date:  08/19/15               Expected Discharge Plan:  [place]  In-House Referral:     Discharge planning Services  CM Consult  Post Acute Care Choice:    Choice offered to:  Patient  DME Arranged:  3-N-1 DME Agency:  [place]:  PT [place]:  [facility] (now [facility])  Status of Service:  Completed, signed off  If discussed at [employer] of Stay Meetings, dates discussed:    Additional Comments: CM met with pt in room to offer choice of home health agency.  Pt chooses [facility] to render HHPT.  Referral given to [facility] rep, Tim (on unit).  Cm called [facility] DME rep, Germaine to please deliver the 3n1 to room prior to discharge.  Pt has rolling walker in room with her.  No other CM needs were communicated. Dellie Catholic, RN 08/18/2015, 2:14 PM

## 2015-08-19 LAB — BASIC METABOLIC PANEL
ANION GAP: 4 — AB (ref 5–15)
BUN: 16 mg/dL (ref 6–20)
CALCIUM: 8.9 mg/dL (ref 8.9–10.3)
CO2: 28 mmol/L (ref 22–32)
Chloride: 107 mmol/L (ref 101–111)
Creatinine, Ser: 0.73 mg/dL (ref 0.44–1.00)
Glucose, Bld: 105 mg/dL — ABNORMAL HIGH (ref 65–99)
POTASSIUM: 3.5 mmol/L (ref 3.5–5.1)
SODIUM: 139 mmol/L (ref 135–145)

## 2015-08-19 LAB — CBC
HEMATOCRIT: 33.1 % — AB (ref 36.0–46.0)
Hemoglobin: 11.4 g/dL — ABNORMAL LOW (ref 12.0–15.0)
MCH: 31.4 pg (ref 26.0–34.0)
MCHC: 34.4 g/dL (ref 30.0–36.0)
MCV: 91.2 fL (ref 78.0–100.0)
PLATELETS: 187 10*3/uL (ref 150–400)
RBC: 3.63 MIL/uL — ABNORMAL LOW (ref 3.87–5.11)
RDW: 13.1 % (ref 11.5–15.5)
WBC: 8.4 10*3/uL (ref 4.0–10.5)

## 2015-08-19 MED ORDER — OXYCODONE HCL 5 MG PO TABS: 5.0000 mg | ORAL_TABLET | ORAL | Status: DC | PRN

## 2015-08-19 MED ORDER — OXYCODONE HCL 5 MG PO TABS
5.0000 mg | ORAL_TABLET | ORAL | Status: DC | PRN
  Administered 2015-08-19 (×3): 15 mg via ORAL
  Filled 2015-08-19 (×3): qty 3

## 2015-08-19 NOTE — Progress Notes (Signed)
Physical Therapy Treatment Patient Details Name: Tracy [name] MRN: [id number] DOB: 27-Sep-1950 Today's Date: 08/19/2015    History of Present Illness s/p RTKA , with a left bursectomy and repair of gluteal tendon about 1 year ago. Pt states she has always had a trendelenburg gait on the L side with her L hip.     PT Comments    The patient  And spouse practiced 4 steps in order to get to second level at home. Patient did well. Ready for DC.  Follow Up Recommendations  Home health PT     Equipment Recommendations  None recommended by PT    Recommendations for Other Services       Precautions / Restrictions Precautions Precautions: Knee Precaution Comments: instructed on KI use for amb and stairs Required Braces or Orthoses: Knee Immobilizer - Right Knee Immobilizer - Right: Discontinue once straight leg raise with < 10 degree lag Restrictions Weight Bearing Restrictions: Yes RLE Weight Bearing: Weight bearing as tolerated    Mobility  Bed Mobility Overal bed mobility: Needs Assistance Bed Mobility: Supine to Sit     Supine to sit: Min guard     General bed mobility comments: assist for RLE to eob and back to bed.  Had spouse assist with pt under therapist direction  Transfers Overall transfer level: Needs assistance Equipment used: Rolling walker (2 wheeled) Transfers: Sit to/from Stand Sit to Stand: Supervision;Min guard         General transfer comment: cues for safety,  Ambulation/Gait Ambulation/Gait assistance: Min guard Ambulation Distance (Feet): 32 Feet Assistive device: Rolling walker (2 wheeled) Gait Pattern/deviations: Step-to pattern;Step-through pattern;Trunk flexed Gait velocity: decreased   General Gait Details: cues for gait sequence with RW plus increased time   Stairs Stairs: Yes Stairs assistance: Min assist Stair Management: One rail Left;Step to pattern;Forwards;With crutches Number of Stairs: 4 General stair comments:  spouse present to assist. cues for safety  Wheelchair Mobility    Modified Rankin (Stroke Patients Only)       Balance                                    Cognition Arousal/Alertness: Awake/alert Behavior During Therapy: WFL for tasks assessed/performed Overall Cognitive Status: Within Functional Limits for tasks assessed                      Exercises      General Comments        Pertinent Vitals/Pain Pain Assessment: 0-10 Pain Score: 4  Pain Location: R knee Pain Descriptors / Indicators: Aching;Sore Pain Intervention(s): Monitored during session;Repositioned    Home Living                      Prior Function            PT Goals (current goals can now be found in the care plan section) Progress towards PT goals: Progressing toward goals    Frequency  7X/week    PT Plan Current plan remains appropriate    Co-evaluation             End of Session Equipment Utilized During Treatment: Gait belt Activity Tolerance: Patient tolerated treatment well Patient left: in chair;with call bell/phone within reach;with family/visitor present     Time: 1340-1406 PT Time Calculation (min) (ACUTE ONLY): 26 min  Charges:  $Gait Training: 8-22 mins  $Self Care/Home  Management: 8-22                    G Codes:      Tracy [name] 08/19/2015, 4:03 PM

## 2015-08-19 NOTE — Progress Notes (Signed)
Physical Therapy Treatment Patient Details Name: Tracy [name] MRN: [id number] DOB: 1950/03/14 Today's Date: 08/19/2015    History of Present Illness s/p RTKA , with a left bursectomy and repair of gluteal tendon about 1 year ago. 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     PT Comments    POPD # 2 am session Pt having issues with pain control.   Spouse present, so under therapist direction had him assist pt OOB, amb in hallway then practice one step twice.  Will need another session to address multiple steps and perform walking test..   Follow Up Recommendations  Home health PT     Equipment Recommendations  None recommended by PT    Recommendations for Other Services       Precautions / Restrictions Precautions Precautions: Knee Precaution Comments: instructed on KI use for amb and stairs Required Braces or Orthoses: Knee Immobilizer - Right Knee Immobilizer - Right: Discontinue once straight leg raise with < 10 degree lag Restrictions Weight Bearing Restrictions: Yes RLE Weight Bearing: Weight bearing as tolerated    Mobility  Bed Mobility Overal bed mobility: Needs Assistance Bed Mobility: Supine to Sit     Supine to sit: Min guard     General bed mobility comments: assist for RLE to eob and back to bed.  Had spouse assist with pt under therapist direction  Transfers Overall transfer level: Needs assistance Equipment used: Rolling walker (2 wheeled) Transfers: Sit to/from Stand Sit to Stand: Supervision;Min guard         General transfer comment: had spouse hands on assist under therapist direction  Ambulation/Gait Ambulation/Gait assistance: Min guard Ambulation Distance (Feet): 32 Feet Assistive device: Rolling walker (2 wheeled) Gait Pattern/deviations: Step-to pattern;Decreased stance time - right Gait velocity: decreased   General Gait Details: cues for gait sequence with RW plus increased  time   Stairs Stairs: Yes Stairs assistance: Min assist Stair Management: No rails;Alternating pattern;Forwards;With walker Number of Stairs: 1 General stair comments: 25% VC's on proper tech and performed wiith spouse twice.   Wheelchair Mobility    Modified Rankin (Stroke Patients Only)       Balance                                    Cognition Arousal/Alertness: Awake/alert Behavior During Therapy: WFL for tasks assessed/performed Overall Cognitive Status: Within Functional Limits for tasks assessed                      Exercises      General Comments        Pertinent Vitals/Pain Pain Assessment: 0-10 Pain Score: 3  Pain Location: R knee Pain Descriptors / Indicators: Aching;Sore Pain Intervention(s): Monitored during session;Repositioned;Ice applied    Home Living                      Prior Function            PT Goals (current goals can now be found in the care plan section) Progress towards PT goals: Progressing toward goals    Frequency  7X/week    PT Plan Current plan remains appropriate    Co-evaluation             End of Session Equipment Utilized During Treatment: Gait belt Activity Tolerance: Patient limited by fatigue Patient  left: in chair;with call bell/phone within reach;with chair alarm set;with family/visitor present     Time: 1002-1030 PT Time Calculation (min) (ACUTE ONLY): 28 min  Charges:  $Gait Training: 8-22 mins $Therapeutic Activity: 8-22 mins                    G Codes:      Rica Koyanagi  PTA [facility]  Acute  Rehab Pager      [phone]

## 2015-08-19 NOTE — Progress Notes (Signed)
   Subjective: 2 Days Post-Op Procedure(s) (LRB): RIGHT TOTAL KNEE ARTHROPLASTY (Right) Patient reports pain as moderate.   Patient seen in rounds with Dr. Wynelle Link. Patient is having problems with pain in the right knee, requiring pain medications. She has felt limited with therapy as a result of her pain. Voiding well. No SOB or chest pain.  Plan is to go Home after hospital stay.  Objective: Vital signs in last 24 hours: Temp:  [97.8 F (36.6 C)-98.9 F (37.2 C)] 98.1 F (36.7 C) (06/21 0549) Pulse Rate:  [70-74] 74 (06/21 0549) Resp:  [16] 16 (06/21 0549) BP: (111-137)/(52-76) 111/52 mmHg (06/21 0549) SpO2:  [98 %-100 %] 100 % (06/20 2139)  Intake/Output from previous day:  Intake/Output Summary (Last 24 hours) at 08/19/15 0911 Last data filed at 08/19/15 0549  Gross per 24 hour  Intake 1207.5 ml  Output   2600 ml  Net -1392.5 ml     Labs:  Recent Labs  08/18/15 0428 08/19/15 0417  HGB 11.7* 11.4*    Recent Labs  08/18/15 0428 08/19/15 0417  WBC 8.9 8.4  RBC 3.72* 3.63*  HCT 33.7* 33.1*  PLT 194 187    Recent Labs  08/18/15 0428 08/19/15 0417  NA 143 139  K 4.1 3.5  CL 111 107  CO2 28 28  BUN 14 16  CREATININE 0.61 0.73  GLUCOSE 97 105*  CALCIUM 9.1 8.9    EXAM General - Patient is Alert and Oriented Extremity - Neurologically intact Intact pulses distally Dorsiflexion/Plantar flexion intact No cellulitis present Compartment soft Dressing/Incision - clean, dry, no drainage Motor Function - intact, moving foot and toes well on exam.   Past Medical History  Diagnosis Date  . Diverticulitis   . GERD (gastroesophageal reflux disease)   . Hyperlipidemia   . Complication of anesthesia   . PONV (postoperative nausea and vomiting)   . Bursitis of left hip   . Arthritis   . Osteopenia   . Rosacea   . Hx: UTI (urinary tract infection)   . Hyperthyroidism   . Anxiety   . Cancer (HCC)     basil cell    Assessment/Plan: 2 Days Post-Op  Procedure(s) (LRB): RIGHT TOTAL KNEE ARTHROPLASTY (Right) Principal Problem:   OA (osteoarthritis) of knee  Estimated body mass index is 30.02 kg/(m^2) as calculated from the following:   Height as of this encounter: 5\' 4"  (1.626 m).   Weight as of this encounter: 79.379 kg (175 lb). Advance diet Up with therapy Discharge home with home health  DVT Prophylaxis - Xarelto Weight-Bearing as tolerated  Will try patient on increased dosage of oxycodone. Will switch to dilaudid if pain still poorly controlled. No more IV pain meds. Will do two sessions of PT today. Plan for DC home.    Ardeen Jourdain, PA-C Orthopaedic Surgery 08/19/2015, 9:11 AM

## 2015-08-20 DIAGNOSIS — Z87440 Personal history of urinary (tract) infections: Secondary | ICD-10-CM | POA: Diagnosis not present

## 2015-08-20 DIAGNOSIS — M2031 Hallux varus (acquired), right foot: Secondary | ICD-10-CM | POA: Diagnosis not present

## 2015-08-20 DIAGNOSIS — Z7901 Long term (current) use of anticoagulants: Secondary | ICD-10-CM | POA: Diagnosis not present

## 2015-08-20 DIAGNOSIS — Z96651 Presence of right artificial knee joint: Secondary | ICD-10-CM | POA: Diagnosis not present

## 2015-08-20 DIAGNOSIS — M2041 Other hammer toe(s) (acquired), right foot: Secondary | ICD-10-CM | POA: Diagnosis not present

## 2015-08-20 DIAGNOSIS — Z471 Aftercare following joint replacement surgery: Secondary | ICD-10-CM | POA: Diagnosis not present

## 2015-08-20 DIAGNOSIS — M8580 Other specified disorders of bone density and structure, unspecified site: Secondary | ICD-10-CM | POA: Diagnosis not present

## 2015-08-20 DIAGNOSIS — Z792 Long term (current) use of antibiotics: Secondary | ICD-10-CM | POA: Diagnosis not present

## 2015-08-20 DIAGNOSIS — Z79891 Long term (current) use of opiate analgesic: Secondary | ICD-10-CM | POA: Diagnosis not present

## 2015-08-21 DIAGNOSIS — M2031 Hallux varus (acquired), right foot: Secondary | ICD-10-CM | POA: Diagnosis not present

## 2015-08-21 DIAGNOSIS — M2041 Other hammer toe(s) (acquired), right foot: Secondary | ICD-10-CM | POA: Diagnosis not present

## 2015-08-21 DIAGNOSIS — Z7901 Long term (current) use of anticoagulants: Secondary | ICD-10-CM | POA: Diagnosis not present

## 2015-08-21 DIAGNOSIS — Z87440 Personal history of urinary (tract) infections: Secondary | ICD-10-CM | POA: Diagnosis not present

## 2015-08-21 DIAGNOSIS — Z79891 Long term (current) use of opiate analgesic: Secondary | ICD-10-CM | POA: Diagnosis not present

## 2015-08-21 DIAGNOSIS — M8580 Other specified disorders of bone density and structure, unspecified site: Secondary | ICD-10-CM | POA: Diagnosis not present

## 2015-08-21 DIAGNOSIS — Z792 Long term (current) use of antibiotics: Secondary | ICD-10-CM | POA: Diagnosis not present

## 2015-08-21 DIAGNOSIS — Z471 Aftercare following joint replacement surgery: Secondary | ICD-10-CM | POA: Diagnosis not present

## 2015-08-21 DIAGNOSIS — Z96651 Presence of right artificial knee joint: Secondary | ICD-10-CM | POA: Diagnosis not present

## 2015-08-21 NOTE — Discharge Summary (Signed)
Physician Discharge Summary   Patient ID: Tracy [name] MRN: [id number] DOB/AGE: March 17, 1950 65 y.o.  Admit date: 08/17/2015 Discharge date: 08/19/2015  Primary Diagnosis: Primary osteoarthritis right knee  Admission Diagnoses:  Past Medical History  Diagnosis Date  . Diverticulitis   . GERD (gastroesophageal reflux disease)   . Hyperlipidemia   . Complication of anesthesia   . PONV (postoperative nausea and vomiting)   . Bursitis of left hip   . Arthritis   . Osteopenia   . Rosacea   . Hx: UTI (urinary tract infection)   . Hyperthyroidism   . Anxiety   . Cancer ([place])     basil cell   Discharge Diagnoses:   Principal Problem:   OA (osteoarthritis) of knee  Estimated body mass index is 30.02 kg/(m^2) as calculated from the following:   Height as of this encounter: 5' 4"  (1.626 m).   Weight as of this encounter: 79.379 kg (175 lb).  Procedure:  Procedure(s) (LRB): RIGHT TOTAL KNEE ARTHROPLASTY (Right)   Consults: None  HPI: The patient is being followed for their right knee pain and osteoarthritis. They are months out from Synvisc series (that did not provide any relief of symptoms). Symptoms reported include: pain (and aching), swelling and instability. The following medication has been used for pain control: antiinflammatory medication (Ibuprofen). States due to favoring going up and down steps she has had the onset of pain in the right shoulder over the past 2 months Unfortunately the viscosupplement injections did not help at all. She has documented advanced arthritis in the right knee, bone on bone changes. She has had cortisone and viscosupplement injections without any benefit. Knee is hurting at all times. It is limiting what she can and cannot do. She is also developing some right shoulder pain from having to use her arms to push up, to get her walking. She has got advanced arthritic change with no response to cortisone and viscosupplements. She has tried some home  exercise without benefit. At this point, the most predictive means of improving her pain and function is going to be total knee arthroplasty. Discussed in detail today and she would like to proceed. At this point, the most predictable means of improving pain and function is total knee arthroplasty. The procedure, risks, potential complications and rehab course are discussed in detail and the patient elects to proceed. The goals of this procedure are decreased pain and increased function. There is a high liklihood that both of these goals will be achieved. They have been treated conservatively in the past for the above stated problem and despite conservative measures, they continue to have progressive pain and severe functional limitations and dysfunction. They have failed non-operative management including home exercise, medications, and injections. It is felt that they would benefit from undergoing total joint replacement. Risks and benefits of the procedure have been discussed with the patient and they elect to proceed with surgery. There are no active contraindications to surgery such as ongoing infection or rapidly progressive neurological disease.  Laboratory Data: Admission on 08/17/2015, Discharged on 08/19/2015  Component Date Value Ref Range Status  . WBC 08/18/2015 8.9  4.0 - 10.5 K/uL Final  . RBC 08/18/2015 3.72* 3.87 - 5.11 MIL/uL Final  . Hemoglobin 08/18/2015 11.7* 12.0 - 15.0 g/dL Final  . HCT 08/18/2015 33.7* 36.0 - 46.0 % Final  . MCV 08/18/2015 90.6  78.0 - 100.0 fL Final  . MCH 08/18/2015 31.5  26.0 - 34.0 pg Final  . MCHC 08/18/2015  34.7  30.0 - 36.0 g/dL Final  . RDW 08/18/2015 12.8  11.5 - 15.5 % Final  . Platelets 08/18/2015 194  150 - 400 K/uL Final  . Sodium 08/18/2015 143  135 - 145 mmol/L Final  . Potassium 08/18/2015 4.1  3.5 - 5.1 mmol/L Final  . Chloride 08/18/2015 111  101 - 111 mmol/L Final  . CO2 08/18/2015 28  22 - 32 mmol/L Final  . Glucose, Bld 08/18/2015 97   65 - 99 mg/dL Final  . BUN 08/18/2015 14  6 - 20 mg/dL Final  . Creatinine, Ser 08/18/2015 0.61  0.44 - 1.00 mg/dL Final  . Calcium 08/18/2015 9.1  8.9 - 10.3 mg/dL Final  . GFR calc non Af Amer 08/18/2015 >60  >60 mL/min Final  . GFR calc Af Amer 08/18/2015 >60  >60 mL/min Final   Comment: (NOTE) The eGFR has been calculated using the CKD EPI equation. This calculation has not been validated in all clinical situations. eGFR's persistently <60 mL/min signify possible Chronic Kidney Disease.   . Anion gap 08/18/2015 4* 5 - 15 Final  . WBC 08/19/2015 8.4  4.0 - 10.5 K/uL Final  . RBC 08/19/2015 3.63* 3.87 - 5.11 MIL/uL Final  . Hemoglobin 08/19/2015 11.4* 12.0 - 15.0 g/dL Final  . HCT 08/19/2015 33.1* 36.0 - 46.0 % Final  . MCV 08/19/2015 91.2  78.0 - 100.0 fL Final  . MCH 08/19/2015 31.4  26.0 - 34.0 pg Final  . MCHC 08/19/2015 34.4  30.0 - 36.0 g/dL Final  . RDW 08/19/2015 13.1  11.5 - 15.5 % Final  . Platelets 08/19/2015 187  150 - 400 K/uL Final  . Sodium 08/19/2015 139  135 - 145 mmol/L Final  . Potassium 08/19/2015 3.5  3.5 - 5.1 mmol/L Final  . Chloride 08/19/2015 107  101 - 111 mmol/L Final  . CO2 08/19/2015 28  22 - 32 mmol/L Final  . Glucose, Bld 08/19/2015 105* 65 - 99 mg/dL Final  . BUN 08/19/2015 16  6 - 20 mg/dL Final  . Creatinine, Ser 08/19/2015 0.73  0.44 - 1.00 mg/dL Final  . Calcium 08/19/2015 8.9  8.9 - 10.3 mg/dL Final  . GFR calc non Af Amer 08/19/2015 >60  >60 mL/min Final  . GFR calc Af Amer 08/19/2015 >60  >60 mL/min Final   Comment: (NOTE) The eGFR has been calculated using the CKD EPI equation. This calculation has not been validated in all clinical situations. eGFR's persistently <60 mL/min signify possible Chronic Kidney Disease.   Georgiann Hahn gap 08/19/2015 4* 5 - 15 Final  Hospital Outpatient Visit on 08/10/2015  Component Date Value Ref Range Status  . MRSA, PCR 08/10/2015 NEGATIVE  NEGATIVE Final  . Staphylococcus aureus 08/10/2015 NEGATIVE   NEGATIVE Final   Comment:        The Xpert SA Assay (FDA approved for NASAL specimens in patients over 64 years of age), is one component of a comprehensive surveillance program.  Test performance has been validated by [facility] for patients greater than or equal to 75 year old. It is not intended to diagnose infection nor to guide or monitor treatment.   Marland Kitchen aPTT 08/10/2015 25  24 - 37 seconds Final  . WBC 08/10/2015 5.6  4.0 - 10.5 K/uL Final  . RBC 08/10/2015 4.36  3.87 - 5.11 MIL/uL Final  . Hemoglobin 08/10/2015 13.7  12.0 - 15.0 g/dL Final  . HCT 08/10/2015 39.2  36.0 - 46.0 % Final  . MCV  08/10/2015 89.9  78.0 - 100.0 fL Final  . MCH 08/10/2015 31.4  26.0 - 34.0 pg Final  . MCHC 08/10/2015 34.9  30.0 - 36.0 g/dL Final  . RDW 08/10/2015 12.8  11.5 - 15.5 % Final  . Platelets 08/10/2015 230  150 - 400 K/uL Final  . Sodium 08/10/2015 140  135 - 145 mmol/L Final  . Potassium 08/10/2015 4.8  3.5 - 5.1 mmol/L Final  . Chloride 08/10/2015 107  101 - 111 mmol/L Final  . CO2 08/10/2015 28  22 - 32 mmol/L Final  . Glucose, Bld 08/10/2015 120* 65 - 99 mg/dL Final  . BUN 08/10/2015 20  6 - 20 mg/dL Final  . Creatinine, Ser 08/10/2015 0.83  0.44 - 1.00 mg/dL Final  . Calcium 08/10/2015 10.0  8.9 - 10.3 mg/dL Final  . Total Protein 08/10/2015 7.2  6.5 - 8.1 g/dL Final  . Albumin 08/10/2015 4.6  3.5 - 5.0 g/dL Final  . AST 08/10/2015 29  15 - 41 U/L Final  . ALT 08/10/2015 25  14 - 54 U/L Final  . Alkaline Phosphatase 08/10/2015 73  38 - 126 U/L Final  . Total Bilirubin 08/10/2015 0.6  0.3 - 1.2 mg/dL Final  . GFR calc non Af Amer 08/10/2015 >60  >60 mL/min Final  . GFR calc Af Amer 08/10/2015 >60  >60 mL/min Final   Comment: (NOTE) The eGFR has been calculated using the CKD EPI equation. This calculation has not been validated in all clinical situations. eGFR's persistently <60 mL/min signify possible Chronic Kidney Disease.   . Anion gap 08/10/2015 5  5 - 15 Final  .  Prothrombin Time 08/10/2015 13.3  11.6 - 15.2 seconds Final  . INR 08/10/2015 0.99  0.00 - 1.49 Final  . ABO/RH(D) 08/10/2015 A POS   Final  . Antibody Screen 08/10/2015 NEG   Final  . Sample Expiration 08/10/2015 08/20/2015   Final  . Extend sample reason 08/10/2015 NO TRANSFUSIONS OR PREGNANCY IN THE PAST 3 MONTHS   Final  . Color, Urine 08/10/2015 YELLOW  YELLOW Final  . APPearance 08/10/2015 CLEAR  CLEAR Final  . Specific Gravity, Urine 08/10/2015 1.025  1.005 - 1.030 Final  . pH 08/10/2015 6.0  5.0 - 8.0 Final  . Glucose, UA 08/10/2015 NEGATIVE  NEGATIVE mg/dL Final  . Hgb urine dipstick 08/10/2015 NEGATIVE  NEGATIVE Final  . Bilirubin Urine 08/10/2015 NEGATIVE  NEGATIVE Final  . Ketones, ur 08/10/2015 NEGATIVE  NEGATIVE mg/dL Final  . Protein, ur 08/10/2015 NEGATIVE  NEGATIVE mg/dL Final  . Nitrite 08/10/2015 NEGATIVE  NEGATIVE Final  . Leukocytes, UA 08/10/2015 NEGATIVE  NEGATIVE Final   MICROSCOPIC NOT DONE ON URINES WITH NEGATIVE PROTEIN, BLOOD, LEUKOCYTES, NITRITE, OR GLUCOSE <1000 mg/dL.  . ABO/RH(D) 08/10/2015 A POS   Final  Office Visit on 07/01/2015  Component Date Value Ref Range Status  . Color, UA 07/01/2015 dk yellow   Final  . Clarity, UA 07/01/2015 clear   Final  . Glucose, UA 07/01/2015 negative   Final  . Bilirubin, UA 07/01/2015 negative   Final  . Ketones, UA 07/01/2015 negative   Final  . Spec Grav, UA 07/01/2015 >=1.030   Final  . Blood, UA 07/01/2015 negative   Final  . pH, UA 07/01/2015 5.0   Final  . Protein, UA 07/01/2015 negative   Final  . Urobilinogen, UA 07/01/2015 0.2   Final  . Nitrite, UA 07/01/2015 negative   Final  . Leukocytes, UA 07/01/2015 Negative  Negative  Final     X-Rays:No results found.  EKG: Orders placed or performed in visit on 06/05/15  . EKG 12-Lead     Hospital Course: BRIANTE LOVEALL is a 65 y.o. who was admitted to [facility]. They were brought to the operating room on 08/17/2015 and underwent  Procedure(s): RIGHT TOTAL KNEE ARTHROPLASTY.  Patient tolerated the procedure well and was later transferred to the recovery room and then to the orthopaedic floor for postoperative care.  They were given PO and IV analgesics for pain control following their surgery.  They were given 24 hours of postoperative antibiotics of  Anti-infectives    Start     Dose/Rate Route Frequency Ordered Stop   08/17/15 1400  ceFAZolin (ANCEF) IVPB 2g/100 mL premix     2 g 200 mL/hr over 30 Minutes Intravenous Every 6 hours 08/17/15 1104 08/17/15 1951   08/17/15 0619  ceFAZolin (ANCEF) IVPB 2g/100 mL premix     2 g 200 mL/hr over 30 Minutes Intravenous On call to O.R. 08/17/15 [id number] 08/17/15 0758     and started on DVT prophylaxis in the form of Xarelto.   PT and OT were ordered for total joint protocol.  Discharge planning consulted to help with postop disposition and equipment needs.  Patient had a difficult night on the evening of surgery. She had some issues with pain control as well as nausea. They started to get up OOB with therapy on day one. Hemovac drain was pulled without difficulty.  Continued to work with therapy into day two.  Dressing was changed on day two and the incision was clean and dry.  The patient had progressed with therapy and meeting their goals.  Incision was healing well.  Patient was seen in rounds and was ready to go home.   Diet: Regular diet Activity:WBAT Follow-up:in 2 weeks Disposition - Home Discharged Condition: stable   Discharge Instructions    Call MD / Call 911    Complete by:  As directed   If you experience chest pain or shortness of breath, CALL 911 and be transported to the hospital emergency room.  If you develope a fever above 101 F, pus (white drainage) or increased drainage or redness at the wound, or calf pain, call your surgeon's office.     Constipation Prevention    Complete by:  As directed   Drink plenty of fluids.  Prune juice may be helpful.  You may use  a stool softener, such as Colace (over the counter) 100 mg twice a day.  Use MiraLax (over the counter) for constipation as needed.     Diet general    Complete by:  As directed      Discharge instructions    Complete by:  As directed   Dr. Gaynelle Arabian Total Joint Specialist [facility] [address] [place], [place] [address] [phone]  TOTAL KNEE REPLACEMENT POSTOPERATIVE DIRECTIONS  Knee Rehabilitation, Guidelines Following Surgery  Results after knee surgery are often greatly improved when you follow the exercise, range of motion and muscle strengthening exercises prescribed by your doctor. Safety measures are also important to protect the knee from further injury. Any time any of these exercises cause you to have increased pain or swelling in your knee joint, decrease the amount until you are comfortable again and slowly increase them. If you have problems or questions, call your caregiver or physical therapist for advice.   HOME CARE INSTRUCTIONS  Remove items at home which  could result in a fall. This includes throw rugs or furniture in walking pathways.  ICE to the affected knee every three hours for 30 minutes at a time and then as needed for pain and swelling.  Continue to use ice on the knee for pain and swelling from surgery. You may notice swelling that will progress down to the foot and ankle.  This is normal after surgery.  Elevate the leg when you are not up walking on it.   Continue to use the breathing machine which will help keep your temperature down.  It is common for your temperature to cycle up and down following surgery, especially at night when you are not up moving around and exerting yourself.  The breathing machine keeps your lungs expanded and your temperature down. Do not place pillow under knee, focus on keeping the knee straight while resting  DIET You may resume your previous home diet once your are discharged from the  hospital.  DRESSING / WOUND CARE / SHOWERING You may start showering once you are discharged home but do not submerge the incision under water. Just pat the incision dry and apply a dry gauze dressing on daily. Change the surgical dressing daily and reapply a dry dressing each time.  ACTIVITY Walk with your walker as instructed. Use walker as long as suggested by your caregivers. Avoid periods of inactivity such as sitting longer than an hour when not asleep. This helps prevent blood clots.  You may resume a sexual relationship in one month or when given the OK by your doctor.  You may return to work once you are cleared by your doctor.  Do not drive a car for 6 weeks or until released by you surgeon.  Do not drive while taking narcotics.  WEIGHT BEARING Weight bearing as tolerated with assist device (walker, cane, etc) as directed, use it as long as suggested by your surgeon or therapist, typically at least 4-6 weeks.  POSTOPERATIVE CONSTIPATION PROTOCOL Constipation - defined medically as fewer than three stools per week and severe constipation as less than one stool per week.  One of the most common issues patients have following surgery is constipation.  Even if you have a regular bowel pattern at home, your normal regimen is likely to be disrupted due to multiple reasons following surgery.  Combination of anesthesia, postoperative narcotics, change in appetite and fluid intake all can affect your bowels.  In order to avoid complications following surgery, here are some recommendations in order to help you during your recovery period.  Colace (docusate) - Pick up an over-the-counter form of Colace or another stool softener and take twice a day as long as you are requiring postoperative pain medications.  Take with a full glass of water daily.  If you experience loose stools or diarrhea, hold the colace until you stool forms back up.  If your symptoms do not get better within 1 week or if  they get worse, check with your doctor.  Dulcolax (bisacodyl) - Pick up over-the-counter and take as directed by the product packaging as needed to assist with the movement of your bowels.  Take with a full glass of water.  Use this product as needed if not relieved by Colace only.   MiraLax (polyethylene glycol) - Pick up over-the-counter to have on hand.  MiraLax is a solution that will increase the amount of water in your bowels to assist with bowel movements.  Take as directed and can mix with a  glass of water, juice, soda, coffee, or tea.  Take if you go more than two days without a movement. Do not use MiraLax more than once per day. Call your doctor if you are still constipated or irregular after using this medication for 7 days in a row.  If you continue to have problems with postoperative constipation, please contact the office for further assistance and recommendations.  If you experience "the worst abdominal pain ever" or develop nausea or vomiting, please contact the office immediatly for further recommendations for treatment.  ITCHING  If you experience itching with your medications, try taking only a single pain pill, or even half a pain pill at a time.  You can also use Benadryl over the counter for itching or also to help with sleep.   TED HOSE STOCKINGS Wear the elastic stockings on both legs for three weeks following surgery during the day but you may remove then at night for sleeping.  MEDICATIONS See your medication summary on the "After Visit Summary" that the nursing staff will review with you prior to discharge.  You may have some home medications which will be placed on hold until you complete the course of blood thinner medication.  It is important for you to complete the blood thinner medication as prescribed by your surgeon.  Continue your approved medications as instructed at time of discharge.  PRECAUTIONS If you experience chest pain or shortness of breath - call 911  immediately for transfer to the hospital emergency department.  If you develop a fever greater that 101 F, purulent drainage from wound, increased redness or drainage from wound, foul odor from the wound/dressing, or calf pain - CONTACT YOUR SURGEON.                                                   FOLLOW-UP APPOINTMENTS Make sure you keep all of your appointments after your operation with your surgeon and caregivers. You should call the office at the above phone number and make an appointment for approximately two weeks after the date of your surgery or on the date instructed by your surgeon outlined in the "After Visit Summary".   RANGE OF MOTION AND STRENGTHENING EXERCISES  Rehabilitation of the knee is important following a knee injury or an operation. After just a few days of immobilization, the muscles of the thigh which control the knee become weakened and shrink (atrophy). Knee exercises are designed to build up the tone and strength of the thigh muscles and to improve knee motion. Often times heat used for twenty to thirty minutes before working out will loosen up your tissues and help with improving the range of motion but do not use heat for the first two weeks following surgery. These exercises can be done on a training (exercise) mat, on the floor, on a table or on a bed. Use what ever works the best and is most comfortable for you Knee exercises include:  Leg Lifts - While your knee is still immobilized in a splint or cast, you can do straight leg raises. Lift the leg to 60 degrees, hold for 3 sec, and slowly lower the leg. Repeat 10-20 times 2-3 times daily. Perform this exercise against resistance later as your knee gets better.  Quad and Hamstring Sets - Tighten up the muscle on the front of the  thigh (Quad) and hold for 5-10 sec. Repeat this 10-20 times hourly. Hamstring sets are done by pushing the foot backward against an object and holding for 5-10 sec. Repeat as with quad sets.  Leg  Slides: Lying on your back, slowly slide your foot toward your buttocks, bending your knee up off the floor (only go as far as is comfortable). Then slowly slide your foot back down until your leg is flat on the floor again. Angel Wings: Lying on your back spread your legs to the side as far apart as you can without causing discomfort.  A rehabilitation program following serious knee injuries can speed recovery and prevent re-injury in the future due to weakened muscles. Contact your doctor or a physical therapist for more information on knee rehabilitation.   IF YOU ARE TRANSFERRED TO A SKILLED REHAB FACILITY If the patient is transferred to a skilled rehab facility following release from the hospital, a list of the current medications will be sent to the facility for the patient to continue.  When discharged from the skilled rehab facility, please have the facility set up the patient's [place] prior to being released. Also, the skilled facility will be responsible for providing the patient with their medications at time of release from the facility to include their pain medication, the muscle relaxants, and their blood thinner medication. If the patient is still at the rehab facility at time of the two week follow up appointment, the skilled rehab facility will also need to assist the patient in arranging follow up appointment in our office and any transportation needs.  MAKE SURE YOU:  Understand these instructions.  Get help right away if you are not doing well or get worse.    Pick up stool softner and laxative for home use following surgery while on pain medications. Do not submerge incision under water. Please use good hand washing techniques while changing dressing each day. May shower starting three days after surgery. Please use a clean towel to pat the incision dry following showers. Continue to use ice for pain and swelling after surgery. Do not use any lotions or  creams on the incision until instructed by your surgeon.     Increase activity slowly as tolerated    Complete by:  As directed             Medication List    STOP taking these medications        B COMPLEX PO     CALCIUM 600 PO     fluticasone 50 MCG/ACT nasal spray  Commonly known as:  FLONASE     Melatonin 5 MG Tabs     multivitamin capsule     SM CRANBERRY 300 MG tablet  Generic drug:  Cranberry     Vitamin D3 2000 units Tabs      TAKE these medications        conjugated estrogens vaginal cream  Commonly known as:  PREMARIN  Apply vaginally 2-3 times weekly as needed for vaginal irritation.     diphenhydrAMINE 25 MG tablet  Commonly known as:  BENADRYL  Take 25 mg by mouth at bedtime.     doxycycline 100 MG tablet  Commonly known as:  VIBRA-TABS  Take 1 tablet (100 mg total) by mouth 2 (two) times daily.     fenofibrate 160 MG tablet  Take 1 tablet (160 mg total) by mouth daily.     methocarbamol 500 MG tablet  Commonly known as:  ROBAXIN  Take 1 tablet (500 mg total) by mouth every 6 (six) hours as needed for muscle spasms.     metroNIDAZOLE 0.75 % gel  Commonly known as:  METROGEL  Apply 1 application topically 2 (two) times daily as needed (For rosacea.).     omeprazole 40 MG capsule  Commonly known as:  PRILOSEC  Take 1 capsule (40 mg total) by mouth daily.     OVER THE COUNTER MEDICATION  Take 15 mLs by mouth daily. Metamucil Powder     oxyCODONE 5 MG immediate release tablet  Commonly known as:  Oxy IR/ROXICODONE  Take 1-3 tablets (5-15 mg total) by mouth every 3 (three) hours as needed for breakthrough pain.     rivaroxaban 10 MG Tabs tablet  Commonly known as:  XARELTO  Take 1 tablet (10 mg total) by mouth daily with breakfast.     traMADol 50 MG tablet  Commonly known as:  ULTRAM  Take 1-2 tablets (50-100 mg total) by mouth every 6 (six) hours as needed for moderate pain.           Follow-up Information    Follow up with  Gearlean Alf, MD. Schedule an appointment as soon as possible for a visit on 09/04/2015.   Specialty:  Orthopedic Surgery   Contact information:   [address] [place] [address] [phone]       Follow up with [facility].   Why:  home health physical therapy   Contact information:   [place] [address] [phone]       Follow up with [facility].   Why:  3n1 (bedside commode)   Contact information:   [address] [place] [address] [phone]       Signed: Ardeen Jourdain, PA-C Orthopaedic Surgery 08/21/2015, 8:35 AM

## 2015-08-24 DIAGNOSIS — Z96651 Presence of right artificial knee joint: Secondary | ICD-10-CM | POA: Diagnosis not present

## 2015-08-24 DIAGNOSIS — M2031 Hallux varus (acquired), right foot: Secondary | ICD-10-CM | POA: Diagnosis not present

## 2015-08-24 DIAGNOSIS — Z79891 Long term (current) use of opiate analgesic: Secondary | ICD-10-CM | POA: Diagnosis not present

## 2015-08-24 DIAGNOSIS — Z471 Aftercare following joint replacement surgery: Secondary | ICD-10-CM | POA: Diagnosis not present

## 2015-08-24 DIAGNOSIS — M8580 Other specified disorders of bone density and structure, unspecified site: Secondary | ICD-10-CM | POA: Diagnosis not present

## 2015-08-24 DIAGNOSIS — M2041 Other hammer toe(s) (acquired), right foot: Secondary | ICD-10-CM | POA: Diagnosis not present

## 2015-08-24 DIAGNOSIS — Z7901 Long term (current) use of anticoagulants: Secondary | ICD-10-CM | POA: Diagnosis not present

## 2015-08-24 DIAGNOSIS — Z792 Long term (current) use of antibiotics: Secondary | ICD-10-CM | POA: Diagnosis not present

## 2015-08-24 DIAGNOSIS — Z87440 Personal history of urinary (tract) infections: Secondary | ICD-10-CM | POA: Diagnosis not present

## 2015-08-26 DIAGNOSIS — M2041 Other hammer toe(s) (acquired), right foot: Secondary | ICD-10-CM | POA: Diagnosis not present

## 2015-08-26 DIAGNOSIS — M8580 Other specified disorders of bone density and structure, unspecified site: Secondary | ICD-10-CM | POA: Diagnosis not present

## 2015-08-26 DIAGNOSIS — Z96651 Presence of right artificial knee joint: Secondary | ICD-10-CM | POA: Diagnosis not present

## 2015-08-26 DIAGNOSIS — Z792 Long term (current) use of antibiotics: Secondary | ICD-10-CM | POA: Diagnosis not present

## 2015-08-26 DIAGNOSIS — Z87440 Personal history of urinary (tract) infections: Secondary | ICD-10-CM | POA: Diagnosis not present

## 2015-08-26 DIAGNOSIS — Z7901 Long term (current) use of anticoagulants: Secondary | ICD-10-CM | POA: Diagnosis not present

## 2015-08-26 DIAGNOSIS — Z471 Aftercare following joint replacement surgery: Secondary | ICD-10-CM | POA: Diagnosis not present

## 2015-08-26 DIAGNOSIS — M2031 Hallux varus (acquired), right foot: Secondary | ICD-10-CM | POA: Diagnosis not present

## 2015-08-26 DIAGNOSIS — Z79891 Long term (current) use of opiate analgesic: Secondary | ICD-10-CM | POA: Diagnosis not present

## 2015-08-28 DIAGNOSIS — M2041 Other hammer toe(s) (acquired), right foot: Secondary | ICD-10-CM | POA: Diagnosis not present

## 2015-08-28 DIAGNOSIS — Z7901 Long term (current) use of anticoagulants: Secondary | ICD-10-CM | POA: Diagnosis not present

## 2015-08-28 DIAGNOSIS — Z471 Aftercare following joint replacement surgery: Secondary | ICD-10-CM | POA: Diagnosis not present

## 2015-08-28 DIAGNOSIS — Z79891 Long term (current) use of opiate analgesic: Secondary | ICD-10-CM | POA: Diagnosis not present

## 2015-08-28 DIAGNOSIS — M2031 Hallux varus (acquired), right foot: Secondary | ICD-10-CM | POA: Diagnosis not present

## 2015-08-28 DIAGNOSIS — Z87440 Personal history of urinary (tract) infections: Secondary | ICD-10-CM | POA: Diagnosis not present

## 2015-08-28 DIAGNOSIS — Z96651 Presence of right artificial knee joint: Secondary | ICD-10-CM | POA: Diagnosis not present

## 2015-08-28 DIAGNOSIS — Z792 Long term (current) use of antibiotics: Secondary | ICD-10-CM | POA: Diagnosis not present

## 2015-08-28 DIAGNOSIS — M8580 Other specified disorders of bone density and structure, unspecified site: Secondary | ICD-10-CM | POA: Diagnosis not present

## 2015-08-31 DIAGNOSIS — M2031 Hallux varus (acquired), right foot: Secondary | ICD-10-CM | POA: Diagnosis not present

## 2015-08-31 DIAGNOSIS — Z87440 Personal history of urinary (tract) infections: Secondary | ICD-10-CM | POA: Diagnosis not present

## 2015-08-31 DIAGNOSIS — Z471 Aftercare following joint replacement surgery: Secondary | ICD-10-CM | POA: Diagnosis not present

## 2015-08-31 DIAGNOSIS — Z79891 Long term (current) use of opiate analgesic: Secondary | ICD-10-CM | POA: Diagnosis not present

## 2015-08-31 DIAGNOSIS — M2041 Other hammer toe(s) (acquired), right foot: Secondary | ICD-10-CM | POA: Diagnosis not present

## 2015-08-31 DIAGNOSIS — M8580 Other specified disorders of bone density and structure, unspecified site: Secondary | ICD-10-CM | POA: Diagnosis not present

## 2015-08-31 DIAGNOSIS — Z7901 Long term (current) use of anticoagulants: Secondary | ICD-10-CM | POA: Diagnosis not present

## 2015-08-31 DIAGNOSIS — Z792 Long term (current) use of antibiotics: Secondary | ICD-10-CM | POA: Diagnosis not present

## 2015-08-31 DIAGNOSIS — Z96651 Presence of right artificial knee joint: Secondary | ICD-10-CM | POA: Diagnosis not present

## 2015-09-02 DIAGNOSIS — Z471 Aftercare following joint replacement surgery: Secondary | ICD-10-CM | POA: Diagnosis not present

## 2015-09-02 DIAGNOSIS — M2031 Hallux varus (acquired), right foot: Secondary | ICD-10-CM | POA: Diagnosis not present

## 2015-09-02 DIAGNOSIS — Z96651 Presence of right artificial knee joint: Secondary | ICD-10-CM | POA: Diagnosis not present

## 2015-09-02 DIAGNOSIS — M8580 Other specified disorders of bone density and structure, unspecified site: Secondary | ICD-10-CM | POA: Diagnosis not present

## 2015-09-02 DIAGNOSIS — Z87440 Personal history of urinary (tract) infections: Secondary | ICD-10-CM | POA: Diagnosis not present

## 2015-09-02 DIAGNOSIS — Z7901 Long term (current) use of anticoagulants: Secondary | ICD-10-CM | POA: Diagnosis not present

## 2015-09-02 DIAGNOSIS — Z79891 Long term (current) use of opiate analgesic: Secondary | ICD-10-CM | POA: Diagnosis not present

## 2015-09-02 DIAGNOSIS — M2041 Other hammer toe(s) (acquired), right foot: Secondary | ICD-10-CM | POA: Diagnosis not present

## 2015-09-02 DIAGNOSIS — Z792 Long term (current) use of antibiotics: Secondary | ICD-10-CM | POA: Diagnosis not present

## 2015-09-03 DIAGNOSIS — Z79891 Long term (current) use of opiate analgesic: Secondary | ICD-10-CM | POA: Diagnosis not present

## 2015-09-03 DIAGNOSIS — Z87440 Personal history of urinary (tract) infections: Secondary | ICD-10-CM | POA: Diagnosis not present

## 2015-09-03 DIAGNOSIS — M2031 Hallux varus (acquired), right foot: Secondary | ICD-10-CM | POA: Diagnosis not present

## 2015-09-03 DIAGNOSIS — Z471 Aftercare following joint replacement surgery: Secondary | ICD-10-CM | POA: Diagnosis not present

## 2015-09-03 DIAGNOSIS — Z7901 Long term (current) use of anticoagulants: Secondary | ICD-10-CM | POA: Diagnosis not present

## 2015-09-03 DIAGNOSIS — M2041 Other hammer toe(s) (acquired), right foot: Secondary | ICD-10-CM | POA: Diagnosis not present

## 2015-09-03 DIAGNOSIS — M8580 Other specified disorders of bone density and structure, unspecified site: Secondary | ICD-10-CM | POA: Diagnosis not present

## 2015-09-03 DIAGNOSIS — Z96651 Presence of right artificial knee joint: Secondary | ICD-10-CM | POA: Diagnosis not present

## 2015-09-03 DIAGNOSIS — Z792 Long term (current) use of antibiotics: Secondary | ICD-10-CM | POA: Diagnosis not present

## 2015-09-07 DIAGNOSIS — M1711 Unilateral primary osteoarthritis, right knee: Secondary | ICD-10-CM | POA: Diagnosis not present

## 2015-09-09 DIAGNOSIS — M1711 Unilateral primary osteoarthritis, right knee: Secondary | ICD-10-CM | POA: Diagnosis not present

## 2015-09-10 ENCOUNTER — Ambulatory Visit: Payer: Medicare Other | Admitting: Family Medicine

## 2015-09-14 DIAGNOSIS — M1711 Unilateral primary osteoarthritis, right knee: Secondary | ICD-10-CM | POA: Diagnosis not present

## 2015-09-16 DIAGNOSIS — M1711 Unilateral primary osteoarthritis, right knee: Secondary | ICD-10-CM | POA: Diagnosis not present

## 2015-09-21 DIAGNOSIS — M1711 Unilateral primary osteoarthritis, right knee: Secondary | ICD-10-CM | POA: Diagnosis not present

## 2015-09-23 DIAGNOSIS — M1711 Unilateral primary osteoarthritis, right knee: Secondary | ICD-10-CM | POA: Diagnosis not present

## 2015-09-25 DIAGNOSIS — Z96651 Presence of right artificial knee joint: Secondary | ICD-10-CM | POA: Diagnosis not present

## 2015-09-25 DIAGNOSIS — Z471 Aftercare following joint replacement surgery: Secondary | ICD-10-CM | POA: Diagnosis not present

## 2015-09-28 DIAGNOSIS — M1711 Unilateral primary osteoarthritis, right knee: Secondary | ICD-10-CM | POA: Diagnosis not present

## 2015-09-30 DIAGNOSIS — M1711 Unilateral primary osteoarthritis, right knee: Secondary | ICD-10-CM | POA: Diagnosis not present

## 2015-10-05 DIAGNOSIS — M1711 Unilateral primary osteoarthritis, right knee: Secondary | ICD-10-CM | POA: Diagnosis not present

## 2015-10-08 DIAGNOSIS — M1711 Unilateral primary osteoarthritis, right knee: Secondary | ICD-10-CM | POA: Diagnosis not present

## 2015-10-27 ENCOUNTER — Encounter: Payer: Self-pay | Admitting: Family Medicine

## 2015-11-25 ENCOUNTER — Encounter: Payer: Self-pay | Admitting: Family Medicine

## 2015-11-25 MED ORDER — FENOFIBRATE 160 MG PO TABS: 160.0000 mg | ORAL_TABLET | Freq: Every day | ORAL | 1 refills | Status: DC

## 2015-11-25 MED ORDER — OMEPRAZOLE 40 MG PO CPDR: 40.0000 mg | DELAYED_RELEASE_CAPSULE | Freq: Every day | ORAL | 1 refills | Status: DC

## 2015-12-10 ENCOUNTER — Encounter: Payer: Self-pay | Admitting: Internal Medicine

## 2015-12-10 ENCOUNTER — Ambulatory Visit (INDEPENDENT_AMBULATORY_CARE_PROVIDER_SITE_OTHER): Payer: Medicare Other | Admitting: Internal Medicine

## 2015-12-10 VITALS — BP 104/62 | HR 67 | Wt 171.0 lb

## 2015-12-10 DIAGNOSIS — E041 Nontoxic single thyroid nodule: Secondary | ICD-10-CM | POA: Diagnosis not present

## 2015-12-10 DIAGNOSIS — E0590 Thyrotoxicosis, unspecified without thyrotoxic crisis or storm: Secondary | ICD-10-CM

## 2015-12-10 LAB — T3, FREE: T3 FREE: 3.1 pg/mL (ref 2.3–4.2)

## 2015-12-10 LAB — TSH: TSH: 0.23 u[IU]/mL — AB (ref 0.35–4.50)

## 2015-12-10 LAB — T4, FREE: Free T4: 1.02 ng/dL (ref 0.60–1.60)

## 2015-12-10 NOTE — Patient Instructions (Signed)
Please stop at the lab.  Please return in 1 year.  

## 2015-12-10 NOTE — Progress Notes (Signed)
Patient ID: Tracy [name], female   DOB: [birth year], 65 y.o.   MRN: [id number]   HPI  Tracy [name] is a 65 y.o.-year-old female, returning for f/u for subclinical hyperthyroidism and a cold thyroid nodule. Last visit 1 year ago.  Pt had R TKR this summer. She had a lot of pain after the surgery  - now better 3 mo out. Now walking 30 min a day.   Reviewed hx: She was dx'ed with hyperthyroidism in 2009 >> seen by Dr Theda Sers >> followed by labs, no tx needed.  I reviewed pt's thyroid tests: Lab Results  Component Value Date   TSH 0.37 03/13/2015   TSH 0.34 (L) 12/10/2014   TSH 0.32 (L) 09/08/2014   TSH 0.19 (L) 06/05/2014   TSH 0.19 (L) 02/12/2014   TSH 0.17 (L) 11/26/2013   TSH 0.16 (L) 07/16/2013   TSH 0.32 (L) 01/17/2013   TSH 4.16 03/08/2012   TSH 0.32 (L) 01/16/2012   FREET4 1.22 12/10/2014   FREET4 1.14 09/08/2014   FREET4 1.05 06/05/2014   FREET4 1.36 02/12/2014   FREET4 1.20 11/26/2013   FREET4 1.11 07/17/2013   FREET4 1.16 01/17/2013    Component     Latest Ref Rng 02/12/2014  TSI     <140 % baseline 22   We checked a thyroid uptake and scan (07/23/2014) >> uptake not elevated (7.9%), but a cold nodule noticed in the L lobe.  Thyroid U/S (07/28/2013): 21 x 15 x 26 mm L nodule  FNA nodule (08/07/2014): benign cytology  Pt denies feeling nodules in neck, hoarseness, dysphagia/odynophagia, SOB with lying down.   She c/o: - + fatigue - no weight gain/loss - No heat or cold intolerance - no tremors - + stable anxiety - + occasional palpitations - after drinking alcohol, so she stopped - hyperdefecation  She was on B complex >> stopped before the surgery.  She also has a history of OA - pain in shoulders, hip, knees; osteopenia; anxiety  ROS: Constitutional: see HPI, + nocturia Eyes: no blurry vision, no xerophthalmia ENT: no sore throat, no nodules palpated in throat, no dysphagia/odynophagia, no hoarseness;  Cardiovascular: no  CP/SOB/palpitations/no leg swelling Respiratory: no cough/SOB Gastrointestinal: no N/V/D/C/heartburn Musculoskeletal: + both: muscle/joint aches Skin: no rashes Neurological: no tremors/numbness/tingling/dizziness  I reviewed pt's medications, allergies, PMH, social hx, family hx, and changes were documented in the history of present illness. Otherwise, unchanged from my initial visit note.   Past Medical History:  Diagnosis Date  . Anxiety   . Arthritis   . Bursitis of left hip   . Cancer (HCC)    basil cell  . Complication of anesthesia   . Diverticulitis   . GERD (gastroesophageal reflux disease)   . Hx: UTI (urinary tract infection)   . Hyperlipidemia   . Hyperthyroidism   . Osteopenia   . PONV (postoperative nausea and vomiting)   . Rosacea    Past Surgical History:  Procedure Laterality Date  . APPENDECTOMY    . BACK SURGERY    . CHOLECYSTECTOMY    . FOOT SURGERY    . KNEE SURGERY    . LAPAROSCOPIC LIVER CYST FENESTRATION    . OPEN SURGICAL REPAIR OF GLUTEAL TENDON Left 04/01/2014   Procedure: LEFT HIP BURSECTOMY WITH GLUTEAL TENDON REPAIR;  Surgeon: Gearlean Alf, MD;  Location: [facility];  Service: Orthopedics;  Laterality: Left;  . TONSILLECTOMY    . TOTAL KNEE ARTHROPLASTY Right 08/17/2015   Procedure: RIGHT TOTAL KNEE  ARTHROPLASTY;  Surgeon: Gaynelle Arabian, MD;  Location: [facility];  Service: Orthopedics;  Laterality: Right;  . TUBAL LIGATION     History   Social History  . Marital Status: Married    Spouse Name: N/A    Number of Children: 2   Occupational History  . retired   Social History Main Topics  . Smoking status: Never Smoker   . Smokeless tobacco: Not on file  . Alcohol Use: vodka or wine 1-2x a week Yes  . Drug Use: No  Exercises by walking 5x a week.  Current Outpatient Prescriptions on File Prior to Visit  Medication Sig Dispense Refill  . conjugated estrogens (PREMARIN) vaginal cream Apply vaginally 2-3 times weekly as needed for vaginal  irritation. (Patient taking differently: Place 1 Applicatorful vaginally at bedtime as needed (For vaginal irritation.). ) 42.5 g 3  . doxycycline (VIBRA-TABS) 100 MG tablet Take 1 tablet (100 mg total) by mouth 2 (two) times daily. (Patient taking differently: Take 100 mg by mouth 2 (two) times daily as needed (For rosacea.). ) 20 tablet 0  . fenofibrate 160 MG tablet Take 1 tablet (160 mg total) by mouth daily. 90 tablet 1  . metroNIDAZOLE (METROGEL) 0.75 % gel Apply 1 application topically 2 (two) times daily as needed (For rosacea.).    Marland Kitchen omeprazole (PRILOSEC) 40 MG capsule Take 1 capsule (40 mg total) by mouth daily. 90 capsule 1  . OVER THE COUNTER MEDICATION Take 15 mLs by mouth daily. Metamucil Powder    . diphenhydrAMINE (BENADRYL) 25 MG tablet Take 25 mg by mouth at bedtime.    . methocarbamol (ROBAXIN) 500 MG tablet Take 1 tablet (500 mg total) by mouth every 6 (six) hours as needed for muscle spasms. (Patient not taking: Reported on 12/10/2015) 60 tablet 0  . oxyCODONE (OXY IR/ROXICODONE) 5 MG immediate release tablet Take 1-3 tablets (5-15 mg total) by mouth every 3 (three) hours as needed for breakthrough pain. (Patient not taking: Reported on 12/10/2015) 90 tablet 0  . rivaroxaban (XARELTO) 10 MG TABS tablet Take 1 tablet (10 mg total) by mouth daily with breakfast. (Patient not taking: Reported on 12/10/2015) 20 tablet 0  . traMADol (ULTRAM) 50 MG tablet Take 1-2 tablets (50-100 mg total) by mouth every 6 (six) hours as needed for moderate pain. (Patient not taking: Reported on 12/10/2015) 60 tablet 0   No current facility-administered medications on file prior to visit.    Allergies  Allergen Reactions  . Demerol [Meperidine] Nausea And Vomiting  . Latex Rash and Other (See Comments)    Fever blisters around mouth when dentist wore latex gloves   Family History  Problem Relation Age of Onset  . Hypertension Mother   . Stroke Mother   . Cancer Neg Hx   . Diabetes Neg Hx     PE: BP 104/62 (BP Location: Left Arm, Patient Position: Sitting)   Pulse 67   Wt 171 lb (77.6 kg)   SpO2 96%   BMI 29.35 kg/m  Body mass index is 29.35 kg/m.  Wt Readings from Last 3 Encounters:  12/10/15 171 lb (77.6 kg)  08/17/15 175 lb (79.4 kg)  08/10/15 175 lb (79.4 kg)   Constitutional: overweight, in NAD Eyes: PERRLA, EOMI ENT: moist mucous membranes, no thyromegaly, no cervical lymphadenopathy Cardiovascular: RRR, No MRG Respiratory: CTA B Gastrointestinal: abdomen soft, NT, ND, BS+ Musculoskeletal: no deformities, strength intact in all 4 Skin: moist, warm, no rashes Neurological: no tremor with outstretched hands, DTR normal  in all 4  ASSESSMENT: 1. Subclinical hyperthyroidism  2. Thyroid nodule, cold  CLINICAL DATA: Hyperthyroidism.  EXAM: THYROID SCAN AND UPTAKE - 24 HOURS  TECHNIQUE: Following the per oral administration of I-131 sodium iodide, the patient returned at 24 hours and uptake measurements were acquired with the uptake probe centered on the neck. Thyroid imaging was performed following the intravenous administration of the Tc-1m Pertechnetate.  RADIOPHARMACEUTICALS: 11.0 microCuries I-131 sodium iodide orally and 9.7 mCi Technetium-3m pertechnetate IV  COMPARISON: None  FINDINGS: The 24 hour radioactive iodine uptake is equal to 7.9%  There is faint radiotracer activity within both lobes of the thyroid gland. A cold defect is identified within the midpole of the left lobe.  IMPRESSION: 1. 24 hour radioactive iodine uptake is below the normal limits it 7.9%. 2. Focal cold defect within the mid left lobe of thyroid gland may represent a cyst or solid nodule. Correlation with thyroid sonogram.   Electronically Signed By: Kerby Moors M.D. On: 07/23/2014 14:51  Radioactive iodine uptake not elevated. Cold defect in the left lobe >> ordered a thyroid ultrasound to better characterize this lesion.  CLINICAL DATA:  65 year old female with sub clinical hyperthyroidism and cold defect on recent nuclear medicine uptake study.  EXAM: THYROID ULTRASOUND  TECHNIQUE: Ultrasound examination of the thyroid gland and adjacent soft tissues was performed.  COMPARISON: Nuclear medicine uptake study 07/23/2014  FINDINGS: Right thyroid lobe  Measurements: 6.1 x 1.4 x 1.6 cm. Heterogeneous thyroid parenchyma. No discrete nodule identified.  Left thyroid lobe  Measurements: 6.2 x 2.0 x 2.5 cm. Heterogeneous thyroid parenchyma. Solid hypoechoic nodule in the left midpole corresponding with the cold defect on the recent thyroid uptake study. This nodule measures 21 x 15 x 26 mm.  Isthmus  Thickness: 0.6 cm. No nodules visualized.  Lymphadenopathy  None visualized.  IMPRESSION: There is a 2.6 cm solid hypoechoic nodule in the left mid thyroid gland which corresponds to the cold nodule seen on recent nuclear medicine thyroid uptake study.  Findings meet consensus criteria for biopsy. Ultrasound-guided fine needle aspiration should be considered, as per the consensus statement: Management of Thyroid Nodules Detected at [name]: Society of Radiologists in [place]. Radiology 2005; [id number].   Electronically Signed By: Jacqulynn Cadet M.D. On: 07/29/2014 16:15  Large nodule in the left thyroid lobe >> suggested biopsy.   Adequacy Reason Satisfactory For Evaluation. Diagnosis THYROID, LEFT MID POLE, FINE NEEDLE ASPIRATION (SPECIMEN 1 OF 1 COLLECTED 08/06/2014): FINDINGS CONSISTENT WITH BENIGN THYROID NODULE ([place] CATEGORY II). Willeen Niece MD Pathologist, Electronic Signature (Case signed 08/07/2014) Specimen Clinical Information Solid hypoechoic in the left midpole corresponding with the cold defect on the recent thyroid uptake study. This nodule measures 21 x 15 x 26 mm Source Thyroid, Fine Needle Aspiration, Left Midpole, (Specimen 1 of 1,  collected  Benign pathology.   PLAN:  1. Patient with a history of mildly low TSH levels, with normal free T4 and free T3, with several possible thyrotoxic sxs: weight loss (resolved), occasional palpitations (resolved), fatigue (resolved). - At last check, in 03/2015 >> TSH normalized. - she does not appear to have exogenous causes for the low TSH. Not taking biotin. - The reason for her thyrotoxicosis are not very clear - her uptake was slightly low, at 7.9% (10-30%). This rules out Graves' disease and thyroiditis, but also toxic adenoma or toxic MNG... - We will check TSH, fT3 and fT4 today  - RTC in 1 year  2. Thyroid nodule - Patient had  a large cold thyroid nodule on her uptake and scan, which was biopsied in 07/2014 and the biopsy was benign. We reviewed the results of the ultrasound and the biopsy. - We'll continue to follow this nodule, but no intervention is needed for now. - will get a new U/S at next visit.  Component     Latest Ref Rng & Units 12/10/2015  TSH     0.35 - 4.50 uIU/mL 0.23 (L)  Triiodothyronine,Free,Serum     2.3 - 4.2 pg/mL 3.1  T4,Free(Direct)     0.60 - 1.60 ng/dL 1.02  Unfortunately, TSH is mildly low again. However, free T4 and free T3 are normal. I will continue to follow her TFTs, and will suggest repeat labs in 6 months and no other intervention since she is asymptomatic.  [name], MD PhD [facility] Endocrinology

## 2016-01-04 ENCOUNTER — Encounter: Payer: Self-pay | Admitting: Family Medicine

## 2016-01-04 ENCOUNTER — Ambulatory Visit (INDEPENDENT_AMBULATORY_CARE_PROVIDER_SITE_OTHER): Payer: Medicare Other | Admitting: Family Medicine

## 2016-01-04 VITALS — BP 106/73 | HR 76 | Temp 98.1°F | Resp 16 | Ht 64.0 in | Wt 171.2 lb

## 2016-01-04 DIAGNOSIS — R300 Dysuria: Secondary | ICD-10-CM | POA: Diagnosis not present

## 2016-01-04 DIAGNOSIS — R8290 Unspecified abnormal findings in urine: Secondary | ICD-10-CM

## 2016-01-04 LAB — POCT URINALYSIS DIPSTICK
BILIRUBIN UA: NEGATIVE
GLUCOSE UA: NEGATIVE
Leukocytes, UA: NEGATIVE
Nitrite, UA: NEGATIVE
PH UA: 6
RBC UA: NEGATIVE
Spec Grav, UA: >=1.03
Urobilinogen, UA: 0.2

## 2016-01-04 MED ORDER — CEPHALEXIN 500 MG PO CAPS: 500.0000 mg | ORAL_CAPSULE | Freq: Two times a day (BID) | ORAL | 0 refills | Status: AC

## 2016-01-04 NOTE — Progress Notes (Signed)
Pre visit review using our clinic review tool, if applicable. No additional management support is needed unless otherwise documented below in the visit note. 

## 2016-01-04 NOTE — Addendum Note (Signed)
Addended by: Leonidas Romberg on: 01/04/2016 10:21 AM   Modules accepted: Orders

## 2016-01-04 NOTE — Progress Notes (Signed)
   Subjective:    Patient ID: Tracy [name], female    DOB: 08-31-1950, 65 y.o.   MRN: [id number]  HPI ? UTI- sxs started 4 days ago w/ urgency, frequency, and dysuria.  Hx of similar.  Urge to urinate every 90 minutes, will get goose bumps when she feels the urge.  Drinking 32 oz of water daily.  Some bladder leakage.  + urine odor.  No hematuria.  No fever, chills, N/V, back pain.  + suprapubic pressure.   Review of Systems For ROS see HPI     Objective:   Physical Exam  Constitutional: She appears well-developed and well-nourished. No distress.  Abdominal: Soft. She exhibits no distension. There is tenderness (+ suprapubic pressure but no CVA tenderness ).  Vitals reviewed.         Assessment & Plan:

## 2016-01-04 NOTE — Assessment & Plan Note (Signed)
Pt's sxs and PE consistent w/ infxn.  Start empiric abx and await cx results.  Will d/c meds if no clinically significant infxn.  Reviewed supportive care and red flags that should prompt return.  Pt expressed understanding and is in agreement w/ plan.

## 2016-01-04 NOTE — Patient Instructions (Signed)
Follow up as needed/scheduled We'll notify you of the culture results Start the Keflex twice daily Drink plenty of fluids Call with any questions or concerns Happy Holidays!!! Hang in there!!!

## 2016-01-06 LAB — URINE CULTURE

## 2016-03-03 DIAGNOSIS — H524 Presbyopia: Secondary | ICD-10-CM | POA: Diagnosis not present

## 2016-03-03 DIAGNOSIS — H5203 Hypermetropia, bilateral: Secondary | ICD-10-CM | POA: Diagnosis not present

## 2016-03-03 DIAGNOSIS — H52203 Unspecified astigmatism, bilateral: Secondary | ICD-10-CM | POA: Diagnosis not present

## 2016-03-21 ENCOUNTER — Encounter: Payer: Self-pay | Admitting: Family Medicine

## 2016-03-21 ENCOUNTER — Ambulatory Visit (INDEPENDENT_AMBULATORY_CARE_PROVIDER_SITE_OTHER): Payer: Medicare Other | Admitting: Family Medicine

## 2016-03-21 VITALS — BP 123/83 | HR 71 | Temp 98.1°F | Resp 16 | Ht 64.0 in | Wt 173.4 lb

## 2016-03-21 DIAGNOSIS — R350 Frequency of micturition: Secondary | ICD-10-CM | POA: Diagnosis not present

## 2016-03-21 LAB — POCT URINALYSIS DIPSTICK
Bilirubin, UA: NEGATIVE
Glucose, UA: NEGATIVE
Ketones, UA: NEGATIVE
Leukocytes, UA: NEGATIVE
NITRITE UA: NEGATIVE
PH UA: 6
Protein, UA: NEGATIVE
RBC UA: NEGATIVE
Spec Grav, UA: 1.025
UROBILINOGEN UA: 0.2

## 2016-03-21 MED ORDER — CEPHALEXIN 500 MG PO CAPS: 500.0000 mg | ORAL_CAPSULE | Freq: Two times a day (BID) | ORAL | 0 refills | Status: AC

## 2016-03-21 NOTE — Patient Instructions (Signed)
Follow up as scheduled/needed Start the Keflex twice daily- take w/ food Drink plenty of fluids Call with any questions or concerns Hang in there!!!

## 2016-03-21 NOTE — Progress Notes (Signed)
Pre visit review using our clinic review tool, if applicable. No additional management support is needed unless otherwise documented below in the visit note. 

## 2016-03-21 NOTE — Progress Notes (Signed)
   Subjective:    Patient ID: Tracy [name], female    DOB: Jan 07, 1951, 66 y.o.   MRN: [id number]  HPI Urine frequency- sxs started 6 days ago w/ frequency, urgency, hesitancy.  Denies dysuria.  sxs initially improved but 'then came back w/ a vengeance'.  Has increased fluid intake and started cranberry pills.  Some suprapubic pressure.  No fevers, N/V, back pain.   Review of Systems For ROS see HPI     Objective:   Physical Exam  Constitutional: She is oriented to person, place, and time. She appears well-developed and well-nourished. No distress.  HENT:  Head: Normocephalic and atraumatic.  Abdominal: Soft. She exhibits no distension. There is no tenderness (no suprapubic or CVA tenderness.  + suprapubic pressure).  Neurological: She is alert and oriented to person, place, and time.  Skin: Skin is warm and dry.  Psychiatric: She has a normal mood and affect. Her behavior is normal. Thought content normal.  Vitals reviewed.         Assessment & Plan:  Urinary frequency- new.  sxs are consistent w/ UTI despite negative UA.  Send for cx.  Start abx.  Reviewed supportive care and red flags that should prompt return.  Pt expressed understanding and is in agreement w/ plan.

## 2016-03-23 LAB — CULTURE, URINE COMPREHENSIVE

## 2016-03-30 ENCOUNTER — Ambulatory Visit: Payer: Medicare Other | Admitting: Internal Medicine

## 2016-04-05 DIAGNOSIS — Z1231 Encounter for screening mammogram for malignant neoplasm of breast: Secondary | ICD-10-CM | POA: Diagnosis not present

## 2016-04-05 LAB — HM MAMMOGRAPHY

## 2016-04-06 ENCOUNTER — Encounter: Payer: Self-pay | Admitting: General Practice

## 2016-05-12 DIAGNOSIS — L570 Actinic keratosis: Secondary | ICD-10-CM | POA: Diagnosis not present

## 2016-05-12 DIAGNOSIS — D1801 Hemangioma of skin and subcutaneous tissue: Secondary | ICD-10-CM | POA: Diagnosis not present

## 2016-05-12 DIAGNOSIS — Z85828 Personal history of other malignant neoplasm of skin: Secondary | ICD-10-CM | POA: Diagnosis not present

## 2016-05-12 DIAGNOSIS — L821 Other seborrheic keratosis: Secondary | ICD-10-CM | POA: Diagnosis not present

## 2016-05-18 ENCOUNTER — Encounter: Payer: Self-pay | Admitting: Family Medicine

## 2016-05-19 ENCOUNTER — Encounter: Payer: Self-pay | Admitting: Family Medicine

## 2016-05-19 ENCOUNTER — Ambulatory Visit (INDEPENDENT_AMBULATORY_CARE_PROVIDER_SITE_OTHER): Payer: Medicare Other | Admitting: Family Medicine

## 2016-05-19 VITALS — BP 119/78 | HR 76 | Temp 97.9°F | Resp 16 | Ht 64.0 in | Wt 169.0 lb

## 2016-05-19 DIAGNOSIS — M8580 Other specified disorders of bone density and structure, unspecified site: Secondary | ICD-10-CM | POA: Diagnosis not present

## 2016-05-19 DIAGNOSIS — Z0000 Encounter for general adult medical examination without abnormal findings: Secondary | ICD-10-CM

## 2016-05-19 DIAGNOSIS — E781 Pure hyperglyceridemia: Secondary | ICD-10-CM | POA: Diagnosis not present

## 2016-05-19 LAB — VITAMIN D 25 HYDROXY (VIT D DEFICIENCY, FRACTURES): VITD: 54.99 ng/mL (ref 30.00–100.00)

## 2016-05-19 LAB — HEPATIC FUNCTION PANEL
ALBUMIN: 4.5 g/dL (ref 3.5–5.2)
ALT: 17 U/L (ref 0–35)
AST: 25 U/L (ref 0–37)
Alkaline Phosphatase: 73 U/L (ref 39–117)
BILIRUBIN TOTAL: 0.5 mg/dL (ref 0.2–1.2)
Bilirubin, Direct: 0.1 mg/dL (ref 0.0–0.3)
Total Protein: 7 g/dL (ref 6.0–8.3)

## 2016-05-19 LAB — CBC WITH DIFFERENTIAL/PLATELET
BASOS ABS: 0.1 10*3/uL (ref 0.0–0.1)
Basophils Relative: 1.1 % (ref 0.0–3.0)
EOS ABS: 0.2 10*3/uL (ref 0.0–0.7)
Eosinophils Relative: 3.2 % (ref 0.0–5.0)
HCT: 43.4 % (ref 36.0–46.0)
HEMOGLOBIN: 14.6 g/dL (ref 12.0–15.0)
Lymphocytes Relative: 21 % (ref 12.0–46.0)
Lymphs Abs: 1.4 10*3/uL (ref 0.7–4.0)
MCHC: 33.7 g/dL (ref 30.0–36.0)
MCV: 89.4 fl (ref 78.0–100.0)
MONO ABS: 0.6 10*3/uL (ref 0.1–1.0)
Monocytes Relative: 8.8 % (ref 3.0–12.0)
Neutro Abs: 4.4 10*3/uL (ref 1.4–7.7)
Neutrophils Relative %: 65.9 % (ref 43.0–77.0)
Platelets: 240 10*3/uL (ref 150.0–400.0)
RBC: 4.86 Mil/uL (ref 3.87–5.11)
RDW: 13.7 % (ref 11.5–15.5)
WBC: 6.6 10*3/uL (ref 4.0–10.5)

## 2016-05-19 LAB — BASIC METABOLIC PANEL
BUN: 30 mg/dL — ABNORMAL HIGH (ref 6–23)
CALCIUM: 10.4 mg/dL (ref 8.4–10.5)
CO2: 30 meq/L (ref 19–32)
CREATININE: 0.85 mg/dL (ref 0.40–1.20)
Chloride: 109 mEq/L (ref 96–112)
GFR: 70.66 mL/min (ref >60.00)
GLUCOSE: 87 mg/dL (ref 70–99)
Potassium: 4.1 mEq/L (ref 3.5–5.1)
Sodium: 142 mEq/L (ref 135–145)

## 2016-05-19 LAB — LIPID PANEL
CHOL/HDL RATIO: 2
Cholesterol: 185 mg/dL (ref 0–200)
HDL: 75.3 mg/dL (ref >39.00)
LDL Cholesterol: 93 mg/dL (ref 0–99)
NONHDL: 109.27
Triglycerides: 80 mg/dL (ref 0.0–149.0)
VLDL: 16 mg/dL (ref 0.0–40.0)

## 2016-05-19 LAB — TSH: TSH: 0.12 u[IU]/mL — ABNORMAL LOW (ref 0.35–4.50)

## 2016-05-19 NOTE — Assessment & Plan Note (Signed)
Pt's PE WNL w/ exception of being overweight.  UTD on colonoscopy, mammo, immunizations.  Stressed need for healthy diet and regular exercise.  Check labs.  Anticipatory guidance provided.

## 2016-05-19 NOTE — Patient Instructions (Addendum)
Follow up in 6 months to recheck cholesterol We'll notify you of your lab results and make any changes if needed Continue to work on healthy diet and regular exercise- you can do it! You are up to date on colonoscopy, mammogram, and immunizations- yay! Call with any questions or concerns [place]!!!  Continue doing brain stimulating activities (puzzles, reading, adult coloring books, staying active) to keep memory sharp.   Continue doing brain stimulating activities (puzzles, reading, adult coloring books, staying active) to keep memory sharp.   Bring a copy of your advance directives to your next office visit.   Fall Prevention in the Home Falls can cause injuries. They can happen to people of all ages. There are many things you can do to make your home safe and to help prevent falls. What can I do on the outside of my home?  Regularly fix the edges of walkways and driveways and fix any cracks.  Remove anything that might make you trip as you walk through a door, such as a raised step or threshold.  Trim any bushes or trees on the path to your home.  Use bright outdoor lighting.  Clear any walking paths of anything that might make someone trip, such as rocks or tools.  Regularly check to see if handrails are loose or broken. Make sure that both sides of any steps have handrails.  Any raised decks and porches should have guardrails on the edges.  Have any leaves, snow, or ice cleared regularly.  Use sand or salt on walking paths during winter.  Clean up any spills in your garage right away. This includes oil or grease spills. What can I do in the bathroom?  Use night lights.  Install grab bars by the toilet and in the tub and shower. Do not use towel bars as grab bars.  Use non-skid mats or decals in the tub or shower.  If you need to sit down in the shower, use a plastic, non-slip stool.  Keep the floor dry. Clean up any water that spills on the floor as soon as it  happens.  Remove soap buildup in the tub or shower regularly.  Attach bath mats securely with double-sided non-slip rug tape.  Do not have throw rugs and other things on the floor that can make you trip. What can I do in the bedroom?  Use night lights.  Make sure that you have a light by your bed that is easy to reach.  Do not use any sheets or blankets that are too big for your bed. They should not hang down onto the floor.  Have a firm chair that has side arms. You can use this for support while you get dressed.  Do not have throw rugs and other things on the floor that can make you trip. What can I do in the kitchen?  Clean up any spills right away.  Avoid walking on wet floors.  Keep items that you use a lot in easy-to-reach places.  If you need to reach something above you, use a strong step stool that has a grab bar.  Keep electrical cords out of the way.  Do not use floor polish or wax that makes floors slippery. If you must use wax, use non-skid floor wax.  Do not have throw rugs and other things on the floor that can make you trip. What can I do with my stairs?  Do not leave any items on the stairs.  Make sure  that there are handrails on both sides of the stairs and use them. Fix handrails that are broken or loose. Make sure that handrails are as long as the stairways.  Check any carpeting to make sure that it is firmly attached to the stairs. Fix any carpet that is loose or worn.  Avoid having throw rugs at the top or bottom of the stairs. If you do have throw rugs, attach them to the floor with carpet tape.  Make sure that you have a light switch at the top of the stairs and the bottom of the stairs. If you do not have them, ask someone to add them for you. What else can I do to help prevent falls?  Wear shoes that:  Do not have high heels.  Have rubber bottoms.  Are comfortable and fit you well.  Are closed at the toe. Do not wear sandals.  If you  use a stepladder:  Make sure that it is fully opened. Do not climb a closed stepladder.  Make sure that both sides of the stepladder are locked into place.  Ask someone to hold it for you, if possible.  Clearly mark and make sure that you can see:  Any grab bars or handrails.  First and last steps.  Where the edge of each step is.  Use tools that help you move around (mobility aids) if they are needed. These include:  Canes.  Walkers.  Scooters.  Crutches.  Turn on the lights when you go into a dark area. Replace any light bulbs as soon as they burn out.  Set up your furniture so you have a clear path. Avoid moving your furniture around.  If any of your floors are uneven, fix them.  If there are any pets around you, be aware of where they are.  Review your medicines with your doctor. Some medicines can make you feel dizzy. This can increase your chance of falling. Ask your doctor what other things that you can do to help prevent falls. This information is not intended to replace advice given to you by your health care provider. Make sure you discuss any questions you have with your health care provider. Document Released: 12/11/2008 Document Revised: 07/23/2015 Document Reviewed: 03/21/2014 Elsevier Interactive Patient Education  2017 [place] Maintenance, Female Adopting a healthy lifestyle and getting preventive care can go a long way to promote health and wellness. Talk with your health care provider about what schedule of regular examinations is right for you. This is a good chance for you to check in with your provider about disease prevention and staying healthy. In between checkups, there are plenty of things you can do on your own. Experts have done a lot of research about which lifestyle changes and preventive measures are most likely to keep you healthy. Ask your health care provider for more information. Weight and diet Eat a healthy diet  Be  sure to include plenty of vegetables, fruits, low-fat dairy products, and lean protein.  Do not eat a lot of foods high in solid fats, added sugars, or salt.  Get regular exercise. This is one of the most important things you can do for your health.  Most adults should exercise for at least 150 minutes each week. The exercise should increase your heart rate and make you sweat (moderate-intensity exercise).  Most adults should also do strengthening exercises at least twice a week. This is in addition to the moderate-intensity exercise. Maintain a healthy  weight  Body mass index (BMI) is a measurement that can be used to identify possible weight problems. It estimates body fat based on height and weight. Your health care provider can help determine your BMI and help you achieve or maintain a healthy weight.  For females 68 years of age and older:  A BMI below 18.5 is considered underweight.  A BMI of 18.5 to 24.9 is normal.  A BMI of 25 to 29.9 is considered overweight.  A BMI of 30 and above is considered obese. Watch levels of cholesterol and blood lipids  You should start having your blood tested for lipids and cholesterol at 68 years of age, then have this test every 5 years.  You may need to have your cholesterol levels checked more often if:  Your lipid or cholesterol levels are high.  You are older than 68 years of age.  You are at high risk for heart disease. Cancer screening Lung Cancer  Lung cancer screening is recommended for adults 1-72 years old who are at high risk for lung cancer because of a history of smoking.  A yearly low-dose CT scan of the lungs is recommended for people who:  Currently smoke.  Have quit within the past 15 years.  Have at least a 30-pack-year history of smoking. A pack year is smoking an average of one pack of cigarettes a day for 1 year.  Yearly screening should continue until it has been 15 years since you quit.  Yearly screening  should stop if you develop a health problem that would prevent you from having lung cancer treatment. Breast Cancer  Practice breast self-awareness. This means understanding how your breasts normally appear and feel.  It also means doing regular breast self-exams. Let your health care provider know about any changes, no matter how small.  If you are in your 20s or 30s, you should have a clinical breast exam (CBE) by a health care provider every 1-3 years as part of a regular health exam.  If you are 8 or older, have a CBE every year. Also consider having a breast X-ray (mammogram) every year.  If you have a family history of breast cancer, talk to your health care provider about genetic screening.  If you are at high risk for breast cancer, talk to your health care provider about having an MRI and a mammogram every year.  Breast cancer gene (BRCA) assessment is recommended for women who have family members with BRCA-related cancers. BRCA-related cancers include:  Breast.  Ovarian.  Tubal.  Peritoneal cancers.  Results of the assessment will determine the need for genetic counseling and BRCA1 and BRCA2 testing. Cervical Cancer  Your health care provider may recommend that you be screened regularly for cancer of the pelvic organs (ovaries, uterus, and vagina). This screening involves a pelvic examination, including checking for microscopic changes to the surface of your cervix (Pap test). You may be encouraged to have this screening done every 3 years, beginning at age 55.  For women ages 71-65, health care providers may recommend pelvic exams and Pap testing every 3 years, or they may recommend the Pap and pelvic exam, combined with testing for human papilloma virus (HPV), every 5 years. Some types of HPV increase your risk of cervical cancer. Testing for HPV may also be done on women of any age with unclear Pap test results.  Other health care providers may not recommend any screening  for nonpregnant women who are considered low risk for  pelvic cancer and who do not have symptoms. Ask your health care provider if a screening pelvic exam is right for you.  If you have had past treatment for cervical cancer or a condition that could lead to cancer, you need Pap tests and screening for cancer for at least 20 years after your treatment. If Pap tests have been discontinued, your risk factors (such as having a new sexual partner) need to be reassessed to determine if screening should resume. Some women have medical problems that increase the chance of getting cervical cancer. In these cases, your health care provider may recommend more frequent screening and Pap tests. Colorectal Cancer  This type of cancer can be detected and often prevented.  Routine colorectal cancer screening usually begins at 68 years of age and continues through 68 years of age.  Your health care provider may recommend screening at an earlier age if you have risk factors for colon cancer.  Your health care provider may also recommend using home test kits to check for hidden blood in the stool.  A small camera at the end of a tube can be used to examine your colon directly (sigmoidoscopy or colonoscopy). This is done to check for the earliest forms of colorectal cancer.  Routine screening usually begins at age 68.  Direct examination of the colon should be repeated every 5-10 years through 68 years of age. However, you may need to be screened more often if early forms of precancerous polyps or small growths are found. Skin Cancer  Check your skin from head to toe regularly.  Tell your health care provider about any new moles or changes in moles, especially if there is a change in a mole's shape or color.  Also tell your health care provider if you have a mole that is larger than the size of a pencil eraser.  Always use sunscreen. Apply sunscreen liberally and repeatedly throughout the day.  Protect  yourself by wearing long sleeves, pants, a wide-brimmed hat, and sunglasses whenever you are outside. Heart disease, diabetes, and high blood pressure  High blood pressure causes heart disease and increases the risk of stroke. High blood pressure is more likely to develop in:  People who have blood pressure in the high end of the normal range (130-139/85-89 mm Hg).  People who are overweight or obese.  People who are African American.  If you are 61-22 years of age, have your blood pressure checked every 3-5 years. If you are 72 years of age or older, have your blood pressure checked every year. You should have your blood pressure measured twice-once when you are at a hospital or clinic, and once when you are not at a hospital or clinic. Record the average of the two measurements. To check your blood pressure when you are not at a hospital or clinic, you can use:  An automated blood pressure machine at a pharmacy.  A home blood pressure monitor.  If you are between 84 years and 39 years old, ask your health care provider if you should take aspirin to prevent strokes.  Have regular diabetes screenings. This involves taking a blood sample to check your fasting blood sugar level.  If you are at a normal weight and have a low risk for diabetes, have this test once every three years after 68 years of age.  If you are overweight and have a high risk for diabetes, consider being tested at a younger age or more often. Preventing infection Hepatitis  B  If you have a higher risk for hepatitis B, you should be screened for this virus. You are considered at high risk for hepatitis B if:  You were born in a country where hepatitis B is common. Ask your health care provider which countries are considered high risk.  Your parents were born in a high-risk country, and you have not been immunized against hepatitis B (hepatitis B vaccine).  You have HIV or AIDS.  You use needles to inject street  drugs.  You live with someone who has hepatitis B.  You have had sex with someone who has hepatitis B.  You get hemodialysis treatment.  You take certain medicines for conditions, including cancer, organ transplantation, and autoimmune conditions. Hepatitis C  Blood testing is recommended for:  Everyone born from [birth year] through 1967.  Anyone with known risk factors for hepatitis C. Sexually transmitted infections (STIs)  You should be screened for sexually transmitted infections (STIs) including gonorrhea and chlamydia if:  You are sexually active and are younger than 68 years of age.  You are older than 68 years of age and your health care provider tells you that you are at risk for this type of infection.  Your sexual activity has changed since you were last screened and you are at an increased risk for chlamydia or gonorrhea. Ask your health care provider if you are at risk.  If you do not have HIV, but are at risk, it may be recommended that you take a prescription medicine daily to prevent HIV infection. This is called pre-exposure prophylaxis (PrEP). You are considered at risk if:  You are sexually active and do not regularly use condoms or know the HIV status of your partner(s).  You take drugs by injection.  You are sexually active with a partner who has HIV. Talk with your health care provider about whether you are at high risk of being infected with HIV. If you choose to begin PrEP, you should first be tested for HIV. You should then be tested every 3 months for as long as you are taking PrEP. Pregnancy  If you are premenopausal and you may become pregnant, ask your health care provider about preconception counseling.  If you may become pregnant, take 400 to 800 micrograms (mcg) of folic acid every day.  If you want to prevent pregnancy, talk to your health care provider about birth control (contraception). Osteoporosis and menopause  Osteoporosis is a disease in  which the bones lose minerals and strength with aging. This can result in serious bone fractures. Your risk for osteoporosis can be identified using a bone density scan.  If you are 29 years of age or older, or if you are at risk for osteoporosis and fractures, ask your health care provider if you should be screened.  Ask your health care provider whether you should take a calcium or vitamin D supplement to lower your risk for osteoporosis.  Menopause may have certain physical symptoms and risks.  Hormone replacement therapy may reduce some of these symptoms and risks. Talk to your health care provider about whether hormone replacement therapy is right for you. Follow these instructions at home:  Schedule regular health, dental, and eye exams.  Stay current with your immunizations.  Do not use any tobacco products including cigarettes, chewing tobacco, or electronic cigarettes.  If you are pregnant, do not drink alcohol.  If you are breastfeeding, limit how much and how often you drink alcohol.  Limit alcohol intake  to no more than 1 drink per day for nonpregnant women. One drink equals 12 ounces of beer, 5 ounces of wine, or 1 ounces of hard liquor.  Do not use street drugs.  Do not share needles.  Ask your health care provider for help if you need support or information about quitting drugs.  Tell your health care provider if you often feel depressed.  Tell your health care provider if you have ever been abused or do not feel safe at home. This information is not intended to replace advice given to you by your health care provider. Make sure you discuss any questions you have with your health care provider. Document Released: 08/30/2010 Document Revised: 07/23/2015 Document Reviewed: 11/18/2014 Elsevier Interactive Patient Education  2017 [employer].

## 2016-05-19 NOTE — Progress Notes (Signed)
   Subjective:    Patient ID: Tracy [name], female    DOB: 18-Feb-1951, 66 y.o.   MRN: [id number]  HPI CPE- UTD on colonoscopy, mammo, immunizations.  No concerns today.   Review of Systems Patient reports no vision/ hearing changes, adenopathy,fever, weight change,  persistant/recurrent hoarseness , swallowing issues, chest pain, palpitations, edema, persistant/recurrent cough, hemoptysis, dyspnea (rest/exertional/paroxysmal nocturnal), gastrointestinal bleeding (melena, rectal bleeding), abdominal pain, significant heartburn, bowel changes, GU symptoms (dysuria, hematuria, incontinence), Gyn symptoms (abnormal  bleeding, pain),  syncope, focal weakness, memory loss, numbness & tingling, skin/hair/nail changes, abnormal bruising or bleeding, anxiety, or depression.     Objective:   Physical Exam General Appearance:    Alert, cooperative, no distress, appears stated age  Head:    Normocephalic, without obvious abnormality, atraumatic  Eyes:    PERRL, conjunctiva/corneas clear, EOM's intact, fundi    benign, both eyes  Ears:    Normal TM's and external ear canals, both ears  Nose:   Nares normal, septum midline, mucosa normal, no drainage    or sinus tenderness  Throat:   Lips, mucosa, and tongue normal; teeth and gums normal  Neck:   Supple, symmetrical, trachea midline, no adenopathy;    Thyroid: no enlargement/tenderness/nodules  Back:     Symmetric, no curvature, ROM normal, no CVA tenderness  Lungs:     Clear to auscultation bilaterally, respirations unlabored  Chest Wall:    No tenderness or deformity   Heart:    Regular rate and rhythm, S1 and S2 normal, no murmur, rub   or gallop  Breast Exam:    Deferred to mammo  Abdomen:     Soft, non-tender, bowel sounds active all four quadrants,    no masses, no organomegaly  Genitalia:    Deferred  Rectal:    Extremities:   Extremities normal, atraumatic, no cyanosis or edema  Pulses:   2+ and symmetric all extremities  Skin:    Skin color, texture, turgor normal, no rashes or lesions  Lymph nodes:   Cervical, supraclavicular, and axillary nodes normal  Neurologic:   CNII-XII intact, normal strength, sensation and reflexes    throughout          Assessment & Plan:

## 2016-05-19 NOTE — Assessment & Plan Note (Signed)
Chronic problem, tolerating fenofibrate.  Check labs.  Adjust meds prn

## 2016-05-19 NOTE — Progress Notes (Addendum)
Subjective:   Tracy [name] is a 66 y.o. female who presents for Medicare Annual (Subsequent) preventive examination.  Review of Systems:  No ROS.  Medicare Wellness Visit.  Cardiac Risk Factors include: advanced age (>32men, >41 women);dyslipidemia;family history of premature cardiovascular disease   Sleep patterns: Sleeps about 8 hours, up to void x 3. Takes Melatonin.  Home Safety/Smoke Alarms:  Smoke detectors in place.  Living environment; residence and Firearm Safety: Lives with husband in 2 story home with rail. Feels safe in home. No firearms.  Seat Belt Safety/Bike Helmet: Wears seat belt.   Counseling:   Eye Exam-Last exam 02/2016, yearly by Gershon Crane. Reports cataracts.   Dental-Last exam 11/2015, every 6 months by Dr. Glennon Hamilton.   Female:   Pap-12/2010     Mammo-04/05/2016, negative      Dexa scan-03/20/2014, osteopenia.  Will schedule with Mammogram 03/2017.      CCS-colonoscopy 09/06/2010, diverticulosis. [facility]. Recall 10 years. Would like to change to [facility].       Objective:     Vitals: BP 119/78   Pulse 76   Temp 97.9 F (36.6 C) (Oral)   Resp 16   Ht 5\' 4"  (1.626 m)   Wt 169 lb (76.7 kg)   SpO2 98%   BMI 29.01 kg/m   Body mass index is 29.01 kg/m.   Tobacco History  Smoking Status  . Never Smoker  Smokeless Tobacco  . Never Used     Counseling given: Yes   Past Medical History:  Diagnosis Date  . Anxiety   . Arthritis   . Bursitis of left hip   . Cancer (HCC)    basil cell  . Complication of anesthesia   . Diverticulitis   . GERD (gastroesophageal reflux disease)   . Hx: UTI (urinary tract infection)   . Hyperlipidemia   . Hyperthyroidism   . Osteopenia   . PONV (postoperative nausea and vomiting)   . Rosacea    Past Surgical History:  Procedure Laterality Date  . APPENDECTOMY    . BACK SURGERY    . CHOLECYSTECTOMY    . FOOT SURGERY    . KNEE SURGERY    . LAPAROSCOPIC LIVER CYST FENESTRATION    . OPEN SURGICAL  REPAIR OF GLUTEAL TENDON Left 04/01/2014   Procedure: LEFT HIP BURSECTOMY WITH GLUTEAL TENDON REPAIR;  Surgeon: Gearlean Alf, MD;  Location: [facility];  Service: Orthopedics;  Laterality: Left;  . TONSILLECTOMY    . TOTAL KNEE ARTHROPLASTY Right 08/17/2015   Procedure: RIGHT TOTAL KNEE ARTHROPLASTY;  Surgeon: Gaynelle Arabian, MD;  Location: [facility];  Service: Orthopedics;  Laterality: Right;  . TUBAL LIGATION     Family History  Problem Relation Age of Onset  . Hypertension Mother   . Stroke Mother   . Cancer Neg Hx   . Diabetes Neg Hx    History  Sexual Activity  . Sexual activity: Not on file    Outpatient Encounter Prescriptions as of 05/19/2016  Medication Sig  . fenofibrate 160 MG tablet Take 1 tablet (160 mg total) by mouth daily.  . Melatonin 5 MG CAPS Take by mouth.  . metroNIDAZOLE (METROGEL) 0.75 % gel Apply 1 application topically 2 (two) times daily as needed (For rosacea.).  Marland Kitchen omeprazole (PRILOSEC) 40 MG capsule Take 1 capsule (40 mg total) by mouth daily.  Marland Kitchen OVER THE COUNTER MEDICATION Take 15 mLs by mouth daily. Metamucil Powder  . doxycycline (VIBRA-TABS) 100 MG tablet Take 1 tablet (  100 mg total) by mouth 2 (two) times daily. (Patient not taking: Reported on 05/19/2016)  . [DISCONTINUED] conjugated estrogens (PREMARIN) vaginal cream Apply vaginally 2-3 times weekly as needed for vaginal irritation. (Patient taking differently: Place 1 Applicatorful vaginally at bedtime as needed (For vaginal irritation.). )  . [DISCONTINUED] diphenhydrAMINE (BENADRYL) 25 MG tablet Take 25 mg by mouth at bedtime.   No facility-administered encounter medications on file as of 05/19/2016.     Activities of Daily Living In your present state of health, do you have any difficulty performing the following activities: 05/19/2016 05/19/2016  Hearing? N -  Vision? N -  Difficulty concentrating or making decisions? N -  Walking or climbing stairs? N -  Dressing or bathing? N -  Doing errands,  shopping? N -  Preparing Food and eating ? N N  Using the Toilet? N N  In the past six months, have you accidently leaked urine? N N  Do you have problems with loss of bowel control? N N  Managing your Medications? N N  Managing your Finances? N N  Housekeeping or managing your Housekeeping? N N  Some recent data might be hidden    Patient Care Team: [name], MD as PCP - General Garlan Fair, MD as Consulting Physician (Gastroenterology) [name], MD as Consulting Physician (Internal Medicine) Rutherford Guys, MD as Consulting Physician (Ophthalmology) Festus Aloe, MD as Consulting Physician (Urology) Gaynelle Arabian, MD as Consulting Physician (Orthopedic Surgery) Harriett Sine, MD as Consulting Physician (Dermatology)    Assessment:    Physical assessment deferred to PCP.  Exercise Activities and Dietary recommendations Current Exercise Habits: Home exercise routine, Type of exercise: walking, Time (Minutes): 30, Frequency (Times/Week): 7, Weekly Exercise (Minutes/Week): 210, Intensity: Mild, Exercise limited by: orthopedic condition(s) (hip and knee discomfort)  Diet (meal preparation, eat out, water intake, caffeinated beverages, dairy products, fruits and vegetables): Drinks water. Limited fruit intake.   Breakfast: oatmeal, cereal, muffins, sausage, bagel Lunch: sandwich (half) Dinner: lean protein, vegetables, starch  Discussed continuing healthy food choices and walking daily.   Goals      Patient Stated   . patient states (pt-stated)          Would like to get back to meeting group of friends for lunch, become social again.       Fall Risk Fall Risk  05/19/2016 05/19/2016 03/13/2015 12/04/2014 03/10/2014  Falls in the past year? No No No No No   Depression Screen PHQ 2/9 Scores 05/19/2016 05/19/2016 03/21/2016 03/13/2015  PHQ - 2 Score 2 0 0 0  PHQ- 9 Score - 0 0 -  Exception Documentation - - - Patient refusal    Patient reports family  issues that are causing anxiety. Discussed coping mechanisms.   Cognitive Function       Ad8 score reviewed for issues:  Issues making decisions: no  Less interest in hobbies / activities: no  Repeats questions, stories (family complaining): no  Trouble using ordinary gadgets (microwave, computer, phone): no  Forgets the month or year:  no  Mismanaging finances: no  Remembering appts: no  Daily problems with thinking and/or memory: no Ad8 score is=0     Immunization History  Administered Date(s) Administered  . Influenza Whole 11/28/2012  . Influenza, High Dose Seasonal PF 11/10/2014, 10/26/2015  . Influenza,inj,Quad PF,36+ Mos 12/09/2013  . Pneumococcal Conjugate-13 09/08/2014  . Pneumococcal Polysaccharide-23 07/16/2013  . Tdap 06/28/2012   Screening Tests Health Maintenance  Topic Date Due  . MAMMOGRAM  04/05/2017  . COLONOSCOPY  09/05/2020  . TETANUS/TDAP  06/29/2022  . INFLUENZA VACCINE  Completed  . DEXA SCAN  Completed  . Hepatitis C Screening  Completed  . PNA vac Low Risk Adult  Completed      Plan:     Continue doing brain stimulating activities (puzzles, reading, adult coloring books, staying active) to keep memory sharp.   Bring a copy of your advance directives to your next office visit.  During the course of the visit the patient was educated and counseled about the following appropriate screening and preventive services:   Vaccines to include Pneumoccal, Influenza, Hepatitis B, Td, Zostavax, HCV  Cardiovascular Disease  Colorectal cancer screening  Bone density screening  Diabetes screening  Glaucoma screening  Mammography/PAP  Nutrition counseling   Patient Instructions (the written plan) was given to the patient.   Gerilyn Nestle, RN  05/19/2016  Reviewed documentation and agree w/ above.  Annye Asa, MD

## 2016-05-19 NOTE — Progress Notes (Signed)
Pre visit review using our clinic review tool, if applicable. No additional management support is needed unless otherwise documented below in the visit note. 

## 2016-05-19 NOTE — Assessment & Plan Note (Signed)
Chronic problem.  On Ca and Vit D.  Check Vit D level- replete prn.

## 2016-05-20 ENCOUNTER — Other Ambulatory Visit (INDEPENDENT_AMBULATORY_CARE_PROVIDER_SITE_OTHER): Payer: Medicare Other

## 2016-05-20 ENCOUNTER — Encounter: Payer: Self-pay | Admitting: Family Medicine

## 2016-05-20 DIAGNOSIS — R7989 Other specified abnormal findings of blood chemistry: Secondary | ICD-10-CM

## 2016-05-20 DIAGNOSIS — E0590 Thyrotoxicosis, unspecified without thyrotoxic crisis or storm: Secondary | ICD-10-CM

## 2016-05-20 LAB — T3, FREE: T3 FREE: 4.1 pg/mL (ref 2.3–4.2)

## 2016-05-20 LAB — T4, FREE: FREE T4: 1.29 ng/dL (ref 0.60–1.60)

## 2016-05-21 ENCOUNTER — Encounter: Payer: Self-pay | Admitting: Family Medicine

## 2016-05-23 MED ORDER — FENOFIBRATE 160 MG PO TABS: 160.0000 mg | ORAL_TABLET | Freq: Every day | ORAL | 1 refills | Status: DC

## 2016-05-25 ENCOUNTER — Ambulatory Visit: Payer: Medicare Other | Admitting: Internal Medicine

## 2016-05-26 ENCOUNTER — Other Ambulatory Visit: Payer: Self-pay | Admitting: General Practice

## 2016-05-26 DIAGNOSIS — Z96651 Presence of right artificial knee joint: Secondary | ICD-10-CM | POA: Diagnosis not present

## 2016-05-26 DIAGNOSIS — Z471 Aftercare following joint replacement surgery: Secondary | ICD-10-CM | POA: Diagnosis not present

## 2016-05-26 MED ORDER — OMEPRAZOLE 40 MG PO CPDR: 40.0000 mg | DELAYED_RELEASE_CAPSULE | Freq: Every day | ORAL | 1 refills | Status: DC

## 2016-07-04 ENCOUNTER — Encounter: Payer: Self-pay | Admitting: Family Medicine

## 2016-07-05 ENCOUNTER — Ambulatory Visit (HOSPITAL_BASED_OUTPATIENT_CLINIC_OR_DEPARTMENT_OTHER)
Admission: RE | Admit: 2016-07-05 | Discharge: 2016-07-05 | Disposition: A | Discharge status: Home / Self Care | Payer: Medicare Other | Source: Ambulatory Visit | Attending: Family Medicine | Admitting: Family Medicine

## 2016-07-05 ENCOUNTER — Encounter (HOSPITAL_BASED_OUTPATIENT_CLINIC_OR_DEPARTMENT_OTHER): Payer: Self-pay | Admitting: Emergency Medicine

## 2016-07-05 ENCOUNTER — Ambulatory Visit (INDEPENDENT_AMBULATORY_CARE_PROVIDER_SITE_OTHER): Payer: Medicare Other | Admitting: Family Medicine

## 2016-07-05 ENCOUNTER — Encounter: Payer: Self-pay | Admitting: Family Medicine

## 2016-07-05 ENCOUNTER — Encounter (HOSPITAL_BASED_OUTPATIENT_CLINIC_OR_DEPARTMENT_OTHER): Payer: Self-pay

## 2016-07-05 ENCOUNTER — Inpatient Hospital Stay (HOSPITAL_BASED_OUTPATIENT_CLINIC_OR_DEPARTMENT_OTHER)
Admission: EM | Admit: 2016-07-05 | Discharge: 2016-07-17 | DRG: 392 | Disposition: A | Discharge status: Home / Self Care | Payer: Medicare Other | Attending: Family Medicine | Admitting: Family Medicine

## 2016-07-05 VITALS — BP 123/83 | HR 72 | Temp 98.4°F | Resp 17 | Ht 69.0 in | Wt 168.5 lb

## 2016-07-05 DIAGNOSIS — K219 Gastro-esophageal reflux disease without esophagitis: Secondary | ICD-10-CM | POA: Diagnosis not present

## 2016-07-05 DIAGNOSIS — Z96651 Presence of right artificial knee joint: Secondary | ICD-10-CM | POA: Diagnosis not present

## 2016-07-05 DIAGNOSIS — R109 Unspecified abdominal pain: Secondary | ICD-10-CM | POA: Diagnosis not present

## 2016-07-05 DIAGNOSIS — K5720 Diverticulitis of large intestine with perforation and abscess without bleeding: Principal | ICD-10-CM | POA: Diagnosis present

## 2016-07-05 DIAGNOSIS — Z8249 Family history of ischemic heart disease and other diseases of the circulatory system: Secondary | ICD-10-CM

## 2016-07-05 DIAGNOSIS — Z91040 Latex allergy status: Secondary | ICD-10-CM

## 2016-07-05 DIAGNOSIS — R1084 Generalized abdominal pain: Secondary | ICD-10-CM

## 2016-07-05 DIAGNOSIS — E785 Hyperlipidemia, unspecified: Secondary | ICD-10-CM | POA: Diagnosis present

## 2016-07-05 DIAGNOSIS — Z888 Allergy status to other drugs, medicaments and biological substances status: Secondary | ICD-10-CM

## 2016-07-05 DIAGNOSIS — Z79899 Other long term (current) drug therapy: Secondary | ICD-10-CM

## 2016-07-05 DIAGNOSIS — A0472 Enterocolitis due to Clostridium difficile, not specified as recurrent: Secondary | ICD-10-CM | POA: Diagnosis not present

## 2016-07-05 DIAGNOSIS — Z9049 Acquired absence of other specified parts of digestive tract: Secondary | ICD-10-CM

## 2016-07-05 DIAGNOSIS — R6889 Other general symptoms and signs: Secondary | ICD-10-CM

## 2016-07-05 DIAGNOSIS — E876 Hypokalemia: Secondary | ICD-10-CM | POA: Diagnosis present

## 2016-07-05 DIAGNOSIS — K5792 Diverticulitis of intestine, part unspecified, without perforation or abscess without bleeding: Secondary | ICD-10-CM | POA: Diagnosis present

## 2016-07-05 DIAGNOSIS — E0590 Thyrotoxicosis, unspecified without thyrotoxic crisis or storm: Secondary | ICD-10-CM | POA: Diagnosis present

## 2016-07-05 DIAGNOSIS — F419 Anxiety disorder, unspecified: Secondary | ICD-10-CM | POA: Diagnosis present

## 2016-07-05 DIAGNOSIS — K5732 Diverticulitis of large intestine without perforation or abscess without bleeding: Secondary | ICD-10-CM

## 2016-07-05 DIAGNOSIS — Z85828 Personal history of other malignant neoplasm of skin: Secondary | ICD-10-CM | POA: Diagnosis not present

## 2016-07-05 LAB — CBC WITH DIFFERENTIAL/PLATELET
Basophils Absolute: 0.1 10*3/uL (ref 0.0–0.1)
Basophils Relative: 0.5 % (ref 0.0–3.0)
Eosinophils Absolute: 0.1 10*3/uL (ref 0.0–0.7)
Eosinophils Relative: 0.5 % (ref 0.0–5.0)
HCT: 42 % (ref 36.0–46.0)
HEMOGLOBIN: 14 g/dL (ref 12.0–15.0)
LYMPHS ABS: 1.3 10*3/uL (ref 0.7–4.0)
Lymphocytes Relative: 11.9 % — ABNORMAL LOW (ref 12.0–46.0)
MCHC: 33.3 g/dL (ref 30.0–36.0)
MCV: 90.3 fl (ref 78.0–100.0)
MONOS PCT: 4.6 % (ref 3.0–12.0)
Monocytes Absolute: 0.5 10*3/uL (ref 0.1–1.0)
NEUTROS PCT: 82.5 % — AB (ref 43.0–77.0)
Neutro Abs: 9.2 10*3/uL — ABNORMAL HIGH (ref 1.4–7.7)
Platelets: 238 10*3/uL (ref 150.0–400.0)
RBC: 4.66 Mil/uL (ref 3.87–5.11)
RDW: 13.8 % (ref 11.5–15.5)
WBC: 11.2 10*3/uL — AB (ref 4.0–10.5)

## 2016-07-05 LAB — POC INFLUENZA A&B (BINAX/QUICKVUE)
Influenza A, POC: NEGATIVE
Influenza B, POC: NEGATIVE

## 2016-07-05 LAB — HEPATIC FUNCTION PANEL
ALBUMIN: 4.4 g/dL (ref 3.5–5.2)
ALK PHOS: 67 U/L (ref 39–117)
ALT: 16 U/L (ref 0–35)
AST: 22 U/L (ref 0–37)
BILIRUBIN TOTAL: 0.8 mg/dL (ref 0.2–1.2)
Bilirubin, Direct: 0.2 mg/dL (ref 0.0–0.3)
Total Protein: 6.9 g/dL (ref 6.0–8.3)

## 2016-07-05 LAB — BASIC METABOLIC PANEL
BUN: 26 mg/dL — ABNORMAL HIGH (ref 6–23)
CHLORIDE: 104 meq/L (ref 96–112)
CO2: 29 meq/L (ref 19–32)
Calcium: 9.9 mg/dL (ref 8.4–10.5)
Creatinine, Ser: 0.86 mg/dL (ref 0.40–1.20)
GFR: 69.69 mL/min (ref >60.00)
GLUCOSE: 96 mg/dL (ref 70–99)
POTASSIUM: 4 meq/L (ref 3.5–5.1)
SODIUM: 140 meq/L (ref 135–145)

## 2016-07-05 LAB — I-STAT CG4 LACTIC ACID, ED: Lactic Acid, Venous: 0.67 mmol/L (ref 0.5–1.9)

## 2016-07-05 MED ORDER — HYDROCODONE-ACETAMINOPHEN 5-325 MG PO TABS
1.0000 | ORAL_TABLET | Freq: Four times a day (QID) | ORAL | Status: DC | PRN
  Administered 2016-07-05 – 2016-07-06 (×2): 2 via ORAL
  Administered 2016-07-06: 1 via ORAL
  Administered 2016-07-06 – 2016-07-07 (×4): 2 via ORAL
  Administered 2016-07-08 – 2016-07-11 (×10): 1 via ORAL
  Filled 2016-07-05 (×2): qty 2
  Filled 2016-07-05: qty 1
  Filled 2016-07-05: qty 2
  Filled 2016-07-05 (×2): qty 1
  Filled 2016-07-05: qty 2
  Filled 2016-07-05 (×4): qty 1
  Filled 2016-07-05 (×2): qty 2
  Filled 2016-07-05 (×2): qty 1
  Filled 2016-07-05 (×2): qty 2

## 2016-07-05 MED ORDER — METRONIDAZOLE 0.75 % EX GEL: 1.0000 "application " | Freq: Every day | CUTANEOUS | Status: DC | PRN

## 2016-07-05 MED ORDER — ACETAMINOPHEN 500 MG PO TABS
1000.0000 mg | ORAL_TABLET | Freq: Once | ORAL | Status: AC
  Administered 2016-07-05: 1000 mg via ORAL
  Filled 2016-07-05: qty 2

## 2016-07-05 MED ORDER — SODIUM CHLORIDE 0.9 % IV SOLN
INTRAVENOUS | Status: AC
  Administered 2016-07-05 – 2016-07-06 (×2): via INTRAVENOUS

## 2016-07-05 MED ORDER — PIPERACILLIN-TAZOBACTAM 3.375 G IVPB
3.3750 g | Freq: Three times a day (TID) | INTRAVENOUS | Status: DC
  Administered 2016-07-06 – 2016-07-09 (×11): 3.375 g via INTRAVENOUS
  Filled 2016-07-05 (×12): qty 50

## 2016-07-05 MED ORDER — SODIUM CHLORIDE 0.9 % IV BOLUS (SEPSIS)
1000.0000 mL | Freq: Once | INTRAVENOUS | Status: AC
  Administered 2016-07-05: 1000 mL via INTRAVENOUS

## 2016-07-05 MED ORDER — VITAMIN B-12 1000 MCG PO TABS
1000.0000 ug | ORAL_TABLET | Freq: Every day | ORAL | Status: DC
  Administered 2016-07-06 – 2016-07-17 (×12): 1000 ug via ORAL
  Filled 2016-07-05 (×12): qty 1

## 2016-07-05 MED ORDER — PIPERACILLIN-TAZOBACTAM 3.375 G IVPB 30 MIN
3.3750 g | Freq: Once | INTRAVENOUS | Status: AC
  Administered 2016-07-05: 3.375 g via INTRAVENOUS
  Filled 2016-07-05 (×2): qty 50

## 2016-07-05 MED ORDER — ONDANSETRON HCL 4 MG/2ML IJ SOLN
4.0000 mg | Freq: Four times a day (QID) | INTRAMUSCULAR | Status: DC | PRN
  Administered 2016-07-07: 4 mg via INTRAVENOUS
  Filled 2016-07-05: qty 2

## 2016-07-05 MED ORDER — CALCIUM CARBONATE-VITAMIN D 500-200 MG-UNIT PO TABS
2.0000 | ORAL_TABLET | Freq: Two times a day (BID) | ORAL | Status: DC
  Administered 2016-07-06: 11:00:00 2 via ORAL
  Filled 2016-07-05: qty 2

## 2016-07-05 MED ORDER — IOPAMIDOL (ISOVUE-300) INJECTION 61%
100.0000 mL | Freq: Once | INTRAVENOUS | Status: AC | PRN
  Administered 2016-07-05: 100 mL via INTRAVENOUS

## 2016-07-05 MED ORDER — ONDANSETRON HCL 4 MG PO TABS: 4.0000 mg | ORAL_TABLET | Freq: Four times a day (QID) | ORAL | Status: DC | PRN

## 2016-07-05 MED ORDER — ACETAMINOPHEN 325 MG PO TABS
650.0000 mg | ORAL_TABLET | Freq: Four times a day (QID) | ORAL | Status: DC | PRN
  Administered 2016-07-06 – 2016-07-12 (×4): 650 mg via ORAL
  Administered 2016-07-13 – 2016-07-14 (×2): 325 mg via ORAL
  Administered 2016-07-14 – 2016-07-16 (×3): 650 mg via ORAL
  Filled 2016-07-05 (×9): qty 2

## 2016-07-05 MED ORDER — MORPHINE SULFATE (PF) 4 MG/ML IV SOLN
1.0000 mg | INTRAVENOUS | Status: DC | PRN
  Administered 2016-07-06 (×2): 3 mg via INTRAVENOUS
  Filled 2016-07-05 (×2): qty 1

## 2016-07-05 MED ORDER — PIPERACILLIN-TAZOBACTAM 4.5 G IVPB
4.5000 g | Freq: Once | INTRAVENOUS | Status: DC
  Filled 2016-07-05: qty 100

## 2016-07-05 NOTE — Progress Notes (Signed)
   Subjective:    Patient ID: Tracy [name], female    DOB: May 27, 1950, 66 y.o.   MRN: [id number]  HPI Fever/chills- sxs started abruptly yesterday w/ temp to 101.  Severe headache, body aches.  Fever broke last night- woke up soaked.  Now having abdominal pain- 'extremely tender'.  No N/V.  Was constipated until yesterday and then this AM has gone 3x- loose stools.  No known sick contacts but traveled recently.  No new or different foods.  No blood in stool or urine.   Review of Systems For ROS see HPI     Objective:   Physical Exam  Constitutional: She is oriented to person, place, and time. She appears well-developed and well-nourished.  Obviously uncomfortable  HENT:  Head: Normocephalic and atraumatic.  Eyes: Conjunctivae and EOM are normal. Pupils are equal, round, and reactive to light.  Neck: Normal range of motion. Neck supple.  Cardiovascular: Normal rate, regular rhythm and normal heart sounds.   Pulmonary/Chest: Effort normal and breath sounds normal. No respiratory distress. She has no wheezes. She has no rales.  Abdominal: She exhibits distension. There is tenderness (pt w/ marked TTP across abdomen- pain out of proportion to exam.  pain when lying down,  rising to a seated position). There is guarding (voluntary guarding). There is no rebound.  Lymphadenopathy:    She has no cervical adenopathy.  Neurological: She is alert and oriented to person, place, and time.  Skin: Skin is warm and dry.  Vitals reviewed.         Assessment & Plan:  abd pain- new.  Pt's sudden onset fever of fever, body aches and HAs are consistent w/ flu but test was negative.  Pt's new onset and severe abd pain is the most concerning feature.  She has already had an appendectomy so differential includes diverticulitis, ischemic colitis, SBO vs other intra-abdominal process.  Get stat lab work and send for CT.  Vitals are stable so will hold off on ER at this time but pt and husband were  cautioned that if sxs change or worsen prior to completion of CT they need to go directly to ER.  Pt expressed understanding and is in agreement w/ plan.

## 2016-07-05 NOTE — Patient Instructions (Signed)
Follow up as needed We'll notify you of your CT scan and lab results If your pain changes or worsens- GO TO THE ER!!! Drink plenty of fluids REST! Call with any questions or concerns Hang in there!!!

## 2016-07-05 NOTE — Progress Notes (Signed)
Pharmacy Antibiotic Note  Tracy [name] is a 66 y.o. female admitted on 07/05/2016 with intra-abdominal infection.   Pt recently went to her PCP as she began to have sudden onset of fever up to 101F, with HA and body aches. Pt was sent for CT abdomen and pelvis which did show acute sigmoid diverticulitis with microperforation. Pharmacy has been consulted for zosyn dosing.  Plan: Zosyn 3.375g IV Q8H infused over 4hrs. Pharmacy will sign off and follow peripherally  Height: 5\' 6"  (167.6 cm) Weight: 176 lb 9.4 oz (80.1 kg) IBW/kg (Calculated) : 59.3  Temp (24hrs), Avg:98.3 F (36.8 C), Min:98 F (36.7 C), Max:98.7 F (37.1 C)   Recent Labs Lab 07/05/16 1019 07/05/16 1545  WBC 11.2*  --   CREATININE 0.86  --   LATICACIDVEN  --  0.67    Estimated Creatinine Clearance: 66.8 mL/min (by C-G formula based on SCr of 0.86 mg/dL).    Allergies  Allergen Reactions  . Demerol [Meperidine] Nausea And Vomiting  . Latex Rash and Other (See Comments)    Reaction:  Blisters     Antimicrobials this admission: 5/8 zosyn >>  Microbiology results: 5/8 BCx: sent  Thank you for allowing pharmacy to be a part of this patient's care.  Dolly Rias RPh 07/05/2016, 6:42 PM Pager [phone]

## 2016-07-05 NOTE — Plan of Care (Signed)
66 year old female with history of GERD presented [facility] with complaints of abdominal pain. Patient recently went to her primary care physician as she began to have sudden onset of fever up to 101F, with headache and body aches. Patient was sent for CT abdomen and pelvis which did show acute sigmoid diverticulitis with microperforation. EDP, Dr. Laverta Baltimore, discussed with general surgery who will see patient in consult Elvina Sidle. Patient admitted for observation to medical floor.patient will likely need IV pain medication.  Please call general surgery for consult upon arrival to [facility].  Time spent: 5 minutes  Jetaime Pinnix D.O. [facility] Pager [phone]  If 7PM-7AM, please contact night-coverage [email or web address] Password [email or web address] 07/05/2016, 4:02 PM

## 2016-07-05 NOTE — ED Provider Notes (Signed)
Emergency Department Provider Note   I have reviewed the triage vital signs and the nursing notes.   HISTORY  Chief Complaint Abdominal Pain   HPI Tracy [name] is a 66 y.o. female with PMH diverticulitis presents to the emergency department for evaluation of lower abdominal pain and fever last night. Patient went to her primary care physician today with worsening pain and had an outpatient CT scan which showed a diverticulitis with microperforation. Patient states that her lower abdominal pain continues to worsen. No fevers today. No chest pain or difficulty breathing. She does have some nonbloody loose stools but no vomiting No radiation of symptoms.   Past Medical History:  Diagnosis Date  . Anxiety   . Arthritis   . Bursitis of left hip   . Cancer (HCC)    basil cell  . Complication of anesthesia   . Diverticulitis   . GERD (gastroesophageal reflux disease)   . Hx: UTI (urinary tract infection)   . Hyperlipidemia   . Hyperthyroidism   . Osteopenia   . PONV (postoperative nausea and vomiting)   . Rosacea     Patient Active Problem List   Diagnosis Date Noted  . Perforation of sigmoid colon due to diverticulitis 07/05/2016  . OA (osteoarthritis) of knee 08/17/2015  . Dysuria 07/01/2015  . 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 Subclinical hyperthyroidism 06/05/2014  . UTI (urinary tract infection) 05/29/2014  . Trochanteric bursitis of left hip 04/01/2014  . Rosacea 03/26/2014  . Folliculitis 03/50/0938  . Allergic rhinitis 02/19/2014  . Left hip pain 12/19/2013  . Other malaise and fatigue 11/26/2013  . Acute sinusitis 11/26/2013  . Atrophic vaginitis 10/07/2013  . RUQ pain 01/16/2012  . Screening for malignant neoplasm of the cervix 01/12/2011  . Physical exam 01/12/2011  . HYPERTRIGLYCERIDEMIA 12/03/2009  . GERD 12/03/2009  . UTI'S, RECURRENT 12/03/2009  . LEG CRAMPS, NOCTURNAL 12/03/2009  . DIVERTICULITIS, HX OF 12/03/2009  . ANXIETY 11/05/2009  .  Osteopenia 11/05/2009    Past Surgical History:  Procedure Laterality Date  . APPENDECTOMY    . BACK SURGERY    . CHOLECYSTECTOMY    . FOOT SURGERY    . KNEE SURGERY    . LAPAROSCOPIC LIVER CYST FENESTRATION    . OPEN SURGICAL REPAIR OF GLUTEAL TENDON Left 04/01/2014   Procedure: LEFT HIP BURSECTOMY WITH GLUTEAL TENDON REPAIR;  Surgeon: Gearlean Alf, MD;  Location: [facility];  Service: Orthopedics;  Laterality: Left;  . TONSILLECTOMY    . TOTAL KNEE ARTHROPLASTY Right 08/17/2015   Procedure: RIGHT TOTAL KNEE ARTHROPLASTY;  Surgeon: Gaynelle Arabian, MD;  Location: [facility];  Service: Orthopedics;  Laterality: Right;  . TUBAL LIGATION        Allergies Demerol [meperidine] and Latex  Family History  Problem Relation Age of Onset  . Hypertension Mother   . Stroke Mother   . Cancer Neg Hx   . Diabetes Neg Hx     Social History Social History  Substance Use Topics  . Smoking status: Never Smoker  . Smokeless tobacco: Never Used  . Alcohol use Yes     Comment: OCCASIONAL    Review of Systems  Constitutional: No fever/chills Eyes: No visual changes. ENT: No sore throat. Cardiovascular: Denies chest pain. Respiratory: Denies shortness of breath. Gastrointestinal: Positive lower abdominal pain.  No nausea, no vomiting.  No diarrhea.  No constipation. Genitourinary: Negative for dysuria. Musculoskeletal: Negative for back pain. Skin: Negative for rash. Neurological: Negative for headaches, focal weakness  or numbness.  10-point ROS otherwise negative.  ____________________________________________   PHYSICAL EXAM:  VITAL SIGNS: ED Triage Vitals  Enc Vitals Group     BP 07/05/16 1418 95/77     Pulse Rate 07/05/16 1418 88     Resp 07/05/16 1418 18     Temp 07/05/16 1418 98 F (36.7 C)     Temp Source 07/05/16 1418 Oral     SpO2 07/05/16 1418 98 %     Weight 07/05/16 1419 168 lb (76.2 kg)     Height 07/05/16 1419 5\' 4"  (1.626 m)     Pain Score 07/05/16 1421 7    Constitutional: Alert and oriented. Well appearing and in no acute distress. Eyes: Conjunctivae are normal. Head: Atraumatic. Nose: No congestion/rhinnorhea. Mouth/Throat: Mucous membranes are moist.  Oropharynx non-erythematous. Neck: No stridor.   Cardiovascular: Normal rate, regular rhythm. Good peripheral circulation. Grossly normal heart sounds.   Respiratory: Normal respiratory effort.  No retractions. Lungs CTAB. Gastrointestinal: Soft with focal lower abdominal tenderness. No rebound or guarding. No distention.  Musculoskeletal: No lower extremity tenderness nor edema. No gross deformities of extremities. Neurologic:  Normal speech and language. No gross focal neurologic deficits are appreciated.  Skin:  Skin is warm, dry and intact. No rash noted.  ____________________________________________   LABS (all labs ordered are listed, but only abnormal results are displayed)  Labs Reviewed  CULTURE, BLOOD (ROUTINE X 2)  CULTURE, BLOOD (ROUTINE X 2)  CBC WITH DIFFERENTIAL/PLATELET  COMPREHENSIVE METABOLIC PANEL  PROTIME-INR  I-STAT CG4 LACTIC ACID, ED   ____________________________________________  RADIOLOGY  Ct Abdomen Pelvis W Contrast  Result Date: 07/05/2016 CLINICAL DATA:  Diffuse low abdominal pain for 1 day with fever. History of diverticulitis and ischemic colitis. EXAM: CT ABDOMEN AND PELVIS WITH CONTRAST TECHNIQUE: Multidetector CT imaging of the abdomen and pelvis was performed using the standard protocol following bolus administration of intravenous contrast. CONTRAST:  173mL ISOVUE-300 IOPAMIDOL (ISOVUE-300) INJECTION 61% COMPARISON:  CT 09/03/2010 and 09/26/2007. FINDINGS: Lower chest: Clear lung bases. No significant pleural or pericardial effusion. Hepatobiliary: Scattered small, low-density hepatic lesions are similar to the previous study. No worrisome hepatic findings. No significant biliary dilatation post cholecystectomy. Pancreas: Unremarkable. No pancreatic  ductal dilatation or surrounding inflammatory changes. Spleen: Normal in size without focal abnormality. Adrenals/Urinary Tract: Left adrenal nodule measuring 2.4 x 2.0 cm (image 21) has mildly enlarged compared with the prior study at which time it measured 2.0 x 1.6 cm. The right adrenal gland appears normal. The kidneys appear normal without evidence of urinary tract calculus, suspicious lesion or hydronephrosis. No bladder abnormalities are seen. Stomach/Bowel: Small hiatal hernia. The stomach, small bowel and proximal colon appear normal. There are diverticular changes of the descending and sigmoid colon. There is sigmoid colon wall thickening and surrounding inflammation. In addition, there is a small amount of surrounding extraluminal fluid and air (images 64-71). No drainable fluid collection or free air. No evidence of bowel obstruction. Vascular/Lymphatic: There are no enlarged abdominal or pelvic lymph nodes. Stable minimal aortic and branch vessel atherosclerosis. Reproductive: The uterus and adnexa appear unremarkable. Other: No evidence of abdominal wall mass or hernia. No ascites. Musculoskeletal: No acute or significant osseous findings. There is multilevel lumbar spondylosis. There is asymmetric left hip arthropathy. IMPRESSION: 1. Acute sigmoid diverticulitis with small contained micro perforation manifesting as adjacent extraluminal air and fluid. No drainable fluid collection or free air. 2. Although a left adrenal nodule has mildly enlarged from 2012, this has been present since at least  2009 and is still likely an adenoma. 3. Stable small low-density hepatic lesions. 4. These results will be called to the ordering clinician or representative by the Radiologist Assistant, and communication documented in the PACS or zVision Dashboard. Electronically Signed   By: Richardean Sale M.D.   On: 07/05/2016 13:46    ____________________________________________   PROCEDURES  Procedure(s)  performed:   Procedures  None ____________________________________________   INITIAL IMPRESSION / ASSESSMENT AND PLAN / ED COURSE  Pertinent labs & imaging results that were available during my care of the patient were reviewed by me and considered in my medical decision making (see chart for details).  Patient presents from outpatient provider office with lower abdominal pain and CT showing diverticulitis with microperforation. Labs reviewed from PCP drawn today. Will add lactate and discuss with general surgery and internal medicine. Will start IVF and Zosyn.   03:54 PM Spoke with Gen Surg Dr. Kieth Brightly who agrees with antibiotics and admission for serial abdominal exams and pain control. Agrees with Zosyn. They will see the patient in consultation. Recommending hospitalist admission.  03:58 PM Discussed patient's case with hospitalist, Dr. Ree Kida. Patient and family (if present) updated with plan. Care transferred to hospitalist service.  I reviewed all nursing notes, vitals, pertinent old records, EKGs, labs, imaging (as available).  ____________________________________________  FINAL CLINICAL IMPRESSION(S) / ED DIAGNOSES  Final diagnoses:  Diverticulitis     MEDICATIONS GIVEN DURING THIS VISIT:  Medications  acetaminophen (TYLENOL) tablet 650 mg (not administered)  HYDROcodone-acetaminophen (NORCO/VICODIN) 5-325 MG per tablet 1-2 tablet (2 tablets Oral Given 07/05/16 1850)  morphine 4 MG/ML injection 1-3 mg (not administered)  0.9 %  sodium chloride infusion ( Intravenous New Bag/Given 07/05/16 1851)  piperacillin-tazobactam (ZOSYN) IVPB 3.375 g (not administered)  calcium-vitamin D (OSCAL WITH D) 500-200 MG-UNIT per tablet 2 tablet (not administered)  vitamin B-12 (CYANOCOBALAMIN) tablet 1,000 mcg (not administered)  metroNIDAZOLE (METROGEL) 3.33 % gel 1 application (not administered)  ondansetron (ZOFRAN) tablet 4 mg (not administered)    Or  ondansetron (ZOFRAN)  injection 4 mg (not administered)  sodium chloride 0.9 % bolus 1,000 mL (0 mLs Intravenous Stopped 07/05/16 1638)  acetaminophen (TYLENOL) tablet 1,000 mg (1,000 mg Oral Given 07/05/16 1534)  piperacillin-tazobactam (ZOSYN) IVPB 3.375 g (0 g Intravenous Stopped 07/05/16 1638)     NEW OUTPATIENT MEDICATIONS STARTED DURING THIS VISIT:  None   Note:  This document was prepared using Dragon voice recognition software and may include unintentional dictation errors.  Nanda Quinton, MD Emergency Medicine  Tyiesha Brackney, [facility], MD 07/05/16 [phone]

## 2016-07-05 NOTE — ED Triage Notes (Signed)
Patient reports that she saw her MD this am  And was sent for an outpatient CT - reports that she was told that she is having Diverticulitis with a Micro burst

## 2016-07-05 NOTE — H&P (Signed)
History and Physical    Tracy [name] QMG:[id number] DOB: Nov 29, 1950 DOA: 07/05/2016  PCP: Tracy Minium, MD   Patient coming from: Home, by way of [facility]   Chief Complaint: Fever, chills, abd pain   HPI: Tracy [name] is a 66 y.o. female with medical history significant for GERD and diverticulosis, presenting to the [facility] ED for evaluation of fevers, chills, headache, and abdominal pain which began the day prior. Patient reports that she had been in her usual state of fairly good health until yesterday when she developed fevers, chills, headache, and general malaise. There was some mild lower abdominal tenderness at that time, but the patient went to sleep, hoping that the symptoms would resolve overnight. Unfortunately, upon waking this morning, abdominal pain had worsened and she developed recurrent fevers and chills. She describes her pain as severe, localized to the lower abdomen near the midline, constant, sharp and cramping, associated with 3 loose stools this morning, but no melena or hematochezia. She has not been vomiting. She denies any chest pain, palpitations, dyspnea, or cough. She reports experiencing acute diverticulitis several years ago and describes a current symptoms as similar. Patient saw her primary care physician for evaluation of these complaints, was sent for CT of the abdomen and pelvis, noted to have a sigmoid diverticulitis with microperforation, and was directed to the ED for further evaluation.  [facility] ED Course: Upon arrival to the [facility] ED, patient is found to be afebrile, saturating well on room air, with soft blood pressure, vitals otherwise stable. Chemstrip panels notable only for an elevated BUN to creatinine ratio and CBC features a mild leukocytosis with WBC 11,200. CT of the abdomen and pelvis features acute sigmoid diverticulitis with small contained microperforation and no drainable fluid collection or free air. Blood cultures were  obtained, 1 L normal saline was given, Tylenol was administered, patient was started on empiric Zosyn, and general surgery was consulted by the ED physician. Surgery agreed to see the patient in consultation at [facility], but advise for a medical admission. Patient remained hemodynamically stable and in no apparent respiratory distress in the ED and she will be admitted to the medical/surgical unit for ongoing evaluation and management of acute sigmoid diverticulitis, complicated by microperforation.  Review of Systems:  All other systems reviewed and apart from HPI, are negative.  Past Medical History:  Diagnosis Date  . Anxiety   . Arthritis   . Bursitis of left hip   . Cancer (HCC)    basil cell  . Complication of anesthesia   . Diverticulitis   . GERD (gastroesophageal reflux disease)   . Hx: UTI (urinary tract infection)   . Hyperlipidemia   . Hyperthyroidism   . Osteopenia   . PONV (postoperative nausea and vomiting)   . Rosacea     Past Surgical History:  Procedure Laterality Date  . APPENDECTOMY    . BACK SURGERY    . CHOLECYSTECTOMY    . FOOT SURGERY    . KNEE SURGERY    . LAPAROSCOPIC LIVER CYST FENESTRATION    . OPEN SURGICAL REPAIR OF GLUTEAL TENDON Left 04/01/2014   Procedure: LEFT HIP BURSECTOMY WITH GLUTEAL TENDON REPAIR;  Surgeon: Gearlean Alf, MD;  Location: [facility] ORS;  Service: Orthopedics;  Laterality: Left;  . TONSILLECTOMY    . TOTAL KNEE ARTHROPLASTY Right 08/17/2015   Procedure: RIGHT TOTAL KNEE ARTHROPLASTY;  Surgeon: Gaynelle Arabian, MD;  Location: [facility] ORS;  Service: Orthopedics;  Laterality:  Right;  Marland Kitchen TUBAL LIGATION       reports that she has never smoked. She has never used smokeless tobacco. She reports that she drinks alcohol. She reports that she does not use drugs.  Allergies  Allergen Reactions  . Demerol [Meperidine] Nausea And Vomiting  . Latex Rash and Other (See Comments)    Reaction:  Blisters     Family History  Problem  Relation Age of Onset  . Hypertension Mother   . Stroke Mother   . Cancer Neg Hx   . Diabetes Neg Hx      Prior to Admission medications   Medication Sig Start Date End Date Taking? Authorizing Provider  Calcium Carbonate-Vitamin D (CALCIUM 600+D) 600-400 MG-UNIT tablet Take 2 tablets by mouth 2 (two) times daily.   Yes [provider]  cholecalciferol (VITAMIN D) 1000 units tablet Take 1,000 Units by mouth daily.   Yes [provider]  fenofibrate 160 MG tablet Take 1 tablet (160 mg total) by mouth daily. 05/23/16  Yes Tracy Minium, MD  Melatonin 5 MG TABS Take 5 mg by mouth at bedtime as needed (for sleep).   Yes [provider]  metroNIDAZOLE (METROGEL) 0.75 % gel Apply 1 application topically daily as needed (for rosacea).    Yes [provider]  omeprazole (PRILOSEC) 40 MG capsule Take 1 capsule (40 mg total) by mouth daily. 05/26/16  Yes Tracy Minium, MD  psyllium (METAMUCIL) 58.6 % powder Take 1 packet by mouth daily.   Yes [provider]  vitamin B-12 (CYANOCOBALAMIN) 1000 MCG tablet Take 1,000 mcg by mouth daily.   Yes [provider]    Physical Exam: Vitals:   07/05/16 1545 07/05/16 1600 07/05/16 1650 07/05/16 1815  BP: (!) 108/97 124/74 124/60 119/72  Pulse: 80 78 78 75  Resp: 12 17 16 18   Temp:   98.2 F (36.8 C) 98.7 F (37.1 C)  TempSrc:   Oral Oral  SpO2: 98% 96% 97% 98%  Weight:    80.1 kg (176 lb 9.4 oz)  Height:    5\' 6"  (1.676 m)      Constitutional: No respiratory distress. In apparent discomfort. No pallor, no diaphoresis.  Eyes: PERTLA, lids and conjunctivae normal ENMT: Mucous membranes are moist. Posterior pharynx clear of any exudate or lesions.   Neck: normal, supple, no masses, no thyromegaly Respiratory: clear to auscultation bilaterally, no wheezing, no crackles. Normal respiratory effort.  Cardiovascular: S1 & S2 heard, regular rate and rhythm. No extremity edema. No significant  JVD. Abdomen: No distension, soft, tender in lower quadrants Lt > Rt. No masses palpated. Bowel sounds active.  Musculoskeletal: no clubbing / cyanosis. No joint deformity upper and lower extremities.  Skin: no significant rashes, lesions, ulcers. Warm, dry, well-perfused. Neurologic: CN 2-12 grossly intact. Sensation intact, DTR normal. Strength 5/5 in all 4 limbs.  Psychiatric: Alert and oriented x 3. Normal mood and affect.     Labs on Admission: I have personally reviewed following labs and imaging studies  CBC:  Recent Labs Lab 07/05/16 1019  WBC 11.2*  NEUTROABS 9.2*  HGB 14.0  HCT 42.0  MCV 90.3  PLT 790.2   Basic Metabolic Panel:  Recent Labs Lab 07/05/16 1019  NA 140  K 4.0  CL 104  CO2 29  GLUCOSE 96  BUN 26*  CREATININE 0.86  CALCIUM 9.9   GFR: Estimated Creatinine Clearance: 66.8 mL/min (by C-G formula based on SCr of 0.86 mg/dL). Liver Function Tests:  Recent Labs Lab 07/05/16 1019  AST 22  ALT 16  ALKPHOS 67  BILITOT 0.8  PROT 6.9  ALBUMIN 4.4   No results for input(s): LIPASE, AMYLASE in the last 168 hours. No results for input(s): AMMONIA in the last 168 hours. Coagulation Profile: No results for input(s): INR, PROTIME in the last 168 hours. Cardiac Enzymes: No results for input(s): CKTOTAL, CKMB, CKMBINDEX, TROPONINI in the last 168 hours. BNP (last 3 results) No results for input(s): PROBNP in the last 8760 hours. HbA1C: No results for input(s): HGBA1C in the last 72 hours. CBG: No results for input(s): GLUCAP in the last 168 hours. Lipid Profile: No results for input(s): CHOL, HDL, LDLCALC, TRIG, CHOLHDL, LDLDIRECT in the last 72 hours. Thyroid Function Tests: No results for input(s): TSH, T4TOTAL, FREET4, T3FREE, THYROIDAB in the last 72 hours. Anemia Panel: No results for input(s): VITAMINB12, FOLATE, FERRITIN, TIBC, IRON, RETICCTPCT in the last 72 hours. Urine analysis:    Component Value Date/Time   COLORURINE YELLOW  08/10/2015 1100   APPEARANCEUR CLEAR 08/10/2015 1100   LABSPEC 1.025 08/10/2015 1100   PHURINE 6.0 08/10/2015 1100   GLUCOSEU NEGATIVE 08/10/2015 1100   GLUCOSEU NEGATIVE 12/09/2014 0949   HGBUR NEGATIVE 08/10/2015 1100   BILIRUBINUR negative 03/21/2016 1519   BILIRUBINUR Negative 11/27/2012 1516   KETONESUR NEGATIVE 08/10/2015 1100   PROTEINUR negative 03/21/2016 1519   PROTEINUR NEGATIVE 08/10/2015 1100   UROBILINOGEN 0.2 03/21/2016 1519   UROBILINOGEN 0.2 12/09/2014 0949   NITRITE negative 03/21/2016 1519   NITRITE NEGATIVE 08/10/2015 1100   LEUKOCYTESUR Negative 03/21/2016 1519   Sepsis Labs: @LABRCNTIP (procalcitonin:4,lacticidven:4) )No results found for this or any previous visit (from the past 240 hour(s)).   Radiological Exams on Admission: Ct Abdomen Pelvis W Contrast  Result Date: 07/05/2016 CLINICAL DATA:  Diffuse low abdominal pain for 1 day with fever. History of diverticulitis and ischemic colitis. EXAM: CT ABDOMEN AND PELVIS WITH CONTRAST TECHNIQUE: Multidetector CT imaging of the abdomen and pelvis was performed using the standard protocol following bolus administration of intravenous contrast. CONTRAST:  154mL ISOVUE-300 IOPAMIDOL (ISOVUE-300) INJECTION 61% COMPARISON:  CT 09/03/2010 and 09/26/2007. FINDINGS: Lower chest: Clear lung bases. No significant pleural or pericardial effusion. Hepatobiliary: Scattered small, low-density hepatic lesions are similar to the previous study. No worrisome hepatic findings. No significant biliary dilatation post cholecystectomy. Pancreas: Unremarkable. No pancreatic ductal dilatation or surrounding inflammatory changes. Spleen: Normal in size without focal abnormality. Adrenals/Urinary Tract: Left adrenal nodule measuring 2.4 x 2.0 cm (image 21) has mildly enlarged compared with the prior study at which time it measured 2.0 x 1.6 cm. The right adrenal gland appears normal. The kidneys appear normal without evidence of urinary tract  calculus, suspicious lesion or hydronephrosis. No bladder abnormalities are seen. Stomach/Bowel: Small hiatal hernia. The stomach, small bowel and proximal colon appear normal. There are diverticular changes of the descending and sigmoid colon. There is sigmoid colon wall thickening and surrounding inflammation. In addition, there is a small amount of surrounding extraluminal fluid and air (images 64-71). No drainable fluid collection or free air. No evidence of bowel obstruction. Vascular/Lymphatic: There are no enlarged abdominal or pelvic lymph nodes. Stable minimal aortic and branch vessel atherosclerosis. Reproductive: The uterus and adnexa appear unremarkable. Other: No evidence of abdominal wall mass or hernia. No ascites. Musculoskeletal: No acute or significant osseous findings. There is multilevel lumbar spondylosis. There is asymmetric left hip arthropathy. IMPRESSION: 1. Acute sigmoid diverticulitis with small contained micro perforation manifesting as adjacent extraluminal air and  fluid. No drainable fluid collection or free air. 2. Although a left adrenal nodule has mildly enlarged from 2012, this has been present since at least 2009 and is still likely an adenoma. 3. Stable small low-density hepatic lesions. 4. These results will be called to the ordering clinician or representative by the Radiologist Assistant, and communication documented in the PACS or zVision Dashboard. Electronically Signed   By: Richardean Sale M.D.   On: 07/05/2016 13:46    EKG: Not performed.   Assessment/Plan  1. Sigmoid diverticulitis with contained perforation  - Pt presents with 1 day of fever, chills, lower abdominal pain, no melena or hematochezia  - Found to have acute sigmoid diverticulitis with small contained perforation  - Blood cultures obtained, 1 L on NS given, and empiric Zosyn initiated in ED  - Gen surgery is consulting and much appreciated, will follow-up on recommendations - Plan to continue  conservative mgmt with IVF, empiric abx, bowel rest, analgesia, and antiemetics   2. GERD - No EGD report on file  - Managed with daily PPI at home, currently NPO, will give IV for now    DVT prophylaxis: SCD's  Code Status: Full  Family Communication: Discussed with patient Disposition Plan: Observe on med-surg Consults called: General surgery Admission status: Observation    Vianne Bulls, MD [facility] Pager [phone]  If 7PM-7AM, please contact night-coverage [email or web address] Password [facility]  07/05/2016, 7:31 PM

## 2016-07-05 NOTE — Progress Notes (Signed)
Pre visit review using our clinic review tool, if applicable. No additional management support is needed unless otherwise documented below in the visit note. 

## 2016-07-06 DIAGNOSIS — Z96651 Presence of right artificial knee joint: Secondary | ICD-10-CM | POA: Diagnosis present

## 2016-07-06 DIAGNOSIS — Z91040 Latex allergy status: Secondary | ICD-10-CM | POA: Diagnosis not present

## 2016-07-06 DIAGNOSIS — E0590 Thyrotoxicosis, unspecified without thyrotoxic crisis or storm: Secondary | ICD-10-CM | POA: Diagnosis present

## 2016-07-06 DIAGNOSIS — Z9049 Acquired absence of other specified parts of digestive tract: Secondary | ICD-10-CM | POA: Diagnosis not present

## 2016-07-06 DIAGNOSIS — K5720 Diverticulitis of large intestine with perforation and abscess without bleeding: Secondary | ICD-10-CM | POA: Diagnosis not present

## 2016-07-06 DIAGNOSIS — Z8249 Family history of ischemic heart disease and other diseases of the circulatory system: Secondary | ICD-10-CM | POA: Diagnosis not present

## 2016-07-06 DIAGNOSIS — E876 Hypokalemia: Secondary | ICD-10-CM | POA: Diagnosis present

## 2016-07-06 DIAGNOSIS — E785 Hyperlipidemia, unspecified: Secondary | ICD-10-CM | POA: Diagnosis present

## 2016-07-06 DIAGNOSIS — A0472 Enterocolitis due to Clostridium difficile, not specified as recurrent: Secondary | ICD-10-CM | POA: Diagnosis not present

## 2016-07-06 DIAGNOSIS — Z79899 Other long term (current) drug therapy: Secondary | ICD-10-CM | POA: Diagnosis not present

## 2016-07-06 DIAGNOSIS — R109 Unspecified abdominal pain: Secondary | ICD-10-CM | POA: Diagnosis not present

## 2016-07-06 DIAGNOSIS — F419 Anxiety disorder, unspecified: Secondary | ICD-10-CM | POA: Diagnosis present

## 2016-07-06 DIAGNOSIS — K5792 Diverticulitis of intestine, part unspecified, without perforation or abscess without bleeding: Secondary | ICD-10-CM | POA: Diagnosis present

## 2016-07-06 DIAGNOSIS — Z888 Allergy status to other drugs, medicaments and biological substances status: Secondary | ICD-10-CM | POA: Diagnosis not present

## 2016-07-06 DIAGNOSIS — K219 Gastro-esophageal reflux disease without esophagitis: Secondary | ICD-10-CM | POA: Diagnosis not present

## 2016-07-06 DIAGNOSIS — Z85828 Personal history of other malignant neoplasm of skin: Secondary | ICD-10-CM | POA: Diagnosis not present

## 2016-07-06 LAB — COMPREHENSIVE METABOLIC PANEL
ALT: 16 U/L (ref 14–54)
ANION GAP: 13 (ref 5–15)
AST: 23 U/L (ref 15–41)
Albumin: 3.5 g/dL (ref 3.5–5.0)
Alkaline Phosphatase: 59 U/L (ref 38–126)
BUN: 18 mg/dL (ref 6–20)
CO2: 18 mmol/L — AB (ref 22–32)
Calcium: 8.6 mg/dL — ABNORMAL LOW (ref 8.9–10.3)
Chloride: 107 mmol/L (ref 101–111)
Creatinine, Ser: 0.87 mg/dL (ref 0.44–1.00)
GFR calc non Af Amer: >60 mL/min (ref >60)
Glucose, Bld: 72 mg/dL (ref 65–99)
Potassium: 3.7 mmol/L (ref 3.5–5.1)
SODIUM: 138 mmol/L (ref 135–145)
Total Bilirubin: 1.3 mg/dL — ABNORMAL HIGH (ref 0.3–1.2)
Total Protein: 6.3 g/dL — ABNORMAL LOW (ref 6.5–8.1)

## 2016-07-06 LAB — CBC WITH DIFFERENTIAL/PLATELET
Basophils Absolute: 0 10*3/uL (ref 0.0–0.1)
Basophils Relative: 0 %
EOS ABS: 0.1 10*3/uL (ref 0.0–0.7)
EOS PCT: 1 %
HCT: 36.5 % (ref 36.0–46.0)
Hemoglobin: 12.3 g/dL (ref 12.0–15.0)
LYMPHS ABS: 0.8 10*3/uL (ref 0.7–4.0)
Lymphocytes Relative: 9 %
MCH: 30.4 pg (ref 26.0–34.0)
MCHC: 33.7 g/dL (ref 30.0–36.0)
MCV: 90.1 fL (ref 78.0–100.0)
MONOS PCT: 6 %
Monocytes Absolute: 0.6 10*3/uL (ref 0.1–1.0)
Neutro Abs: 7.9 10*3/uL — ABNORMAL HIGH (ref 1.7–7.7)
Neutrophils Relative %: 84 %
PLATELETS: 188 10*3/uL (ref 150–400)
RBC: 4.05 MIL/uL (ref 3.87–5.11)
RDW: 13.5 % (ref 11.5–15.5)
WBC: 9.4 10*3/uL (ref 4.0–10.5)

## 2016-07-06 LAB — URINALYSIS, ROUTINE W REFLEX MICROSCOPIC
Bilirubin Urine: NEGATIVE
Glucose, UA: NEGATIVE mg/dL
Hgb urine dipstick: NEGATIVE
Ketones, ur: 80 mg/dL — AB
LEUKOCYTES UA: NEGATIVE
Nitrite: NEGATIVE
PROTEIN: NEGATIVE mg/dL
Specific Gravity, Urine: 1.024 (ref 1.005–1.030)
pH: 5 (ref 5.0–8.0)

## 2016-07-06 LAB — PROTIME-INR
INR: 1.13
Prothrombin Time: 14.5 seconds (ref 11.4–15.2)

## 2016-07-06 MED ORDER — DEXTROSE-NACL 5-0.9 % IV SOLN
INTRAVENOUS | Status: DC
  Administered 2016-07-06: 1000 mL via INTRAVENOUS
  Administered 2016-07-07: 05:00:00 via INTRAVENOUS

## 2016-07-06 MED ORDER — ENOXAPARIN SODIUM 40 MG/0.4ML ~~LOC~~ SOLN
40.0000 mg | SUBCUTANEOUS | Status: DC
  Administered 2016-07-06 – 2016-07-16 (×11): 40 mg via SUBCUTANEOUS
  Filled 2016-07-06 (×11): qty 0.4

## 2016-07-06 MED ORDER — CALCIUM CARBONATE-VITAMIN D 500-200 MG-UNIT PO TABS
1.0000 | ORAL_TABLET | Freq: Two times a day (BID) | ORAL | Status: DC
  Administered 2016-07-06 – 2016-07-17 (×22): 1 via ORAL
  Filled 2016-07-06 (×22): qty 1

## 2016-07-06 MED ORDER — LIP MEDEX EX OINT
TOPICAL_OINTMENT | CUTANEOUS | Status: AC
  Administered 2016-07-06: 09:00:00
  Filled 2016-07-06: qty 7

## 2016-07-06 MED ORDER — PANTOPRAZOLE SODIUM 40 MG IV SOLR
40.0000 mg | Freq: Every day | INTRAVENOUS | Status: DC
  Administered 2016-07-06 (×2): 40 mg via INTRAVENOUS
  Filled 2016-07-06 (×2): qty 40

## 2016-07-06 NOTE — Progress Notes (Signed)
PROGRESS NOTE    Tracy [name]  YDX:[id number] DOB: 02-27-1951 DOA: 07/05/2016 PCP: [name], MD    Brief Narrative:  Tracy [name] is a 66 y.o. female with medical history significant for GERD and diverticulosis, presenting to the [facility] ED for evaluation of fevers, chills, headache, and abdominal pain which began the day prior. Patient reports that she had been in her usual state of fairly good health until yesterday when she developed fevers, chills, headache, and general malaise. There was some mild lower abdominal tenderness at that time, but the patient went to sleep, hoping that the symptoms would resolve overnight. Unfortunately, upon waking this morning, abdominal pain had worsened and she developed recurrent fevers and chills. She describes her pain as severe, localized to the lower abdomen near the midline, constant, sharp and cramping, associated with 3 loose stools this morning, but no melena or hematochezia. She has not been vomiting. She denies any chest pain, palpitations, dyspnea, or cough. She reports experiencing acute diverticulitis several years ago and describes a current symptoms as similar. Patient saw her primary care physician for evaluation of these complaints, was sent for CT of the abdomen and pelvis, noted to have a sigmoid diverticulitis with microperforation, and was directed to the ED for further evaluation.   Assessment & Plan:   Principal Problem:   Perforation of sigmoid colon due to diverticulitis Active Problems:   GERD   1-Acute diverticulitis with microperforation;  Still with abdominal pain.  Will continue with IV fluids. Continue with IV antibiotics, IV pain meds.  Appreciate Surgery evaluation.  NPO.  WBC decreased from 11 to 9.  Follow blood culture.   2-GERD; PPI.      DVT prophylaxis: start lovenox,.  Code Status: full code.  Family Communication: Husband at bedside,.  Disposition Plan: remain in the hospital for IV  antibiotics.    Consultants:   Surgery    Procedures:  none   Antimicrobials:   Zosyn 5-08   Subjective: She still report pain as severe 7/10, similar than yesterday.  Passing gas. No BM.    Objective: Vitals:   07/05/16 1815 07/05/16 2111 07/06/16 0503 07/06/16 1309  BP: 119/72  140/66 (!) 101/54  Pulse: 75 71 85 75  Resp: 18 18 18 16   Temp: 98.7 F (37.1 C) 98.7 F (37.1 C) 98.8 F (37.1 C) 98.8 F (37.1 C)  TempSrc: Oral Oral Oral Oral  SpO2: 98% 95% 96% 96%  Weight: 80.1 kg (176 lb 9.4 oz)     Height: 5\' 6"  (1.676 m)       Intake/Output Summary (Last 24 hours) at 07/06/16 1542 Last data filed at 07/06/16 1219  Gross per 24 hour  Intake             2180 ml  Output                0 ml  Net             2180 ml   Filed Weights   07/05/16 1419 07/05/16 1815  Weight: 76.2 kg (168 lb) 80.1 kg (176 lb 9.4 oz)    Examination:  General exam: Appears calm and comfortable  Respiratory system: Clear to auscultation. Respiratory effort normal. Cardiovascular system: S1 & S2 heard, RRR. No JVD, murmurs, rubs, gallops or clicks. No pedal edema. Gastrointestinal system: Abdomen is nondistended, soft and tenderness lower quadrant. No organomegaly or masses felt. Normal bowel sounds heard. Central nervous system: Alert and oriented. No focal  neurological deficits. Extremities: Symmetric 5 x 5 power. Skin: No rashes, lesions or ulcers Psychiatry: Judgement and insight appear normal. Mood & affect appropriate.     Data Reviewed: I have personally reviewed following labs and imaging studies  CBC:  Recent Labs Lab 07/05/16 1019 07/06/16 0544  WBC 11.2* 9.4  NEUTROABS 9.2* 7.9*  HGB 14.0 12.3  HCT 42.0 36.5  MCV 90.3 90.1  PLT 238.0 027   Basic Metabolic Panel:  Recent Labs Lab 07/05/16 1019 07/06/16 0544  NA 140 138  K 4.0 3.7  CL 104 107  CO2 29 18*  GLUCOSE 96 72  BUN 26* 18  CREATININE 0.86 0.87  CALCIUM 9.9 8.6*   GFR: Estimated  Creatinine Clearance: 66 mL/min (by C-G formula based on SCr of 0.87 mg/dL). Liver Function Tests:  Recent Labs Lab 07/05/16 1019 07/06/16 0544  AST 22 23  ALT 16 16  ALKPHOS 67 59  BILITOT 0.8 1.3*  PROT 6.9 6.3*  ALBUMIN 4.4 3.5   No results for input(s): LIPASE, AMYLASE in the last 168 hours. No results for input(s): AMMONIA in the last 168 hours. Coagulation Profile:  Recent Labs Lab 07/06/16 0544  INR 1.13   Cardiac Enzymes: No results for input(s): CKTOTAL, CKMB, CKMBINDEX, TROPONINI in the last 168 hours. BNP (last 3 results) No results for input(s): PROBNP in the last 8760 hours. HbA1C: No results for input(s): HGBA1C in the last 72 hours. CBG: No results for input(s): GLUCAP in the last 168 hours. Lipid Profile: No results for input(s): CHOL, HDL, LDLCALC, TRIG, CHOLHDL, LDLDIRECT in the last 72 hours. Thyroid Function Tests: No results for input(s): TSH, T4TOTAL, FREET4, T3FREE, THYROIDAB in the last 72 hours. Anemia Panel: No results for input(s): VITAMINB12, FOLATE, FERRITIN, TIBC, IRON, RETICCTPCT in the last 72 hours. Sepsis Labs:  Recent Labs Lab 07/05/16 1545  LATICACIDVEN 0.67    Recent Results (from the past 240 hour(s))  Culture, blood (routine x 2)     Status: None (Preliminary result)   Collection Time: 07/05/16  3:40 PM  Result Value Ref Range Status   Specimen Description BLOOD LEFT ANTECUBITAL  Final   Special Requests   Final    BOTTLES DRAWN AEROBIC AND ANAEROBIC Blood Culture adequate volume   Culture   Final    NO GROWTH < 24 HOURS Performed at [facility], [place] [address] [place], [place] [address]    Report Status PENDING  Incomplete  Culture, blood (routine x 2)     Status: None (Preliminary result)   Collection Time: 07/05/16  3:45 PM  Result Value Ref Range Status   Specimen Description BLOOD BLOOD LEFT HAND  Final   Special Requests   Final    BOTTLES DRAWN AEROBIC AND ANAEROBIC Blood Culture adequate volume    Culture   Final    NO GROWTH < 24 HOURS Performed at [facility], [place] [address], [place], [place] [address]    Report Status PENDING  Incomplete         Radiology Studies: Ct Abdomen Pelvis W Contrast  Result Date: 07/05/2016 CLINICAL DATA:  Diffuse low abdominal pain for 1 day with fever. History of diverticulitis and ischemic colitis. EXAM: CT ABDOMEN AND PELVIS WITH CONTRAST TECHNIQUE: Multidetector CT imaging of the abdomen and pelvis was performed using the standard protocol following bolus administration of intravenous contrast. CONTRAST:  164mL ISOVUE-300 IOPAMIDOL (ISOVUE-300) INJECTION 61% COMPARISON:  CT 09/03/2010 and 09/26/2007. FINDINGS: Lower chest: Clear lung bases. No  significant pleural or pericardial effusion. Hepatobiliary: Scattered small, low-density hepatic lesions are similar to the previous study. No worrisome hepatic findings. No significant biliary dilatation post cholecystectomy. Pancreas: Unremarkable. No pancreatic ductal dilatation or surrounding inflammatory changes. Spleen: Normal in size without focal abnormality. Adrenals/Urinary Tract: Left adrenal nodule measuring 2.4 x 2.0 cm (image 21) has mildly enlarged compared with the prior study at which time it measured 2.0 x 1.6 cm. The right adrenal gland appears normal. The kidneys appear normal without evidence of urinary tract calculus, suspicious lesion or hydronephrosis. No bladder abnormalities are seen. Stomach/Bowel: Small hiatal hernia. The stomach, small bowel and proximal colon appear normal. There are diverticular changes of the descending and sigmoid colon. There is sigmoid colon wall thickening and surrounding inflammation. In addition, there is a small amount of surrounding extraluminal fluid and air (images 64-71). No drainable fluid collection or free air. No evidence of bowel obstruction. Vascular/Lymphatic: There are no enlarged abdominal or pelvic lymph nodes. Stable minimal aortic and branch  vessel atherosclerosis. Reproductive: The uterus and adnexa appear unremarkable. Other: No evidence of abdominal wall mass or hernia. No ascites. Musculoskeletal: No acute or significant osseous findings. There is multilevel lumbar spondylosis. There is asymmetric left hip arthropathy. IMPRESSION: 1. Acute sigmoid diverticulitis with small contained micro perforation manifesting as adjacent extraluminal air and fluid. No drainable fluid collection or free air. 2. Although a left adrenal nodule has mildly enlarged from 2012, this has been present since at least 2009 and is still likely an adenoma. 3. Stable small low-density hepatic lesions. 4. These results will be called to the ordering clinician or representative by the Radiologist Assistant, and communication documented in the PACS or zVision Dashboard. Electronically Signed   By: Richardean Sale M.D.   On: 07/05/2016 13:46        Scheduled Meds: . calcium-vitamin D  2 tablet Oral BID  . pantoprazole (PROTONIX) IV  40 mg Intravenous QHS  . vitamin B-12  1,000 mcg Oral Daily   Continuous Infusions: . dextrose 5 % and 0.9% NaCl 1,000 mL (07/06/16 0837)  . piperacillin-tazobactam (ZOSYN)  IV Stopped (07/06/16 1447)     LOS: 0 days    Time spent: 35 minutes.     Elmarie Shiley, MD [facility] Pager [phone]  If 7PM-7AM, please contact night-coverage [email or web address] Password [email or web address] 07/06/2016, 3:42 PM

## 2016-07-06 NOTE — Care Management Note (Signed)
Case Management Note  Patient Details  Name: Tracy [name] MRN: [id number] Date of Birth: 01-22-1951  Subjective/Objective:          diverticulitis          Action/Plan: Date:  Jul 06, 2016 Chart reviewed for concurrent status and case management needs. Will continue to follow patient progress. Discharge Planning: following for needs Expected discharge date: [id number] Velva Harman, BSN, [place], [place]  Expected Discharge Date:                  Expected Discharge Plan:  Home/Self Care  In-House Referral:     Discharge planning Services  CM Consult  Post Acute Care Choice:    Choice offered to:     DME Arranged:    DME Agency:     HH Arranged:    HH Agency:     Status of Service:  In process, will continue to follow  If discussed at Long Length of Stay Meetings, dates discussed:    Additional Comments:  Leeroy Cha, RN 07/06/2016, 8:13 AM

## 2016-07-06 NOTE — Progress Notes (Signed)
Brief episode of nausea , hot flash after getting a dose of morphine for continued pain

## 2016-07-06 NOTE — Consult Note (Signed)
Reason for Consult: abdominal pain Referring Physician: Kalee, [name] is an 66 y.o. female.  HPI: The patient has a few day history of abdominal pain. She has nausea. No fevers today. She had one previous bought of diverticulitis 10 years ago and did not require a drain at that time. The pain is mainly in the lower abdomen. She denies constipation or diarrhea.  Past Medical History:  Diagnosis Date  . Anxiety   . Arthritis   . Bursitis of left hip   . Cancer (HCC)    basil cell  . Complication of anesthesia   . Diverticulitis   . GERD (gastroesophageal reflux disease)   . Hx: UTI (urinary tract infection)   . Hyperlipidemia   . Hyperthyroidism   . Osteopenia   . PONV (postoperative nausea and vomiting)   . Rosacea     Past Surgical History:  Procedure Laterality Date  . APPENDECTOMY    . BACK SURGERY    . CHOLECYSTECTOMY    . FOOT SURGERY    . KNEE SURGERY    . LAPAROSCOPIC LIVER CYST FENESTRATION    . OPEN SURGICAL REPAIR OF GLUTEAL TENDON Left 04/01/2014   Procedure: LEFT HIP BURSECTOMY WITH GLUTEAL TENDON REPAIR;  Surgeon: Gearlean Alf, MD;  Location: [facility];  Service: Orthopedics;  Laterality: Left;  . TONSILLECTOMY    . TOTAL KNEE ARTHROPLASTY Right 08/17/2015   Procedure: RIGHT TOTAL KNEE ARTHROPLASTY;  Surgeon: Gaynelle Arabian, MD;  Location: [facility];  Service: Orthopedics;  Laterality: Right;  . TUBAL LIGATION      Family History  Problem Relation Age of Onset  . Hypertension Mother   . Stroke Mother   . Cancer Neg Hx   . Diabetes Neg Hx     Social History:  reports that she has never smoked. She has never used smokeless tobacco. She reports that she drinks alcohol. She reports that she does not use drugs.  Allergies:  Allergies  Allergen Reactions  . Demerol [Meperidine] Nausea And Vomiting  . Latex Rash and Other (See Comments)    Reaction:  Blisters     Medications: I have reviewed the patient's current medications.  Results for  orders placed or performed during the hospital encounter of 07/05/16 (from the past 48 hour(s))  Culture, blood (routine x 2)     Status: None (Preliminary result)   Collection Time: 07/05/16  3:40 PM  Result Value Ref Range   Specimen Description BLOOD LEFT ANTECUBITAL    Special Requests      BOTTLES DRAWN AEROBIC AND ANAEROBIC Blood Culture adequate volume   Culture      NO GROWTH < 24 HOURS Performed at [facility] [address], [place], [place] [address]    Report Status PENDING   I-Stat CG4 Lactic Acid, ED     Status: None   Collection Time: 07/05/16  3:45 PM  Result Value Ref Range   Lactic Acid, Venous 0.67 0.5 - 1.9 mmol/L  Culture, blood (routine x 2)     Status: None (Preliminary result)   Collection Time: 07/05/16  3:45 PM  Result Value Ref Range   Specimen Description BLOOD BLOOD LEFT HAND    Special Requests      BOTTLES DRAWN AEROBIC AND ANAEROBIC Blood Culture adequate volume   Culture      NO GROWTH < 24 HOURS Performed at [facility], [place] [address] [place], [place] [address]    Report Status  PENDING   CBC WITH DIFFERENTIAL     Status: Abnormal   Collection Time: 07/06/16  5:44 AM  Result Value Ref Range   WBC 9.4 4.0 - 10.5 K/uL   RBC 4.05 3.87 - 5.11 MIL/uL   Hemoglobin 12.3 12.0 - 15.0 g/dL   HCT 36.5 36.0 - 46.0 %   MCV 90.1 78.0 - 100.0 fL   MCH 30.4 26.0 - 34.0 pg   MCHC 33.7 30.0 - 36.0 g/dL   RDW 13.5 11.5 - 15.5 %   Platelets 188 150 - 400 K/uL   Neutrophils Relative % 84 %   Neutro Abs 7.9 (H) 1.7 - 7.7 K/uL   Lymphocytes Relative 9 %   Lymphs Abs 0.8 0.7 - 4.0 K/uL   Monocytes Relative 6 %   Monocytes Absolute 0.6 0.1 - 1.0 K/uL   Eosinophils Relative 1 %   Eosinophils Absolute 0.1 0.0 - 0.7 K/uL   Basophils Relative 0 %   Basophils Absolute 0.0 0.0 - 0.1 K/uL  Comprehensive metabolic panel     Status: Abnormal   Collection Time: 07/06/16  5:44 AM  Result Value Ref Range   Sodium 138 135 - 145 mmol/L   Potassium 3.7  3.5 - 5.1 mmol/L   Chloride 107 101 - 111 mmol/L   CO2 18 (L) 22 - 32 mmol/L   Glucose, Bld 72 65 - 99 mg/dL   BUN 18 6 - 20 mg/dL   Creatinine, Ser 0.87 0.44 - 1.00 mg/dL   Calcium 8.6 (L) 8.9 - 10.3 mg/dL   Total Protein 6.3 (L) 6.5 - 8.1 g/dL   Albumin 3.5 3.5 - 5.0 g/dL   AST 23 15 - 41 U/L   ALT 16 14 - 54 U/L   Alkaline Phosphatase 59 38 - 126 U/L   Total Bilirubin 1.3 (H) 0.3 - 1.2 mg/dL   GFR calc non Af Amer >60 >60 mL/min   GFR calc Af Amer >60 >60 mL/min    Comment: (NOTE) The eGFR has been calculated using the CKD EPI equation. This calculation has not been validated in all clinical situations. eGFR's persistently <60 mL/min signify possible Chronic Kidney Disease.    Anion gap 13 5 - 15  Protime-INR     Status: None   Collection Time: 07/06/16  5:44 AM  Result Value Ref Range   Prothrombin Time 14.5 11.4 - 15.2 seconds   INR 1.13     Ct Abdomen Pelvis W Contrast  Result Date: 07/05/2016 CLINICAL DATA:  Diffuse low abdominal pain for 1 day with fever. History of diverticulitis and ischemic colitis. EXAM: CT ABDOMEN AND PELVIS WITH CONTRAST TECHNIQUE: Multidetector CT imaging of the abdomen and pelvis was performed using the standard protocol following bolus administration of intravenous contrast. CONTRAST:  135m ISOVUE-300 IOPAMIDOL (ISOVUE-300) INJECTION 61% COMPARISON:  CT 09/03/2010 and 09/26/2007. FINDINGS: Lower chest: Clear lung bases. No significant pleural or pericardial effusion. Hepatobiliary: Scattered small, low-density hepatic lesions are similar to the previous study. No worrisome hepatic findings. No significant biliary dilatation post cholecystectomy. Pancreas: Unremarkable. No pancreatic ductal dilatation or surrounding inflammatory changes. Spleen: Normal in size without focal abnormality. Adrenals/Urinary Tract: Left adrenal nodule measuring 2.4 x 2.0 cm (image 21) has mildly enlarged compared with the prior study at which time it measured 2.0 x 1.6 cm.  The right adrenal gland appears normal. The kidneys appear normal without evidence of urinary tract calculus, suspicious lesion or hydronephrosis. No bladder abnormalities are seen. Stomach/Bowel: Small hiatal hernia. The stomach,  small bowel and proximal colon appear normal. There are diverticular changes of the descending and sigmoid colon. There is sigmoid colon wall thickening and surrounding inflammation. In addition, there is a small amount of surrounding extraluminal fluid and air (images 64-71). No drainable fluid collection or free air. No evidence of bowel obstruction. Vascular/Lymphatic: There are no enlarged abdominal or pelvic lymph nodes. Stable minimal aortic and branch vessel atherosclerosis. Reproductive: The uterus and adnexa appear unremarkable. Other: No evidence of abdominal wall mass or hernia. No ascites. Musculoskeletal: No acute or significant osseous findings. There is multilevel lumbar spondylosis. There is asymmetric left hip arthropathy. IMPRESSION: 1. Acute sigmoid diverticulitis with small contained micro perforation manifesting as adjacent extraluminal air and fluid. No drainable fluid collection or free air. 2. Although a left adrenal nodule has mildly enlarged from 2012, this has been present since at least 2009 and is still likely an adenoma. 3. Stable small low-density hepatic lesions. 4. These results will be called to the ordering clinician or representative by the Radiologist Assistant, and communication documented in the PACS or zVision Dashboard. Electronically Signed   By: Richardean Sale M.D.   On: 07/05/2016 13:46    Review of Systems  Constitutional: Negative for chills and fever.  HENT: Negative for hearing loss.   Eyes: Negative for blurred vision and double vision.  Respiratory: Negative for cough and hemoptysis.   Cardiovascular: Negative for chest pain and palpitations.  Gastrointestinal: Positive for abdominal pain and nausea.  Genitourinary: Negative for  dysuria and urgency.  Musculoskeletal: Negative for myalgias and neck pain.  Skin: Negative for itching and rash.  Neurological: Negative for dizziness, tingling and headaches.  Endo/Heme/Allergies: Does not bruise/bleed easily.  Psychiatric/Behavioral: Negative for depression and suicidal ideas.   Blood pressure 140/66, pulse 85, temperature 98.8 F (37.1 C), temperature source Oral, resp. rate 18, height 5' 6"  (1.676 m), weight 80.1 kg (176 lb 9.4 oz), SpO2 96 %. Physical Exam  Vitals reviewed. Constitutional: She is oriented to person, place, and time. She appears well-developed and well-nourished.  HENT:  Head: Normocephalic and atraumatic.  Eyes: Conjunctivae and EOM are normal. Pupils are equal, round, and reactive to light.  Neck: Normal range of motion. Neck supple.  Cardiovascular: Normal rate and regular rhythm.   Respiratory: Effort normal and breath sounds normal.  GI: Soft. Bowel sounds are normal. She exhibits no distension. There is tenderness in the suprapubic area and left lower quadrant. There is guarding.  Musculoskeletal: Normal range of motion.  Neurological: She is alert and oriented to person, place, and time.  Skin: Skin is warm and dry.  Psychiatric: She has a normal mood and affect. Her behavior is normal.    Assessment/Plan: 66 yo female with abdominal pain, nausea, leukocytosis, and CT findings all consistent with diverticulitis. The CT scan has a few bubbles of possible extraluminal air, but no free air. There is no drainable fluid collection. -IV abx -bowel rest -will continue to follow -she has a high likelihood of success with nonoperative management  Tracy [name] 07/06/2016, 12:55 PM

## 2016-07-07 LAB — BASIC METABOLIC PANEL
Anion gap: 8 (ref 5–15)
BUN: 9 mg/dL (ref 6–20)
CALCIUM: 8.8 mg/dL — AB (ref 8.9–10.3)
CO2: 24 mmol/L (ref 22–32)
CREATININE: 0.73 mg/dL (ref 0.44–1.00)
Chloride: 107 mmol/L (ref 101–111)
Glucose, Bld: 152 mg/dL — ABNORMAL HIGH (ref 65–99)
Potassium: 3.4 mmol/L — ABNORMAL LOW (ref 3.5–5.1)
Sodium: 139 mmol/L (ref 135–145)

## 2016-07-07 LAB — CBC
HEMATOCRIT: 37.3 % (ref 36.0–46.0)
Hemoglobin: 12.7 g/dL (ref 12.0–15.0)
MCH: 30.2 pg (ref 26.0–34.0)
MCHC: 34 g/dL (ref 30.0–36.0)
MCV: 88.8 fL (ref 78.0–100.0)
PLATELETS: 184 10*3/uL (ref 150–400)
RBC: 4.2 MIL/uL (ref 3.87–5.11)
RDW: 13.2 % (ref 11.5–15.5)
WBC: 9.9 10*3/uL (ref 4.0–10.5)

## 2016-07-07 MED ORDER — KCL IN DEXTROSE-NACL 20-5-0.9 MEQ/L-%-% IV SOLN
INTRAVENOUS | Status: DC
  Administered 2016-07-07 – 2016-07-17 (×14): via INTRAVENOUS
  Filled 2016-07-07 (×27): qty 1000

## 2016-07-07 MED ORDER — FLUTICASONE PROPIONATE 50 MCG/ACT NA SUSP
1.0000 | Freq: Every day | NASAL | Status: DC
  Administered 2016-07-07 – 2016-07-17 (×10): 1 via NASAL
  Filled 2016-07-07: qty 16

## 2016-07-07 MED ORDER — POTASSIUM CHLORIDE 10 MEQ/100ML IV SOLN
10.0000 meq | INTRAVENOUS | Status: AC
  Administered 2016-07-07 (×3): 10 meq via INTRAVENOUS
  Filled 2016-07-07 (×3): qty 100

## 2016-07-07 MED ORDER — PANTOPRAZOLE SODIUM 40 MG PO TBEC
40.0000 mg | DELAYED_RELEASE_TABLET | Freq: Every day | ORAL | Status: DC
  Administered 2016-07-07 – 2016-07-15 (×9): 40 mg via ORAL
  Filled 2016-07-07 (×9): qty 1

## 2016-07-07 MED ORDER — LORATADINE 10 MG PO TABS
10.0000 mg | ORAL_TABLET | Freq: Every day | ORAL | Status: DC
  Administered 2016-07-07 – 2016-07-17 (×11): 10 mg via ORAL
  Filled 2016-07-07 (×11): qty 1

## 2016-07-07 MED ORDER — POTASSIUM CHLORIDE 2 MEQ/ML IV SOLN
INTRAVENOUS | Status: DC
  Filled 2016-07-07: qty 1000

## 2016-07-07 NOTE — Progress Notes (Signed)
PROGRESS NOTE    Tracy [name]  HYI:[id number] DOB: 19-Jan-1951 DOA: 07/05/2016 PCP: [name], MD    Brief Narrative:  Tracy [name] is a 66 y.o. female with medical history significant for GERD and diverticulosis, presenting to the [facility] ED for evaluation of fevers, chills, headache, and abdominal pain which began the day prior. Patient reports that she had been in her usual state of fairly good health until yesterday when she developed fevers, chills, headache, and general malaise. There was some mild lower abdominal tenderness at that time, but the patient went to sleep, hoping that the symptoms would resolve overnight. Unfortunately, upon waking this morning, abdominal pain had worsened and she developed recurrent fevers and chills. She describes her pain as severe, localized to the lower abdomen near the midline, constant, sharp and cramping, associated with 3 loose stools this morning, but no melena or hematochezia. She has not been vomiting. She denies any chest pain, palpitations, dyspnea, or cough. She reports experiencing acute diverticulitis several years ago and describes a current symptoms as similar. Patient saw her primary care physician for evaluation of these complaints, was sent for CT of the abdomen and pelvis, noted to have a sigmoid diverticulitis with microperforation, and was directed to the ED for further evaluation.   Assessment & Plan:   Principal Problem:   Perforation of sigmoid colon due to diverticulitis Active Problems:   GERD   Acute diverticulitis   1-Acute diverticulitis with microperforation;  Will continue with IV fluids. Continue with IV antibiotics, IV pain meds.  Appreciate Surgery evaluation.  NPO.  WBC stable at 9 Follow blood culture, no growth to date,. Report pain 3/10 after pain medication, some improvement since yesterday.   2-GERD; PPI.   3-Hypokalemia; replete IV and will add potasium to IV fluids.    DVT prophylaxis:   lovenox,.  Code Status: full code.  Family Communication: care discussed with patient.  Disposition Plan: remain in the hospital for IV antibiotics.    Consultants:   Surgery    Procedures:  none   Antimicrobials:   Zosyn 5-08   Subjective: Report abdominal pain at 3/10. After pain medication.  Denies nausea.  Passing gas, no BM. She has been walking in the hall.     Objective: Vitals:   07/06/16 1757 07/06/16 2018 07/07/16 0501 07/07/16 1359  BP:  127/66 (!) 161/79 115/84  Pulse:  80 87 86  Resp:  18 16 18   Temp: 99.9 F (37.7 C) 98.8 F (37.1 C) 98.6 F (37 C) 99 F (37.2 C)  TempSrc: Oral Oral Oral Oral  SpO2:  96% 100% 99%  Weight:      Height:        Intake/Output Summary (Last 24 hours) at 07/07/16 1453 Last data filed at 07/07/16 0753  Gross per 24 hour  Intake             2295 ml  Output                0 ml  Net             2295 ml   Filed Weights   07/05/16 1419 07/05/16 1815  Weight: 76.2 kg (168 lb) 80.1 kg (176 lb 9.4 oz)    Examination:  General exam: NAD Respiratory system: CTA Cardiovascular system: s1, S 2 RRR Gastrointestinal system; soft, mild tenderness, no rigidity  Central nervous system: Alert and oriented. No focal neurological deficits. Extremities: symmetric power.  Skin: No rashes,  lesions or ulcers Psychiatry: mildly anxious    Data Reviewed: I have personally reviewed following labs and imaging studies  CBC:  Recent Labs Lab 07/05/16 1019 07/06/16 0544 07/07/16 0529  WBC 11.2* 9.4 9.9  NEUTROABS 9.2* 7.9*  --   HGB 14.0 12.3 12.7  HCT 42.0 36.5 37.3  MCV 90.3 90.1 88.8  PLT 238.0 188 540   Basic Metabolic Panel:  Recent Labs Lab 07/05/16 1019 07/06/16 0544 07/07/16 0529  NA 140 138 139  K 4.0 3.7 3.4*  CL 104 107 107  CO2 29 18* 24  GLUCOSE 96 72 152*  BUN 26* 18 9  CREATININE 0.86 0.87 0.73  CALCIUM 9.9 8.6* 8.8*   GFR: Estimated Creatinine Clearance: 71.8 mL/min (by C-G formula based  on SCr of 0.73 mg/dL). Liver Function Tests:  Recent Labs Lab 07/05/16 1019 07/06/16 0544  AST 22 23  ALT 16 16  ALKPHOS 67 59  BILITOT 0.8 1.3*  PROT 6.9 6.3*  ALBUMIN 4.4 3.5   No results for input(s): LIPASE, AMYLASE in the last 168 hours. No results for input(s): AMMONIA in the last 168 hours. Coagulation Profile:  Recent Labs Lab 07/06/16 0544  INR 1.13   Cardiac Enzymes: No results for input(s): CKTOTAL, CKMB, CKMBINDEX, TROPONINI in the last 168 hours. BNP (last 3 results) No results for input(s): PROBNP in the last 8760 hours. HbA1C: No results for input(s): HGBA1C in the last 72 hours. CBG: No results for input(s): GLUCAP in the last 168 hours. Lipid Profile: No results for input(s): CHOL, HDL, LDLCALC, TRIG, CHOLHDL, LDLDIRECT in the last 72 hours. Thyroid Function Tests: No results for input(s): TSH, T4TOTAL, FREET4, T3FREE, THYROIDAB in the last 72 hours. Anemia Panel: No results for input(s): VITAMINB12, FOLATE, FERRITIN, TIBC, IRON, RETICCTPCT in the last 72 hours. Sepsis Labs:  Recent Labs Lab 07/05/16 1545  LATICACIDVEN 0.67    Recent Results (from the past 240 hour(s))  Culture, blood (routine x 2)     Status: None (Preliminary result)   Collection Time: 07/05/16  3:40 PM  Result Value Ref Range Status   Specimen Description BLOOD LEFT ANTECUBITAL  Final   Special Requests   Final    BOTTLES DRAWN AEROBIC AND ANAEROBIC Blood Culture adequate volume   Culture   Final    NO GROWTH 2 DAYS Performed at [facility], [address] [place], [place] [address]    Report Status PENDING  Incomplete  Culture, blood (routine x 2)     Status: None (Preliminary result)   Collection Time: 07/05/16  3:45 PM  Result Value Ref Range Status   Specimen Description BLOOD BLOOD LEFT HAND  Final   Special Requests   Final    BOTTLES DRAWN AEROBIC AND ANAEROBIC Blood Culture adequate volume   Culture   Final    NO GROWTH 2 DAYS Performed at [facility], [place] [address], [place],  [address]    Report Status PENDING  Incomplete         Radiology Studies: No results found.      Scheduled Meds: . calcium-vitamin D  1 tablet Oral BID  . enoxaparin (LOVENOX) injection  40 mg Subcutaneous Q24H  . fluticasone  1 spray Each Nare Daily  . loratadine  10 mg Oral Daily  . pantoprazole  40 mg Oral QHS  . vitamin B-12  1,000 mcg Oral Daily   Continuous Infusions: . dextrose 5 % and 0.9 % NaCl with KCl 20  mEq/L 100 mL/hr at 07/07/16 0914  . piperacillin-tazobactam (ZOSYN)  IV 3.375 g (07/07/16 0753)     LOS: 1 day    Time spent: 35 minutes.     Elmarie Shiley, MD [facility] Pager [phone]  If 7PM-7AM, please contact night-coverage [email or web address] Password [email or web address] 07/07/2016, 2:53 PM

## 2016-07-07 NOTE — Progress Notes (Signed)
[facility]/Trauma Progress Note      Subjective:  CC: abdominal pain   Pt states her pain is not improved and is still having abdominal pain with urination. She has nausea but no vomiting. Frontal HA and pressure behind her eyes with mild sinus congestion. She takes a steroid nasal spray daily for allergies.   Objective: Vital signs in last 24 hours: Temp:  [98.6 F (37 C)-99.9 F (37.7 C)] 98.6 F (37 C) (05/10 0501) Pulse Rate:  [75-87] 87 (05/10 0501) Resp:  [16-18] 16 (05/10 0501) BP: (101-161)/(54-79) 161/79 (05/10 0501) SpO2:  [96 %-100 %] 100 % (05/10 0501) Last BM Date: 07/04/16  Intake/Output from previous day: 05/09 0701 - 05/10 0700 In: 2295 [I.V.:2145; IV Piggyback:150] Out: -  Intake/Output this shift: No intake/output data recorded.  PE: Gen:  Alert, NAD, pleasant, cooperative Card:  RRR, no M/G/R heard Pulm:  CTA, no W/R/R, effort normal Abd: Soft, not distended, +BS, no HSM, TTP in suprapubic and LLQ regions with guarding Skin: no rashes noted, warm and dry  Lab Results:   Recent Labs  07/06/16 0544 07/07/16 0529  WBC 9.4 9.9  HGB 12.3 12.7  HCT 36.5 37.3  PLT 188 184   BMET  Recent Labs  07/06/16 0544 07/07/16 0529  NA 138 139  K 3.7 3.4*  CL 107 107  CO2 18* 24  GLUCOSE 72 152*  BUN 18 9  CREATININE 0.87 0.73  CALCIUM 8.6* 8.8*   PT/INR  Recent Labs  07/06/16 0544  LABPROT 14.5  INR 1.13   CMP     Component Value Date/Time   NA 139 07/07/2016 0529   K 3.4 (L) 07/07/2016 0529   CL 107 07/07/2016 0529   CO2 24 07/07/2016 0529   GLUCOSE 152 (H) 07/07/2016 0529   GLUCOSE 97 05/05/2009   BUN 9 07/07/2016 0529   CREATININE 0.73 07/07/2016 0529   CALCIUM 8.8 (L) 07/07/2016 0529   PROT 6.3 (L) 07/06/2016 0544   ALBUMIN 3.5 07/06/2016 0544   AST 23 07/06/2016 0544   ALT 16 07/06/2016 0544   ALKPHOS 59 07/06/2016 0544   BILITOT 1.3 (H) 07/06/2016 0544   GFRNONAA >60 07/07/2016 0529   GFRAA >60 07/07/2016  0529   Lipase  No results found for: LIPASE  Studies/Results: Ct Abdomen Pelvis W Contrast  Result Date: 07/05/2016 CLINICAL DATA:  Diffuse low abdominal pain for 1 day with fever. History of diverticulitis and ischemic colitis. EXAM: CT ABDOMEN AND PELVIS WITH CONTRAST TECHNIQUE: Multidetector CT imaging of the abdomen and pelvis was performed using the standard protocol following bolus administration of intravenous contrast. CONTRAST:  135mL ISOVUE-300 IOPAMIDOL (ISOVUE-300) INJECTION 61% COMPARISON:  CT 09/03/2010 and 09/26/2007. FINDINGS: Lower chest: Clear lung bases. No significant pleural or pericardial effusion. Hepatobiliary: Scattered small, low-density hepatic lesions are similar to the previous study. No worrisome hepatic findings. No significant biliary dilatation post cholecystectomy. Pancreas: Unremarkable. No pancreatic ductal dilatation or surrounding inflammatory changes. Spleen: Normal in size without focal abnormality. Adrenals/Urinary Tract: Left adrenal nodule measuring 2.4 x 2.0 cm (image 21) has mildly enlarged compared with the prior study at which time it measured 2.0 x 1.6 cm. The right adrenal gland appears normal. The kidneys appear normal without evidence of urinary tract calculus, suspicious lesion or hydronephrosis. No bladder abnormalities are seen. Stomach/Bowel: Small hiatal hernia. The stomach, small bowel and proximal colon appear normal. There are diverticular changes of the descending and sigmoid colon. There is sigmoid colon wall thickening and surrounding  inflammation. In addition, there is a small amount of surrounding extraluminal fluid and air (images 64-71). No drainable fluid collection or free air. No evidence of bowel obstruction. Vascular/Lymphatic: There are no enlarged abdominal or pelvic lymph nodes. Stable minimal aortic and branch vessel atherosclerosis. Reproductive: The uterus and adnexa appear unremarkable. Other: No evidence of abdominal wall mass or  hernia. No ascites. Musculoskeletal: No acute or significant osseous findings. There is multilevel lumbar spondylosis. There is asymmetric left hip arthropathy. IMPRESSION: 1. Acute sigmoid diverticulitis with small contained micro perforation manifesting as adjacent extraluminal air and fluid. No drainable fluid collection or free air. 2. Although a left adrenal nodule has mildly enlarged from 2012, this has been present since at least 2009 and is still likely an adenoma. 3. Stable small low-density hepatic lesions. 4. These results will be called to the ordering clinician or representative by the Radiologist Assistant, and communication documented in the PACS or zVision Dashboard. Electronically Signed   By: Richardean Sale M.D.   On: 07/05/2016 13:46    Anti-infectives: Anti-infectives    Start     Dose/Rate Route Frequency Ordered Stop   07/06/16 0000  piperacillin-tazobactam (ZOSYN) IVPB 3.375 g     3.375 g 12.5 mL/hr over 240 Minutes Intravenous Every 8 hours 07/05/16 1843     07/05/16 1545  piperacillin-tazobactam (ZOSYN) IVPB 3.375 g     3.375 g 100 mL/hr over 30 Minutes Intravenous  Once 07/05/16 1534 07/05/16 1638   07/05/16 1530  piperacillin-tazobactam (ZOSYN) IVPB 4.5 g  Status:  Discontinued     4.5 g 200 mL/hr over 30 Minutes Intravenous  Once 07/05/16 1523 07/05/16 1534       Assessment/Plan Diverticulitis - sigmoid with small contained micro perforation - bowel rest, NPO with sips and chips, IVF  ID: Zosyn 5/8>>  DISPO: await improvement in pain before advancing to clear liquids, continue bowel rest and IV abx. We will continue to follow.     LOS: 1 day    Kalman Drape , [facility] 07/07/2016, 8:10 AM Pager: [phone] Consults: [phone] Mon-Fri 7:00 am-4:30 pm Sat-Sun 7:00 am-11:30 am

## 2016-07-08 LAB — BASIC METABOLIC PANEL
ANION GAP: 7 (ref 5–15)
BUN: 7 mg/dL (ref 6–20)
CALCIUM: 8.8 mg/dL — AB (ref 8.9–10.3)
CO2: 23 mmol/L (ref 22–32)
Chloride: 110 mmol/L (ref 101–111)
Creatinine, Ser: 0.69 mg/dL (ref 0.44–1.00)
GFR calc Af Amer: >60 mL/min (ref >60)
GLUCOSE: 119 mg/dL — AB (ref 65–99)
Potassium: 4 mmol/L (ref 3.5–5.1)
Sodium: 140 mmol/L (ref 135–145)

## 2016-07-08 LAB — CBC
HCT: 37.4 % (ref 36.0–46.0)
HEMOGLOBIN: 12.4 g/dL (ref 12.0–15.0)
MCH: 29.5 pg (ref 26.0–34.0)
MCHC: 33.2 g/dL (ref 30.0–36.0)
MCV: 89 fL (ref 78.0–100.0)
Platelets: 184 10*3/uL (ref 150–400)
RBC: 4.2 MIL/uL (ref 3.87–5.11)
RDW: 13.5 % (ref 11.5–15.5)
WBC: 7 10*3/uL (ref 4.0–10.5)

## 2016-07-08 MED ORDER — ENSURE ENLIVE PO LIQD
237.0000 mL | Freq: Two times a day (BID) | ORAL | Status: DC
  Administered 2016-07-09: 237 mL via ORAL

## 2016-07-08 NOTE — Progress Notes (Signed)
PROGRESS NOTE    Tracy [name]  UUV:[id number] DOB: [birth year] DOA: 07/05/2016 PCP: [name], MD    Brief Narrative:  Tracy [name] is a 66 y.o. female with medical history significant for GERD and diverticulosis, presenting to the [facility] ED for evaluation of fevers, chills, headache, and abdominal pain which began the day prior. Patient reports that she had been in her usual state of fairly good health until yesterday when she developed fevers, chills, headache, and general malaise. There was some mild lower abdominal tenderness at that time, but the patient went to sleep, hoping that the symptoms would resolve overnight. Unfortunately, upon waking this morning, abdominal pain had worsened and she developed recurrent fevers and chills. She describes her pain as severe, localized to the lower abdomen near the midline, constant, sharp and cramping, associated with 3 loose stools this morning, but no melena or hematochezia. She has not been vomiting. She denies any chest pain, palpitations, dyspnea, or cough. She reports experiencing acute diverticulitis several years ago and describes a current symptoms as similar. Patient saw her primary care physician for evaluation of these complaints, was sent for CT of the abdomen and pelvis, noted to have a sigmoid diverticulitis with microperforation, and was directed to the ED for further evaluation.   Assessment & Plan:   Principal Problem:   Perforation of sigmoid colon due to diverticulitis Active Problems:   GERD   Acute diverticulitis   1-Acute diverticulitis with microperforation;  Continue with IV Zosyn.  Continue with pain meds as needed.  Appreciate Surgery evaluation.  WBC normal.  blood culture, no growth to date,. Pain is better, denies nausea.  Plan to start clear diet today.   2-GERD; PPI.   3-Hypokalemia; resolved.    DVT prophylaxis:  lovenox,.  Code Status: full code.  Family Communication: care discussed  with patient.  Disposition Plan: remain in the hospital for IV antibiotics.    Consultants:   Surgery    Procedures:  none   Antimicrobials:   Zosyn 5-08   Subjective: Abdominal pain has improved. Passing gas.     Objective: Vitals:   07/07/16 1359 07/07/16 2026 07/08/16 0450 07/08/16 1420  BP: 115/84 139/62 126/69 (!) 117/54  Pulse: 86 72 72 94  Resp: 18 18 16 18   Temp: 99 F (37.2 C) 97.6 F (36.4 C) 98.5 F (36.9 C) 98.5 F (36.9 C)  TempSrc: Oral Oral Oral Oral  SpO2: 99% 100% 95% 100%  Weight:      Height:        Intake/Output Summary (Last 24 hours) at 07/08/16 1422 Last data filed at 07/08/16 1300  Gross per 24 hour  Intake          1138.33 ml  Output             1500 ml  Net          -361.67 ml   Filed Weights   07/05/16 1419 07/05/16 1815  Weight: 76.2 kg (168 lb) 80.1 kg (176 lb 9.4 oz)    Examination:  General exam: NAD Respiratory system: CTA Cardiovascular system: S 1, S 2 RRR Gastrointestinal system; soft, mild tenderness, no rigidity  Central nervous system: Alert and oriented. No focal neurological deficits. Extremities: symmetric power.  Skin: No rashes, lesions or ulcers Psychiatry: mildly anxious    Data Reviewed: I have personally reviewed following labs and imaging studies  CBC:  Recent Labs Lab 07/05/16 1019 07/06/16 0544 07/07/16 0529 07/08/16 0558  WBC  11.2* 9.4 9.9 7.0  NEUTROABS 9.2* 7.9*  --   --   HGB 14.0 12.3 12.7 12.4  HCT 42.0 36.5 37.3 37.4  MCV 90.3 90.1 88.8 89.0  PLT 238.0 188 184 161   Basic Metabolic Panel:  Recent Labs Lab 07/05/16 1019 07/06/16 0544 07/07/16 0529 07/08/16 0558  NA 140 138 139 140  K 4.0 3.7 3.4* 4.0  CL 104 107 107 110  CO2 29 18* 24 23  GLUCOSE 96 72 152* 119*  BUN 26* 18 9 7   CREATININE 0.86 0.87 0.73 0.69  CALCIUM 9.9 8.6* 8.8* 8.8*   GFR: Estimated Creatinine Clearance: 71.8 mL/min (by C-G formula based on SCr of 0.69 mg/dL). Liver Function  Tests:  Recent Labs Lab 07/05/16 1019 07/06/16 0544  AST 22 23  ALT 16 16  ALKPHOS 67 59  BILITOT 0.8 1.3*  PROT 6.9 6.3*  ALBUMIN 4.4 3.5   No results for input(s): LIPASE, AMYLASE in the last 168 hours. No results for input(s): AMMONIA in the last 168 hours. Coagulation Profile:  Recent Labs Lab 07/06/16 0544  INR 1.13   Cardiac Enzymes: No results for input(s): CKTOTAL, CKMB, CKMBINDEX, TROPONINI in the last 168 hours. BNP (last 3 results) No results for input(s): PROBNP in the last 8760 hours. HbA1C: No results for input(s): HGBA1C in the last 72 hours. CBG: No results for input(s): GLUCAP in the last 168 hours. Lipid Profile: No results for input(s): CHOL, HDL, LDLCALC, TRIG, CHOLHDL, LDLDIRECT in the last 72 hours. Thyroid Function Tests: No results for input(s): TSH, T4TOTAL, FREET4, T3FREE, THYROIDAB in the last 72 hours. Anemia Panel: No results for input(s): VITAMINB12, FOLATE, FERRITIN, TIBC, IRON, RETICCTPCT in the last 72 hours. Sepsis Labs:  Recent Labs Lab 07/05/16 1545  LATICACIDVEN 0.67    Recent Results (from the past 240 hour(s))  Culture, blood (routine x 2)     Status: None (Preliminary result)   Collection Time: 07/05/16  3:40 PM  Result Value Ref Range Status   Specimen Description BLOOD LEFT ANTECUBITAL  Final   Special Requests   Final    BOTTLES DRAWN AEROBIC AND ANAEROBIC Blood Culture adequate volume   Culture   Final    NO GROWTH 3 DAYS Performed at [facility], [address], [place], [place] [address]    Report Status PENDING  Incomplete  Culture, blood (routine x 2)     Status: None (Preliminary result)   Collection Time: 07/05/16  3:45 PM  Result Value Ref Range Status   Specimen Description BLOOD BLOOD LEFT HAND  Final   Special Requests   Final    BOTTLES DRAWN AEROBIC AND ANAEROBIC Blood Culture adequate volume   Culture   Final    NO GROWTH 3 DAYS Performed at [facility], [place] [address],  [place], [place] [address]    Report Status PENDING  Incomplete         Radiology Studies: No results found.      Scheduled Meds: . calcium-vitamin D  1 tablet Oral BID  . enoxaparin (LOVENOX) injection  40 mg Subcutaneous Q24H  . feeding supplement (ENSURE ENLIVE)  237 mL Oral BID BM  . fluticasone  1 spray Each Nare Daily  . loratadine  10 mg Oral Daily  . pantoprazole  40 mg Oral QHS  . vitamin B-12  1,000 mcg Oral Daily   Continuous Infusions: . dextrose 5 % and 0.9 % NaCl with KCl 20 mEq/L 100 mL/hr at  07/07/16 2133  . piperacillin-tazobactam (ZOSYN)  IV 3.375 g (07/08/16 0832)     LOS: 2 days    Time spent: 35 minutes.     Elmarie Shiley, MD [facility] Pager [phone]  If 7PM-7AM, please contact night-coverage [email or web address] Password [email or web address] 07/08/2016, 2:22 PM

## 2016-07-08 NOTE — Progress Notes (Signed)
Progress Note: General Surgery Service   Assessment/Plan: Patient Active Problem List   Diagnosis Date Noted  . Acute diverticulitis 07/06/2016  . Perforation of sigmoid colon due to diverticulitis 07/05/2016  . OA (osteoarthritis) of knee 08/17/2015  . Dysuria 07/01/2015  . 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 Subclinical hyperthyroidism 06/05/2014  . UTI (urinary tract infection) 05/29/2014  . Trochanteric bursitis of left hip 04/01/2014  . Rosacea 03/26/2014  . Folliculitis 59/56/3875  . Allergic rhinitis 02/19/2014  . Left hip pain 12/19/2013  . Other malaise and fatigue 11/26/2013  . Acute sinusitis 11/26/2013  . Atrophic vaginitis 10/07/2013  . RUQ pain 01/16/2012  . Screening for malignant neoplasm of the cervix 01/12/2011  . Physical exam 01/12/2011  . HYPERTRIGLYCERIDEMIA 12/03/2009  . GERD 12/03/2009  . UTI'S, RECURRENT 12/03/2009  . LEG CRAMPS, NOCTURNAL 12/03/2009  . DIVERTICULITIS, HX OF 12/03/2009  . ANXIETY 11/05/2009  . Osteopenia 11/05/2009   Diverticulitis, improving -clear liquids and ensure -continue abx   LOS: 2 days  Chief Complaint/Subjective: Pain improved, ambulating, no nausea  Objective: Vital signs in last 24 hours: Temp:  [97.6 F (36.4 C)-99 F (37.2 C)] 98.5 F (36.9 C) (05/11 0450) Pulse Rate:  [72-86] 72 (05/11 0450) Resp:  [16-18] 16 (05/11 0450) BP: (115-139)/(62-84) 126/69 (05/11 0450) SpO2:  [95 %-100 %] 95 % (05/11 0450) Last BM Date: 07/07/16  Intake/Output from previous day: 05/10 0701 - 05/11 0700 In: 1068.3 [I.V.:968.3; IV Piggyback:100] Out: 900 [Urine:900] Intake/Output this shift: Total I/O In: 0  Out: 600 [Urine:600]  Lungs: CTAb  Cardiovascular: RRR  Abd: soft, TTP LLQ, no guarding  Extremities: no edema  Neuro: AOx4  Lab Results: CBC   Recent Labs  07/07/16 0529 07/08/16 0558  WBC 9.9 7.0  HGB 12.7 12.4  HCT 37.3 37.4  PLT 184 184   BMET  Recent Labs  07/07/16 0529 07/08/16 0558  NA  139 140  K 3.4* 4.0  CL 107 110  CO2 24 23  GLUCOSE 152* 119*  BUN 9 7  CREATININE 0.73 0.69  CALCIUM 8.8* 8.8*   PT/INR  Recent Labs  07/06/16 0544  LABPROT 14.5  INR 1.13   ABG No results for input(s): PHART, HCO3 in the last 72 hours.  Invalid input(s): PCO2, PO2  Studies/Results:  Anti-infectives: Anti-infectives    Start     Dose/Rate Route Frequency Ordered Stop   07/06/16 0000  piperacillin-tazobactam (ZOSYN) IVPB 3.375 g     3.375 g 12.5 mL/hr over 240 Minutes Intravenous Every 8 hours 07/05/16 1843     07/05/16 1545  piperacillin-tazobactam (ZOSYN) IVPB 3.375 g     3.375 g 100 mL/hr over 30 Minutes Intravenous  Once 07/05/16 1534 07/05/16 1638   07/05/16 1530  piperacillin-tazobactam (ZOSYN) IVPB 4.5 g  Status:  Discontinued     4.5 g 200 mL/hr over 30 Minutes Intravenous  Once 07/05/16 1523 07/05/16 1534      Medications: Scheduled Meds: . calcium-vitamin D  1 tablet Oral BID  . enoxaparin (LOVENOX) injection  40 mg Subcutaneous Q24H  . feeding supplement (ENSURE ENLIVE)  237 mL Oral BID BM  . fluticasone  1 spray Each Nare Daily  . loratadine  10 mg Oral Daily  . pantoprazole  40 mg Oral QHS  . vitamin B-12  1,000 mcg Oral Daily   Continuous Infusions: . dextrose 5 % and 0.9 % NaCl with KCl 20 mEq/L 100 mL/hr at 07/07/16 2133  . piperacillin-tazobactam (ZOSYN)  IV 3.375 g (07/08/16  0832)   PRN Meds:.acetaminophen, HYDROcodone-acetaminophen, metroNIDAZOLE, ondansetron **OR** ondansetron (ZOFRAN) IV  Mickeal Skinner, MD Pg# [phone] [facility]

## 2016-07-09 LAB — CBC
HCT: 37.3 % (ref 36.0–46.0)
HEMOGLOBIN: 12.6 g/dL (ref 12.0–15.0)
MCH: 29.9 pg (ref 26.0–34.0)
MCHC: 33.8 g/dL (ref 30.0–36.0)
MCV: 88.6 fL (ref 78.0–100.0)
Platelets: 239 10*3/uL (ref 150–400)
RBC: 4.21 MIL/uL (ref 3.87–5.11)
RDW: 13.8 % (ref 11.5–15.5)
WBC: 6.4 10*3/uL (ref 4.0–10.5)

## 2016-07-09 LAB — BASIC METABOLIC PANEL
Anion gap: 5 (ref 5–15)
BUN: 6 mg/dL (ref 6–20)
CALCIUM: 9.2 mg/dL (ref 8.9–10.3)
CHLORIDE: 109 mmol/L (ref 101–111)
CO2: 26 mmol/L (ref 22–32)
CREATININE: 0.76 mg/dL (ref 0.44–1.00)
GFR calc non Af Amer: >60 mL/min (ref >60)
Glucose, Bld: 102 mg/dL — ABNORMAL HIGH (ref 65–99)
Potassium: 3.9 mmol/L (ref 3.5–5.1)
SODIUM: 140 mmol/L (ref 135–145)

## 2016-07-09 MED ORDER — HYDROCORTISONE 0.5 % EX OINT
TOPICAL_OINTMENT | Freq: Two times a day (BID) | CUTANEOUS | Status: DC
  Filled 2016-07-09: qty 28.35

## 2016-07-09 MED ORDER — AMOXICILLIN-POT CLAVULANATE 875-125 MG PO TABS
1.0000 | ORAL_TABLET | Freq: Two times a day (BID) | ORAL | Status: DC
  Administered 2016-07-09 – 2016-07-11 (×5): 1 via ORAL
  Filled 2016-07-09 (×5): qty 1

## 2016-07-09 MED ORDER — HYDROCORTISONE 0.5 % EX CREA
TOPICAL_CREAM | Freq: Two times a day (BID) | CUTANEOUS | Status: DC
  Administered 2016-07-09 – 2016-07-17 (×15): via TOPICAL
  Filled 2016-07-09: qty 28.35

## 2016-07-09 MED ORDER — ENSURE ENLIVE PO LIQD
237.0000 mL | Freq: Three times a day (TID) | ORAL | Status: DC
  Administered 2016-07-09 (×2): 237 mL via ORAL

## 2016-07-09 NOTE — Progress Notes (Signed)
PROGRESS NOTE    Tracy [name]  WRU:[id number] DOB: 04-06-1950 DOA: 07/05/2016 PCP: [name], MD    Brief Narrative:  Tracy [name] is a 66 y.o. female with medical history significant for GERD and diverticulosis, presenting to the [facility] ED for evaluation of fevers, chills, headache, and abdominal pain which began the day prior. Patient reports that she had been in her usual state of fairly good health until yesterday when she developed fevers, chills, headache, and general malaise. There was some mild lower abdominal tenderness at that time, but the patient went to sleep, hoping that the symptoms would resolve overnight. Unfortunately, upon waking this morning, abdominal pain had worsened and she developed recurrent fevers and chills. She describes her pain as severe, localized to the lower abdomen near the midline, constant, sharp and cramping, associated with 3 loose stools this morning, but no melena or hematochezia. She has not been vomiting. She denies any chest pain, palpitations, dyspnea, or cough. She reports experiencing acute diverticulitis several years ago and describes a current symptoms as similar. Patient saw her primary care physician for evaluation of these complaints, was sent for CT of the abdomen and pelvis, noted to have a sigmoid diverticulitis with microperforation, and was directed to the ED for further evaluation.   Assessment & Plan:   Principal Problem:   Perforation of sigmoid colon due to diverticulitis Active Problems:   GERD   Acute diverticulitis   1-Acute diverticulitis with microperforation;  Change IV Zosyn to oral Augmentin .  Continue with pain meds as needed.  Appreciate Surgery evaluation.  WBC normal.  blood culture, no growth to date,. Pain is better, denies nausea.  Full liquid diet. Change IV antibiotics to oral.   2-GERD; PPI.   3-Hypokalemia; resolved.    DVT prophylaxis:  lovenox,.  Code Status: full code.  Family  Communication: care discussed with patient.  Disposition Plan: change IV antibiotics to oral    Consultants:   Surgery    Procedures:  none   Antimicrobials:   Zosyn 5-08   Subjective: Report improvement of abdominal pain.     Objective: Vitals:   07/08/16 0450 07/08/16 1420 07/08/16 2023 07/09/16 0442  BP: 126/69 (!) 117/54 119/67 136/67  Pulse: 72 94 73 73  Resp: 16 18 20 18   Temp: 98.5 F (36.9 C) 98.5 F (36.9 C) 99.1 F (37.3 C) 98.5 F (36.9 C)  TempSrc: Oral Oral Oral Oral  SpO2: 95% 100% 100% 97%  Weight:      Height:        Intake/Output Summary (Last 24 hours) at 07/09/16 1257 Last data filed at 07/09/16 0200  Gross per 24 hour  Intake              960 ml  Output                0 ml  Net              960 ml   Filed Weights   07/05/16 1419 07/05/16 1815  Weight: 76.2 kg (168 lb) 80.1 kg (176 lb 9.4 oz)    Examination:  General exam: NAD Respiratory system: CTA Cardiovascular system: S 2, S 2 RRR Gastrointestinal system; soft, mild tenderness, no rigidity  Central nervous system: non focal  Extremities: symmetric power.  Skin: No rashes, lesions or ulcers Psychiatry: mildly anxious    Data Reviewed: I have personally reviewed following labs and imaging studies  CBC:  Recent Labs Lab 07/05/16  1019 07/06/16 0544 07/07/16 0529 07/08/16 0558 07/09/16 0817  WBC 11.2* 9.4 9.9 7.0 6.4  NEUTROABS 9.2* 7.9*  --   --   --   HGB 14.0 12.3 12.7 12.4 12.6  HCT 42.0 36.5 37.3 37.4 37.3  MCV 90.3 90.1 88.8 89.0 88.6  PLT 238.0 188 184 184 008   Basic Metabolic Panel:  Recent Labs Lab 07/05/16 1019 07/06/16 0544 07/07/16 0529 07/08/16 0558 07/09/16 0817  NA 140 138 139 140 140  K 4.0 3.7 3.4* 4.0 3.9  CL 104 107 107 110 109  CO2 29 18* 24 23 26   GLUCOSE 96 72 152* 119* 102*  BUN 26* 18 9 7 6   CREATININE 0.86 0.87 0.73 0.69 0.76  CALCIUM 9.9 8.6* 8.8* 8.8* 9.2   GFR: Estimated Creatinine Clearance: 71.8 mL/min (by C-G  formula based on SCr of 0.76 mg/dL). Liver Function Tests:  Recent Labs Lab 07/05/16 1019 07/06/16 0544  AST 22 23  ALT 16 16  ALKPHOS 67 59  BILITOT 0.8 1.3*  PROT 6.9 6.3*  ALBUMIN 4.4 3.5   No results for input(s): LIPASE, AMYLASE in the last 168 hours. No results for input(s): AMMONIA in the last 168 hours. Coagulation Profile:  Recent Labs Lab 07/06/16 0544  INR 1.13   Cardiac Enzymes: No results for input(s): CKTOTAL, CKMB, CKMBINDEX, TROPONINI in the last 168 hours. BNP (last 3 results) No results for input(s): PROBNP in the last 8760 hours. HbA1C: No results for input(s): HGBA1C in the last 72 hours. CBG: No results for input(s): GLUCAP in the last 168 hours. Lipid Profile: No results for input(s): CHOL, HDL, LDLCALC, TRIG, CHOLHDL, LDLDIRECT in the last 72 hours. Thyroid Function Tests: No results for input(s): TSH, T4TOTAL, FREET4, T3FREE, THYROIDAB in the last 72 hours. Anemia Panel: No results for input(s): VITAMINB12, FOLATE, FERRITIN, TIBC, IRON, RETICCTPCT in the last 72 hours. Sepsis Labs:  Recent Labs Lab 07/05/16 1545  LATICACIDVEN 0.67    Recent Results (from the past 240 hour(s))  Culture, blood (routine x 2)     Status: None (Preliminary result)   Collection Time: 07/05/16  3:40 PM  Result Value Ref Range Status   Specimen Description BLOOD LEFT ANTECUBITAL  Final   Special Requests   Final    BOTTLES DRAWN AEROBIC AND ANAEROBIC Blood Culture adequate volume   Culture   Final    NO GROWTH 4 DAYS Performed at [facility] [address], [place], [place] [address]    Report Status PENDING  Incomplete  Culture, blood (routine x 2)     Status: None (Preliminary result)   Collection Time: 07/05/16  3:45 PM  Result Value Ref Range Status   Specimen Description BLOOD BLOOD LEFT HAND  Final   Special Requests   Final    BOTTLES DRAWN AEROBIC AND ANAEROBIC Blood Culture adequate volume   Culture   Final    NO GROWTH 4 DAYS Performed  at [facility], [place] [address] [place], [place] [address]    Report Status PENDING  Incomplete         Radiology Studies: No results found.      Scheduled Meds: . amoxicillin-clavulanate  1 tablet Oral Q12H  . calcium-vitamin D  1 tablet Oral BID  . enoxaparin (LOVENOX) injection  40 mg Subcutaneous Q24H  . feeding supplement (ENSURE ENLIVE)  237 mL Oral TID BM  . fluticasone  1 spray Each Nare Daily  . loratadine  10 mg Oral  Daily  . pantoprazole  40 mg Oral QHS  . vitamin B-12  1,000 mcg Oral Daily   Continuous Infusions: . dextrose 5 % and 0.9 % NaCl with KCl 20 mEq/L 100 mL/hr at 07/09/16 0436     LOS: 3 days    Time spent: 35 minutes.     Elmarie Shiley, MD [facility] Pager [phone]  If 7PM-7AM, please contact night-coverage [email or web address] Password [facility] 07/09/2016, 12:57 PM

## 2016-07-09 NOTE — Progress Notes (Signed)
Patient ID: Tracy [name], female   DOB: March 19, 1950, 66 y.o.   MRN: [id number]  [facility] Progress Note:   * No surgery found *  Subjective: Mental status is clear.  Feeling better and tolerating clears Objective: Vital signs in last 24 hours: Temp:  [98.5 F (36.9 C)-99.1 F (37.3 C)] 98.5 F (36.9 C) (05/12 0442) Pulse Rate:  [73-94] 73 (05/12 0442) Resp:  [18-20] 18 (05/12 0442) BP: (117-136)/(54-67) 136/67 (05/12 0442) SpO2:  [97 %-100 %] 97 % (05/12 0442)  Intake/Output from previous day: 05/11 0701 - 05/12 0700 In: 960 [P.O.:360; I.V.:400; IV Piggyback:200] Out: 600 [Urine:600] Intake/Output this shift: No intake/output data recorded.  Physical Exam: Work of breathing is normal.  Pain less  Lab Results:  Results for orders placed or performed during the hospital encounter of 07/05/16 (from the past 48 hour(s))  Basic metabolic panel     Status: Abnormal   Collection Time: 07/08/16  5:58 AM  Result Value Ref Range   Sodium 140 135 - 145 mmol/L   Potassium 4.0 3.5 - 5.1 mmol/L   Chloride 110 101 - 111 mmol/L   CO2 23 22 - 32 mmol/L   Glucose, Bld 119 (H) 65 - 99 mg/dL   BUN 7 6 - 20 mg/dL   Creatinine, Ser 0.69 0.44 - 1.00 mg/dL   Calcium 8.8 (L) 8.9 - 10.3 mg/dL   GFR calc non Af Amer >60 >60 mL/min   GFR calc Af Amer >60 >60 mL/min    Comment: (NOTE) The eGFR has been calculated using the CKD EPI equation. This calculation has not been validated in all clinical situations. eGFR's persistently <60 mL/min signify possible Chronic Kidney Disease.    Anion gap 7 5 - 15  CBC     Status: None   Collection Time: 07/08/16  5:58 AM  Result Value Ref Range   WBC 7.0 4.0 - 10.5 K/uL   RBC 4.20 3.87 - 5.11 MIL/uL   Hemoglobin 12.4 12.0 - 15.0 g/dL   HCT 37.4 36.0 - 46.0 %   MCV 89.0 78.0 - 100.0 fL   MCH 29.5 26.0 - 34.0 pg   MCHC 33.2 30.0 - 36.0 g/dL   RDW 13.5 11.5 - 15.5 %   Platelets 184 150 - 400 K/uL  CBC     Status: None   Collection Time:  07/09/16  8:17 AM  Result Value Ref Range   WBC 6.4 4.0 - 10.5 K/uL   RBC 4.21 3.87 - 5.11 MIL/uL   Hemoglobin 12.6 12.0 - 15.0 g/dL   HCT 37.3 36.0 - 46.0 %   MCV 88.6 78.0 - 100.0 fL   MCH 29.9 26.0 - 34.0 pg   MCHC 33.8 30.0 - 36.0 g/dL   RDW 13.8 11.5 - 15.5 %   Platelets 239 150 - 400 K/uL  Basic metabolic panel     Status: Abnormal   Collection Time: 07/09/16  8:17 AM  Result Value Ref Range   Sodium 140 135 - 145 mmol/L   Potassium 3.9 3.5 - 5.1 mmol/L   Chloride 109 101 - 111 mmol/L   CO2 26 22 - 32 mmol/L   Glucose, Bld 102 (H) 65 - 99 mg/dL   BUN 6 6 - 20 mg/dL   Creatinine, Ser 0.76 0.44 - 1.00 mg/dL   Calcium 9.2 8.9 - 10.3 mg/dL   GFR calc non Af Amer >60 >60 mL/min   GFR calc Af Amer >60 >60 mL/min  Comment: (NOTE) The eGFR has been calculated using the CKD EPI equation. This calculation has not been validated in all clinical situations. eGFR's persistently <60 mL/min signify possible Chronic Kidney Disease.    Anion gap 5 5 - 15    Radiology/Results: No results found.  Anti-infectives: Anti-infectives    Start     Dose/Rate Route Frequency Ordered Stop   07/06/16 0000  piperacillin-tazobactam (ZOSYN) IVPB 3.375 g     3.375 g 12.5 mL/hr over 240 Minutes Intravenous Every 8 hours 07/05/16 1843     07/05/16 1545  piperacillin-tazobactam (ZOSYN) IVPB 3.375 g     3.375 g 100 mL/hr over 30 Minutes Intravenous  Once 07/05/16 1534 07/05/16 1638   07/05/16 1530  piperacillin-tazobactam (ZOSYN) IVPB 4.5 g  Status:  Discontinued     4.5 g 200 mL/hr over 30 Minutes Intravenous  Once 07/05/16 1523 07/05/16 1534      Assessment/Plan: Problem List: Patient Active Problem List   Diagnosis Date Noted  . Acute diverticulitis 07/06/2016  . Perforation of sigmoid colon due to diverticulitis 07/05/2016  . OA (osteoarthritis) of knee 08/17/2015  . Dysuria 07/01/2015  . 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 Subclinical hyperthyroidism 06/05/2014  . UTI (urinary  tract infection) 05/29/2014  . Trochanteric bursitis of left hip 04/01/2014  . Rosacea 03/26/2014  . Folliculitis 14/97/0263  . Allergic rhinitis 02/19/2014  . Left hip pain 12/19/2013  . Other malaise and fatigue 11/26/2013  . Acute sinusitis 11/26/2013  . Atrophic vaginitis 10/07/2013  . RUQ pain 01/16/2012  . Screening for malignant neoplasm of the cervix 01/12/2011  . Physical exam 01/12/2011  . HYPERTRIGLYCERIDEMIA 12/03/2009  . GERD 12/03/2009  . UTI'S, RECURRENT 12/03/2009  . LEG CRAMPS, NOCTURNAL 12/03/2009  . DIVERTICULITIS, HX OF 12/03/2009  . ANXIETY 11/05/2009  . Osteopenia 11/05/2009    Would stay on liquids after discharge for a while as she transitions to oral antibiotics (? Augmentin) * No surgery found *    LOS: 3 days   Matt B. Hassell Done, MD, [facility] [phone] beeper [phone]  07/09/2016 10:09 AM

## 2016-07-10 LAB — CULTURE, BLOOD (ROUTINE X 2)
Culture: NO GROWTH
Culture: NO GROWTH
SPECIAL REQUESTS: ADEQUATE
Special Requests: ADEQUATE

## 2016-07-10 LAB — CBC
HCT: 36.9 % (ref 36.0–46.0)
Hemoglobin: 12.5 g/dL (ref 12.0–15.0)
MCH: 30.2 pg (ref 26.0–34.0)
MCHC: 33.9 g/dL (ref 30.0–36.0)
MCV: 89.1 fL (ref 78.0–100.0)
Platelets: 251 10*3/uL (ref 150–400)
RBC: 4.14 MIL/uL (ref 3.87–5.11)
RDW: 14 % (ref 11.5–15.5)
WBC: 6.8 10*3/uL (ref 4.0–10.5)

## 2016-07-10 MED ORDER — GLYCERIN (LAXATIVE) 2.1 G RE SUPP
1.0000 | Freq: Once | RECTAL | Status: AC
  Administered 2016-07-10: 1 via RECTAL
  Filled 2016-07-10: qty 1

## 2016-07-10 NOTE — Progress Notes (Signed)
Patient ID: Tracy [name], female   DOB: Jun 02, 1950, 66 y.o.   MRN: [id number] [facility] Surgery Progress Note:   * No surgery found *  Subjective: Mental status is clear.  Frustrated.  Had hard stool and right lower quadrant pain.   Objective: Vital signs in last 24 hours: Temp:  [98.4 F (36.9 C)-99 F (37.2 C)] 98.4 F (36.9 C) (05/13 0430) Pulse Rate:  [76-94] 76 (05/13 0430) Resp:  [16-20] 16 (05/13 0430) BP: (117-125)/(50-70) 124/70 (05/13 0430) SpO2:  [97 %-99 %] 99 % (05/13 0430)  Intake/Output from previous day: 05/12 0701 - 05/13 0700 In: 780 [P.O.:780] Out: 2 [Urine:2] Intake/Output this shift: No intake/output data recorded.  Physical Exam: Work of breathing is normal.  Pain on the right today  Lab Results:  Results for orders placed or performed during the hospital encounter of 07/05/16 (from the past 48 hour(s))  CBC     Status: None   Collection Time: 07/09/16  8:17 AM  Result Value Ref Range   WBC 6.4 4.0 - 10.5 K/uL   RBC 4.21 3.87 - 5.11 MIL/uL   Hemoglobin 12.6 12.0 - 15.0 g/dL   HCT 37.3 36.0 - 46.0 %   MCV 88.6 78.0 - 100.0 fL   MCH 29.9 26.0 - 34.0 pg   MCHC 33.8 30.0 - 36.0 g/dL   RDW 13.8 11.5 - 15.5 %   Platelets 239 150 - 400 K/uL  Basic metabolic panel     Status: Abnormal   Collection Time: 07/09/16  8:17 AM  Result Value Ref Range   Sodium 140 135 - 145 mmol/L   Potassium 3.9 3.5 - 5.1 mmol/L   Chloride 109 101 - 111 mmol/L   CO2 26 22 - 32 mmol/L   Glucose, Bld 102 (H) 65 - 99 mg/dL   BUN 6 6 - 20 mg/dL   Creatinine, Ser 0.76 0.44 - 1.00 mg/dL   Calcium 9.2 8.9 - 10.3 mg/dL   GFR calc non Af Amer >60 >60 mL/min   GFR calc Af Amer >60 >60 mL/min    Comment: (NOTE) The eGFR has been calculated using the CKD EPI equation. This calculation has not been validated in all clinical situations. eGFR's persistently <60 mL/min signify possible Chronic Kidney Disease.    Anion gap 5 5 - 15  CBC     Status: None   Collection Time:  07/10/16  6:35 AM  Result Value Ref Range   WBC 6.8 4.0 - 10.5 K/uL   RBC 4.14 3.87 - 5.11 MIL/uL   Hemoglobin 12.5 12.0 - 15.0 g/dL   HCT 36.9 36.0 - 46.0 %   MCV 89.1 78.0 - 100.0 fL   MCH 30.2 26.0 - 34.0 pg   MCHC 33.9 30.0 - 36.0 g/dL   RDW 14.0 11.5 - 15.5 %   Platelets 251 150 - 400 K/uL    Radiology/Results: No results found.  Anti-infectives: Anti-infectives    Start     Dose/Rate Route Frequency Ordered Stop   07/09/16 1230  amoxicillin-clavulanate (AUGMENTIN) 875-125 MG per tablet 1 tablet     1 tablet Oral Every 12 hours 07/09/16 1218     07/06/16 0000  piperacillin-tazobactam (ZOSYN) IVPB 3.375 g  Status:  Discontinued     3.375 g 12.5 mL/hr over 240 Minutes Intravenous Every 8 hours 07/05/16 1843 07/09/16 1218   07/05/16 1545  piperacillin-tazobactam (ZOSYN) IVPB 3.375 g     3.375 g 100 mL/hr over 30 Minutes Intravenous  Once  07/05/16 1534 07/05/16 1638   07/05/16 1530  piperacillin-tazobactam (ZOSYN) IVPB 4.5 g  Status:  Discontinued     4.5 g 200 mL/hr over 30 Minutes Intravenous  Once 07/05/16 1523 07/05/16 1534      Assessment/Plan: Problem List: Patient Active Problem List   Diagnosis Date Noted  . Acute diverticulitis 07/06/2016  . Perforation of sigmoid colon due to diverticulitis 07/05/2016  . OA (osteoarthritis) of knee 08/17/2015  . Dysuria 07/01/2015  . 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 Subclinical hyperthyroidism 06/05/2014  . UTI (urinary tract infection) 05/29/2014  . Trochanteric bursitis of left hip 04/01/2014  . Rosacea 03/26/2014  . Folliculitis 82/57/4935  . Allergic rhinitis 02/19/2014  . Left hip pain 12/19/2013  . Other malaise and fatigue 11/26/2013  . Acute sinusitis 11/26/2013  . Atrophic vaginitis 10/07/2013  . RUQ pain 01/16/2012  . Screening for malignant neoplasm of the cervix 01/12/2011  . Physical exam 01/12/2011  . HYPERTRIGLYCERIDEMIA 12/03/2009  . GERD 12/03/2009  . UTI'S, RECURRENT 12/03/2009  . LEG CRAMPS,  NOCTURNAL 12/03/2009  . DIVERTICULITIS, HX OF 12/03/2009  . ANXIETY 11/05/2009  . Osteopenia 11/05/2009    Will give glycerin suppository and try to soften stools.  Continue thin liquids * No surgery found *    LOS: 4 days   Matt B. Hassell Done, MD, [facility] [phone] beeper [phone]  07/10/2016 9:40 AM

## 2016-07-10 NOTE — Progress Notes (Signed)
PROGRESS NOTE    Tracy [name]  IRC:[id number] DOB: 02-Jan-1951 DOA: 07/05/2016 PCP: [name], MD    Brief Narrative:  Tracy [name] is a 66 y.o. female with medical history significant for GERD and diverticulosis, presenting to the [facility] ED for evaluation of fevers, chills, headache, and abdominal pain which began the day prior. Patient reports that she had been in her usual state of fairly good health until yesterday when she developed fevers, chills, headache, and general malaise. There was some mild lower abdominal tenderness at that time, but the patient went to sleep, hoping that the symptoms would resolve overnight. Unfortunately, upon waking this morning, abdominal pain had worsened and she developed recurrent fevers and chills. She describes her pain as severe, localized to the lower abdomen near the midline, constant, sharp and cramping, associated with 3 loose stools this morning, but no melena or hematochezia. She has not been vomiting. She denies any chest pain, palpitations, dyspnea, or cough. She reports experiencing acute diverticulitis several years ago and describes a current symptoms as similar. Patient saw her primary care physician for evaluation of these complaints, was sent for CT of the abdomen and pelvis, noted to have a sigmoid diverticulitis with microperforation, and was directed to the ED for further evaluation.   Assessment & Plan:   Principal Problem:   Perforation of sigmoid colon due to diverticulitis Active Problems:   GERD   Acute diverticulitis   1-Acute diverticulitis with microperforation;  Change IV Zosyn to oral Augmentin 5-12 .  Continue with pain meds as needed.  Appreciate Surgery evaluation.  WBC normal. blood culture, no growth to date,. Change IV antibiotics to oral 5-12. Complaining of abdominal pain this morning. Had very small BM, "small ball ". Back on clear diet.  Glycerin suppository ordered.   2-GERD; PPI.    3-Hypokalemia; resolved.    DVT prophylaxis:  lovenox,.  Code Status: full code.  Family Communication: care discussed with patient.  Disposition Plan: change IV antibiotics to oral    Consultants:   Surgery    Procedures:  none   Antimicrobials:   Zosyn 5-08---5-12  Augmentin 5-12   Subjective: Having abdominal pain this morning. Had very small hard stool.  Didn't like the ensure.     Objective: Vitals:   07/09/16 0442 07/09/16 1400 07/09/16 2315 07/10/16 0430  BP: 136/67 125/65 (!) 117/50 124/70  Pulse: 73 87 94 76  Resp: 18 18 20 16   Temp: 98.5 F (36.9 C) 99 F (37.2 C) 98.5 F (36.9 C) 98.4 F (36.9 C)  TempSrc: Oral Oral Oral Oral  SpO2: 97% 99% 97% 99%  Weight:      Height:        Intake/Output Summary (Last 24 hours) at 07/10/16 1108 Last data filed at 07/09/16 1856  Gross per 24 hour  Intake              540 ml  Output                2 ml  Net              538 ml   Filed Weights   07/05/16 1419 07/05/16 1815  Weight: 76.2 kg (168 lb) 80.1 kg (176 lb 9.4 oz)    Examination:  General exam: NAD Respiratory system: CTA Cardiovascular system: S 2, S 2 RRR Gastrointestinal system; Soft, mild tenderness.  Central nervous system: non focal  Extremities: symmetric power.  Skin: No rashes, lesions or ulcers  Psychiatry: no distress,.     Data Reviewed: I have personally reviewed following labs and imaging studies  CBC:  Recent Labs Lab 07/05/16 1019 07/06/16 0544 07/07/16 0529 07/08/16 0558 07/09/16 0817 07/10/16 0635  WBC 11.2* 9.4 9.9 7.0 6.4 6.8  NEUTROABS 9.2* 7.9*  --   --   --   --   HGB 14.0 12.3 12.7 12.4 12.6 12.5  HCT 42.0 36.5 37.3 37.4 37.3 36.9  MCV 90.3 90.1 88.8 89.0 88.6 89.1  PLT 238.0 188 184 184 239 631   Basic Metabolic Panel:  Recent Labs Lab 07/05/16 1019 07/06/16 0544 07/07/16 0529 07/08/16 0558 07/09/16 0817  NA 140 138 139 140 140  K 4.0 3.7 3.4* 4.0 3.9  CL 104 107 107 110 109  CO2 29  18* 24 23 26   GLUCOSE 96 72 152* 119* 102*  BUN 26* 18 9 7 6   CREATININE 0.86 0.87 0.73 0.69 0.76  CALCIUM 9.9 8.6* 8.8* 8.8* 9.2   GFR: Estimated Creatinine Clearance: 71.8 mL/min (by C-G formula based on SCr of 0.76 mg/dL). Liver Function Tests:  Recent Labs Lab 07/05/16 1019 07/06/16 0544  AST 22 23  ALT 16 16  ALKPHOS 67 59  BILITOT 0.8 1.3*  PROT 6.9 6.3*  ALBUMIN 4.4 3.5   No results for input(s): LIPASE, AMYLASE in the last 168 hours. No results for input(s): AMMONIA in the last 168 hours. Coagulation Profile:  Recent Labs Lab 07/06/16 0544  INR 1.13   Cardiac Enzymes: No results for input(s): CKTOTAL, CKMB, CKMBINDEX, TROPONINI in the last 168 hours. BNP (last 3 results) No results for input(s): PROBNP in the last 8760 hours. HbA1C: No results for input(s): HGBA1C in the last 72 hours. CBG: No results for input(s): GLUCAP in the last 168 hours. Lipid Profile: No results for input(s): CHOL, HDL, LDLCALC, TRIG, CHOLHDL, LDLDIRECT in the last 72 hours. Thyroid Function Tests: No results for input(s): TSH, T4TOTAL, FREET4, T3FREE, THYROIDAB in the last 72 hours. Anemia Panel: No results for input(s): VITAMINB12, FOLATE, FERRITIN, TIBC, IRON, RETICCTPCT in the last 72 hours. Sepsis Labs:  Recent Labs Lab 07/05/16 1545  LATICACIDVEN 0.67    Recent Results (from the past 240 hour(s))  Culture, blood (routine x 2)     Status: None   Collection Time: 07/05/16  3:40 PM  Result Value Ref Range Status   Specimen Description BLOOD LEFT ANTECUBITAL  Final   Special Requests   Final    BOTTLES DRAWN AEROBIC AND ANAEROBIC Blood Culture adequate volume   Culture   Final    NO GROWTH 5 DAYS Performed at [facility], [address] [place], [place] [address]    Report Status 07/10/2016 FINAL  Final  Culture, blood (routine x 2)     Status: None   Collection Time: 07/05/16  3:45 PM  Result Value Ref Range Status   Specimen Description BLOOD BLOOD LEFT  HAND  Final   Special Requests   Final    BOTTLES DRAWN AEROBIC AND ANAEROBIC Blood Culture adequate volume   Culture   Final    NO GROWTH 5 DAYS Performed at [facility], [place] [address] [place], [place] [address]    Report Status 07/10/2016 FINAL  Final         Radiology Studies: No results found.      Scheduled Meds: . amoxicillin-clavulanate  1 tablet Oral Q12H  . calcium-vitamin D  1 tablet Oral BID  . enoxaparin (LOVENOX) injection  40 mg Subcutaneous Q24H  . feeding supplement (ENSURE ENLIVE)  237 mL Oral TID BM  . fluticasone  1 spray Each Nare Daily  . Glycerin (Adult)  1 suppository Rectal Once  . hydrocortisone cream   Topical BID  . loratadine  10 mg Oral Daily  . pantoprazole  40 mg Oral QHS  . vitamin B-12  1,000 mcg Oral Daily   Continuous Infusions: . dextrose 5 % and 0.9 % NaCl with KCl 20 mEq/L 100 mL/hr at 07/09/16 2238     LOS: 4 days    Time spent: 25 minutes.     Elmarie Shiley, MD [facility] Pager [phone]  If 7PM-7AM, please contact night-coverage [email or web address] Password [email or web address] 07/10/2016, 11:08 AM

## 2016-07-11 ENCOUNTER — Inpatient Hospital Stay (HOSPITAL_COMMUNITY): Payer: Medicare Other

## 2016-07-11 LAB — BASIC METABOLIC PANEL
ANION GAP: 9 (ref 5–15)
ANION GAP: 8 (ref 5–15)
BUN: 7 mg/dL (ref 6–20)
BUN: 8 mg/dL (ref 6–20)
CHLORIDE: 105 mmol/L (ref 101–111)
CHLORIDE: 102 mmol/L (ref 101–111)
CO2: 26 mmol/L (ref 22–32)
CO2: 27 mmol/L (ref 22–32)
CREATININE: 0.76 mg/dL (ref 0.44–1.00)
Calcium: 9.6 mg/dL (ref 8.9–10.3)
Calcium: 9.8 mg/dL (ref 8.9–10.3)
Creatinine, Ser: 0.81 mg/dL (ref 0.44–1.00)
GFR calc Af Amer: >60 mL/min (ref >60)
GFR calc non Af Amer: >60 mL/min (ref >60)
GFR calc non Af Amer: >60 mL/min (ref >60)
Glucose, Bld: 107 mg/dL — ABNORMAL HIGH (ref 65–99)
Glucose, Bld: 103 mg/dL — ABNORMAL HIGH (ref 65–99)
POTASSIUM: 4.2 mmol/L (ref 3.5–5.1)
POTASSIUM: 4.2 mmol/L (ref 3.5–5.1)
Sodium: 140 mmol/L (ref 135–145)
Sodium: 137 mmol/L (ref 135–145)

## 2016-07-11 LAB — CBC
HEMATOCRIT: 36.9 % (ref 36.0–46.0)
HEMOGLOBIN: 12.2 g/dL (ref 12.0–15.0)
MCH: 29.7 pg (ref 26.0–34.0)
MCHC: 33.1 g/dL (ref 30.0–36.0)
MCV: 89.8 fL (ref 78.0–100.0)
Platelets: 275 10*3/uL (ref 150–400)
RBC: 4.11 MIL/uL (ref 3.87–5.11)
RDW: 13.9 % (ref 11.5–15.5)
WBC: 8.1 10*3/uL (ref 4.0–10.5)

## 2016-07-11 MED ORDER — IOPAMIDOL (ISOVUE-300) INJECTION 61%
INTRAVENOUS | Status: AC
  Filled 2016-07-11: qty 30

## 2016-07-11 MED ORDER — ZOLPIDEM TARTRATE 5 MG PO TABS: 5.0000 mg | ORAL_TABLET | Freq: Every evening | ORAL | Status: DC | PRN

## 2016-07-11 MED ORDER — IOPAMIDOL (ISOVUE-300) INJECTION 61%
INTRAVENOUS | Status: AC
  Administered 2016-07-11: 100 mL
  Filled 2016-07-11: qty 100

## 2016-07-11 MED ORDER — IOPAMIDOL (ISOVUE-300) INJECTION 61%
15.0000 mL | Freq: Once | INTRAVENOUS | Status: AC | PRN
  Administered 2016-07-11: 15 mL via ORAL
  Filled 2016-07-11: qty 30

## 2016-07-11 MED ORDER — PIPERACILLIN-TAZOBACTAM 3.375 G IVPB
3.3750 g | Freq: Three times a day (TID) | INTRAVENOUS | Status: DC
  Administered 2016-07-11 – 2016-07-14 (×9): 3.375 g via INTRAVENOUS
  Filled 2016-07-11 (×10): qty 50

## 2016-07-11 NOTE — Progress Notes (Signed)
Date:  Jul 11, 2016  Chart reviewed for concurrent status and case management needs.  Will continue to follow patient progress.  Discharge Planning: following for needs  Expected discharge date: [id number]  Rhonda Davis, BSN, RN3, CCM   [phone]  

## 2016-07-11 NOTE — Progress Notes (Signed)
Patient ID: [name], female   DOB: [birth year], 66 y.o.   MRN: [id number]  [facility]  CT scan shows increased inflammatory changes compared to study 6 days ago.  Also interval development of 2.3 cm fluid collection which may represent early abscess formation.  Recommend resuming IV abx's with broader coverage at this time.  Continue clear liquid diet.  Consider repeat CT scan 3-5 days based on clinical progress to rule out abscess formation.  Will follow.  May yet require operative intervention during this hospitalization.  tmg  Earnstine Regal, MD, [facility] Office: [phone]

## 2016-07-11 NOTE — Progress Notes (Signed)
PROGRESS NOTE    Tracy [name]  WCB:[id number] DOB: November 10, 1950 DOA: 07/05/2016 PCP: [name], MD    Brief Narrative:  Tracy [name] is a 66 y.o. female with medical history significant for GERD and diverticulosis, presenting to the [facility] ED for evaluation of fevers, chills, headache, and abdominal pain which began the day prior. Patient reports that she had been in her usual state of fairly good health until yesterday when she developed fevers, chills, headache, and general malaise. There was some mild lower abdominal tenderness at that time, but the patient went to sleep, hoping that the symptoms would resolve overnight. Unfortunately, upon waking this morning, abdominal pain had worsened and she developed recurrent fevers and chills. She describes her pain as severe, localized to the lower abdomen near the midline, constant, sharp and cramping, associated with 3 loose stools this morning, but no melena or hematochezia. She has not been vomiting. She denies any chest pain, palpitations, dyspnea, or cough. She reports experiencing acute diverticulitis several years ago and describes a current symptoms as similar. Patient saw her primary care physician for evaluation of these complaints, was sent for CT of the abdomen and pelvis, noted to have a sigmoid diverticulitis with microperforation, and was directed to the ED for further evaluation.   Assessment & Plan:   Principal Problem:   Perforation of sigmoid colon due to diverticulitis Active Problems:   GERD   Acute diverticulitis   1-Acute diverticulitis with microperforation;  Change IV Zosyn to oral Augmentin 5-12 .  Continue with pain meds as needed.  Appreciate Surgery evaluation. Plan to repeat CT scan due to persistent abdominal pain.  WBC normal. blood culture, no growth to date, final. Change IV antibiotics to oral 5-12. Continue with clear diet. Had severe cramps, gas last ningt, early this morning. Had smear stool.    2-GERD; PPI.   3-Hypokalemia; resolved.    DVT prophylaxis:  lovenox,.  Code Status: full code.  Family Communication: care discussed with patient.  Disposition Plan: change IV antibiotics to oral    Consultants:   Surgery    Procedures:  none   Antimicrobials:   Zosyn 5-08---5-12  Augmentin 5-12   Subjective: Had a lot gas, cramps last night. Still tender on left lower quadrant.  When pain medications wean out she gets a lot of pain.  On clear diet    Objective: Vitals:   07/10/16 0430 07/10/16 1400 07/10/16 2246 07/11/16 0607  BP: 124/70 (!) 144/67 138/78 120/60  Pulse: 76 78 81 72  Resp: 16 16 16 18   Temp: 98.4 F (36.9 C) 98.4 F (36.9 C) 99.5 F (37.5 C) 97.5 F (36.4 C)  TempSrc: Oral Oral Oral Oral  SpO2: 99% 99% 98% 96%  Weight:      Height:        Intake/Output Summary (Last 24 hours) at 07/11/16 1232 Last data filed at 07/11/16 1219  Gross per 24 hour  Intake             2400 ml  Output              300 ml  Net             2100 ml   Filed Weights   07/05/16 1419 07/05/16 1815  Weight: 76.2 kg (168 lb) 80.1 kg (176 lb 9.4 oz)    Examination:  General exam: NAD Respiratory system: CTA Cardiovascular system: S 2, S 2 RRR Gastrointestinal system; soft, mild left lower quadrant  tenderness.  Central nervous system: non focal exam.  Extremities: symmetric power.  Skin: No rashes, lesions or ulcers     Data Reviewed: I have personally reviewed following labs and imaging studies  CBC:  Recent Labs Lab 07/05/16 1019 07/06/16 0544 07/07/16 0529 07/08/16 0558 07/09/16 0817 07/10/16 0635 07/11/16 0541  WBC 11.2* 9.4 9.9 7.0 6.4 6.8 8.1  NEUTROABS 9.2* 7.9*  --   --   --   --   --   HGB 14.0 12.3 12.7 12.4 12.6 12.5 12.2  HCT 42.0 36.5 37.3 37.4 37.3 36.9 36.9  MCV 90.3 90.1 88.8 89.0 88.6 89.1 89.8  PLT 238.0 188 184 184 239 251 245   Basic Metabolic Panel:  Recent Labs Lab 07/06/16 0544 07/07/16 0529 07/08/16 0558  07/09/16 0817 07/11/16 0541  NA 138 139 140 140 140  K 3.7 3.4* 4.0 3.9 4.2  CL 107 107 110 109 105  CO2 18* 24 23 26 26   GLUCOSE 72 152* 119* 102* 107*  BUN 18 9 7 6 7   CREATININE 0.87 0.73 0.69 0.76 0.76  CALCIUM 8.6* 8.8* 8.8* 9.2 9.6   GFR: Estimated Creatinine Clearance: 71.8 mL/min (by C-G formula based on SCr of 0.76 mg/dL). Liver Function Tests:  Recent Labs Lab 07/05/16 1019 07/06/16 0544  AST 22 23  ALT 16 16  ALKPHOS 67 59  BILITOT 0.8 1.3*  PROT 6.9 6.3*  ALBUMIN 4.4 3.5   No results for input(s): LIPASE, AMYLASE in the last 168 hours. No results for input(s): AMMONIA in the last 168 hours. Coagulation Profile:  Recent Labs Lab 07/06/16 0544  INR 1.13   Cardiac Enzymes: No results for input(s): CKTOTAL, CKMB, CKMBINDEX, TROPONINI in the last 168 hours. BNP (last 3 results) No results for input(s): PROBNP in the last 8760 hours. HbA1C: No results for input(s): HGBA1C in the last 72 hours. CBG: No results for input(s): GLUCAP in the last 168 hours. Lipid Profile: No results for input(s): CHOL, HDL, LDLCALC, TRIG, CHOLHDL, LDLDIRECT in the last 72 hours. Thyroid Function Tests: No results for input(s): TSH, T4TOTAL, FREET4, T3FREE, THYROIDAB in the last 72 hours. Anemia Panel: No results for input(s): VITAMINB12, FOLATE, FERRITIN, TIBC, IRON, RETICCTPCT in the last 72 hours. Sepsis Labs:  Recent Labs Lab 07/05/16 1545  LATICACIDVEN 0.67    Recent Results (from the past 240 hour(s))  Culture, blood (routine x 2)     Status: None   Collection Time: 07/05/16  3:40 PM  Result Value Ref Range Status   Specimen Description BLOOD LEFT ANTECUBITAL  Final   Special Requests   Final    BOTTLES DRAWN AEROBIC AND ANAEROBIC Blood Culture adequate volume   Culture   Final    NO GROWTH 5 DAYS Performed at [facility], [address], [place], [place] [address]    Report Status 07/10/2016 FINAL  Final  Culture, blood (routine x 2)     Status: None    Collection Time: 07/05/16  3:45 PM  Result Value Ref Range Status   Specimen Description BLOOD BLOOD LEFT HAND  Final   Special Requests   Final    BOTTLES DRAWN AEROBIC AND ANAEROBIC Blood Culture adequate volume   Culture   Final    NO GROWTH 5 DAYS Performed at [facility], [place] [address] [place], [place] [address]    Report Status 07/10/2016 FINAL  Final         Radiology Studies: No results found.  Scheduled Meds: . amoxicillin-clavulanate  1 tablet Oral Q12H  . calcium-vitamin D  1 tablet Oral BID  . enoxaparin (LOVENOX) injection  40 mg Subcutaneous Q24H  . fluticasone  1 spray Each Nare Daily  . hydrocortisone cream   Topical BID  . iopamidol      . loratadine  10 mg Oral Daily  . pantoprazole  40 mg Oral QHS  . vitamin B-12  1,000 mcg Oral Daily   Continuous Infusions: . dextrose 5 % and 0.9 % NaCl with KCl 20 mEq/L 100 mL/hr at 07/11/16 0554     LOS: 5 days    Time spent: 25 minutes.     Elmarie Shiley, MD [facility] Pager [phone]  If 7PM-7AM, please contact night-coverage [email or web address] Password [facility] 07/11/2016, 12:32 PM

## 2016-07-11 NOTE — Progress Notes (Signed)
Pharmacy Antibiotic Note  Tracy [name] is a 66 y.o. female admitted on 07/05/2016 with diverticulitis with contained perforation.  Patient was started on IV Zosyn 5/8-5/12 at which point pt was changed to PO Augmentin. Pt with persistent abdominal pain so repeat abdominal CT ordered today.  Results show possible early abscess formation.  Surgery recommended resuming IV antibiotics. Pharmacy has been consulted for Zosyn dosing.  Plan: Zosyn 3.375gm IV q8h (each dose infused over 4 hrs)  Height: 5\' 6"  (167.6 cm) Weight: 176 lb 9.4 oz (80.1 kg) IBW/kg (Calculated) : 59.3  Temp (24hrs), Avg:98.5 F (36.9 C), Min:97.5 F (36.4 C), Max:99.5 F (37.5 C)   Recent Labs Lab 07/05/16 1545  07/07/16 0529 07/08/16 0558 07/09/16 0817 07/10/16 0635 07/11/16 0541 07/11/16 1325  WBC  --   < > 9.9 7.0 6.4 6.8 8.1  --   CREATININE  --   < > 0.73 0.69 0.76  --  0.76 0.81  LATICACIDVEN 0.67  --   --   --   --   --   --   --   < > = values in this interval not displayed.  Estimated Creatinine Clearance: 70.9 mL/min (by C-G formula based on SCr of 0.81 mg/dL).    Allergies  Allergen Reactions  . Demerol [Meperidine] Nausea And Vomiting  . Latex Rash and Other (See Comments)    Reaction:  Blisters     Antimicrobials this admission: 5/8 zosyn >> 5/12; resumed 5/14 >> 5/12 augmentin >> 5/14   Microbiology results: 5/8 BCx: NG (final)  Thank you for allowing pharmacy to be a part of this patient's care.  Everette Rank, PharmD 07/11/2016 3:25 PM

## 2016-07-11 NOTE — Progress Notes (Signed)
  General Surgery [facility]  Assessment & Plan: Acute diverticulitis with micro-perforation, abdominal pain  Tolerating clear liquid diet  Persistent pain LLQ  WBC normal  Consider repeat CT scan today due to persistent pain, tenderness - rule out abscess formation  On oral Augmentin        Earnstine Regal, MD, [facility]       Office: [phone]    Chief Complaint: Acute diverticulitis, abdominal pain  Subjective: Patient in bed, severe pain for 30 minutes last night.  Husband at bedside.  Small liquid BM this AM.  Objective: Vital signs in last 24 hours: Temp:  [97.5 F (36.4 C)-99.5 F (37.5 C)] 97.5 F (36.4 C) (05/14 0607) Pulse Rate:  [72-81] 72 (05/14 0607) Resp:  [16-18] 18 (05/14 0607) BP: (120-144)/(60-78) 120/60 (05/14 0607) SpO2:  [96 %-99 %] 96 % (05/14 0607) Last BM Date: 07/09/16  Intake/Output from previous day: 05/13 0701 - 05/14 0700 In: 1920 [P.O.:720; I.V.:1200] Out: 600 [Urine:600] Intake/Output this shift: Total I/O In: 720 [P.O.:720] Out: -   Physical Exam: HEENT - sclerae clear, mucous membranes moist Neck - soft Abdomen - soft without distension; BS present; tender LLQ with guarding, no mass Ext - no edema, non-tender Neuro - alert & oriented, no focal deficits  Lab Results:   Recent Labs  07/10/16 0635 07/11/16 0541  WBC 6.8 8.1  HGB 12.5 12.2  HCT 36.9 36.9  PLT 251 275   BMET  Recent Labs  07/09/16 0817 07/11/16 0541  NA 140 140  K 3.9 4.2  CL 109 105  CO2 26 26  GLUCOSE 102* 107*  BUN 6 7  CREATININE 0.76 0.76  CALCIUM 9.2 9.6   PT/INR No results for input(s): LABPROT, INR in the last 72 hours. Comprehensive Metabolic Panel:    Component Value Date/Time   NA 140 07/11/2016 0541   NA 140 07/09/2016 0817   K 4.2 07/11/2016 0541   K 3.9 07/09/2016 0817   CL 105 07/11/2016 0541   CL 109 07/09/2016 0817   CO2 26 07/11/2016 0541   CO2 26 07/09/2016 0817    BUN 7 07/11/2016 0541   BUN 6 07/09/2016 0817   CREATININE 0.76 07/11/2016 0541   CREATININE 0.76 07/09/2016 0817   GLUCOSE 107 (H) 07/11/2016 0541   GLUCOSE 102 (H) 07/09/2016 0817   GLUCOSE 97 05/05/2009   CALCIUM 9.6 07/11/2016 0541   CALCIUM 9.2 07/09/2016 0817   AST 23 07/06/2016 0544   AST 22 07/05/2016 1019   ALT 16 07/06/2016 0544   ALT 16 07/05/2016 1019   ALKPHOS 59 07/06/2016 0544   ALKPHOS 67 07/05/2016 1019   BILITOT 1.3 (H) 07/06/2016 0544   BILITOT 0.8 07/05/2016 1019   PROT 6.3 (L) 07/06/2016 0544   PROT 6.9 07/05/2016 1019   ALBUMIN 3.5 07/06/2016 0544   ALBUMIN 4.4 07/05/2016 1019    Studies/Results: No results found.    Sadie Hazelett M 07/11/2016  Patient ID: Tracy [name], female   DOB: 03/12/1950, 66 y.o.   MRN: [id number]

## 2016-07-12 LAB — BASIC METABOLIC PANEL
ANION GAP: 8 (ref 5–15)
BUN: 9 mg/dL (ref 6–20)
CO2: 27 mmol/L (ref 22–32)
Calcium: 9.5 mg/dL (ref 8.9–10.3)
Chloride: 104 mmol/L (ref 101–111)
Creatinine, Ser: 0.89 mg/dL (ref 0.44–1.00)
GFR calc Af Amer: >60 mL/min (ref >60)
GFR calc non Af Amer: >60 mL/min (ref >60)
Glucose, Bld: 103 mg/dL — ABNORMAL HIGH (ref 65–99)
POTASSIUM: 3.9 mmol/L (ref 3.5–5.1)
SODIUM: 139 mmol/L (ref 135–145)

## 2016-07-12 LAB — CBC
HEMATOCRIT: 39 % (ref 36.0–46.0)
Hemoglobin: 12.7 g/dL (ref 12.0–15.0)
MCH: 29.4 pg (ref 26.0–34.0)
MCHC: 32.6 g/dL (ref 30.0–36.0)
MCV: 90.3 fL (ref 78.0–100.0)
Platelets: 328 10*3/uL (ref 150–400)
RBC: 4.32 MIL/uL (ref 3.87–5.11)
RDW: 13.6 % (ref 11.5–15.5)
WBC: 5.9 10*3/uL (ref 4.0–10.5)

## 2016-07-12 LAB — T4, FREE: FREE T4: 1.38 ng/dL — AB (ref 0.61–1.12)

## 2016-07-12 MED ORDER — DIPHENHYDRAMINE-ZINC ACETATE 2-0.1 % EX CREA
TOPICAL_CREAM | Freq: Three times a day (TID) | CUTANEOUS | Status: DC | PRN
  Administered 2016-07-12 – 2016-07-15 (×2): via TOPICAL
  Filled 2016-07-12: qty 28.4

## 2016-07-12 MED ORDER — BOOST / RESOURCE BREEZE PO LIQD
1.0000 | Freq: Three times a day (TID) | ORAL | Status: DC
  Administered 2016-07-12 – 2016-07-17 (×10): 1 via ORAL

## 2016-07-12 NOTE — Progress Notes (Signed)
PROGRESS NOTE    Tracy [name]  OIN:[id number] DOB: 1950/08/16 DOA: 07/05/2016 PCP: [name], MD    Brief Narrative:  Tracy [name] is a 66 y.o. female with medical history significant for GERD and diverticulosis, presenting to the [facility] ED for evaluation of fevers, chills, headache, and abdominal pain which began the day prior. Patient reports that she had been in her usual state of fairly good health until yesterday when she developed fevers, chills, headache, and general malaise. There was some mild lower abdominal tenderness at that time, but the patient went to sleep, hoping that the symptoms would resolve overnight. Unfortunately, upon waking this morning, abdominal pain had worsened and she developed recurrent fevers and chills. She describes her pain as severe, localized to the lower abdomen near the midline, constant, sharp and cramping, associated with 3 loose stools this morning, but no melena or hematochezia She reports experiencing acute diverticulitis several years ago and describes a current symptoms as similar. Patient saw her primary care physician for evaluation of these complaints, was sent for CT of the abdomen and pelvis, noted to have a sigmoid diverticulitis with microperforation, and was directed to the ED for further evaluation.  Patient admitted with acute diverticulitis and microperforation, she was started on IV Zosyn, subsequently started on clear diet. Patient was transition to oral antibiotics 5-12. She continue to have abdominal pain. A ct scan was repeated 5-14 which showed worsening diverticulitis, and  developing abscess. Patient was started on IV antibiotics again 5-14.   Plan is to repeat CT abdomen pelvis in 4 days, patient might required sx during this admission.   Assessment & Plan:   Principal Problem:   Perforation of sigmoid colon due to diverticulitis Active Problems:   GERD   Acute diverticulitis   1-Acute diverticulitis with  microperforation;  Continue with pain meds as needed.  Repeated CT abdomen pelvis 5-14; showed worsening diverticulitis, possible developing abscess,  Appreciate Surgery evaluation. Plan to repeat CT abdomen pelvis in 4 days.  WBC normal. blood culture, no growth to date, final.   2-GERD; PPI.   3-Hypokalemia; resolved.   4-history of thyroid abnormalities.  Report night sweats.  Check Free T 3, T 4 and TSH.   DVT prophylaxis:  lovenox,.  Code Status: full code.  Family Communication: care discussed with patient.  Disposition Plan: change IV antibiotics to oral    Consultants:   Surgery    Procedures:  none   Antimicrobials:   Zosyn 5-08---5-12  Augmentin 5-12   Subjective: She is worry, anxious.  Report some improvement of abdominal pain. Having diarrhea since she drank the contrast yesterday.  Report profuse sweat last night. She has history of thyroid problems.    Objective: Vitals:   07/11/16 0607 07/11/16 1451 07/11/16 2047 07/12/16 1415  BP: 120/60 138/70 123/71 119/63  Pulse: 72 77 81 82  Resp: 18 19 18 18   Temp: 97.5 F (36.4 C) 98.6 F (37 C) 99.1 F (37.3 C) 98.8 F (37.1 C)  TempSrc: Oral Oral Oral Oral  SpO2: 96% 99% 97% 97%  Weight:      Height:        Intake/Output Summary (Last 24 hours) at 07/12/16 1515 Last data filed at 07/12/16 1319  Gross per 24 hour  Intake          1101.67 ml  Output                0 ml  Net  1101.67 ml   Filed Weights   07/05/16 1419 07/05/16 1815  Weight: 76.2 kg (168 lb) 80.1 kg (176 lb 9.4 oz)    Examination:  General exam: NAD Respiratory system: CTA Cardiovascular system: S 2, S 2 RRR Gastrointestinal system; Soft, mild left lower quadrant tenderness.  Central nervous system: non focal.  Extremities: symmetric power Skin: No rashes, lesions or ulcers     Data Reviewed: I have personally reviewed following labs and imaging studies  CBC:  Recent Labs Lab 07/06/16 0544   07/08/16 0558 07/09/16 0817 07/10/16 0635 07/11/16 0541 07/12/16 0622  WBC 9.4  < > 7.0 6.4 6.8 8.1 5.9  NEUTROABS 7.9*  --   --   --   --   --   --   HGB 12.3  < > 12.4 12.6 12.5 12.2 12.7  HCT 36.5  < > 37.4 37.3 36.9 36.9 39.0  MCV 90.1  < > 89.0 88.6 89.1 89.8 90.3  PLT 188  < > 184 239 251 275 328  < > = values in this interval not displayed. Basic Metabolic Panel:  Recent Labs Lab 07/08/16 0558 07/09/16 0817 07/11/16 0541 07/11/16 1325 07/12/16 0719  NA 140 140 140 137 139  K 4.0 3.9 4.2 4.2 3.9  CL 110 109 105 102 104  CO2 23 26 26 27 27   GLUCOSE 119* 102* 107* 103* 103*  BUN 7 6 7 8 9   CREATININE 0.69 0.76 0.76 0.81 0.89  CALCIUM 8.8* 9.2 9.6 9.8 9.5   GFR: Estimated Creatinine Clearance: 64.6 mL/min (by C-G formula based on SCr of 0.89 mg/dL). Liver Function Tests:  Recent Labs Lab 07/06/16 0544  AST 23  ALT 16  ALKPHOS 59  BILITOT 1.3*  PROT 6.3*  ALBUMIN 3.5   No results for input(s): LIPASE, AMYLASE in the last 168 hours. No results for input(s): AMMONIA in the last 168 hours. Coagulation Profile:  Recent Labs Lab 07/06/16 0544  INR 1.13   Cardiac Enzymes: No results for input(s): CKTOTAL, CKMB, CKMBINDEX, TROPONINI in the last 168 hours. BNP (last 3 results) No results for input(s): PROBNP in the last 8760 hours. HbA1C: No results for input(s): HGBA1C in the last 72 hours. CBG: No results for input(s): GLUCAP in the last 168 hours. Lipid Profile: No results for input(s): CHOL, HDL, LDLCALC, TRIG, CHOLHDL, LDLDIRECT in the last 72 hours. Thyroid Function Tests:  Recent Labs  07/12/16 1144  FREET4 1.38*   Anemia Panel: No results for input(s): VITAMINB12, FOLATE, FERRITIN, TIBC, IRON, RETICCTPCT in the last 72 hours. Sepsis Labs:  Recent Labs Lab 07/05/16 1545  LATICACIDVEN 0.67    Recent Results (from the past 240 hour(s))  Culture, blood (routine x 2)     Status: None   Collection Time: 07/05/16  3:40 PM  Result Value  Ref Range Status   Specimen Description BLOOD LEFT ANTECUBITAL  Final   Special Requests   Final    BOTTLES DRAWN AEROBIC AND ANAEROBIC Blood Culture adequate volume   Culture   Final    NO GROWTH 5 DAYS Performed at [facility], [address] [place], [place] [address]    Report Status 07/10/2016 FINAL  Final  Culture, blood (routine x 2)     Status: None   Collection Time: 07/05/16  3:45 PM  Result Value Ref Range Status   Specimen Description BLOOD BLOOD LEFT HAND  Final   Special Requests   Final    BOTTLES DRAWN AEROBIC AND  ANAEROBIC Blood Culture adequate volume   Culture   Final    NO GROWTH 5 DAYS Performed at [facility], [place] [address], [place], [place] [address]    Report Status 07/10/2016 FINAL  Final         Radiology Studies: Ct Abdomen Pelvis W Contrast  Result Date: 07/11/2016 CLINICAL DATA:  History of GERD and diverticulosis. Fever, chills, headache, abdominal pain started 1 day prior. Mid abdominal tenderness. History of perforated diverticulitis with similar symptoms. EXAM: CT ABDOMEN AND PELVIS WITH CONTRAST TECHNIQUE: Multidetector CT imaging of the abdomen and pelvis was performed using the standard protocol following bolus administration of intravenous contrast. CONTRAST:  100 ISOVUE-300 IOPAMIDOL (ISOVUE-300) INJECTION 61% COMPARISON:  07/05/2016 FINDINGS: Lower chest: No acute abnormality. Hepatobiliary: Status post cholecystectomy. The liver is homogeneous. Small probable hemangioma is identified within the anterior segment of the right hepatic lobe measuring 0.8 x 0.7 cm on image 30 of series 2. No suspicious liver lesions are identified. Pancreas: Unremarkable. No pancreatic ductal dilatation or surrounding inflammatory changes. Spleen: Normal in size without focal abnormality. Adrenals/Urinary Tract: Left adrenal nodule is stable in appearance, 2.1 x 2.6 cm. Right adrenal gland is normal in appearance. Kidneys are symmetric in enhancement and  excretion. No renal mass. Stomach/Bowel: Small hiatal hernia. The stomach and small bowel loops are normal in appearance. Status post appendectomy. There is extensive moderate inflammatory change surrounding the sigmoid colon, progressed since the prior study. Focal wall thickening of sigmoid colon measures up to 1.9 cm best seen on image 63 of series 2. A small air/ fluid collection is identified adjacent to an adjacent small bowel loop in is new since the prior study. This measures 2.0 x 2.3 cm. Early abscess should be considered. Vascular/Lymphatic: No significant adenopathy. Reproductive: Uterus is present.  No suspicious adnexal mass. Other: Trace free pelvic fluid.  No abdominal wall hernia. Musculoskeletal: Degenerative changes are seen in the spine and both hips. IMPRESSION: 1. Further progression of sigmoid diverticulitis. Small interloop air/fluid collection consistent with microperforation and possible early abscess. 2. No free intraperitoneal air. 3. Status post cholecystectomy and appendectomy. 4. Small hemangioma within the right hepatic lobe. 5. Stable left adrenal nodule consistent with benign lesion. 6. Small hiatal hernia. These results will be called to the ordering clinician or representative by the Radiologist Assistant, and communication documented in the PACS or zVision Dashboard. Electronically Signed   By: Nolon Nations M.D.   On: 07/11/2016 14:43        Scheduled Meds: . calcium-vitamin D  1 tablet Oral BID  . enoxaparin (LOVENOX) injection  40 mg Subcutaneous Q24H  . feeding supplement  1 Container Oral TID BM  . fluticasone  1 spray Each Nare Daily  . hydrocortisone cream   Topical BID  . loratadine  10 mg Oral Daily  . pantoprazole  40 mg Oral QHS  . vitamin B-12  1,000 mcg Oral Daily   Continuous Infusions: . dextrose 5 % and 0.9 % NaCl with KCl 20 mEq/L 100 mL/hr at 07/12/16 1505  . piperacillin-tazobactam (ZOSYN)  IV Stopped (07/12/16 1259)     LOS: 6 days     Time spent: 25 minutes.     Elmarie Shiley, MD [facility] Pager [phone]  If 7PM-7AM, please contact night-coverage [email or web address] Password [email or web address] 07/12/2016, 3:15 PM

## 2016-07-12 NOTE — Progress Notes (Signed)
Initial Nutrition Assessment  DOCUMENTATION CODES:   Not applicable  INTERVENTION:   Boost Breeze po TID, each supplement provides 250 kcal and 9 grams of protein  NUTRITION DIAGNOSIS:   Inadequate oral intake related to acute illness as evidenced by other (see comment) (clear liquid diet).  GOAL:   Patient will meet greater than or equal to 90% of their needs  MONITOR:   PO intake, Supplement acceptance, Labs, Weight trends  REASON FOR ASSESSMENT:   NPO/Clear Liquid Diet    ASSESSMENT:   66 y.o. female admitted on 07/05/2016 with diverticulitis with contained perforation.   Met with pt in room today. Pt reports good appetite and oral intake pta. Pt reports fair appetite today but reports intermittent nausea. Pt eating 100% clear liquid diet and tolerating it thus far. RD discussed with pt the importance of adequate protein intake. RD also discussed limiting fiber and fat from diet during times of flare ups. Pt would like to try Boost Breeze; RD will order. Per chart, pt is weight stable.   Medications reviewed and include: Ca-Vit D, lovenox, protonix, Vit B-12, zosyn  Labs reviewed  Nutrition-Focused physical exam completed. Findings are no fat depletion, no muscle depletion, and no edema.   Diet Order:  Diet clear liquid Room service appropriate? Yes; Fluid consistency: Thin  Skin:  Reviewed, no issues  Last BM:  5/14  Height:   Ht Readings from Last 1 Encounters:  07/05/16 _0  (1.676 m)    Weight:   Wt Readings from Last 1 Encounters:  07/05/16 176 lb 9.4 oz (80.1 kg)    Ideal Body Weight:  59 kg  BMI:  Body mass index is 28.5 kg/m.  Estimated Nutritional Needs:   Kcal:  1600-1900kcal/day   Protein:  88-104g/day   Fluid:  >1.6L/day   EDUCATION NEEDS:   Education needs no appropriate at this time  Koleen Distance MS, RD, [place] Pager #- [phone]

## 2016-07-12 NOTE — Progress Notes (Signed)
Patient ID: Tracy [name], female   DOB: 04-Jan-1951, 66 y.o.   MRN: [id number]  [facility] Surgery Progress Note     Subjective: CC- diverticulitis, abdominal pain Sitting up in bed. No family at bedside. Patient reports persistent lower abdominal pain. CT yesterday showed increased inflammatory changes compared to study 6 days ago, as well as interval development of 2.3 cm fluid collection which may represent early abscess formation. She was started back on zosyn last night. States that she has had more loose stool today.  Objective: Vital signs in last 24 hours: Temp:  [98.6 F (37 C)-99.1 F (37.3 C)] 99.1 F (37.3 C) (05/14 2047) Pulse Rate:  [77-81] 81 (05/14 2047) Resp:  [18-19] 18 (05/14 2047) BP: (123-138)/(70-71) 123/71 (05/14 2047) SpO2:  [97 %-99 %] 97 % (05/14 2047) Last BM Date: 07/11/16  Intake/Output from previous day: 05/14 0701 - 05/15 0700 In: 2081.7 [P.O.:960; I.V.:1071.7; IV Piggyback:50] Out: -  Intake/Output this shift: No intake/output data recorded.  PE: Gen:  Alert, NAD, pleasant Card:  RRR, no M/G/R heard Pulm:  CTAB, no W/R/R, effort normal Abd: Soft, ND, +BS, no HSM, no hernia, moderate LLQ TTP Ext:  No erythema, edema, or tenderness BUE/BLE   Lab Results:   Recent Labs  07/11/16 0541 07/12/16 0622  WBC 8.1 5.9  HGB 12.2 12.7  HCT 36.9 39.0  PLT 275 328   BMET  Recent Labs  07/11/16 0541 07/11/16 1325  NA 140 137  K 4.2 4.2  CL 105 102  CO2 26 27  GLUCOSE 107* 103*  BUN 7 8  CREATININE 0.76 0.81  CALCIUM 9.6 9.8   PT/INR No results for input(s): LABPROT, INR in the last 72 hours. CMP     Component Value Date/Time   NA 137 07/11/2016 1325   K 4.2 07/11/2016 1325   CL 102 07/11/2016 1325   CO2 27 07/11/2016 1325   GLUCOSE 103 (H) 07/11/2016 1325   GLUCOSE 97 05/05/2009   BUN 8 07/11/2016 1325   CREATININE 0.81 07/11/2016 1325   CALCIUM 9.8 07/11/2016 1325   PROT 6.3 (L) 07/06/2016 0544   ALBUMIN 3.5  07/06/2016 0544   AST 23 07/06/2016 0544   ALT 16 07/06/2016 0544   ALKPHOS 59 07/06/2016 0544   BILITOT 1.3 (H) 07/06/2016 0544   GFRNONAA >60 07/11/2016 1325   GFRAA >60 07/11/2016 1325   Lipase  No results found for: LIPASE     Studies/Results: Ct Abdomen Pelvis W Contrast  Result Date: 07/11/2016 CLINICAL DATA:  History of GERD and diverticulosis. Fever, chills, headache, abdominal pain started 1 day prior. Mid abdominal tenderness. History of perforated diverticulitis with similar symptoms. EXAM: CT ABDOMEN AND PELVIS WITH CONTRAST TECHNIQUE: Multidetector CT imaging of the abdomen and pelvis was performed using the standard protocol following bolus administration of intravenous contrast. CONTRAST:  100 ISOVUE-300 IOPAMIDOL (ISOVUE-300) INJECTION 61% COMPARISON:  07/05/2016 FINDINGS: Lower chest: No acute abnormality. Hepatobiliary: Status post cholecystectomy. The liver is homogeneous. Small probable hemangioma is identified within the anterior segment of the right hepatic lobe measuring 0.8 x 0.7 cm on image 30 of series 2. No suspicious liver lesions are identified. Pancreas: Unremarkable. No pancreatic ductal dilatation or surrounding inflammatory changes. Spleen: Normal in size without focal abnormality. Adrenals/Urinary Tract: Left adrenal nodule is stable in appearance, 2.1 x 2.6 cm. Right adrenal gland is normal in appearance. Kidneys are symmetric in enhancement and excretion. No renal mass. Stomach/Bowel: Small hiatal hernia. The stomach and small bowel loops are  normal in appearance. Status post appendectomy. There is extensive moderate inflammatory change surrounding the sigmoid colon, progressed since the prior study. Focal wall thickening of sigmoid colon measures up to 1.9 cm best seen on image 63 of series 2. A small air/ fluid collection is identified adjacent to an adjacent small bowel loop in is new since the prior study. This measures 2.0 x 2.3 cm. Early abscess should be  considered. Vascular/Lymphatic: No significant adenopathy. Reproductive: Uterus is present.  No suspicious adnexal mass. Other: Trace free pelvic fluid.  No abdominal wall hernia. Musculoskeletal: Degenerative changes are seen in the spine and both hips. IMPRESSION: 1. Further progression of sigmoid diverticulitis. Small interloop air/fluid collection consistent with microperforation and possible early abscess. 2. No free intraperitoneal air. 3. Status post cholecystectomy and appendectomy. 4. Small hemangioma within the right hepatic lobe. 5. Stable left adrenal nodule consistent with benign lesion. 6. Small hiatal hernia. These results will be called to the ordering clinician or representative by the Radiologist Assistant, and communication documented in the PACS or zVision Dashboard. Electronically Signed   By: Nolon Nations M.D.   On: 07/11/2016 14:43    Anti-infectives: Anti-infectives    Start     Dose/Rate Route Frequency Ordered Stop   07/11/16 1600  piperacillin-tazobactam (ZOSYN) IVPB 3.375 g     3.375 g 12.5 mL/hr over 240 Minutes Intravenous Every 8 hours 07/11/16 1524     07/09/16 1230  amoxicillin-clavulanate (AUGMENTIN) 875-125 MG per tablet 1 tablet  Status:  Discontinued     1 tablet Oral Every 12 hours 07/09/16 1218 07/11/16 1454   07/06/16 0000  piperacillin-tazobactam (ZOSYN) IVPB 3.375 g  Status:  Discontinued     3.375 g 12.5 mL/hr over 240 Minutes Intravenous Every 8 hours 07/05/16 1843 07/09/16 1218   07/05/16 1545  piperacillin-tazobactam (ZOSYN) IVPB 3.375 g     3.375 g 100 mL/hr over 30 Minutes Intravenous  Once 07/05/16 1534 07/05/16 1638   07/05/16 1530  piperacillin-tazobactam (ZOSYN) IVPB 4.5 g  Status:  Discontinued     4.5 g 200 mL/hr over 30 Minutes Intravenous  Once 07/05/16 1523 07/05/16 1534       Assessment/Plan Acute diverticulitis - admitted 07/05/16 - initial CT scan showed sigmoid diverticulitis with small contained micro perforation - repeat CT  scan 5/14 showed increased inflammatory changes, as well as interval development of 2.3 cm fluid collection which may represent early abscess formation - WBC 5.9, TMAX 99.1 today  GERD  ID - zosyn 5/8>>5/12 and 5/15>>, Augmentin 5/12>>5/14 FEN - IVF, CLD VTE - SCDs, lovenox  Plan - Reviewed CT findings with patient. Continue broad spectrum antibiotics and clear liquid diet. If patient progresses well will likely plan to repeat CT scan in 3-4 days, if she starts to decline may have to consider operative intervention during this hospitalization. Will continue to follow.   LOS: 6 days    Jerrye Beavers , [facility] Surgery 07/12/2016, 7:54 AM Pager: [phone] Consults: [phone] Mon-Fri 7:00 am-4:30 pm Sat-Sun 7:00 am-11:30 am

## 2016-07-13 LAB — COMPREHENSIVE METABOLIC PANEL
ALBUMIN: 3.4 g/dL — AB (ref 3.5–5.0)
ALK PHOS: 71 U/L (ref 38–126)
ALT: 16 U/L (ref 14–54)
ANION GAP: 6 (ref 5–15)
AST: 22 U/L (ref 15–41)
BUN: 6 mg/dL (ref 6–20)
CALCIUM: 9.3 mg/dL (ref 8.9–10.3)
CO2: 27 mmol/L (ref 22–32)
Chloride: 108 mmol/L (ref 101–111)
Creatinine, Ser: 0.82 mg/dL (ref 0.44–1.00)
GFR calc Af Amer: >60 mL/min (ref >60)
GFR calc non Af Amer: >60 mL/min (ref >60)
GLUCOSE: 161 mg/dL — AB (ref 65–99)
Potassium: 4 mmol/L (ref 3.5–5.1)
SODIUM: 141 mmol/L (ref 135–145)
Total Bilirubin: 0.5 mg/dL (ref 0.3–1.2)
Total Protein: 6.6 g/dL (ref 6.5–8.1)

## 2016-07-13 LAB — T3, FREE: T3 FREE: 3 pg/mL (ref 2.0–4.4)

## 2016-07-13 MED ORDER — MELATONIN 5 MG PO TABS: 1.0000 | ORAL_TABLET | Freq: Every evening | ORAL | Status: DC | PRN

## 2016-07-13 NOTE — Progress Notes (Signed)
PROGRESS NOTE    Tracy [name]  JQB:[id number] DOB: 04/19/1950 DOA: 07/05/2016 PCP: [name], MD    Brief Narrative:  Tracy [name] is a 66 y.o. female with medical history significant for GERD and diverticulosis, presenting to the [facility] ED for evaluation of fevers, chills, headache, and abdominal pain which began the day prior. Patient reports that she had been in her usual state of fairly good health until yesterday when she developed fevers, chills, headache, and general malaise. There was some mild lower abdominal tenderness at that time, but the patient went to sleep, hoping that the symptoms would resolve overnight. Unfortunately, upon waking this morning, abdominal pain had worsened and she developed recurrent fevers and chills. She describes her pain as severe, localized to the lower abdomen near the midline, constant, sharp and cramping, associated with 3 loose stools this morning, but no melena or hematochezia She reports experiencing acute diverticulitis several years ago and describes a current symptoms as similar. Patient saw her primary care physician for evaluation of these complaints, was sent for CT of the abdomen and pelvis, noted to have a sigmoid diverticulitis with microperforation, and was directed to the ED for further evaluation.  Patient admitted with acute diverticulitis and microperforation. She was started on IV Zosyn. Patient was transition to oral antibiotics 5-12 however, continued to have abdominal pain. A CT scan was repeated 5-14 which showed worsening diverticulitis, and  developing abscess she was subsequently restarted on IV antibiotics on 5-14.   Plan is to repeat CT abdomen pelvis in 4 days, patient might required sx during this admission.   Subjective: Complaints of at least 8 watery bowel movements this morning. Nonbloody. No other associated symptoms. Drinking clear liquids without any trouble. No abdominal pain. Urinating normally. Not  lightheaded on ambulating.   Assessment & Plan:   Acute diverticulitis with microperforation;  Repeated CT abdomen pelvis 5-14 showed worsening diverticulitis, possible developing abscess,  Appreciate Surgery evaluation. Plan to repeat CT abdomen pelvis on 5/18  Continue clear liquids for now Blood cultures negative  Diarrhea -States that she's had diarrhea for a few days and at least 8 episodes of diarrhea by the time I saw her at 10:00 this morning-stool is watery-no associated abdominal pain nausea or vomiting-no blood noted in stool -We'll obtain C. difficile PCR and if negative, can try antimotility agents -Follow I and O closely-continue IV fluids to prevent dehydration  GERD - PPI.   Hypokalemia - resolved.   History of thyroid abnormalities.  Report night sweats.   Free T 3, T 4 and TSH ordered-I would recommend rechecking these as an outpatient as acute illness will predispose to sick euthyroid syndrome and result in a misdiagnosis  DVT prophylaxis:  lovenox,.  Code Status: full code.  Family Communication: care discussed with patient and husband at bedside  Disposition Plan: Continue to follow on MedSurg   Consultants:   Surgery    Procedures:  none   Antimicrobials:   Zosyn 5-08---5-12  Augmentin 5-12   Objective: Vitals:   07/12/16 1415 07/12/16 2148 07/13/16 0500 07/13/16 1417  BP: 119/63 (!) 125/56 128/71 (!) 134/57  Pulse: 82 82 70 80  Resp: 18 18 18 16   Temp: 98.8 F (37.1 C) 98.6 F (37 C) 98.2 F (36.8 C) 98.6 F (37 C)  TempSrc: Oral Oral Oral Oral  SpO2: 97% 99% 97% 97%  Weight:      Height:        Intake/Output Summary (Last 24 hours)  at 07/13/16 1433 Last data filed at 07/13/16 1338  Gross per 24 hour  Intake              960 ml  Output              900 ml  Net               60 ml   Filed Weights   07/05/16 1419 07/05/16 1815  Weight: 76.2 kg (168 lb) 80.1 kg (176 lb 9.4 oz)    Examination:  General exam:  NAD Respiratory system: CTA Cardiovascular system: S 2, S 2 RRR Gastrointestinal system; Soft, Tender in suprapubic area and left lower quadrant Central nervous system: non focal.  Extremities: symmetric power Skin: No rashes, lesions or ulcers     Data Reviewed: I have personally reviewed following labs and imaging studies  CBC:  Recent Labs Lab 07/08/16 0558 07/09/16 0817 07/10/16 0635 07/11/16 0541 07/12/16 0622  WBC 7.0 6.4 6.8 8.1 5.9  HGB 12.4 12.6 12.5 12.2 12.7  HCT 37.4 37.3 36.9 36.9 39.0  MCV 89.0 88.6 89.1 89.8 90.3  PLT 184 239 251 275 222   Basic Metabolic Panel:  Recent Labs Lab 07/09/16 0817 07/11/16 0541 07/11/16 1325 07/12/16 0719 07/13/16 1322  NA 140 140 137 139 141  K 3.9 4.2 4.2 3.9 4.0  CL 109 105 102 104 108  CO2 26 26 27 27 27   GLUCOSE 102* 107* 103* 103* 161*  BUN 6 7 8 9 6   CREATININE 0.76 0.76 0.81 0.89 0.82  CALCIUM 9.2 9.6 9.8 9.5 9.3   GFR: Estimated Creatinine Clearance: 70.1 mL/min (by C-G formula based on SCr of 0.82 mg/dL). Liver Function Tests:  Recent Labs Lab 07/13/16 1322  AST 22  ALT 16  ALKPHOS 71  BILITOT 0.5  PROT 6.6  ALBUMIN 3.4*   No results for input(s): LIPASE, AMYLASE in the last 168 hours. No results for input(s): AMMONIA in the last 168 hours. Coagulation Profile: No results for input(s): INR, PROTIME in the last 168 hours. Cardiac Enzymes: No results for input(s): CKTOTAL, CKMB, CKMBINDEX, TROPONINI in the last 168 hours. BNP (last 3 results) No results for input(s): PROBNP in the last 8760 hours. HbA1C: No results for input(s): HGBA1C in the last 72 hours. CBG: No results for input(s): GLUCAP in the last 168 hours. Lipid Profile: No results for input(s): CHOL, HDL, LDLCALC, TRIG, CHOLHDL, LDLDIRECT in the last 72 hours. Thyroid Function Tests:  Recent Labs  07/12/16 1112 07/12/16 1144  FREET4  --  1.38*  T3FREE 3.0  --    Anemia Panel: No results for input(s): VITAMINB12, FOLATE,  FERRITIN, TIBC, IRON, RETICCTPCT in the last 72 hours. Sepsis Labs: No results for input(s): PROCALCITON, LATICACIDVEN in the last 168 hours.  Recent Results (from the past 240 hour(s))  Culture, blood (routine x 2)     Status: None   Collection Time: 07/05/16  3:40 PM  Result Value Ref Range Status   Specimen Description BLOOD LEFT ANTECUBITAL  Final   Special Requests   Final    BOTTLES DRAWN AEROBIC AND ANAEROBIC Blood Culture adequate volume   Culture   Final    NO GROWTH 5 DAYS Performed at [facility], [address] [place], [place] [address]    Report Status 07/10/2016 FINAL  Final  Culture, blood (routine x 2)     Status: None   Collection Time: 07/05/16  3:45 PM  Result Value Ref Range  Status   Specimen Description BLOOD BLOOD LEFT HAND  Final   Special Requests   Final    BOTTLES DRAWN AEROBIC AND ANAEROBIC Blood Culture adequate volume   Culture   Final    NO GROWTH 5 DAYS Performed at [facility], [address] [place], [place] [address]    Report Status 07/10/2016 FINAL  Final         Radiology Studies: No results found.      Scheduled Meds: . calcium-vitamin D  1 tablet Oral BID  . enoxaparin (LOVENOX) injection  40 mg Subcutaneous Q24H  . feeding supplement  1 Container Oral TID BM  . fluticasone  1 spray Each Nare Daily  . hydrocortisone cream   Topical BID  . loratadine  10 mg Oral Daily  . pantoprazole  40 mg Oral QHS  . vitamin B-12  1,000 mcg Oral Daily   Continuous Infusions: . dextrose 5 % and 0.9 % NaCl with KCl 20 mEq/L 125 mL/hr at 07/13/16 1026  . piperacillin-tazobactam (ZOSYN)  IV Stopped (07/13/16 1227)     LOS: 7 days    Time spent: 25 minutes.     Debbe Odea, MD [facility] Pager [phone]  If 7PM-7AM, please contact night-coverage [email or web address] Password [facility] 07/13/2016, 2:33 PM

## 2016-07-13 NOTE — Progress Notes (Signed)
    CC: abdominal pain with diverticultis  Subjective: Still very tender, but better than on admit. Tolerating clears, but having diarrhea.    Objective: Vital signs in last 24 hours: Temp:  [98.2 F (36.8 C)-98.8 F (37.1 C)] 98.2 F (36.8 C) (05/16 0500) Pulse Rate:  [70-82] 70 (05/16 0500) Resp:  [18] 18 (05/16 0500) BP: (119-128)/(56-71) 128/71 (05/16 0500) SpO2:  [97 %-99 %] 97 % (05/16 0500) Last BM Date: 07/13/16 1020 Po Urine x 9 Stool x 1 Afebrile, VSS No labs Repeat CT 5/14:  Further progression of sigmoid diverticulitis. Small interloop air/fluid collection consistent with microperforation and possible early abscess. Intake/Output from previous day: 05/15 0701 - 05/16 0700 In: 1020 [P.O.:1020] Out: -  Intake/Output this shift: Total I/O In: 480 [P.O.:480] Out: 500 [Urine:500]  General appearance: alert, cooperative and no distress GI: soft, still very tender mid and LLQ of her abdomen.   Chest:  clear Lab Results:   Recent Labs  07/11/16 0541 07/12/16 0622  WBC 8.1 5.9  HGB 12.2 12.7  HCT 36.9 39.0  PLT 275 328    BMET  Recent Labs  07/11/16 1325 07/12/16 0719  NA 137 139  K 4.2 3.9  CL 102 104  CO2 27 27  GLUCOSE 103* 103*  BUN 8 9  CREATININE 0.81 0.89  CALCIUM 9.8 9.5   PT/INR No results for input(s): LABPROT, INR in the last 72 hours.  No results for input(s): AST, ALT, ALKPHOS, BILITOT, PROT, ALBUMIN in the last 168 hours.   Lipase  No results found for: LIPASE   Medications: . calcium-vitamin D  1 tablet Oral BID  . enoxaparin (LOVENOX) injection  40 mg Subcutaneous Q24H  . feeding supplement  1 Container Oral TID BM  . fluticasone  1 spray Each Nare Daily  . hydrocortisone cream   Topical BID  . loratadine  10 mg Oral Daily  . pantoprazole  40 mg Oral QHS  . vitamin B-12  1,000 mcg Oral Daily    Assessment/Plan Acute diverticulitis  GERD ID:  zosyn 5/8>>5/12 and 5/15>>, Augmentin 5/12>>5/14  -  07/12/16 =>>  Zosyn FEN - IVF, CLD VTE - SCDs, lovenox  Plan:   Continue antibiotics and clears.  IV fluids, recheck labs in AM.      LOS: 7 days    Colson Barco 07/13/2016 [phone]

## 2016-07-14 DIAGNOSIS — A0472 Enterocolitis due to Clostridium difficile, not specified as recurrent: Secondary | ICD-10-CM

## 2016-07-14 LAB — BASIC METABOLIC PANEL
ANION GAP: 8 (ref 5–15)
BUN: <5 mg/dL — ABNORMAL LOW (ref 6–20)
CALCIUM: 9.3 mg/dL (ref 8.9–10.3)
CO2: 25 mmol/L (ref 22–32)
Chloride: 108 mmol/L (ref 101–111)
Creatinine, Ser: 0.74 mg/dL (ref 0.44–1.00)
GFR calc Af Amer: >60 mL/min (ref >60)
GLUCOSE: 106 mg/dL — AB (ref 65–99)
POTASSIUM: 3.8 mmol/L (ref 3.5–5.1)
SODIUM: 141 mmol/L (ref 135–145)

## 2016-07-14 LAB — CBC
HCT: 35.9 % — ABNORMAL LOW (ref 36.0–46.0)
HEMOGLOBIN: 11.9 g/dL — AB (ref 12.0–15.0)
MCH: 30.1 pg (ref 26.0–34.0)
MCHC: 33.1 g/dL (ref 30.0–36.0)
MCV: 90.7 fL (ref 78.0–100.0)
PLATELETS: 361 10*3/uL (ref 150–400)
RBC: 3.96 MIL/uL (ref 3.87–5.11)
RDW: 13.5 % (ref 11.5–15.5)
WBC: 7 10*3/uL (ref 4.0–10.5)

## 2016-07-14 LAB — C DIFFICILE QUICK SCREEN W PCR REFLEX
C DIFFICILE (CDIFF) TOXIN: NEGATIVE
C Diff antigen: POSITIVE — AB

## 2016-07-14 LAB — CLOSTRIDIUM DIFFICILE BY PCR: CDIFFPCR: POSITIVE — AB

## 2016-07-14 LAB — TSH: TSH: 0.24 u[IU]/mL — AB (ref 0.350–4.500)

## 2016-07-14 LAB — PREALBUMIN: PREALBUMIN: 12.9 mg/dL — AB (ref 18–38)

## 2016-07-14 MED ORDER — CIPROFLOXACIN IN D5W 400 MG/200ML IV SOLN: 400.0000 mg | Freq: Two times a day (BID) | INTRAVENOUS | Status: DC

## 2016-07-14 MED ORDER — VANCOMYCIN 50 MG/ML ORAL SOLUTION
125.0000 mg | Freq: Four times a day (QID) | ORAL | Status: DC
  Administered 2016-07-14 – 2016-07-17 (×13): 125 mg via ORAL
  Filled 2016-07-14 (×16): qty 2.5

## 2016-07-14 MED ORDER — PIPERACILLIN-TAZOBACTAM 3.375 G IVPB
3.3750 g | Freq: Three times a day (TID) | INTRAVENOUS | Status: DC
  Administered 2016-07-14 – 2016-07-17 (×9): 3.375 g via INTRAVENOUS
  Filled 2016-07-14 (×10): qty 50

## 2016-07-14 NOTE — Care Management Important Message (Signed)
Important Message  Patient Details IM Letter given to Rhonda/Case Manager to present to Patient Name: KONNER WARRIOR MRN: [id number] Date of Birth: 1950-11-07   Medicare Important Message Given:  Yes    Kerin Salen 07/14/2016, 12:54 [place] Message  Patient Details  Name: LING FLESCH MRN: [id number] Date of Birth: 10-Sep-1950   Medicare Important Message Given:  Yes    Kerin Salen 07/14/2016, 12:52 PM

## 2016-07-14 NOTE — Progress Notes (Addendum)
PROGRESS NOTE    Tracy [name]  GXQ:[id number] DOB: [birth year] DOA: 07/05/2016 PCP: [name], MD    Brief Narrative:  Tracy [name] is a 66 y.o. female with medical history significant for GERD and diverticulosis, presenting to the [facility] ED for evaluation of fevers, chills, headache, and abdominal pain which began the day prior. Patient reports that she had been in her usual state of fairly good health until yesterday when she developed fevers, chills, headache, and general malaise. There was some mild lower abdominal tenderness at that time, but the patient went to sleep, hoping that the symptoms would resolve overnight. Unfortunately, upon waking this morning, abdominal pain had worsened and she developed recurrent fevers and chills. She describes her pain as severe, localized to the lower abdomen near the midline, constant, sharp and cramping, associated with 3 loose stools this morning, but no melena or hematochezia She reports experiencing acute diverticulitis several years ago and describes a current symptoms as similar. Patient saw her primary care physician for evaluation of these complaints, was sent for CT of the abdomen and pelvis, noted to have a sigmoid diverticulitis with microperforation, and was directed to the ED for further evaluation.  Patient admitted with acute diverticulitis and microperforation. She was started on IV Zosyn. Patient was transition to oral antibiotics 5-12 however, continued to have abdominal pain. A CT scan was repeated 5-14 which showed worsening diverticulitis, and  developing abscess she was subsequently restarted on IV antibiotics on 5-14.   Plan is to repeat CT abdomen pelvis in 4 days, patient might required sx during this admission.   Subjective: Diarrhea not as severe as yesterday. Still having mild headaches. No nausea or vomiting. Abdominal pain is improving.    Assessment & Plan:   Acute diverticulitis with microperforation;   Repeated CT abdomen pelvis 5-14 showed worsening diverticulitis, possible developing abscess - abdominal tenderness improving - Plan to repeat CT abdomen/ pelvis on 5/18  Continue clear liquids for now Blood cultures negative  Diarrhea -States that she's had diarrhea for a few days and at least 8 episodes of diarrhea yesterday and a few this AM-stool is watery-no associated abdominal pain nausea or vomiting-no blood noted in stool - C. difficile panel obtained >>  antigen +, toxin negative, PCR +  due to ongoing diarrhea, will need to start treatment for C diff with Vancomycin -Follow I and O closely-continue IV fluids to prevent dehydration  GERD - PPI.   Hypokalemia - resolved.   History of thyroid abnormalities.  Report night sweats.   Free T 3, T 4 and TSH ordered and slightly abnormal  I would recommend rechecking these as an outpatient as acute illness will predispose to sick euthyroid syndrome and result in a misdiagnosis TSH 0.240   Triiodothyronine,Free,Serum 3.0   T4,Free(Direct)  1.38    DVT prophylaxis:  lovenox,.  Code Status: full code.  Family Communication: care discussed with patient and husband at bedside  Disposition Plan: Continue to follow on MedSurg   Consultants:   Surgery    Procedures:  none   Antimicrobials:  Anti-infectives    Start     Dose/Rate Route Frequency Ordered Stop   07/11/16 1600  piperacillin-tazobactam (ZOSYN) IVPB 3.375 g     3.375 g 12.5 mL/hr over 240 Minutes Intravenous Every 8 hours 07/11/16 1524     07/09/16 1230  amoxicillin-clavulanate (AUGMENTIN) 875-125 MG per tablet 1 tablet  Status:  Discontinued     1 tablet Oral Every 12 hours  07/09/16 1218 07/11/16 1454   07/06/16 0000  piperacillin-tazobactam (ZOSYN) IVPB 3.375 g  Status:  Discontinued     3.375 g 12.5 mL/hr over 240 Minutes Intravenous Every 8 hours 07/05/16 1843 07/09/16 1218   07/05/16 1545  piperacillin-tazobactam (ZOSYN) IVPB 3.375 g     3.375 g 100  mL/hr over 30 Minutes Intravenous  Once 07/05/16 1534 07/05/16 1638   07/05/16 1530  piperacillin-tazobactam (ZOSYN) IVPB 4.5 g  Status:  Discontinued     4.5 g 200 mL/hr over 30 Minutes Intravenous  Once 07/05/16 1523 07/05/16 1534         Objective: Vitals:   07/13/16 0500 07/13/16 1417 07/13/16 2136 07/14/16 0550  BP: 128/71 (!) 134/57 118/64 (!) 148/70  Pulse: 70 80 74 73  Resp: 18 16 17 20   Temp: 98.2 F (36.8 C) 98.6 F (37 C) 98.8 F (37.1 C) 98.1 F (36.7 C)  TempSrc: Oral Oral Oral Oral  SpO2: 97% 97% 98% 95%  Weight:      Height:        Intake/Output Summary (Last 24 hours) at 07/14/16 1115 Last data filed at 07/14/16 0908  Gross per 24 hour  Intake              840 ml  Output             4500 ml  Net            -3660 ml   Filed Weights   07/05/16 1419 07/05/16 1815  Weight: 76.2 kg (168 lb) 80.1 kg (176 lb 9.4 oz)    Examination:  General exam: NAD Respiratory system: CTA Cardiovascular system: S 2, S 2 RRR Gastrointestinal system; Soft, Tender in suprapubic area and left lower quadrant - less severe than yesterday Central nervous system: non focal.  Extremities: symmetric power Skin: No rashes, lesions or ulcers  Data Reviewed: I have personally reviewed following labs and imaging studies  CBC:  Recent Labs Lab 07/09/16 0817 07/10/16 0635 07/11/16 0541 07/12/16 0622 07/14/16 0611  WBC 6.4 6.8 8.1 5.9 7.0  HGB 12.6 12.5 12.2 12.7 11.9*  HCT 37.3 36.9 36.9 39.0 35.9*  MCV 88.6 89.1 89.8 90.3 90.7  PLT 239 251 275 328 161   Basic Metabolic Panel:  Recent Labs Lab 07/11/16 0541 07/11/16 1325 07/12/16 0719 07/13/16 1322 07/14/16 0611  NA 140 137 139 141 141  K 4.2 4.2 3.9 4.0 3.8  CL 105 102 104 108 108  CO2 26 27 27 27 25   GLUCOSE 107* 103* 103* 161* 106*  BUN 7 8 9 6  <5*  CREATININE 0.76 0.81 0.89 0.82 0.74  CALCIUM 9.6 9.8 9.5 9.3 9.3   GFR: Estimated Creatinine Clearance: 71.8 mL/min (by C-G formula based on SCr of 0.74  mg/dL). Liver Function Tests:  Recent Labs Lab 07/13/16 1322  AST 22  ALT 16  ALKPHOS 71  BILITOT 0.5  PROT 6.6  ALBUMIN 3.4*   No results for input(s): LIPASE, AMYLASE in the last 168 hours. No results for input(s): AMMONIA in the last 168 hours. Coagulation Profile: No results for input(s): INR, PROTIME in the last 168 hours. Cardiac Enzymes: No results for input(s): CKTOTAL, CKMB, CKMBINDEX, TROPONINI in the last 168 hours. BNP (last 3 results) No results for input(s): PROBNP in the last 8760 hours. HbA1C: No results for input(s): HGBA1C in the last 72 hours. CBG: No results for input(s): GLUCAP in the last 168 hours. Lipid Profile: No results for input(s): CHOL, HDL, LDLCALC,  TRIG, CHOLHDL, LDLDIRECT in the last 72 hours. Thyroid Function Tests:  Recent Labs  07/12/16 1112 07/12/16 1144 07/14/16 0611  TSH  --   --  0.240*  FREET4  --  1.38*  --   T3FREE 3.0  --   --    Anemia Panel: No results for input(s): VITAMINB12, FOLATE, FERRITIN, TIBC, IRON, RETICCTPCT in the last 72 hours. Sepsis Labs: No results for input(s): PROCALCITON, LATICACIDVEN in the last 168 hours.  Recent Results (from the past 240 hour(s))  Culture, blood (routine x 2)     Status: None   Collection Time: 07/05/16  3:40 PM  Result Value Ref Range Status   Specimen Description BLOOD LEFT ANTECUBITAL  Final   Special Requests   Final    BOTTLES DRAWN AEROBIC AND ANAEROBIC Blood Culture adequate volume   Culture   Final    NO GROWTH 5 DAYS Performed at [facility], [address] [place], [place] [address]    Report Status 07/10/2016 FINAL  Final  Culture, blood (routine x 2)     Status: None   Collection Time: 07/05/16  3:45 PM  Result Value Ref Range Status   Specimen Description BLOOD BLOOD LEFT HAND  Final   Special Requests   Final    BOTTLES DRAWN AEROBIC AND ANAEROBIC Blood Culture adequate volume   Culture   Final    NO GROWTH 5 DAYS Performed at [place], [place] [address] [place], [place] [address]    Report Status 07/10/2016 FINAL  Final  Culture, blood (Routine X 2) w Reflex to ID Panel     Status: None (Preliminary result)   Collection Time: 07/12/16 11:12 AM  Result Value Ref Range Status   Specimen Description BLOOD RIGHT ANTECUBITAL  Final   Special Requests   Final    BOTTLES DRAWN AEROBIC ONLY Blood Culture adequate volume   Culture   Final    NO GROWTH 2 DAYS Performed at [facility], [place] [address], [place], [place] [address]    Report Status PENDING  Incomplete  Culture, blood (Routine X 2) w Reflex to ID Panel     Status: None (Preliminary result)   Collection Time: 07/12/16 11:44 AM  Result Value Ref Range Status   Specimen Description BLOOD RIGHT ARM  Final   Special Requests IN PEDIATRIC BOTTLE Blood Culture adequate volume  Final   Culture   Final    NO GROWTH 2 DAYS Performed at [facility], [place] [address], [place], [place] [address]    Report Status PENDING  Incomplete  C difficile quick scan w PCR reflex     Status: Abnormal   Collection Time: 07/14/16  8:10 AM  Result Value Ref Range Status   C Diff antigen POSITIVE (A) NEGATIVE Final   C Diff toxin NEGATIVE NEGATIVE Final   C Diff interpretation Results are indeterminate. See PCR results.  Final         Radiology Studies: No results found.      Scheduled Meds: . calcium-vitamin D  1 tablet Oral BID  . enoxaparin (LOVENOX) injection  40 mg Subcutaneous Q24H  . feeding supplement  1 Container Oral TID BM  . fluticasone  1 spray Each Nare Daily  . hydrocortisone cream   Topical BID  . loratadine  10 mg Oral Daily  . pantoprazole  40 mg Oral QHS  . vitamin B-12  1,000 mcg Oral Daily   Continuous  Infusions: . dextrose 5 % and 0.9 % NaCl with KCl 20 mEq/L 125 mL/hr at 07/13/16 1026  . piperacillin-tazobactam (ZOSYN)  IV 3.375 g (07/14/16 0849)     LOS: 8 days    Time spent: 25 minutes.     Debbe Odea, MD [facility] Pager [phone]  If 7PM-7AM, please contact night-coverage [email or web address] Password [email or web address] 07/14/2016, 11:15 AM

## 2016-07-14 NOTE — Progress Notes (Signed)
[facility] Progress Note     Subjective: CC: abdominal pain Pt denies any abdominal pain at rest. Fever, cold sweats, and nausea have resolved. She reports 4 episodes of diarrhea this morning, but states she doesn't have BMs in the afternoon/evening. Denies blood or mucous in her stool.   Afebrile, VSS Objective: Vital signs in last 24 hours: Temp:  [98.1 F (36.7 C)-98.8 F (37.1 C)] 98.1 F (36.7 C) (05/17 0550) Pulse Rate:  [73-80] 73 (05/17 0550) Resp:  [16-20] 20 (05/17 0550) BP: (118-148)/(57-70) 148/70 (05/17 0550) SpO2:  [95 %-98 %] 95 % (05/17 0550) Last BM Date: 07/13/16  Intake/Output from previous day: 05/16 0701 - 05/17 0700 In: 960 [P.O.:960] Out: 4600 [Urine:4600] Intake/Output this shift: Total I/O In: 360 [P.O.:360] Out: 300 [Urine:300]  PE: Gen:  Alert, NAD, pleasant Card:  Regular rate and rhythm, pedal pulses 2+ BL Pulm:  Normal effort, clear to auscultation bilaterally Abd: Soft, tender to deep palpation LLQ without peritonitis, bowel sounds present in all 4 quadrants Skin: warm and dry, no rashes  Psych: A&Ox3   Lab Results:   Recent Labs  07/12/16 0622 07/14/16 0611  WBC 5.9 7.0  HGB 12.7 11.9*  HCT 39.0 35.9*  PLT 328 361   BMET  Recent Labs  07/13/16 1322 07/14/16 0611  NA 141 141  K 4.0 3.8  CL 108 108  CO2 27 25  GLUCOSE 161* 106*  BUN 6 <5*  CREATININE 0.82 0.74  CALCIUM 9.3 9.3   PT/INR No results for input(s): LABPROT, INR in the last 72 hours. CMP     Component Value Date/Time   NA 141 07/14/2016 0611   K 3.8 07/14/2016 0611   CL 108 07/14/2016 0611   CO2 25 07/14/2016 0611   GLUCOSE 106 (H) 07/14/2016 0611   GLUCOSE 97 05/05/2009   BUN <5 (L) 07/14/2016 0611   CREATININE 0.74 07/14/2016 0611   CALCIUM 9.3 07/14/2016 0611   PROT 6.6 07/13/2016 1322   ALBUMIN 3.4 (L) 07/13/2016 1322   AST 22 07/13/2016 1322   ALT 16 07/13/2016 1322   ALKPHOS 71 07/13/2016 1322   BILITOT 0.5 07/13/2016 1322    GFRNONAA >60 07/14/2016 0611   GFRAA >60 07/14/2016 0611   Lipase  No results found for: LIPASE     Studies/Results: No results found.  Anti-infectives: Anti-infectives    Start     Dose/Rate Route Frequency Ordered Stop   07/11/16 1600  piperacillin-tazobactam (ZOSYN) IVPB 3.375 g     3.375 g 12.5 mL/hr over 240 Minutes Intravenous Every 8 hours 07/11/16 1524     07/09/16 1230  amoxicillin-clavulanate (AUGMENTIN) 875-125 MG per tablet 1 tablet  Status:  Discontinued     1 tablet Oral Every 12 hours 07/09/16 1218 07/11/16 1454   07/06/16 0000  piperacillin-tazobactam (ZOSYN) IVPB 3.375 g  Status:  Discontinued     3.375 g 12.5 mL/hr over 240 Minutes Intravenous Every 8 hours 07/05/16 1843 07/09/16 1218   07/05/16 1545  piperacillin-tazobactam (ZOSYN) IVPB 3.375 g     3.375 g 100 mL/hr over 30 Minutes Intravenous  Once 07/05/16 1534 07/05/16 1638   07/05/16 1530  piperacillin-tazobactam (ZOSYN) IVPB 4.5 g  Status:  Discontinued     4.5 g 200 mL/hr over 30 Minutes Intravenous  Once 07/05/16 1523 07/05/16 1534     Assessment/Plan Acute diverticulitis - afebrile, leukocytosis resolved - clinically improving, mild tenderness to deep palpation of LLQ, otherwise nontender - tolerating clears, may advance to full  liquids this afternoon - agree with CT scan Abd/pelv tomorrow AM   GERD ID:  zosyn 5/8>>5/12 and 5/15>>, Augmentin 5/12>>5/14, 07/12/16 =>> Zosyn FEN - IVF, CLD VTE - SCDs, lovenox  Plan: full liquids, CT ABD/PELV in AM     LOS: 8 days    Jill Alexanders , [facility] 07/14/2016, 11:00 AM Pager: [phone] Consults: [phone] Mon-Fri 7:00 am-4:30 pm Sat-Sun 7:00 am-11:30 am

## 2016-07-14 NOTE — Progress Notes (Addendum)
Pharmacy Antibiotic Note  Tracy [name] is a 66 y.o. female admitted on 07/05/2016 with diverticulitis with contained perforation.  Patient was started on IV Zosyn 5/8-5/12 at which point pt was changed to PO Augmentin. Pt with persistent abdominal pain so repeat abdominal CT ordered today.  Results show possible early abscess formation.  Surgery recommended resuming IV antibiotics. Pharmacy has been consulted for Zosyn dosing.  Today, 07/14/2016:  WBC/temps WNL  New diarrhea noted, Cdiff pending  Plan for re-imaging 5/18  Plan:  Continue Zosyn 3.375gm IV q8h (each dose infused over 4 hrs)  As renal function appears stable, Pharmacy will sign off and follow peripherally for culture results and LOT.  Height: 5\' 6"  (167.6 cm) Weight: 176 lb 9.4 oz (80.1 kg) IBW/kg (Calculated) : 59.3  Temp (24hrs), Avg:98.5 F (36.9 C), Min:98.1 F (36.7 C), Max:98.8 F (37.1 C)   Recent Labs Lab 07/09/16 0817 07/10/16 0635 07/11/16 0541 07/11/16 1325 07/12/16 0622 07/12/16 0719 07/13/16 1322 07/14/16 0611  WBC 6.4 6.8 8.1  --  5.9  --   --  7.0  CREATININE 0.76  --  0.76 0.81  --  0.89 0.82 0.74    Estimated Creatinine Clearance: 71.8 mL/min (by C-G formula based on SCr of 0.74 mg/dL).    Allergies  Allergen Reactions  . Demerol [Meperidine] Nausea And Vomiting  . Latex Rash and Other (See Comments)    Reaction:  Blisters     Antimicrobials this admission: 5/8 zosyn >> 5/12; resumed 5/14 >> 5/12 augmentin >> 5/14   Microbiology results: 5/8 BCx: NGF 5/15 repeat BCx: ngtd 5/16 Cdiff: needs collected  Thank you for allowing pharmacy to be a part of this patient's care.  Reuel Boom, PharmD, BCPS Pager: [phone] 07/14/2016, 7:29 AM

## 2016-07-15 ENCOUNTER — Inpatient Hospital Stay (HOSPITAL_COMMUNITY): Payer: Medicare Other

## 2016-07-15 ENCOUNTER — Encounter (HOSPITAL_COMMUNITY): Payer: Self-pay | Admitting: Radiology

## 2016-07-15 MED ORDER — IOPAMIDOL (ISOVUE-300) INJECTION 61%
30.0000 mL | Freq: Once | INTRAVENOUS | Status: AC
  Administered 2016-07-15: 30 mL via ORAL

## 2016-07-15 MED ORDER — IOPAMIDOL (ISOVUE-300) INJECTION 61%
INTRAVENOUS | Status: AC
  Filled 2016-07-15: qty 30

## 2016-07-15 MED ORDER — IOPAMIDOL (ISOVUE-300) INJECTION 61%
INTRAVENOUS | Status: AC
  Filled 2016-07-15: qty 100

## 2016-07-15 MED ORDER — SACCHAROMYCES BOULARDII 250 MG PO CAPS
250.0000 mg | ORAL_CAPSULE | Freq: Two times a day (BID) | ORAL | Status: DC
  Administered 2016-07-15 – 2016-07-17 (×5): 250 mg via ORAL
  Filled 2016-07-15 (×5): qty 1

## 2016-07-15 MED ORDER — IOPAMIDOL (ISOVUE-300) INJECTION 61%
100.0000 mL | Freq: Once | INTRAVENOUS | Status: AC | PRN
  Administered 2016-07-15: 100 mL via INTRAVENOUS

## 2016-07-15 NOTE — Progress Notes (Signed)
PROGRESS NOTE    Tracy [name]  NWG:[id number] DOB: 1950-11-12 DOA: 07/05/2016 PCP: [name], MD    Brief Narrative:  Tracy [name] is a 66 y.o. female with medical history significant for GERD and diverticulosis, presenting to the [facility] ED for evaluation of fevers, chills, headache, and abdominal pain which began the day prior. Patient reports that she had been in her usual state of fairly good health until yesterday when she developed fevers, chills, headache, and general malaise. There was some mild lower abdominal tenderness at that time, but the patient went to sleep, hoping that the symptoms would resolve overnight. Unfortunately, upon waking this morning, abdominal pain had worsened and she developed recurrent fevers and chills. She describes her pain as severe, localized to the lower abdomen near the midline, constant, sharp and cramping, associated with 3 loose stools this morning, but no melena or hematochezia She reports experiencing acute diverticulitis several years ago and describes a current symptoms as similar. Patient saw her primary care physician for evaluation of these complaints, was sent for CT of the abdomen and pelvis, noted to have a sigmoid diverticulitis with microperforation, and was directed to the ED for further evaluation.  Patient admitted with acute diverticulitis and microperforation. She was started on IV Zosyn. Patient was transition to oral antibiotics 5-12 however, continued to have abdominal pain. A CT scan was repeated 5-14 which showed worsening diverticulitis, and  developing abscess she was subsequently restarted on IV antibiotics on 5-14.   Plan is to repeat CT abdomen pelvis in 4 days, patient might required sx during this admission.   Subjective: 2 BMs today. No longer liquid. Abdominal pain nearly resolved. No nausea.    Assessment & Plan:   Acute diverticulitis with microperforation;  - Repeated CT abdomen pelvis 5-14 showed  worsening diverticulitis, possible developing abscess - Blood cultures negative - abdominal tenderness improving - Plan to repeat CT abdomen/ pelvis today - full liquids- will consider advancing based upon CT results.    Diarrhea -States that she's had diarrhea for a few days and at least 8 episodes of diarrhea on 5/16 -stool is watery-no associated nausea or vomiting-no blood noted in stool - C. difficile panel obtained >>  antigen +, toxin negative, PCR + - due to ongoing diarrhea, will need to start treatment for C diff with Vancomycin -Follow I and O closely-continue IV fluids to prevent dehydration  GERD - PPI.   Hypokalemia - resolved.   History of thyroid abnormalities.  Report night sweats.   Free T 3, T 4 and TSH ordered and slightly abnormal  I would recommend rechecking these as an outpatient as acute illness will predispose to sick euthyroid syndrome and result in a misdiagnosis TSH 0.240   Triiodothyronine,Free,Serum 3.0   T4,Free(Direct)  1.38    DVT prophylaxis:  lovenox,.  Code Status: full code.  Family Communication: care discussed with patient and husband at bedside  Disposition Plan: Continue to follow on MedSurg   Consultants:   Surgery    Procedures:  none   Antimicrobials:  Anti-infectives    Start     Dose/Rate Route Frequency Ordered Stop   07/14/16 1600  piperacillin-tazobactam (ZOSYN) IVPB 3.375 g     3.375 g 12.5 mL/hr over 240 Minutes Intravenous Every 8 hours 07/14/16 1423     07/14/16 1500  vancomycin (VANCOCIN) 50 mg/mL oral solution 125 mg     125 mg Oral 4 times daily 07/14/16 1404 07/28/16 1359   07/14/16 1415  ciprofloxacin (CIPRO) IVPB 400 mg  Status:  Discontinued     400 mg 200 mL/hr over 60 Minutes Intravenous Every 12 hours 07/14/16 1404 07/14/16 1423   07/11/16 1600  piperacillin-tazobactam (ZOSYN) IVPB 3.375 g  Status:  Discontinued     3.375 g 12.5 mL/hr over 240 Minutes Intravenous Every 8 hours 07/11/16 1524  07/14/16 1404   07/09/16 1230  amoxicillin-clavulanate (AUGMENTIN) 875-125 MG per tablet 1 tablet  Status:  Discontinued     1 tablet Oral Every 12 hours 07/09/16 1218 07/11/16 1454   07/06/16 0000  piperacillin-tazobactam (ZOSYN) IVPB 3.375 g  Status:  Discontinued     3.375 g 12.5 mL/hr over 240 Minutes Intravenous Every 8 hours 07/05/16 1843 07/09/16 1218   07/05/16 1545  piperacillin-tazobactam (ZOSYN) IVPB 3.375 g     3.375 g 100 mL/hr over 30 Minutes Intravenous  Once 07/05/16 1534 07/05/16 1638   07/05/16 1530  piperacillin-tazobactam (ZOSYN) IVPB 4.5 g  Status:  Discontinued     4.5 g 200 mL/hr over 30 Minutes Intravenous  Once 07/05/16 1523 07/05/16 1534        Objective: Vitals:   07/14/16 0550 07/14/16 1443 07/14/16 2056 07/15/16 0557  BP: (!) 148/70 134/67 (!) 144/57 136/74  Pulse: 73 70 72 72  Resp: 20 20 16 19   Temp: 98.1 F (36.7 C) 98.1 F (36.7 C) 98.4 F (36.9 C) 98.4 F (36.9 C)  TempSrc: Oral Oral Oral Oral  SpO2: 95% 98% 98% 98%  Weight:      Height:        Intake/Output Summary (Last 24 hours) at 07/15/16 1138 Last data filed at 07/15/16 1100  Gross per 24 hour  Intake              240 ml  Output             3450 ml  Net            -3210 ml   Filed Weights   07/05/16 1419 07/05/16 1815  Weight: 76.2 kg (168 lb) 80.1 kg (176 lb 9.4 oz)    Examination:  General exam: NAD Respiratory system: CTA Cardiovascular system: S 2, S 2 RRR Gastrointestinal system; Soft, mildly Tender in suprapubic area and left lower quadrant  Central nervous system: non focal.  Extremities: symmetric power Skin: No rashes, lesions or ulcers  Data Reviewed: I have personally reviewed following labs and imaging studies  CBC:  Recent Labs Lab 07/09/16 0817 07/10/16 0635 07/11/16 0541 07/12/16 0622 07/14/16 0611  WBC 6.4 6.8 8.1 5.9 7.0  HGB 12.6 12.5 12.2 12.7 11.9*  HCT 37.3 36.9 36.9 39.0 35.9*  MCV 88.6 89.1 89.8 90.3 90.7  PLT 239 251 275 328 761    Basic Metabolic Panel:  Recent Labs Lab 07/11/16 0541 07/11/16 1325 07/12/16 0719 07/13/16 1322 07/14/16 0611  NA 140 137 139 141 141  K 4.2 4.2 3.9 4.0 3.8  CL 105 102 104 108 108  CO2 26 27 27 27 25   GLUCOSE 107* 103* 103* 161* 106*  BUN 7 8 9 6  <5*  CREATININE 0.76 0.81 0.89 0.82 0.74  CALCIUM 9.6 9.8 9.5 9.3 9.3   GFR: Estimated Creatinine Clearance: 71.8 mL/min (by C-G formula based on SCr of 0.74 mg/dL). Liver Function Tests:  Recent Labs Lab 07/13/16 1322  AST 22  ALT 16  ALKPHOS 71  BILITOT 0.5  PROT 6.6  ALBUMIN 3.4*   No results for input(s): LIPASE, AMYLASE in the last 168  hours. No results for input(s): AMMONIA in the last 168 hours. Coagulation Profile: No results for input(s): INR, PROTIME in the last 168 hours. Cardiac Enzymes: No results for input(s): CKTOTAL, CKMB, CKMBINDEX, TROPONINI in the last 168 hours. BNP (last 3 results) No results for input(s): PROBNP in the last 8760 hours. HbA1C: No results for input(s): HGBA1C in the last 72 hours. CBG: No results for input(s): GLUCAP in the last 168 hours. Lipid Profile: No results for input(s): CHOL, HDL, LDLCALC, TRIG, CHOLHDL, LDLDIRECT in the last 72 hours. Thyroid Function Tests:  Recent Labs  07/12/16 1144 07/14/16 0611  TSH  --  0.240*  FREET4 1.38*  --    Anemia Panel: No results for input(s): VITAMINB12, FOLATE, FERRITIN, TIBC, IRON, RETICCTPCT in the last 72 hours. Sepsis Labs: No results for input(s): PROCALCITON, LATICACIDVEN in the last 168 hours.  Recent Results (from the past 240 hour(s))  Culture, blood (routine x 2)     Status: None   Collection Time: 07/05/16  3:40 PM  Result Value Ref Range Status   Specimen Description BLOOD LEFT ANTECUBITAL  Final   Special Requests   Final    BOTTLES DRAWN AEROBIC AND ANAEROBIC Blood Culture adequate volume   Culture   Final    NO GROWTH 5 DAYS Performed at [facility], [address], [place], [place] [address]     Report Status 07/10/2016 FINAL  Final  Culture, blood (routine x 2)     Status: None   Collection Time: 07/05/16  3:45 PM  Result Value Ref Range Status   Specimen Description BLOOD BLOOD LEFT HAND  Final   Special Requests   Final    BOTTLES DRAWN AEROBIC AND ANAEROBIC Blood Culture adequate volume   Culture   Final    NO GROWTH 5 DAYS Performed at [facility], [place] [address] [place], [place] [address]    Report Status 07/10/2016 FINAL  Final  Culture, blood (Routine X 2) w Reflex to ID Panel     Status: None (Preliminary result)   Collection Time: 07/12/16 11:12 AM  Result Value Ref Range Status   Specimen Description BLOOD RIGHT ANTECUBITAL  Final   Special Requests   Final    BOTTLES DRAWN AEROBIC ONLY Blood Culture adequate volume   Culture   Final    NO GROWTH 2 DAYS Performed at [facility], [place] [address], [place], [place] [address]    Report Status PENDING  Incomplete  Culture, blood (Routine X 2) w Reflex to ID Panel     Status: None (Preliminary result)   Collection Time: 07/12/16 11:44 AM  Result Value Ref Range Status   Specimen Description BLOOD RIGHT ARM  Final   Special Requests IN PEDIATRIC BOTTLE Blood Culture adequate volume  Final   Culture   Final    NO GROWTH 2 DAYS Performed at [facility], [place] [address] [place], [place] [address]    Report Status PENDING  Incomplete  C difficile quick scan w PCR reflex     Status: Abnormal   Collection Time: 07/14/16  8:10 AM  Result Value Ref Range Status   C Diff antigen POSITIVE (A) NEGATIVE Final   C Diff toxin NEGATIVE NEGATIVE Final   C Diff interpretation Results are indeterminate. See PCR results.  Final  Clostridium Difficile by PCR     Status: Abnormal   Collection Time: 07/14/16  8:10 AM  Result Value Ref Range Status  Toxigenic C Difficile by pcr POSITIVE (A) NEGATIVE Final    Comment: Positive for toxigenic C. difficile with little to no toxin production. Only treat if clinical  presentation suggests symptomatic illness. Performed at [facility], [place] [address] [place], [place] [address]          Radiology Studies: No results found.      Scheduled Meds: . calcium-vitamin D  1 tablet Oral BID  . enoxaparin (LOVENOX) injection  40 mg Subcutaneous Q24H  . feeding supplement  1 Container Oral TID BM  . fluticasone  1 spray Each Nare Daily  . hydrocortisone cream   Topical BID  . iopamidol      . loratadine  10 mg Oral Daily  . pantoprazole  40 mg Oral QHS  . saccharomyces boulardii  250 mg Oral BID  . vancomycin  125 mg Oral QID  . vitamin B-12  1,000 mcg Oral Daily   Continuous Infusions: . dextrose 5 % and 0.9 % NaCl with KCl 20 mEq/L 125 mL/hr at 07/15/16 0731  . piperacillin-tazobactam (ZOSYN)  IV 3.375 g (07/15/16 0732)     LOS: 9 days    Time spent: 25 minutes.     Debbe Odea, MD [facility] Pager [phone]  If 7PM-7AM, please contact night-coverage [email or web address] Password [facility] 07/15/2016, 11:38 AM

## 2016-07-15 NOTE — Progress Notes (Signed)
    CC:  Abdominal pain  Subjective: She continues to improve daily. He has no pain this a.m. She reports she had a bowel movement which was more solid. She is tolerating full liquids well.  Objective: Vital signs in last 24 hours: Temp:  [98.1 F (36.7 C)-98.4 F (36.9 C)] 98.4 F (36.9 C) (05/18 0557) Pulse Rate:  [70-72] 72 (05/18 0557) Resp:  [16-20] 19 (05/18 0557) BP: (134-144)/(57-74) 136/74 (05/18 0557) SpO2:  [98 %] 98 % (05/18 0557) Last BM Date: 07/14/16 600 by mouth 3050 urine BM 3 Afebrile vital signs are stable. No labs Plan repeat CT scan today. Intake/Output from previous day: 05/17 0701 - 05/18 0700 In: 600 [P.O.:600] Out: 3050 [Urine:3050] Intake/Output this shift: No intake/output data recorded.  General appearance: alert, cooperative and no distress Resp: clear to auscultation bilaterally GI: soft, non-tender; bowel sounds normal; no masses,  no organomegaly  Lab Results:   Recent Labs  07/14/16 0611  WBC 7.0  HGB 11.9*  HCT 35.9*  PLT 361    BMET  Recent Labs  07/13/16 1322 07/14/16 0611  NA 141 141  K 4.0 3.8  CL 108 108  CO2 27 25  GLUCOSE 161* 106*  BUN 6 <5*  CREATININE 0.82 0.74  CALCIUM 9.3 9.3   PT/INR No results for input(s): LABPROT, INR in the last 72 hours.   Recent Labs Lab 07/13/16 1322  AST 22  ALT 16  ALKPHOS 71  BILITOT 0.5  PROT 6.6  ALBUMIN 3.4*     Lipase  No results found for: LIPASE   Medications: . calcium-vitamin D  1 tablet Oral BID  . enoxaparin (LOVENOX) injection  40 mg Subcutaneous Q24H  . feeding supplement  1 Container Oral TID BM  . fluticasone  1 spray Each Nare Daily  . hydrocortisone cream   Topical BID  . loratadine  10 mg Oral Daily  . pantoprazole  40 mg Oral QHS  . vancomycin  125 mg Oral QID  . vitamin B-12  1,000 mcg Oral Daily   . dextrose 5 % and 0.9 % NaCl with KCl 20 mEq/L 125 mL/hr at 07/15/16 0731  . piperacillin-tazobactam (ZOSYN)  IV 3.375 g (07/15/16  0732)    Assessment/Plan  Acute diverticulitis  GERD ID:  zosyn 5/8>>5/12 and 5/15>>, Augmentin 5/12>>5/14  -  07/12/16 =>> Zosyn day 5 FEN - IVF, Full liquids VTE - SCDs, lovenox   Plan: Repeat CT scan today.   LOS: 9 days    Sheryle Vice 07/15/2016 [phone]

## 2016-07-16 DIAGNOSIS — A0472 Enterocolitis due to Clostridium difficile, not specified as recurrent: Secondary | ICD-10-CM

## 2016-07-16 LAB — BASIC METABOLIC PANEL
Anion gap: 6 (ref 5–15)
BUN: 6 mg/dL (ref 6–20)
CALCIUM: 9.2 mg/dL (ref 8.9–10.3)
CO2: 26 mmol/L (ref 22–32)
Chloride: 109 mmol/L (ref 101–111)
Creatinine, Ser: 0.75 mg/dL (ref 0.44–1.00)
GFR calc Af Amer: >60 mL/min (ref >60)
GLUCOSE: 96 mg/dL (ref 65–99)
POTASSIUM: 3.9 mmol/L (ref 3.5–5.1)
SODIUM: 141 mmol/L (ref 135–145)

## 2016-07-16 MED ORDER — FAMOTIDINE 20 MG PO TABS
20.0000 mg | ORAL_TABLET | Freq: Two times a day (BID) | ORAL | Status: DC
  Administered 2016-07-16 – 2016-07-17 (×3): 20 mg via ORAL
  Filled 2016-07-16 (×3): qty 1

## 2016-07-16 MED ORDER — LOPERAMIDE HCL 2 MG PO CAPS: 2.0000 mg | ORAL_CAPSULE | ORAL | Status: DC | PRN

## 2016-07-16 MED ORDER — LOPERAMIDE HCL 2 MG PO CAPS
4.0000 mg | ORAL_CAPSULE | Freq: Once | ORAL | Status: AC
  Administered 2016-07-16: 4 mg via ORAL
  Filled 2016-07-16: qty 2

## 2016-07-16 NOTE — Progress Notes (Signed)
PROGRESS NOTE    Tracy [name]  RSW:[id number] DOB: 04-16-1950 DOA: 07/05/2016 PCP: [name], MD    Brief Narrative:  Tracy [name] is a 66 y.o. female with medical history significant for GERD and diverticulosis, presenting to the [facility] ED for evaluation of fevers, chills, headache, and abdominal pain which began the day prior. Patient reports that she had been in her usual state of fairly good health until yesterday when she developed fevers, chills, headache, and general malaise. There was some mild lower abdominal tenderness at that time, but the patient went to sleep, hoping that the symptoms would resolve overnight. Unfortunately, upon waking this morning, abdominal pain had worsened and she developed recurrent fevers and chills. She describes her pain as severe, localized to the lower abdomen near the midline, constant, sharp and cramping, associated with 3 loose stools this morning, but no melena or hematochezia She reports experiencing acute diverticulitis several years ago and describes a current symptoms as similar. Patient saw her primary care physician for evaluation of these complaints, was sent for CT of the abdomen and pelvis, noted to have a sigmoid diverticulitis with microperforation, and was directed to the ED for further evaluation.  Patient admitted with acute diverticulitis and microperforation. She was started on IV Zosyn. Patient was transition to oral antibiotics 5-12 however, continued to have abdominal pain. A CT scan was repeated 5-14 which showed worsening diverticulitis, and  developing abscess she was subsequently restarted on IV antibiotics on 5-14.   Plan is to repeat CT abdomen pelvis in 4 days, patient might required sx during this admission.   Subjective: Has had multiple BMs again today. Stools are watery again. Abdominal pain has not recurred.    Assessment & Plan:   Acute diverticulitis with microperforation;  - Repeated CT abdomen pelvis  5-14 showed worsening diverticulitis, possible developing abscess - Blood cultures negative - abdominal tenderness improving - Plan to repeat CT abdomen/ pelvis today - advanced to soft diet yesterday- still having diarrhea. Have started Imodium- follow for improvement - will need to continue IVF for now   Diarrhea -States that she's had diarrhea for a few days and at least 8 episodes of diarrhea on 5/16 -stool is watery-no associated nausea or vomiting-no blood noted in stool - C. difficile panel obtained >>  antigen +, toxin negative, PCR + - due to ongoing diarrhea, will need to start treatment for C diff with Vancomycin -Follow I and O closely-continue IV fluids to prevent dehydration  GERD - PPI.   Hypokalemia - resolved.   History of thyroid abnormalities.  Report night sweats.   Free T 3, T 4 and TSH ordered and slightly abnormal  I would recommend rechecking these as an outpatient as acute illness will predispose to sick euthyroid syndrome and result in a misdiagnosis TSH 0.240   Triiodothyronine,Free,Serum 3.0   T4,Free(Direct)  1.38    DVT prophylaxis:  lovenox,.  Code Status: full code.  Family Communication: care discussed with patient and husband at bedside  Disposition Plan: Continue to follow on MedSurg   Consultants:   Surgery    Procedures:  none   Antimicrobials:  Anti-infectives    Start     Dose/Rate Route Frequency Ordered Stop   07/14/16 1600  piperacillin-tazobactam (ZOSYN) IVPB 3.375 g     3.375 g 12.5 mL/hr over 240 Minutes Intravenous Every 8 hours 07/14/16 1423     07/14/16 1500  vancomycin (VANCOCIN) 50 mg/mL oral solution 125 mg  125 mg Oral 4 times daily 07/14/16 1404 07/28/16 1359   07/14/16 1415  ciprofloxacin (CIPRO) IVPB 400 mg  Status:  Discontinued     400 mg 200 mL/hr over 60 Minutes Intravenous Every 12 hours 07/14/16 1404 07/14/16 1423   07/11/16 1600  piperacillin-tazobactam (ZOSYN) IVPB 3.375 g  Status:  Discontinued      3.375 g 12.5 mL/hr over 240 Minutes Intravenous Every 8 hours 07/11/16 1524 07/14/16 1404   07/09/16 1230  amoxicillin-clavulanate (AUGMENTIN) 875-125 MG per tablet 1 tablet  Status:  Discontinued     1 tablet Oral Every 12 hours 07/09/16 1218 07/11/16 1454   07/06/16 0000  piperacillin-tazobactam (ZOSYN) IVPB 3.375 g  Status:  Discontinued     3.375 g 12.5 mL/hr over 240 Minutes Intravenous Every 8 hours 07/05/16 1843 07/09/16 1218   07/05/16 1545  piperacillin-tazobactam (ZOSYN) IVPB 3.375 g     3.375 g 100 mL/hr over 30 Minutes Intravenous  Once 07/05/16 1534 07/05/16 1638   07/05/16 1530  piperacillin-tazobactam (ZOSYN) IVPB 4.5 g  Status:  Discontinued     4.5 g 200 mL/hr over 30 Minutes Intravenous  Once 07/05/16 1523 07/05/16 1534        Objective: Vitals:   07/14/16 2056 07/15/16 0557 07/15/16 2026 07/16/16 0456  BP: (!) 144/57 136/74 125/62 133/61  Pulse: 72 72 76 67  Resp: 16 19 18 18   Temp: 98.4 F (36.9 C) 98.4 F (36.9 C) 98.7 F (37.1 C) 97.9 F (36.6 C)  TempSrc: Oral Oral Oral Oral  SpO2: 98% 98% 96% 98%  Weight:      Height:        Intake/Output Summary (Last 24 hours) at 07/16/16 1413 Last data filed at 07/16/16 0811  Gross per 24 hour  Intake             1695 ml  Output             2150 ml  Net             -455 ml   Filed Weights   07/05/16 1419 07/05/16 1815  Weight: 76.2 kg (168 lb) 80.1 kg (176 lb 9.4 oz)    Examination:  General exam: NAD Respiratory system: CTA Cardiovascular system: S 2, S 2 RRR Gastrointestinal system; Soft, mildly Tender in suprapubic area and left lower quadrant  Central nervous system: non focal.  Extremities: symmetric power Skin: No rashes, lesions or ulcers  Data Reviewed: I have personally reviewed following labs and imaging studies  CBC:  Recent Labs Lab 07/10/16 0635 07/11/16 0541 07/12/16 0622 07/14/16 0611  WBC 6.8 8.1 5.9 7.0  HGB 12.5 12.2 12.7 11.9*  HCT 36.9 36.9 39.0 35.9*  MCV 89.1 89.8  90.3 90.7  PLT 251 275 328 703   Basic Metabolic Panel:  Recent Labs Lab 07/11/16 1325 07/12/16 0719 07/13/16 1322 07/14/16 0611 07/16/16 0929  NA 137 139 141 141 141  K 4.2 3.9 4.0 3.8 3.9  CL 102 104 108 108 109  CO2 27 27 27 25 26   GLUCOSE 103* 103* 161* 106* 96  BUN 8 9 6  <5* 6  CREATININE 0.81 0.89 0.82 0.74 0.75  CALCIUM 9.8 9.5 9.3 9.3 9.2   GFR: Estimated Creatinine Clearance: 71.8 mL/min (by C-G formula based on SCr of 0.75 mg/dL). Liver Function Tests:  Recent Labs Lab 07/13/16 1322  AST 22  ALT 16  ALKPHOS 71  BILITOT 0.5  PROT 6.6  ALBUMIN 3.4*   No results for  input(s): LIPASE, AMYLASE in the last 168 hours. No results for input(s): AMMONIA in the last 168 hours. Coagulation Profile: No results for input(s): INR, PROTIME in the last 168 hours. Cardiac Enzymes: No results for input(s): CKTOTAL, CKMB, CKMBINDEX, TROPONINI in the last 168 hours. BNP (last 3 results) No results for input(s): PROBNP in the last 8760 hours. HbA1C: No results for input(s): HGBA1C in the last 72 hours. CBG: No results for input(s): GLUCAP in the last 168 hours. Lipid Profile: No results for input(s): CHOL, HDL, LDLCALC, TRIG, CHOLHDL, LDLDIRECT in the last 72 hours. Thyroid Function Tests:  Recent Labs  07/14/16 0611  TSH 0.240*   Anemia Panel: No results for input(s): VITAMINB12, FOLATE, FERRITIN, TIBC, IRON, RETICCTPCT in the last 72 hours. Sepsis Labs: No results for input(s): PROCALCITON, LATICACIDVEN in the last 168 hours.  Recent Results (from the past 240 hour(s))  Culture, blood (Routine X 2) w Reflex to ID Panel     Status: None (Preliminary result)   Collection Time: 07/12/16 11:12 AM  Result Value Ref Range Status   Specimen Description BLOOD RIGHT ANTECUBITAL  Final   Special Requests   Final    BOTTLES DRAWN AEROBIC ONLY Blood Culture adequate volume   Culture   Final    NO GROWTH 3 DAYS Performed at [facility], [address]  [place], [place] [address]    Report Status PENDING  Incomplete  Culture, blood (Routine X 2) w Reflex to ID Panel     Status: None (Preliminary result)   Collection Time: 07/12/16 11:44 AM  Result Value Ref Range Status   Specimen Description BLOOD RIGHT ARM  Final   Special Requests IN PEDIATRIC BOTTLE Blood Culture adequate volume  Final   Culture   Final    NO GROWTH 3 DAYS Performed at [facility], [place] [address] [place], [place] [address]    Report Status PENDING  Incomplete  C difficile quick scan w PCR reflex     Status: Abnormal   Collection Time: 07/14/16  8:10 AM  Result Value Ref Range Status   C Diff antigen POSITIVE (A) NEGATIVE Final   C Diff toxin NEGATIVE NEGATIVE Final   C Diff interpretation Results are indeterminate. See PCR results.  Final  Clostridium Difficile by PCR     Status: Abnormal   Collection Time: 07/14/16  8:10 AM  Result Value Ref Range Status   Toxigenic C Difficile by pcr POSITIVE (A) NEGATIVE Final    Comment: Positive for toxigenic C. difficile with little to no toxin production. Only treat if clinical presentation suggests symptomatic illness. Performed at [facility], [place] [address], [place], [place] [address]          Radiology Studies: Ct Abdomen Pelvis W Contrast  Result Date: 07/15/2016 CLINICAL DATA:  Fever, chills and abdominal pain. Followup diverticular abscess. EXAM: CT ABDOMEN AND PELVIS WITH CONTRAST TECHNIQUE: Multidetector CT imaging of the abdomen and pelvis was performed using the standard protocol following bolus administration of intravenous contrast. CONTRAST:  170mL ISOVUE-300 IOPAMIDOL (ISOVUE-300) INJECTION 61%, 57mL ISOVUE-300 IOPAMIDOL (ISOVUE-300) INJECTION 61% COMPARISON:  CT scan 07/11/2016 FINDINGS: Lower chest: The lung bases are clear of acute process. No pleural effusion or pulmonary lesions. The heart is normal in size. No pericardial effusion. The distal esophagus and aorta are unremarkable.  Hepatobiliary: Stable small scattered hepatic cysts and calcified granuloma in the left hepatic lobe. No worrisome hepatic lesions or intrahepatic biliary dilatation. The gallbladder surgically  absent. No common bile duct dilatation. Pancreas: No mass, inflammation or ductal dilatation. Spleen: Normal size.  No focal lesions. Adrenals/Urinary Tract: 2 cm left adrenal gland lesion most likely a benign adenoma. It was also present back in 2012. The right adrenal gland is normal. Both kidneys are normal. No renal, ureteral or bladder calculi or mass. Stomach/Bowel: The stomach, duodenum and small bowel are unremarkable. No inflammatory changes, mass lesions or obstructive findings. The terminal ileum is normal. There is a persistent 2 cm diverticular abscess between the sigmoid colon and un adjacent small bowel loop. This appears relatively stable when compared to the scan from 4 days ago. Could not exclude involvement of the wall of the adjacent small bowel loop. The inflammation of the sigmoid colon has improved. Vascular/Lymphatic: The aorta is normal in caliber. No dissection. The branch vessels are patent. The major venous structures are patent. No mesenteric or retroperitoneal mass or adenopathy. Small scattered lymph nodes are noted. Reproductive: The uterus and ovaries are unremarkable and stable. Other: No pelvic mass or adenopathy. No free pelvic fluid collections. No inguinal mass or adenopathy. No abdominal wall hernia or subcutaneous lesions. Musculoskeletal: No significant bony findings. IMPRESSION: 1. Persistence abscess located between the inflamed sigmoid colon and an adjacent small bowel loop. The inflammation of the sigmoid colon has improved. 2. Stable left adrenal gland adenoma. 3. Stable tiny scattered hepatic cysts. Electronically Signed   By: Marijo Sanes M.D.   On: 07/15/2016 12:38        Scheduled Meds: . calcium-vitamin D  1 tablet Oral BID  . enoxaparin (LOVENOX) injection  40 mg  Subcutaneous Q24H  . feeding supplement  1 Container Oral TID BM  . fluticasone  1 spray Each Nare Daily  . hydrocortisone cream   Topical BID  . loratadine  10 mg Oral Daily  . pantoprazole  40 mg Oral QHS  . saccharomyces boulardii  250 mg Oral BID  . vancomycin  125 mg Oral QID  . vitamin B-12  1,000 mcg Oral Daily   Continuous Infusions: . dextrose 5 % and 0.9 % NaCl with KCl 20 mEq/L 125 mL/hr at 07/16/16 1259  . piperacillin-tazobactam (ZOSYN)  IV Stopped (07/16/16 1212)     LOS: 10 days    Time spent: 25 minutes.     Debbe Odea, MD [facility] Pager [phone]  If 7PM-7AM, please contact night-coverage [email or web address] Password [email or web address] 07/16/2016, 2:13 PM

## 2016-07-17 LAB — CULTURE, BLOOD (ROUTINE X 2)
CULTURE: NO GROWTH
Culture: NO GROWTH
Special Requests: ADEQUATE
Special Requests: ADEQUATE

## 2016-07-17 MED ORDER — AMOXICILLIN-POT CLAVULANATE 875-125 MG PO TABS: 1.0000 | ORAL_TABLET | Freq: Two times a day (BID) | ORAL | 0 refills | Status: DC

## 2016-07-17 MED ORDER — BOOST / RESOURCE BREEZE PO LIQD: 1.0000 | Freq: Three times a day (TID) | ORAL | 0 refills | Status: DC

## 2016-07-17 MED ORDER — VANCOMYCIN HCL 125 MG PO CAPS: 125.0000 mg | ORAL_CAPSULE | Freq: Four times a day (QID) | ORAL | 0 refills | Status: DC

## 2016-07-17 MED ORDER — SACCHAROMYCES BOULARDII 250 MG PO CAPS: 250.0000 mg | ORAL_CAPSULE | Freq: Two times a day (BID) | ORAL | 0 refills | Status: DC

## 2016-07-17 MED ORDER — VANCOMYCIN 50 MG/ML ORAL SOLUTION: 125.0000 mg | Freq: Four times a day (QID) | ORAL | 0 refills | Status: DC

## 2016-07-17 MED ORDER — FAMOTIDINE 20 MG PO TABS: 20.0000 mg | ORAL_TABLET | Freq: Two times a day (BID) | ORAL | 0 refills | Status: DC

## 2016-07-17 NOTE — Care Management Note (Signed)
Case Management Note  Patient Details  Name: Tracy [name] MRN: [id number] Date of Birth: Aug 03, 1950  Subjective/Objective:     Perforation of sigmoid colon due to diverticulitis, c diff colitis               Action/Plan: Discharge Planning:  NCM spoke to pt and husband at bedside. Faxed Rx for Vancomycin capsules to [facility]. Her copay is $95 and she has a deductible of 165.00. Pt is ok with price. They do have in stock. Vancomycin oral solution is on back order and per pharmacist not available at any pharmacy.   PCP [name] MD  Expected Discharge Date:  07/17/16               Expected Discharge Plan:  Home/Self Care  In-House Referral:  NA  Discharge planning Services  CM Consult  Post Acute Care Choice:  NA Choice offered to:  NA  DME Arranged:  N/A DME Agency:  NA  HH Arranged:  NA HH Agency:  NA  Status of Service:  Completed, signed off  If discussed at [place] of Stay Meetings, dates discussed:    Additional Comments:  Erenest Rasher, RN 07/17/2016, 2:17 PM

## 2016-07-17 NOTE — Discharge Summary (Signed)
Physician Discharge Summary  Tracy [name] UGQ:[id number] DOB: October 04, 1950 DOA: 07/05/2016  PCP: [name], MD  Admit date: 07/05/2016 Discharge date: 07/17/2016  Admitted From: home Disposition:  home   Recommendations for Outpatient Follow-up:  1. F/u on diarrhea in 1-2 and continue appropriate treatment until resolution 2. F/u thyroid function in 3-4 weeks  Discharge Condition:  stable   CODE STATUS:  Full code   Consultations:  Gen sugery    Discharge Diagnoses:  Principal Problem:   Perforation of sigmoid colon due to diverticulitis Active Problems:   GERD   Acute diverticulitis   C. difficile colitis    Subjective: 1 watery stool today. No abdominal pain nausea or vomiting. No fever or chills.   HPI / brief summary Tracy [name] a 66 y.o.femalewith medical history significant for GERD and diverticulosis, presenting to the [facility] ED for evaluation of fevers, chills, headache, and abdominal pain which began the day prior. Patient reports that she had been in her usual state of fairly good health until yesterday when she developed fevers, chills, headache, and general malaise. There was some mild lower abdominal tenderness at that time, but the patient went to sleep, hoping that the symptoms would resolve overnight. Unfortunately, upon waking this morning, abdominal pain had worsened and she developed recurrent fevers and chills. She describes her pain as severe, localized to the lower abdomen near the midline, constant, sharp and cramping, associated with 3 loose stools this morning, but no melena or hematochezia She reports experiencing acute diverticulitis several years ago and describes a current symptoms as similar. Patient saw her primary care physician for evaluation of these complaints, was sent for CT of the abdomen and pelvis, noted to have a sigmoid diverticulitis with microperforation, and was directed to the ED for further evaluation.  Patient  admitted with acute diverticulitis and microperforation. She was started on IV Zosyn. Patient was transition to oral antibiotics 5-12 however, continued to have abdominal pain. A CT scan was repeated 5-14 which showed worsening diverticulitis, and  developing abscess she was subsequently restarted on IV antibiotics on 5-14.     Hospital Course:  Acute diverticulitis with microperforation;  - Repeated CT abdomen pelvis 5-14 showed worsening diverticulitis, possible developing abscess - Blood cultures negative - abdominal tenderness improving with resumption of Zosyn - 5/18 repeat CT abdomen/ pelvis reveals improvement in the inflammatory changes and persistent small abscess - advanced to soft diet which she is tolerating without pain - will continue Augmentin for 10 more days per Gen surgery recommendations- outpt f/u with Gen surgery as needed   Diarrhea- c diff colitis - at least 8 episodes of diarrhea on 5/16 -stool is watery-no associated nausea or vomiting-no blood noted in stool - C. difficile panel obtained >>  antigen +, toxin negative, PCR + - due to ongoing diarrhea, we bagan treatment for C diff with Vancomycin resulting in improvement but not yet complete resolution - she is having watery stool at least 1-2 times a day- hopefully with continued treatment, this should resolve- she should continue Vancomycin for 1 wk after discontinuing Augmentin - PPI discontinued in setting of active C diff infection  GERD - PPI transitioned to Pepcid- discussed with patient that she can resume her PPI if needed after C diff is treated  Hypokalemia - resolved.   History of thyroid abnormalities.  Report night sweats.   Free T 3, T 4 and TSH ordered and slightly abnormal  I would recommend rechecking these as an outpatient  as acute illness will predispose to sick euthyroid syndrome and result in a misdiagnosis TSH 0.240   Triiodothyronine,Free,Serum 3.0   T4,Free(Direct)  1.38      Discharge Instructions   Allergies as of 07/17/2016      Reactions   Demerol [meperidine] Nausea And Vomiting   Latex Rash, Other (See Comments)   Reaction:  Blisters       Medication List    STOP taking these medications   omeprazole 40 MG capsule Commonly known as:  PRILOSEC   psyllium 58.6 % powder Commonly known as:  METAMUCIL     TAKE these medications   amoxicillin-clavulanate 875-125 MG tablet Commonly known as:  AUGMENTIN Take 1 tablet by mouth 2 (two) times daily.   CALCIUM 600+D 600-400 MG-UNIT tablet Generic drug:  Calcium Carbonate-Vitamin D Take 2 tablets by mouth 2 (two) times daily.   cholecalciferol 1000 units tablet Commonly known as:  VITAMIN D Take 1,000 Units by mouth daily.   famotidine 20 MG tablet Commonly known as:  PEPCID Take 1 tablet (20 mg total) by mouth 2 (two) times daily.   feeding supplement Liqd Take 1 Container by mouth 3 (three) times daily between meals.   fenofibrate 160 MG tablet Take 1 tablet (160 mg total) by mouth daily.   Melatonin 5 MG Tabs Take 5 mg by mouth at bedtime as needed (for sleep).   metroNIDAZOLE 0.75 % gel Commonly known as:  METROGEL Apply 1 application topically daily as needed (for rosacea).   saccharomyces boulardii 250 MG capsule Commonly known as:  FLORASTOR Take 1 capsule (250 mg total) by mouth 2 (two) times daily.   vancomycin 125 MG capsule Commonly known as:  VANCOCIN Take 1 capsule (125 mg total) by mouth 4 (four) times daily.   vitamin B-12 1000 MCG tablet Commonly known as:  CYANOCOBALAMIN Take 1,000 mcg by mouth daily.       Allergies  Allergen Reactions  . Demerol [Meperidine] Nausea And Vomiting  . Latex Rash and Other (See Comments)    Reaction:  Blisters      Procedures/Studies:    Ct Abdomen Pelvis W Contrast  Result Date: 07/15/2016 CLINICAL DATA:  Fever, chills and abdominal pain. Followup diverticular abscess. EXAM: CT ABDOMEN AND PELVIS WITH CONTRAST  TECHNIQUE: Multidetector CT imaging of the abdomen and pelvis was performed using the standard protocol following bolus administration of intravenous contrast. CONTRAST:  160mL ISOVUE-300 IOPAMIDOL (ISOVUE-300) INJECTION 61%, 67mL ISOVUE-300 IOPAMIDOL (ISOVUE-300) INJECTION 61% COMPARISON:  CT scan 07/11/2016 FINDINGS: Lower chest: The lung bases are clear of acute process. No pleural effusion or pulmonary lesions. The heart is normal in size. No pericardial effusion. The distal esophagus and aorta are unremarkable. Hepatobiliary: Stable small scattered hepatic cysts and calcified granuloma in the left hepatic lobe. No worrisome hepatic lesions or intrahepatic biliary dilatation. The gallbladder surgically absent. No common bile duct dilatation. Pancreas: No mass, inflammation or ductal dilatation. Spleen: Normal size.  No focal lesions. Adrenals/Urinary Tract: 2 cm left adrenal gland lesion most likely a benign adenoma. It was also present back in 2012. The right adrenal gland is normal. Both kidneys are normal. No renal, ureteral or bladder calculi or mass. Stomach/Bowel: The stomach, duodenum and small bowel are unremarkable. No inflammatory changes, mass lesions or obstructive findings. The terminal ileum is normal. There is a persistent 2 cm diverticular abscess between the sigmoid colon and un adjacent small bowel loop. This appears relatively stable when compared to the scan from 4 days ago.  Could not exclude involvement of the wall of the adjacent small bowel loop. The inflammation of the sigmoid colon has improved. Vascular/Lymphatic: The aorta is normal in caliber. No dissection. The branch vessels are patent. The major venous structures are patent. No mesenteric or retroperitoneal mass or adenopathy. Small scattered lymph nodes are noted. Reproductive: The uterus and ovaries are unremarkable and stable. Other: No pelvic mass or adenopathy. No free pelvic fluid collections. No inguinal mass or adenopathy.  No abdominal wall hernia or subcutaneous lesions. Musculoskeletal: No significant bony findings. IMPRESSION: 1. Persistence abscess located between the inflamed sigmoid colon and an adjacent small bowel loop. The inflammation of the sigmoid colon has improved. 2. Stable left adrenal gland adenoma. 3. Stable tiny scattered hepatic cysts. Electronically Signed   By: Marijo Sanes M.D.   On: 07/15/2016 12:38   Ct Abdomen Pelvis W Contrast  Result Date: 07/11/2016 CLINICAL DATA:  History of GERD and diverticulosis. Fever, chills, headache, abdominal pain started 1 day prior. Mid abdominal tenderness. History of perforated diverticulitis with similar symptoms. EXAM: CT ABDOMEN AND PELVIS WITH CONTRAST TECHNIQUE: Multidetector CT imaging of the abdomen and pelvis was performed using the standard protocol following bolus administration of intravenous contrast. CONTRAST:  100 ISOVUE-300 IOPAMIDOL (ISOVUE-300) INJECTION 61% COMPARISON:  07/05/2016 FINDINGS: Lower chest: No acute abnormality. Hepatobiliary: Status post cholecystectomy. The liver is homogeneous. Small probable hemangioma is identified within the anterior segment of the right hepatic lobe measuring 0.8 x 0.7 cm on image 30 of series 2. No suspicious liver lesions are identified. Pancreas: Unremarkable. No pancreatic ductal dilatation or surrounding inflammatory changes. Spleen: Normal in size without focal abnormality. Adrenals/Urinary Tract: Left adrenal nodule is stable in appearance, 2.1 x 2.6 cm. Right adrenal gland is normal in appearance. Kidneys are symmetric in enhancement and excretion. No renal mass. Stomach/Bowel: Small hiatal hernia. The stomach and small bowel loops are normal in appearance. Status post appendectomy. There is extensive moderate inflammatory change surrounding the sigmoid colon, progressed since the prior study. Focal wall thickening of sigmoid colon measures up to 1.9 cm best seen on image 63 of series 2. A small air/ fluid  collection is identified adjacent to an adjacent small bowel loop in is new since the prior study. This measures 2.0 x 2.3 cm. Early abscess should be considered. Vascular/Lymphatic: No significant adenopathy. Reproductive: Uterus is present.  No suspicious adnexal mass. Other: Trace free pelvic fluid.  No abdominal wall hernia. Musculoskeletal: Degenerative changes are seen in the spine and both hips. IMPRESSION: 1. Further progression of sigmoid diverticulitis. Small interloop air/fluid collection consistent with microperforation and possible early abscess. 2. No free intraperitoneal air. 3. Status post cholecystectomy and appendectomy. 4. Small hemangioma within the right hepatic lobe. 5. Stable left adrenal nodule consistent with benign lesion. 6. Small hiatal hernia. These results will be called to the ordering clinician or representative by the Radiologist Assistant, and communication documented in the PACS or zVision Dashboard. Electronically Signed   By: Nolon Nations M.D.   On: 07/11/2016 14:43   Ct Abdomen Pelvis W Contrast  Result Date: 07/05/2016 CLINICAL DATA:  Diffuse low abdominal pain for 1 day with fever. History of diverticulitis and ischemic colitis. EXAM: CT ABDOMEN AND PELVIS WITH CONTRAST TECHNIQUE: Multidetector CT imaging of the abdomen and pelvis was performed using the standard protocol following bolus administration of intravenous contrast. CONTRAST:  114mL ISOVUE-300 IOPAMIDOL (ISOVUE-300) INJECTION 61% COMPARISON:  CT 09/03/2010 and 09/26/2007. FINDINGS: Lower chest: Clear lung bases. No significant pleural or pericardial effusion.  Hepatobiliary: Scattered small, low-density hepatic lesions are similar to the previous study. No worrisome hepatic findings. No significant biliary dilatation post cholecystectomy. Pancreas: Unremarkable. No pancreatic ductal dilatation or surrounding inflammatory changes. Spleen: Normal in size without focal abnormality. Adrenals/Urinary Tract: Left  adrenal nodule measuring 2.4 x 2.0 cm (image 21) has mildly enlarged compared with the prior study at which time it measured 2.0 x 1.6 cm. The right adrenal gland appears normal. The kidneys appear normal without evidence of urinary tract calculus, suspicious lesion or hydronephrosis. No bladder abnormalities are seen. Stomach/Bowel: Small hiatal hernia. The stomach, small bowel and proximal colon appear normal. There are diverticular changes of the descending and sigmoid colon. There is sigmoid colon wall thickening and surrounding inflammation. In addition, there is a small amount of surrounding extraluminal fluid and air (images 64-71). No drainable fluid collection or free air. No evidence of bowel obstruction. Vascular/Lymphatic: There are no enlarged abdominal or pelvic lymph nodes. Stable minimal aortic and branch vessel atherosclerosis. Reproductive: The uterus and adnexa appear unremarkable. Other: No evidence of abdominal wall mass or hernia. No ascites. Musculoskeletal: No acute or significant osseous findings. There is multilevel lumbar spondylosis. There is asymmetric left hip arthropathy. IMPRESSION: 1. Acute sigmoid diverticulitis with small contained micro perforation manifesting as adjacent extraluminal air and fluid. No drainable fluid collection or free air. 2. Although a left adrenal nodule has mildly enlarged from 2012, this has been present since at least 2009 and is still likely an adenoma. 3. Stable small low-density hepatic lesions. 4. These results will be called to the ordering clinician or representative by the Radiologist Assistant, and communication documented in the PACS or zVision Dashboard. Electronically Signed   By: Richardean Sale M.D.   On: 07/05/2016 13:46       Discharge Exam: Vitals:   07/16/16 2021 07/17/16 0446  BP: (!) 129/59 140/63  Pulse: 71 73  Resp: 18 16  Temp: 98.2 F (36.8 C) 97.9 F (36.6 C)   Vitals:   07/16/16 0456 07/16/16 1400 07/16/16 2021  07/17/16 0446  BP: 133/61 (!) 116/59 (!) 129/59 140/63  Pulse: 67 78 71 73  Resp: 18 18 18 16   Temp: 97.9 F (36.6 C) 98.2 F (36.8 C) 98.2 F (36.8 C) 97.9 F (36.6 C)  TempSrc: Oral Oral Oral Oral  SpO2: 98% 96% 98% 96%  Weight:      Height:        General: Pt is alert, awake, not in acute distress Cardiovascular: RRR, S1/S2 +, no rubs, no gallops Respiratory: CTA bilaterally, no wheezing, no rhonchi Abdominal: Soft, NT, ND, bowel sounds + Extremities: no edema, no cyanosis    The results of significant diagnostics from this hospitalization (including imaging, microbiology, ancillary and laboratory) are listed below for reference.     Microbiology: Recent Results (from the past 240 hour(s))  Culture, blood (Routine X 2) w Reflex to ID Panel     Status: None (Preliminary result)   Collection Time: 07/12/16 11:12 AM  Result Value Ref Range Status   Specimen Description BLOOD RIGHT ANTECUBITAL  Final   Special Requests   Final    BOTTLES DRAWN AEROBIC ONLY Blood Culture adequate volume   Culture   Final    NO GROWTH 4 DAYS Performed at [facility], [address], [place], [place] [address]    Report Status PENDING  Incomplete  Culture, blood (Routine X 2) w Reflex to ID Panel     Status: None (Preliminary result)  Collection Time: 07/12/16 11:44 AM  Result Value Ref Range Status   Specimen Description BLOOD RIGHT ARM  Final   Special Requests IN PEDIATRIC BOTTLE Blood Culture adequate volume  Final   Culture   Final    NO GROWTH 4 DAYS Performed at [facility], [place] [address] [place], [place] [address]    Report Status PENDING  Incomplete  C difficile quick scan w PCR reflex     Status: Abnormal   Collection Time: 07/14/16  8:10 AM  Result Value Ref Range Status   C Diff antigen POSITIVE (A) NEGATIVE Final   C Diff toxin NEGATIVE NEGATIVE Final   C Diff interpretation Results are indeterminate. See PCR results.  Final  Clostridium Difficile by  PCR     Status: Abnormal   Collection Time: 07/14/16  8:10 AM  Result Value Ref Range Status   Toxigenic C Difficile by pcr POSITIVE (A) NEGATIVE Final    Comment: Positive for toxigenic C. difficile with little to no toxin production. Only treat if clinical presentation suggests symptomatic illness. Performed at [facility], [place] [address], [place], [place] [address]      Labs: BNP (last 3 results) No results for input(s): BNP in the last 8760 hours. Basic Metabolic Panel:  Recent Labs Lab 07/11/16 1325 07/12/16 0719 07/13/16 1322 07/14/16 0611 07/16/16 0929  NA 137 139 141 141 141  K 4.2 3.9 4.0 3.8 3.9  CL 102 104 108 108 109  CO2 27 27 27 25 26   GLUCOSE 103* 103* 161* 106* 96  BUN 8 9 6  <5* 6  CREATININE 0.81 0.89 0.82 0.74 0.75  CALCIUM 9.8 9.5 9.3 9.3 9.2   Liver Function Tests:  Recent Labs Lab 07/13/16 1322  AST 22  ALT 16  ALKPHOS 71  BILITOT 0.5  PROT 6.6  ALBUMIN 3.4*   No results for input(s): LIPASE, AMYLASE in the last 168 hours. No results for input(s): AMMONIA in the last 168 hours. CBC:  Recent Labs Lab 07/11/16 0541 07/12/16 0622 07/14/16 0611  WBC 8.1 5.9 7.0  HGB 12.2 12.7 11.9*  HCT 36.9 39.0 35.9*  MCV 89.8 90.3 90.7  PLT 275 328 361   Cardiac Enzymes: No results for input(s): CKTOTAL, CKMB, CKMBINDEX, TROPONINI in the last 168 hours. BNP: Invalid input(s): POCBNP CBG: No results for input(s): GLUCAP in the last 168 hours. D-Dimer No results for input(s): DDIMER in the last 72 hours. Hgb A1c No results for input(s): HGBA1C in the last 72 hours. Lipid Profile No results for input(s): CHOL, HDL, LDLCALC, TRIG, CHOLHDL, LDLDIRECT in the last 72 hours. Thyroid function studies No results for input(s): TSH, T4TOTAL, T3FREE, THYROIDAB in the last 72 hours.  Invalid input(s): FREET3 Anemia work up No results for input(s): VITAMINB12, FOLATE, FERRITIN, TIBC, IRON, RETICCTPCT in the last 72 hours. Urinalysis    Component  Value Date/Time   COLORURINE YELLOW 07/06/2016 [place] 07/06/2016 1221   LABSPEC 1.024 07/06/2016 1221   PHURINE 5.0 07/06/2016 1221   GLUCOSEU NEGATIVE 07/06/2016 1221   GLUCOSEU NEGATIVE 12/09/2014 0949   HGBUR NEGATIVE 07/06/2016 1221   BILIRUBINUR NEGATIVE 07/06/2016 1221   BILIRUBINUR negative 03/21/2016 1519   BILIRUBINUR Negative 11/27/2012 1516   KETONESUR 80 (A) 07/06/2016 1221   PROTEINUR NEGATIVE 07/06/2016 1221   UROBILINOGEN 0.2 03/21/2016 1519   UROBILINOGEN 0.2 12/09/2014 0949   NITRITE NEGATIVE 07/06/2016 1221   LEUKOCYTESUR NEGATIVE 07/06/2016 1221   Sepsis Labs Invalid input(s):  PROCALCITONIN,  WBC,  LACTICIDVEN Microbiology Recent Results (from the past 240 hour(s))  Culture, blood (Routine X 2) w Reflex to ID Panel     Status: None (Preliminary result)   Collection Time: 07/12/16 11:12 AM  Result Value Ref Range Status   Specimen Description BLOOD RIGHT ANTECUBITAL  Final   Special Requests   Final    BOTTLES DRAWN AEROBIC ONLY Blood Culture adequate volume   Culture   Final    NO GROWTH 4 DAYS Performed at [facility], [address], [place], [place] [address]    Report Status PENDING  Incomplete  Culture, blood (Routine X 2) w Reflex to ID Panel     Status: None (Preliminary result)   Collection Time: 07/12/16 11:44 AM  Result Value Ref Range Status   Specimen Description BLOOD RIGHT ARM  Final   Special Requests IN PEDIATRIC BOTTLE Blood Culture adequate volume  Final   Culture   Final    NO GROWTH 4 DAYS Performed at [facility], [place] [address], [place], [place] [address]    Report Status PENDING  Incomplete  C difficile quick scan w PCR reflex     Status: Abnormal   Collection Time: 07/14/16  8:10 AM  Result Value Ref Range Status   C Diff antigen POSITIVE (A) NEGATIVE Final   C Diff toxin NEGATIVE NEGATIVE Final   C Diff interpretation Results are indeterminate. See PCR results.  Final  Clostridium Difficile  by PCR     Status: Abnormal   Collection Time: 07/14/16  8:10 AM  Result Value Ref Range Status   Toxigenic C Difficile by pcr POSITIVE (A) NEGATIVE Final    Comment: Positive for toxigenic C. difficile with little to no toxin production. Only treat if clinical presentation suggests symptomatic illness. Performed at [facility], [place] [address] [place],  [address]      Time coordinating discharge: Over 30 minutes  SIGNED:   Debbe Odea, MD  [facility] 07/17/2016, 10:55 AM Pager   If 7PM-7AM, please contact night-coverage [email or web address] Password [email or web address]

## 2016-07-18 ENCOUNTER — Telehealth: Payer: Self-pay

## 2016-07-18 NOTE — Telephone Encounter (Signed)
Transition Care Management Follow-up Telephone Call   Date discharged? 07/17/2016   How have you been since you were released from the hospital? "still puny"   Do you understand why you were in the hospital? yes   Do you understand the discharge instructions? yes   Where were you discharged to? Home, lives with husband.    Items Reviewed:  Medications reviewed: yes  Allergies reviewed: yes  Dietary changes reviewed: yes, soft diet x 3 days. Increasing fiber as tolerated.   Referrals reviewed: no   Functional Questionnaire:   Activities of Daily Living (ADLs):   She states they are independent in the following: ambulation, bathing and hygiene, feeding, continence, grooming, toileting and dressing States they require assistance with the following: None   Any transportation issues/concerns?: no   Any patient concerns? no   Confirmed importance and date/time of follow-up visits scheduled yes  Provider Appointment booked with PCP on Wednesday, 07/27/2016 @ 11am.   Confirmed with patient if condition begins to worsen call PCP or go to the ER.  Patient was given the office number and encouraged to call back with question or concerns.  : yes

## 2016-07-20 ENCOUNTER — Encounter: Payer: Self-pay | Admitting: Family Medicine

## 2016-07-27 ENCOUNTER — Encounter: Payer: Self-pay | Admitting: Family Medicine

## 2016-07-27 ENCOUNTER — Ambulatory Visit (INDEPENDENT_AMBULATORY_CARE_PROVIDER_SITE_OTHER): Payer: Medicare Other | Admitting: Family Medicine

## 2016-07-27 VITALS — BP 116/76 | HR 77 | Temp 98.1°F | Resp 17 | Ht 66.0 in | Wt 161.0 lb

## 2016-07-27 DIAGNOSIS — E0590 Thyrotoxicosis, unspecified without thyrotoxic crisis or storm: Secondary | ICD-10-CM

## 2016-07-27 DIAGNOSIS — K5720 Diverticulitis of large intestine with perforation and abscess without bleeding: Secondary | ICD-10-CM

## 2016-07-27 DIAGNOSIS — A0472 Enterocolitis due to Clostridium difficile, not specified as recurrent: Secondary | ICD-10-CM | POA: Diagnosis not present

## 2016-07-27 LAB — BASIC METABOLIC PANEL
BUN: 18 mg/dL (ref 6–23)
CALCIUM: 10.4 mg/dL (ref 8.4–10.5)
CO2: 30 mEq/L (ref 19–32)
CREATININE: 0.86 mg/dL (ref 0.40–1.20)
Chloride: 103 mEq/L (ref 96–112)
GFR: 69.68 mL/min (ref >60.00)
Glucose, Bld: 96 mg/dL (ref 70–99)
Potassium: 4.2 mEq/L (ref 3.5–5.1)
Sodium: 141 mEq/L (ref 135–145)

## 2016-07-27 LAB — CBC WITH DIFFERENTIAL/PLATELET
BASOS ABS: 0 10*3/uL (ref 0.0–0.1)
Basophils Relative: 0.8 % (ref 0.0–3.0)
EOS ABS: 0.1 10*3/uL (ref 0.0–0.7)
Eosinophils Relative: 2 % (ref 0.0–5.0)
HEMATOCRIT: 40.9 % (ref 36.0–46.0)
HEMOGLOBIN: 13.9 g/dL (ref 12.0–15.0)
LYMPHS PCT: 22.2 % (ref 12.0–46.0)
Lymphs Abs: 1.3 10*3/uL (ref 0.7–4.0)
MCHC: 33.9 g/dL (ref 30.0–36.0)
MCV: 89.5 fl (ref 78.0–100.0)
Monocytes Absolute: 0.5 10*3/uL (ref 0.1–1.0)
Monocytes Relative: 9.1 % (ref 3.0–12.0)
NEUTROS ABS: 3.8 10*3/uL (ref 1.4–7.7)
Neutrophils Relative %: 65.9 % (ref 43.0–77.0)
PLATELETS: 285 10*3/uL (ref 150.0–400.0)
RBC: 4.57 Mil/uL (ref 3.87–5.11)
RDW: 13.5 % (ref 11.5–15.5)
WBC: 5.8 10*3/uL (ref 4.0–10.5)

## 2016-07-27 LAB — T3, FREE: T3, Free: 3.8 pg/mL (ref 2.3–4.2)

## 2016-07-27 LAB — TSH: TSH: 0.02 u[IU]/mL — AB (ref 0.35–4.50)

## 2016-07-27 LAB — T4, FREE: FREE T4: 1.46 ng/dL (ref 0.60–1.60)

## 2016-07-27 NOTE — Assessment & Plan Note (Signed)
Pt has hx of this.  Recent hospital labs were again abnormal.  Repeat today and forward to Dr Cruzita Lederer for review if needed.

## 2016-07-27 NOTE — Patient Instructions (Addendum)
Follow up as needed/scheduled We'll notify you of your lab results and make any changes if needed Ease back into your diet- add things as you feel ready You will get your energy back but we have to be realistic as to how long this will take!  You were hospitalized for 12 days and my rule of thumb is 2-3 days recovery for every day in the hospital- so it could take up to a month!!!  And that's OK!!!  You've been through so much! Drink plenty of fluids REST!!!  You've earned it!! Call with any questions or concerns Hang in there!!!  You're doing great!

## 2016-07-27 NOTE — Assessment & Plan Note (Signed)
New.  Pt has 7 more days of Vanc.  Stools are firming and frequency is decreasing.  Reviewed supportive care and red flags that should prompt return.  Pt expressed understanding and is in agreement w/ plan.

## 2016-07-27 NOTE — Assessment & Plan Note (Signed)
Cause of recent hospitalization.  Pt is doing much better.  Attempting to advance diet.  Completed her Augmentin this AM.  Reviewed supportive care and red flags that should prompt return.  Pt expressed understanding and is in agreement w/ plan.

## 2016-07-27 NOTE — Progress Notes (Signed)
Pre visit review using our clinic review tool, if applicable. No additional management support is needed unless otherwise documented below in the visit note. 

## 2016-07-27 NOTE — Progress Notes (Signed)
   Subjective:    Patient ID: Tracy [name], female    DOB: 1951/02/26, 66 y.o.   MRN: [id number]  [facility] f/u- pt was admitted 5/8-20 for diverticulitis w/ microperforation.  She was started on IV Zosyn and then transitioned to oral abx.  On 5/14 CT showed abscess formation.  Restarted on IV Zosyn.  She was also dx'd w/ C Diff during her hospitalization and started on oral Vanc.  She is to take the Vanc for another 7 days after completion of her 10 days of Augmentin (which she finished today).  During hospitalization she had abnormal thyroid studies which need to be repeated.  D/C summary, imaging, and labs reviewed.  At this time is only having AM BMs- stools are 'much more solid, no diarrhea at all'.  Pt reports her meals are 'very small' due to increased bloating and GERD.  No blood in stool, no vomiting.   Review of Systems For ROS see HPI     Objective:   Physical Exam  Constitutional: She is oriented to person, place, and time. She appears well-developed and well-nourished. No distress.  HENT:  Head: Normocephalic and atraumatic.  Eyes: Conjunctivae and EOM are normal. Pupils are equal, round, and reactive to light.  Neck: Normal range of motion. Neck supple. No thyromegaly present.  Cardiovascular: Normal rate, regular rhythm, normal heart sounds and intact distal pulses.   No murmur heard. Pulmonary/Chest: Effort normal and breath sounds normal. No respiratory distress.  Abdominal: Soft. She exhibits no distension. There is no tenderness.  Musculoskeletal: She exhibits no edema.  Lymphadenopathy:    She has no cervical adenopathy.  Neurological: She is alert and oriented to person, place, and time.  Skin: Skin is warm and dry.  Psychiatric: She has a normal mood and affect. Her behavior is normal.          Assessment & Plan:

## 2016-07-28 ENCOUNTER — Encounter: Payer: Self-pay | Admitting: Family Medicine

## 2016-07-28 ENCOUNTER — Telehealth: Payer: Self-pay

## 2016-07-28 ENCOUNTER — Encounter: Payer: Self-pay | Admitting: Internal Medicine

## 2016-07-28 DIAGNOSIS — K5792 Diverticulitis of intestine, part unspecified, without perforation or abscess without bleeding: Secondary | ICD-10-CM

## 2016-07-28 NOTE — Telephone Encounter (Signed)
-----   Message from [name], MD sent at 07/27/2016  7:18 PM EDT ----- Almyra Free, I got her TFTs >> I am not sure why her TSH is lower... Possibly 2/2 recent ABx use. Let's schedule an appt in 10/2016 and I will recheck the tests then.

## 2016-07-28 NOTE — Telephone Encounter (Signed)
Called patient and gave lab results. Patient had no questions or concerns. Patient will call back to make appointment.

## 2016-08-03 ENCOUNTER — Encounter: Payer: Self-pay | Admitting: Family Medicine

## 2016-08-08 ENCOUNTER — Emergency Department (HOSPITAL_COMMUNITY)
Admission: EM | Admit: 2016-08-08 | Discharge: 2016-08-08 | Disposition: A | Discharge status: Home / Self Care | Payer: Medicare Other | Source: Home / Self Care | Attending: Emergency Medicine | Admitting: Emergency Medicine

## 2016-08-08 ENCOUNTER — Emergency Department (HOSPITAL_COMMUNITY): Payer: Medicare Other

## 2016-08-08 ENCOUNTER — Telehealth: Payer: Self-pay | Admitting: Family Medicine

## 2016-08-08 ENCOUNTER — Encounter: Payer: Self-pay | Admitting: Internal Medicine

## 2016-08-08 ENCOUNTER — Encounter (HOSPITAL_COMMUNITY): Payer: Self-pay

## 2016-08-08 DIAGNOSIS — K5792 Diverticulitis of intestine, part unspecified, without perforation or abscess without bleeding: Secondary | ICD-10-CM

## 2016-08-08 DIAGNOSIS — M1990 Unspecified osteoarthritis, unspecified site: Secondary | ICD-10-CM | POA: Diagnosis not present

## 2016-08-08 DIAGNOSIS — Z8619 Personal history of other infectious and parasitic diseases: Secondary | ICD-10-CM | POA: Diagnosis not present

## 2016-08-08 DIAGNOSIS — M8580 Other specified disorders of bone density and structure, unspecified site: Secondary | ICD-10-CM | POA: Diagnosis not present

## 2016-08-08 DIAGNOSIS — Z79899 Other long term (current) drug therapy: Secondary | ICD-10-CM | POA: Insufficient documentation

## 2016-08-08 DIAGNOSIS — K5900 Constipation, unspecified: Secondary | ICD-10-CM | POA: Diagnosis not present

## 2016-08-08 DIAGNOSIS — Z85828 Personal history of other malignant neoplasm of skin: Secondary | ICD-10-CM

## 2016-08-08 DIAGNOSIS — Z91040 Latex allergy status: Secondary | ICD-10-CM | POA: Insufficient documentation

## 2016-08-08 DIAGNOSIS — R109 Unspecified abdominal pain: Secondary | ICD-10-CM | POA: Diagnosis not present

## 2016-08-08 DIAGNOSIS — R1032 Left lower quadrant pain: Secondary | ICD-10-CM | POA: Diagnosis not present

## 2016-08-08 DIAGNOSIS — Z96651 Presence of right artificial knee joint: Secondary | ICD-10-CM | POA: Diagnosis not present

## 2016-08-08 DIAGNOSIS — E440 Moderate protein-calorie malnutrition: Secondary | ICD-10-CM | POA: Diagnosis not present

## 2016-08-08 DIAGNOSIS — R197 Diarrhea, unspecified: Secondary | ICD-10-CM | POA: Diagnosis not present

## 2016-08-08 DIAGNOSIS — Z885 Allergy status to narcotic agent status: Secondary | ICD-10-CM | POA: Diagnosis not present

## 2016-08-08 DIAGNOSIS — E781 Pure hyperglyceridemia: Secondary | ICD-10-CM | POA: Diagnosis not present

## 2016-08-08 DIAGNOSIS — E042 Nontoxic multinodular goiter: Secondary | ICD-10-CM | POA: Diagnosis not present

## 2016-08-08 DIAGNOSIS — K5732 Diverticulitis of large intestine without perforation or abscess without bleeding: Secondary | ICD-10-CM | POA: Diagnosis not present

## 2016-08-08 DIAGNOSIS — K5730 Diverticulosis of large intestine without perforation or abscess without bleeding: Secondary | ICD-10-CM | POA: Diagnosis not present

## 2016-08-08 DIAGNOSIS — L719 Rosacea, unspecified: Secondary | ICD-10-CM | POA: Diagnosis not present

## 2016-08-08 DIAGNOSIS — E0590 Thyrotoxicosis, unspecified without thyrotoxic crisis or storm: Secondary | ICD-10-CM | POA: Diagnosis not present

## 2016-08-08 DIAGNOSIS — Z9049 Acquired absence of other specified parts of digestive tract: Secondary | ICD-10-CM | POA: Diagnosis not present

## 2016-08-08 DIAGNOSIS — K219 Gastro-esophageal reflux disease without esophagitis: Secondary | ICD-10-CM | POA: Diagnosis not present

## 2016-08-08 HISTORY — DX: Enterocolitis due to Clostridium difficile, not specified as recurrent: A04.72

## 2016-08-08 LAB — COMPREHENSIVE METABOLIC PANEL
ALT: 24 U/L (ref 14–54)
AST: 32 U/L (ref 15–41)
Albumin: 4.6 g/dL (ref 3.5–5.0)
Alkaline Phosphatase: 69 U/L (ref 38–126)
Anion gap: 8 (ref 5–15)
BUN: 23 mg/dL — ABNORMAL HIGH (ref 6–20)
CO2: 27 mmol/L (ref 22–32)
CREATININE: 1 mg/dL (ref 0.44–1.00)
Calcium: 10.4 mg/dL — ABNORMAL HIGH (ref 8.9–10.3)
Chloride: 106 mmol/L (ref 101–111)
GFR, EST NON AFRICAN AMERICAN: 57 mL/min — AB (ref >60)
Glucose, Bld: 139 mg/dL — ABNORMAL HIGH (ref 65–99)
POTASSIUM: 4 mmol/L (ref 3.5–5.1)
Sodium: 141 mmol/L (ref 135–145)
TOTAL PROTEIN: 7.8 g/dL (ref 6.5–8.1)
Total Bilirubin: 1 mg/dL (ref 0.3–1.2)

## 2016-08-08 LAB — URINALYSIS, ROUTINE W REFLEX MICROSCOPIC
BILIRUBIN URINE: NEGATIVE
Bacteria, UA: NONE SEEN
Glucose, UA: NEGATIVE mg/dL
Hgb urine dipstick: NEGATIVE
Ketones, ur: NEGATIVE mg/dL
Leukocytes, UA: NEGATIVE
Nitrite: NEGATIVE
PH: 6 (ref 5.0–8.0)
Protein, ur: NEGATIVE mg/dL

## 2016-08-08 LAB — CBC
HCT: 41 % (ref 36.0–46.0)
Hemoglobin: 13.9 g/dL (ref 12.0–15.0)
MCH: 30.1 pg (ref 26.0–34.0)
MCHC: 33.9 g/dL (ref 30.0–36.0)
MCV: 88.7 fL (ref 78.0–100.0)
PLATELETS: 190 10*3/uL (ref 150–400)
RBC: 4.62 MIL/uL (ref 3.87–5.11)
RDW: 13.3 % (ref 11.5–15.5)
WBC: 15 10*3/uL — AB (ref 4.0–10.5)

## 2016-08-08 LAB — TYPE AND SCREEN
ABO/RH(D): A POS
ANTIBODY SCREEN: NEGATIVE

## 2016-08-08 LAB — CBG MONITORING, ED: GLUCOSE-CAPILLARY: 128 mg/dL — AB (ref 65–99)

## 2016-08-08 MED ORDER — MORPHINE SULFATE (PF) 2 MG/ML IV SOLN
4.0000 mg | Freq: Once | INTRAVENOUS | Status: AC
  Administered 2016-08-08: 4 mg via INTRAVENOUS
  Filled 2016-08-08: qty 2

## 2016-08-08 MED ORDER — SODIUM CHLORIDE 0.9 % IV BOLUS (SEPSIS)
1000.0000 mL | Freq: Once | INTRAVENOUS | Status: AC
  Administered 2016-08-08: 1000 mL via INTRAVENOUS

## 2016-08-08 MED ORDER — OXYCODONE-ACETAMINOPHEN 5-325 MG PO TABS: 1.0000 | ORAL_TABLET | Freq: Four times a day (QID) | ORAL | 0 refills | Status: DC | PRN

## 2016-08-08 MED ORDER — IOPAMIDOL (ISOVUE-300) INJECTION 61%
INTRAVENOUS | Status: AC
  Filled 2016-08-08: qty 100

## 2016-08-08 MED ORDER — IOPAMIDOL (ISOVUE-300) INJECTION 61%
100.0000 mL | Freq: Once | INTRAVENOUS | Status: AC | PRN
  Administered 2016-08-08: 100 mL via INTRAVENOUS

## 2016-08-08 MED ORDER — AMOXICILLIN-POT CLAVULANATE 500-125 MG PO TABS: 1.0000 | ORAL_TABLET | Freq: Three times a day (TID) | ORAL | 0 refills | Status: DC

## 2016-08-08 MED ORDER — ONDANSETRON HCL 4 MG/2ML IJ SOLN
4.0000 mg | Freq: Once | INTRAMUSCULAR | Status: AC
  Administered 2016-08-08: 4 mg via INTRAVENOUS
  Filled 2016-08-08: qty 2

## 2016-08-08 NOTE — ED Triage Notes (Addendum)
Patient was recently hospitalized and had c diff. Patient states she began having diarrhea again today. Patient states she also has generalized abdominal pain. Patient states she also had blood in her diarrhea each time today.

## 2016-08-08 NOTE — Telephone Encounter (Signed)
Patient Name: Tracy [name] DOB: 09-13-1950 Initial Comment Caller has been treated for diverticulitis and Cdiff . Finished treatment Saturday. Started having pain and diarrhea this morning - When she moves or push the pain is severe. Nurse Assessment Nurse: Vallery Sa, RN, [place] Date/Time (Eastern Time): 08/08/2016 1:29:15 PM Confirm and document reason for call. If symptomatic, describe symptoms. ---Caller states she developed lower, centralized abdominal pain again this morning (pain rated as a 7 on the 1 to 10 scale). She has had about 7 episodes of diarrhea since 8:30am. There was blood present in her last bowel movement. No fever. Alert and responsive. Does the patient have any new or worsening symptoms? ---Yes Will a triage be completed? ---Yes Related visit to physician within the last 2 weeks? ---Yes Does the PT have any chronic conditions? (i.e. diabetes, asthma, etc.) ---Yes List chronic conditions. ---GERD, Diverticulitis and C-Diff recently, Hiatal Hernia Is this a behavioral health or substance abuse call? ---No Guidelines Guideline Title Affirmed Question Affirmed Notes Abdominal Pain - Female [1] SEVERE pain AND [2] age > 36 Final Disposition User Go to ED Now Vallery Sa, RN, [place] Referrals [facility] - ED Disagree/Comply: Comply

## 2016-08-08 NOTE — Discharge Instructions (Signed)
Augmentin as prescribed.  Percocet as prescribed as needed for pain.  Return to the emergency department if you develop worsening pain, high fevers, bloody stools, or other new and concerning symptoms.

## 2016-08-08 NOTE — ED Notes (Signed)
Patient transported to CT 

## 2016-08-08 NOTE — ED Provider Notes (Signed)
[facility] DEPT Provider Note   CSN: [id number] Arrival date & time: 08/08/16  1358     History   Chief Complaint Chief Complaint  Patient presents with  . Abdominal Pain  . Diarrhea  . Rectal Bleeding    HPI Tracy [name] is a 66 y.o. female.  Patient is a 66 year old female with history of recent diverticulitis with micro-perf. She was admitted here for this and treated with IV antibiotics, then had the complication of developing C. difficile colitis. She was discharged on oral vancomycin and was doing better until yesterday. She is now experiencing lower abdominal discomfort with some blood noted in her stools. She denies having bloody stool with the prior hospitalization. She denies any fevers. She called her doctor and was told to come here for reevaluation.   The history is provided by the patient.  Abdominal Pain   This is a recurrent problem. The current episode started yesterday. The problem occurs constantly. The problem has been rapidly worsening. The pain is located in the generalized abdominal region. The quality of the pain is cramping. Associated symptoms include diarrhea and hematochezia. The symptoms are aggravated by palpation. Nothing relieves the symptoms.  Diarrhea   Associated symptoms include abdominal pain.  Rectal Bleeding  Associated symptoms: abdominal pain     Past Medical History:  Diagnosis Date  . Anxiety   . Arthritis   . Bursitis of left hip   . C. difficile diarrhea   . Cancer (HCC)    basil cell  . Complication of anesthesia   . Diverticulitis   . GERD (gastroesophageal reflux disease)   . Hx: UTI (urinary tract infection)   . Hyperlipidemia   . Hyperthyroidism   . Osteopenia   . PONV (postoperative nausea and vomiting)   . Rosacea     Patient Active Problem List   Diagnosis Date Noted  . C. difficile colitis 07/16/2016  . Acute diverticulitis 07/06/2016  . Perforation of sigmoid colon due to diverticulitis 07/05/2016    . OA (osteoarthritis) of knee 08/17/2015  . Dysuria 07/01/2015  . 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 Subclinical hyperthyroidism 06/05/2014  . UTI (urinary tract infection) 05/29/2014  . Trochanteric bursitis of left hip 04/01/2014  . Rosacea 03/26/2014  . Folliculitis 81/27/5170  . Allergic rhinitis 02/19/2014  . Left hip pain 12/19/2013  . Other malaise and fatigue 11/26/2013  . Acute sinusitis 11/26/2013  . Atrophic vaginitis 10/07/2013  . RUQ pain 01/16/2012  . Screening for malignant neoplasm of the cervix 01/12/2011  . Physical exam 01/12/2011  . HYPERTRIGLYCERIDEMIA 12/03/2009  . GERD 12/03/2009  . UTI'S, RECURRENT 12/03/2009  . LEG CRAMPS, NOCTURNAL 12/03/2009  . DIVERTICULITIS, HX OF 12/03/2009  . ANXIETY 11/05/2009  . Osteopenia 11/05/2009    Past Surgical History:  Procedure Laterality Date  . APPENDECTOMY    . BACK SURGERY    . CHOLECYSTECTOMY    . FOOT SURGERY    . KNEE SURGERY    . LAPAROSCOPIC LIVER CYST FENESTRATION    . OPEN SURGICAL REPAIR OF GLUTEAL TENDON Left 04/01/2014   Procedure: LEFT HIP BURSECTOMY WITH GLUTEAL TENDON REPAIR;  Surgeon: Gearlean Alf, MD;  Location: [facility] ORS;  Service: Orthopedics;  Laterality: Left;  . TONSILLECTOMY    . TOTAL KNEE ARTHROPLASTY Right 08/17/2015   Procedure: RIGHT TOTAL KNEE ARTHROPLASTY;  Surgeon: Gaynelle Arabian, MD;  Location: [facility] ORS;  Service: Orthopedics;  Laterality: Right;  . TUBAL LIGATION      OB History  No data available       Home Medications    Prior to Admission medications   Medication Sig Start Date End Date Taking? Authorizing Provider  Calcium Carbonate-Vitamin D (CALCIUM 600+D) 600-400 MG-UNIT tablet Take 2 tablets by mouth 2 (two) times daily.   Yes [provider]  cholecalciferol (VITAMIN D) 1000 units tablet Take 1,000 Units by mouth daily.   Yes [provider]  famotidine (PEPCID) 20 MG tablet Take 1 tablet (20 mg total) by mouth 2 (two) times daily. 07/17/16  Yes  Debbe Odea, MD  fenofibrate 160 MG tablet Take 1 tablet (160 mg total) by mouth daily. 05/23/16  Yes [name], MD  Melatonin 5 MG TABS Take 5 mg by mouth at bedtime as needed (for sleep).   Yes [provider]  saccharomyces boulardii (FLORASTOR) 250 MG capsule Take 1 capsule (250 mg total) by mouth 2 (two) times daily. 07/17/16  Yes Debbe Odea, MD  vancomycin (VANCOCIN) 125 MG capsule Take 1 capsule (125 mg total) by mouth 4 (four) times daily. 07/17/16  Yes Debbe Odea, MD  vitamin B-12 (CYANOCOBALAMIN) 1000 MCG tablet Take 1,000 mcg by mouth daily.   Yes [provider]  amoxicillin-clavulanate (AUGMENTIN) 875-125 MG tablet Take 1 tablet by mouth 2 (two) times daily. Patient not taking: Reported on 08/08/2016 07/17/16   Debbe Odea, MD  feeding supplement (BOOST / RESOURCE BREEZE) LIQD Take 1 Container by mouth 3 (three) times daily between meals. Patient not taking: Reported on 07/18/2016 07/17/16   Debbe Odea, MD    Family History Family History  Problem Relation Age of Onset  . Hypertension Mother   . Stroke Mother   . Cancer Neg Hx   . Diabetes Neg Hx     Social History Social History  Substance Use Topics  . Smoking status: Never Smoker  . Smokeless tobacco: Never Used  . Alcohol use Yes     Comment: OCCASIONAL     Allergies   Demerol [meperidine]; Vicodin [hydrocodone-acetaminophen]; and Latex   Review of Systems Review of Systems  Gastrointestinal: Positive for abdominal pain, diarrhea and hematochezia.  All other systems reviewed and are negative.    Physical Exam Updated Vital Signs BP (!) 120/57 (BP Location: Left Arm)   Pulse 91   Temp 98 F (36.7 C) (Oral)   Resp 20   Ht 5' 4.5" (1.638 m)   Wt 72.6 kg (160 lb)   SpO2 100%   BMI 27.04 kg/m   Physical Exam  Constitutional: She is oriented to person, place, and time. She appears well-developed and well-nourished. No distress.  HENT:  Head: Normocephalic and  atraumatic.  Neck: Normal range of motion. Neck supple.  Cardiovascular: Normal rate and regular rhythm.  Exam reveals no gallop and no friction rub.   No murmur heard. Pulmonary/Chest: Effort normal and breath sounds normal. No respiratory distress. She has no wheezes.  Abdominal: Soft. Bowel sounds are normal. She exhibits no distension. There is tenderness. There is no rebound and no guarding.  There is significant tenderness to palpation in all 4 quadrants.  Musculoskeletal: Normal range of motion.  Neurological: She is alert and oriented to person, place, and time.  Skin: Skin is warm and dry. She is not diaphoretic.  Nursing note and vitals reviewed.    ED Treatments / Results  Labs (all labs ordered are listed, but only abnormal results are displayed) Labs Reviewed  COMPREHENSIVE METABOLIC PANEL - Abnormal; Notable for the following:  Result Value   Glucose, Bld 139 (*)    BUN 23 (*)    Calcium 10.4 (*)    GFR calc non Af Amer 57 (*)    All other components within normal limits  CBC - Abnormal; Notable for the following:    WBC 15.0 (*)    All other components within normal limits  CBG MONITORING, ED - Abnormal; Notable for the following:    Glucose-Capillary 128 (*)    All other components within normal limits  POC OCCULT BLOOD, ED  TYPE AND SCREEN    EKG  EKG Interpretation None       Radiology No results found.  Procedures Procedures (including critical care time)  Medications Ordered in ED Medications  sodium chloride 0.9 % bolus 1,000 mL (not administered)  ondansetron (ZOFRAN) injection 4 mg (not administered)     Initial Impression / Assessment and Plan / ED Course  I have reviewed the triage vital signs and the nursing notes.  Pertinent labs & imaging results that were available during my care of the patient were reviewed by me and considered in my medical decision making (see chart for details).  Patient with recent hospitalization for  diverticulitis and C. difficile colitis presenting with recurrent left lower quadrant pain. She does have a white count of 15,000, however her CT scan shows interval improvement and resolution of suspected intra-abdominal abscess.  She was given pain medicine and hydration and reports that she is feeling significantly improved. She will be given Augmentin, pain medicine, and discharged to home. We did discuss the possibility of admission, however the patient has decided that she would like to try outpatient therapy for what appears to be a recurrence of this diverticulitis.  Final Clinical Impressions(s) / ED Diagnoses   Final diagnoses:  None    New Prescriptions New Prescriptions   No medications on file     Veryl Speak, MD 08/08/16 2316

## 2016-08-09 ENCOUNTER — Encounter: Payer: Self-pay | Admitting: Family Medicine

## 2016-08-09 ENCOUNTER — Ambulatory Visit (INDEPENDENT_AMBULATORY_CARE_PROVIDER_SITE_OTHER): Payer: Medicare Other | Admitting: Gastroenterology

## 2016-08-09 ENCOUNTER — Other Ambulatory Visit: Payer: Self-pay | Admitting: Family Medicine

## 2016-08-09 ENCOUNTER — Other Ambulatory Visit (INDEPENDENT_AMBULATORY_CARE_PROVIDER_SITE_OTHER): Payer: Medicare Other

## 2016-08-09 ENCOUNTER — Encounter: Payer: Self-pay | Admitting: Gastroenterology

## 2016-08-09 ENCOUNTER — Telehealth: Payer: Self-pay | Admitting: Gastroenterology

## 2016-08-09 VITALS — BP 124/60 | HR 80 | Ht 64.5 in | Wt 163.4 lb

## 2016-08-09 DIAGNOSIS — R1032 Left lower quadrant pain: Secondary | ICD-10-CM

## 2016-08-09 DIAGNOSIS — K5732 Diverticulitis of large intestine without perforation or abscess without bleeding: Secondary | ICD-10-CM

## 2016-08-09 DIAGNOSIS — R109 Unspecified abdominal pain: Secondary | ICD-10-CM

## 2016-08-09 DIAGNOSIS — K219 Gastro-esophageal reflux disease without esophagitis: Secondary | ICD-10-CM | POA: Diagnosis not present

## 2016-08-09 DIAGNOSIS — D72829 Elevated white blood cell count, unspecified: Secondary | ICD-10-CM

## 2016-08-09 DIAGNOSIS — K5792 Diverticulitis of intestine, part unspecified, without perforation or abscess without bleeding: Secondary | ICD-10-CM

## 2016-08-09 LAB — BASIC METABOLIC PANEL
BUN: 20 mg/dL (ref 6–23)
CO2: 28 mEq/L (ref 19–32)
CREATININE: 0.83 mg/dL (ref 0.40–1.20)
Calcium: 9.9 mg/dL (ref 8.4–10.5)
Chloride: 107 mEq/L (ref 96–112)
GFR: 72.58 mL/min (ref >60.00)
GLUCOSE: 121 mg/dL — AB (ref 70–99)
Potassium: 4 mEq/L (ref 3.5–5.1)
Sodium: 141 mEq/L (ref 135–145)

## 2016-08-09 LAB — CBC WITH DIFFERENTIAL/PLATELET
BASOS PCT: 0.8 % (ref 0.0–3.0)
Basophils Absolute: 0.1 10*3/uL (ref 0.0–0.1)
EOS PCT: 0.6 % (ref 0.0–5.0)
Eosinophils Absolute: 0.1 10*3/uL (ref 0.0–0.7)
HEMATOCRIT: 38.2 % (ref 36.0–46.0)
HEMOGLOBIN: 12.8 g/dL (ref 12.0–15.0)
LYMPHS PCT: 7.7 % — AB (ref 12.0–46.0)
Lymphs Abs: 1.1 10*3/uL (ref 0.7–4.0)
MCHC: 33.6 g/dL (ref 30.0–36.0)
MCV: 89.1 fl (ref 78.0–100.0)
MONOS PCT: 3.6 % (ref 3.0–12.0)
Monocytes Absolute: 0.5 10*3/uL (ref 0.1–1.0)
NEUTROS ABS: 13 10*3/uL — AB (ref 1.4–7.7)
Neutrophils Relative %: 87.3 % — ABNORMAL HIGH (ref 43.0–77.0)
PLATELETS: 186 10*3/uL (ref 150.0–400.0)
RBC: 4.29 Mil/uL (ref 3.87–5.11)
RDW: 13.7 % (ref 11.5–15.5)
WBC: 14.9 10*3/uL — AB (ref 4.0–10.5)

## 2016-08-09 MED ORDER — AMOXICILLIN-POT CLAVULANATE 875-125 MG PO TABS: 1.0000 | ORAL_TABLET | Freq: Two times a day (BID) | ORAL | 0 refills | Status: DC

## 2016-08-09 NOTE — Patient Instructions (Signed)
We will send Augmentin to your pharmacy  Stop Metamucil   Use Gaviscon after meals  Go to the basement for labs  Avoid High Fiber     Low-Fiber Diet Fiber is found in fruits, vegetables, and whole grains. A low-fiber diet restricts fibrous foods that are not digested in the small intestine. A diet containing about 10-15 grams of fiber per day is considered low fiber. Low-fiber diets may be used to:  Promote healing and rest the bowel during intestinal flare-ups.  Prevent blockage of a partially obstructed or narrowed gastrointestinal tract.  Reduce fecal weight and volume.  Slow the movement of feces.  You may be on a low-fiber diet as a transitional diet following surgery, after an injury (trauma), or because of a short (acute) or lifelong (chronic) illness. Your health care provider will determine the length of time you need to stay on this diet. What do I need to know about a low-fiber diet? Always check the fiber content on the packaging's Nutrition Facts label, especially on foods from the grains list. Ask your dietitian if you have questions about specific foods that are related to your condition, especially if the food is not listed below. In general, a low-fiber food will have less than 2 g of fiber. What foods can I eat? Grains All breads and crackers made with white flour. Sweet rolls, doughnuts, waffles, pancakes, [place] toast, bagels. Pretzels, Melba toast, zwieback. Well-cooked cereals, such as cornmeal, farina, or cream cereals. Dry cereals that do not contain whole grains, fruit, or nuts, such as refined corn, wheat, rice, and oat cereals. Potatoes prepared any way without skins, plain pastas and noodles, refined white rice. Use white flour for baking and making sauces. Use allowed list of grains for casseroles, dumplings, and puddings. Vegetables Strained tomato and vegetable juices. Fresh lettuce, cucumber, spinach. Well-cooked (no skin or pulp) or canned vegetables, such  as asparagus, bean sprouts, beets, carrots, green beans, mushrooms, potatoes, pumpkin, spinach, yellow squash, tomato sauce/puree, turnips, yams, and zucchini. Keep servings limited to  cup. Fruits All fruit juices except prune juice. Cooked or canned fruits without skin and seeds, such as applesauce, apricots, cherries, fruit cocktail, grapefruit, grapes, mandarin oranges, melons, peaches, pears, pineapple, and plums. Fresh fruits without skin, such as apricots, avocados, bananas, melons, pineapple, nectarines, and peaches. Keep servings limited to  cup or 1 piece. Meat and Other Protein Sources Ground or well-cooked tender beef, ham, veal, lamb, pork, or poultry. Eggs, plain cheese. Fish, oysters, shrimp, lobster, and other seafood. Liver, organ meats. Smooth nut butters. Dairy All milk products and alternative dairy substitutes, such as soy, rice, almond, and coconut, not containing added whole nuts, seeds, or added fruit. Beverages Decaf coffee, fruit, and vegetable juices or smoothies (small amounts, with no pulp or skins, and with fruits from allowed list), sports drinks, herbal tea. Condiments Ketchup, mustard, vinegar, cream sauce, cheese sauce, cocoa powder. Spices in moderation, such as allspice, basil, bay leaves, celery powder or leaves, cinnamon, cumin powder, curry powder, ginger, mace, marjoram, onion or garlic powder, oregano, paprika, parsley flakes, ground pepper, rosemary, sage, savory, tarragon, thyme, and turmeric. Sweets and Desserts Plain cakes and cookies, pie made with allowed fruit, pudding, custard, cream pie. Gelatin, fruit, ice, sherbet, frozen ice pops. Ice cream, ice milk without nuts. Plain hard candy, honey, jelly, molasses, syrup, sugar, chocolate syrup, gumdrops, marshmallows. Limit overall sugar intake. Fats and Oil Margarine, butter, cream, mayonnaise, salad oils, plain salad dressings made from allowed foods. Choose healthy fats  such as olive oil, canola oil, and  omega-3 fatty acids (such as found in salmon or tuna) when possible. Other Bouillon, broth, or cream soups made from allowed foods. Any strained soup. Casseroles or mixed dishes made with allowed foods. The items listed above may not be a complete list of recommended foods or beverages. Contact your dietitian for more options. What foods are not recommended? Grains All whole wheat and whole grain breads and crackers. Multigrains, rye, bran seeds, nuts, or coconut. Cereals containing whole grains, multigrains, bran, coconut, nuts, raisins. Cooked or dry oatmeal, steel-cut oats. Coarse wheat cereals, granola. Cereals advertised as high fiber. Potato skins. Whole grain pasta, wild or brown rice. Popcorn. Coconut flour. Bran, buckwheat, corn bread, multigrains, rye, wheat germ. Vegetables Fresh, cooked or canned vegetables, such as artichokes, asparagus, beet greens, broccoli, Brussels sprouts, cabbage, celery, cauliflower, corn, eggplant, kale, legumes or beans, okra, peas, and tomatoes. Avoid large servings of any vegetables, especially raw vegetables. Fruits Fresh fruits, such as apples with or without skin, berries, cherries, figs, grapes, grapefruit, guavas, kiwis, mangoes, oranges, papayas, pears, persimmons, pineapple, and pomegranate. Prune juice and juices with pulp, stewed or dried prunes. Dried fruits, dates, raisins. Fruit seeds or skins. Avoid large servings of all fresh fruits. Meats and Other Protein Sources Tough, fibrous meats with gristle. Chunky nut butter. Cheese made with seeds, nuts, or other foods not recommended. Nuts, seeds, legumes (beans, including baked beans), dried peas, beans, lentils. Dairy Yogurt or cheese that contains nuts, seeds, or added fruit. Beverages Fruit juices with high pulp, prune juice. Caffeinated coffee and teas. Condiments Coconut, maple syrup, pickles, olives. Sweets and Desserts Desserts, cookies, or candies that contain nuts or coconut, chunky  peanut butter, dried fruits. Jams, preserves with seeds, marmalade. Large amounts of sugar and sweets. Any other dessert made with fruits from the not recommended list. Other Soups made from vegetables that are not recommended or that contain other foods not recommended. The items listed above may not be a complete list of foods and beverages to avoid. Contact your dietitian for more information. This information is not intended to replace advice given to you by your health care provider. Make sure you discuss any questions you have with your health care provider. Document Released: 08/06/2001 Document Revised: 07/23/2015 Document Reviewed: 01/07/2013 Elsevier Interactive Patient Education  2017 [employer]       Diverticulitis Diverticulitis is inflammation or infection of small pouches in your colon that form when you have a condition called diverticulosis. The pouches in your colon are called diverticula. Your colon, or large intestine, is where water is absorbed and stool is formed. Complications of diverticulitis can include:  Bleeding.  Severe infection.  Severe pain.  Perforation of your colon.  Obstruction of your colon.  What are the causes? Diverticulitis is caused by bacteria. Diverticulitis happens when stool becomes trapped in diverticula. This allows bacteria to grow in the diverticula, which can lead to inflammation and infection. What increases the risk? People with diverticulosis are at risk for diverticulitis. Eating a diet that does not include enough fiber from fruits and vegetables may make diverticulitis more likely to develop. What are the signs or symptoms? Symptoms of diverticulitis may include:  Abdominal pain and tenderness. The pain is normally located on the left side of the abdomen, but may occur in other areas.  Fever and chills.  Bloating.  Cramping.  Nausea.  Vomiting.  Constipation.  Diarrhea.  Blood in your stool.  How is this  diagnosed?  Your health care provider will ask you about your medical history and do a physical exam. You may need to have tests done because many medical conditions can cause the same symptoms as diverticulitis. Tests may include:  Blood tests.  Urine tests.  Imaging tests of the abdomen, including X-rays and CT scans.  When your condition is under control, your health care provider may recommend that you have a colonoscopy. A colonoscopy can show how severe your diverticula are and whether something else is causing your symptoms. How is this treated? Most cases of diverticulitis are mild and can be treated at home. Treatment may include:  Taking over-the-counter pain medicines.  Following a clear liquid diet.  Taking antibiotic medicines by mouth for 7-10 days.  More severe cases may be treated at a hospital. Treatment may include:  Not eating or drinking.  Taking prescription pain medicine.  Receiving antibiotic medicines through an IV tube.  Receiving fluids and nutrition through an IV tube.  Surgery.  Follow these instructions at home:  Follow your health care provider's instructions carefully.  Follow a full liquid diet or other diet as directed by your health care provider. After your symptoms improve, your health care provider may tell you to change your diet. He or she may recommend you eat a high-fiber diet. Fruits and vegetables are good sources of fiber. Fiber makes it easier to pass stool.  Take fiber supplements or probiotics as directed by your health care provider.  Only take medicines as directed by your health care provider.  Keep all your follow-up appointments. Contact a health care provider if:  Your pain does not improve.  You have a hard time eating food.  Your bowel movements do not return to normal. Get help right away if:  Your pain becomes worse.  Your symptoms do not get better.  Your symptoms suddenly get worse.  You have a  fever.  You have repeated vomiting.  You have bloody or black, tarry stools. This information is not intended to replace advice given to you by your health care provider. Make sure you discuss any questions you have with your health care provider. Document Released: 11/24/2004 Document Revised: 07/23/2015 Document Reviewed: 01/09/2013 Elsevier Interactive Patient Education  2017 [employer].

## 2016-08-09 NOTE — Telephone Encounter (Signed)
-----   Message from Milus Banister, MD sent at 08/09/2016 12:25 PM EDT ----- Regarding: RE: stat GI appt My office schedule is full this afternoon and I'm in [place] tomorrow. No extender appts today or tomorrow.   Dr. Silverio Decamp has 2 openings this afternoon (one is a banding spot, one is ROV). Dr. Henrene Pastor has one opening tomorrow afternoon ([place]) . Dr. Loletha Carrow has one opening tomorrow afternoon (banding spot).   Can you please offer one of those four open spots in next 1-2 days? Would avoid the 'banding' spots unless the other two appts cannot work out.   Thanks   ----- Message ----- From: Irven [place] Sent: 08/09/2016  12:02 PM To: Milus Banister, MD Subject: FW: stat GI appt                               Dr. Ardis Hughs,  You are Doc of the Day. Please advise.  Thank You Steph  ----- Message ----- From: Marlon Pel, RN Sent: 08/09/2016  11:58 AM To: Irven Baltimore Subject: FW: stat GI appt                                 ----- Message ----- From: Ladene Artist, MD Sent: 08/09/2016  11:44 AM To: Marlon Pel, RN Subject: FW: stat GI appt                                 ----- Message ----- From: Ladene Artist, MD Sent: 08/09/2016  11:40 AM To: [name], MD Subject: RE: stat GI appt                               Dr. Birdie Riddle called and wants this new pt to [facility] seen soon. DOD can review regarding office or hospital admit. Abdominal pain, elevated WBC, recently in ED.  ----- Message ----- From: [name], MD Sent: 08/09/2016  11:35 AM To: Ladene Artist, MD Subject: stat GI appt                                   Tracy [name]-  This is the lady we were texting about who needs an appt.  Thanks for taking a look at her case.  I'm afraid she's going back to the hospital if we can't get her straight.  Welcome Back!  ;) [name]

## 2016-08-09 NOTE — Progress Notes (Signed)
Tracy [name]    [id number]    1951/01/11  Primary Care Physician:Tabori, Aundra Millet, MD  Referring Physician: [name], MD [name] [place], [place] [address]  Chief complaint:  Left lower quadrant abdominal pain  HPI:  66 year old female with history of severe sigmoid diverticulosis status post recent hospitalization from May 8- May 20 for complicated sigmoid diverticulitis with microperforation and small abscess which was eventually treated with IV Zosyn followed by oral Augmentin. He also developed C. difficile colitis during the hospitalization treated with oral vancomycin. Patient developed worsening left-sided abdominal pain yesterday and went to the ER. She had multiple formed bowel movements yesterday and noted small volume bright red blood per rectum when she wiped. No blood or clots mixed in the stool. No fever but has chills and she also feels weak with poor by mouth intake. No liquid diarrhea. She hasn't had any bowel movements today.  She was given amoxicillin 500 mg 3 times a day. Hasn't noticed any significant improvement in the pain CT abdomen and pelvis showed interval improvement of abscess. There is focal area of pericolonic edema/inflammation associated with proximal sigmoid segment with no evidence of perforation. White count was elevated to 15.  Denies any nausea or vomiting.  She was previously followed by Dr. Wynetta Emery. Last colonoscopy in 2012 with findings suggestive of ischemic colitis. Prior to that she had colonoscopy in 2002 that was normal per patient, records not available to review during this visit  Outpatient Encounter Prescriptions as of 08/09/2016  Medication Sig  . amoxicillin-clavulanate (AUGMENTIN) 500-125 MG tablet Take 1 tablet (500 mg total) by mouth every 8 (eight) hours.  . Calcium Carbonate-Vitamin D (CALCIUM 600+D) 600-400 MG-UNIT tablet Take 2 tablets by mouth 2 (two) times daily.  . cholecalciferol (VITAMIN  D) 1000 units tablet Take 1,000 Units by mouth daily.  . famotidine (PEPCID) 20 MG tablet Take 1 tablet (20 mg total) by mouth 2 (two) times daily.  . fenofibrate 160 MG tablet Take 1 tablet (160 mg total) by mouth daily.  . Melatonin 5 MG TABS Take 5 mg by mouth at bedtime as needed (for sleep).  Marland Kitchen oxyCODONE-acetaminophen (PERCOCET) 5-325 MG tablet Take 1-2 tablets by mouth every 6 (six) hours as needed.  . saccharomyces boulardii (FLORASTOR) 250 MG capsule Take 1 capsule (250 mg total) by mouth 2 (two) times daily.  . vitamin B-12 (CYANOCOBALAMIN) 1000 MCG tablet Take 1,000 mcg by mouth daily.  . feeding supplement (BOOST / RESOURCE BREEZE) LIQD Take 1 Container by mouth 3 (three) times daily between meals. (Patient not taking: Reported on 07/18/2016)  . [DISCONTINUED] vancomycin (VANCOCIN) 125 MG capsule Take 1 capsule (125 mg total) by mouth 4 (four) times daily.   No facility-administered encounter medications on file as of 08/09/2016.     Allergies as of 08/09/2016 - Review Complete 08/09/2016  Allergen Reaction Noted  . Demerol [meperidine] Nausea And Vomiting 12/13/2013  . Vicodin [hydrocodone-acetaminophen] Other (See Comments) 07/27/2016  . Latex Rash and Other (See Comments) 07/28/2015    Past Medical History:  Diagnosis Date  . Anxiety   . Arthritis   . Bursitis of left hip   . C. difficile diarrhea   . Cancer (HCC)    basil cell  . Complication of anesthesia   . Diverticulitis   . GERD (gastroesophageal reflux disease)   . Hx: UTI (urinary tract infection)   . Hyperlipidemia   . Hyperthyroidism   .  Osteopenia   . PONV (postoperative nausea and vomiting)   . Rosacea     Past Surgical History:  Procedure Laterality Date  . APPENDECTOMY    . BACK SURGERY    . CHOLECYSTECTOMY    . FOOT SURGERY Right    X 2  . KNEE SURGERY    . LAPAROSCOPIC LIVER CYST FENESTRATION    . OPEN SURGICAL REPAIR OF GLUTEAL TENDON Left 04/01/2014   Procedure: LEFT HIP BURSECTOMY WITH  GLUTEAL TENDON REPAIR;  Surgeon: Gearlean Alf, MD;  Location: [facility];  Service: Orthopedics;  Laterality: Left;  . TONSILLECTOMY    . TOTAL KNEE ARTHROPLASTY Right 08/17/2015   Procedure: RIGHT TOTAL KNEE ARTHROPLASTY;  Surgeon: Gaynelle Arabian, MD;  Location: [facility];  Service: Orthopedics;  Laterality: Right;  . TUBAL LIGATION      Family History  Problem Relation Age of Onset  . Hypertension Mother   . Stroke Mother   . Liver disease Father        alcohol related  . Bone cancer Paternal Grandfather   . Cancer Neg Hx   . Diabetes Neg Hx   . Colon cancer Neg Hx   . Esophageal cancer Neg Hx   . Pancreatic cancer Neg Hx   . Stomach cancer Neg Hx     Social History   Social History  . Marital status: Married    Spouse name: N/A  . Number of children: N/A  . Years of education: N/A   Occupational History  . Not on file.   Social History Main Topics  . Smoking status: Never Smoker  . Smokeless tobacco: Never Used  . Alcohol use No  . Drug use: No  . Sexual activity: Not on file   Other Topics Concern  . Not on file   Social History Narrative  . No narrative on file      Review of systems: Review of Systems  Constitutional: Negative for fever and chills.  positive for fatigue HENT: Negative.   Eyes: Negative for blurred vision.  Respiratory: Negative for cough, shortness of breath and wheezing.   Cardiovascular: Negative for chest pain and palpitations.  Gastrointestinal: as per HPI Genitourinary: Negative for dysuria, urgency, frequency and hematuria.  Musculoskeletal: Positive for myalgias, back pain and joint pain.  Skin: Negative for itching and rash.  Neurological: Negative for dizziness, tremors, focal weakness, seizures and loss of consciousness.   Endo/Heme/Allergies: Positive for seasonal allergies.  Psychiatric/Behavioral: Negative for depression, suicidal ideas and hallucinations.  positive for anxiety All other systems reviewed and are  negative.   Physical Exam: Vitals:   08/09/16 1409  BP: 124/60  Pulse: 80   Body mass index is 27.61 kg/m. Gen:      No acute distress HEENT:  EOMI, sclera anicteric Neck:     No masses; no thyromegaly Lungs:    Clear to auscultation bilaterally; normal respiratory effort CV:         Regular rate and rhythm; no murmurs Abd:      + bowel sounds; soft, Left lower quadrant tender; no palpable masses, no distension Ext:    Trace edema; adequate peripheral perfusion Skin:      Warm and dry; no rash Neuro: alert and oriented x 3 Psych: normal mood and affect  Data Reviewed:  Reviewed labs, radiology imaging, old records and pertinent past GI work up   Assessment and Plan/Recommendations:  66 year old female status post cholecystectomy, recent hospitalization in May with complicated diverticulitis, microperforation and abscess,  C. difficile colitis with recurrent episode of sigmoid diverticulitis.   Will treat with Augmentin 875 mg twice daily for 14 days.  Recheck CBC and BMP Advise patient to stay on low fiber diet and increase fluid intake  GERD: Continue Pepcid twice daily Gaviscon after meals as needed Lifestyle modification and antireflux measures She is hesitant to take PPI given potential side effects, she was on PPI for long-term  Follow-up in office next week  We'll consider colonoscopy 6-8 weeks after resolution of her symptoms Patient may benefit from segmental resection of sigmoid colon given recurrent episodes of diverticulitis, we will consider referral to Dr. Johnathan Hausen after colonoscopy  Advised patient to call with any worsening or change in symptoms    K. Denzil Magnuson , MD [phone] Mon-Fri 8a-5p [phone] after 5p, weekends, holidays  CC: [name], MD

## 2016-08-09 NOTE — Telephone Encounter (Signed)
Appointment scheduled this afternoon with Dr. Silverio Decamp.

## 2016-08-11 ENCOUNTER — Telehealth: Payer: Self-pay | Admitting: Gastroenterology

## 2016-08-11 ENCOUNTER — Inpatient Hospital Stay (HOSPITAL_COMMUNITY): Payer: Medicare Other

## 2016-08-11 ENCOUNTER — Ambulatory Visit: Payer: Medicare Other | Admitting: Gastroenterology

## 2016-08-11 ENCOUNTER — Encounter (HOSPITAL_COMMUNITY): Payer: Self-pay

## 2016-08-11 ENCOUNTER — Ambulatory Visit (INDEPENDENT_AMBULATORY_CARE_PROVIDER_SITE_OTHER): Payer: Medicare Other | Admitting: Gastroenterology

## 2016-08-11 ENCOUNTER — Encounter: Payer: Self-pay | Admitting: Gastroenterology

## 2016-08-11 ENCOUNTER — Inpatient Hospital Stay (HOSPITAL_COMMUNITY)
Admission: AD | Admit: 2016-08-11 | Discharge: 2016-08-15 | DRG: 392 | Disposition: A | Discharge status: Home / Self Care | Payer: Medicare Other | Source: Ambulatory Visit | Attending: Internal Medicine | Admitting: Internal Medicine

## 2016-08-11 VITALS — BP 108/68 | HR 74 | Ht 64.0 in | Wt 161.0 lb

## 2016-08-11 DIAGNOSIS — E440 Moderate protein-calorie malnutrition: Secondary | ICD-10-CM | POA: Diagnosis present

## 2016-08-11 DIAGNOSIS — Z96651 Presence of right artificial knee joint: Secondary | ICD-10-CM | POA: Diagnosis present

## 2016-08-11 DIAGNOSIS — E0590 Thyrotoxicosis, unspecified without thyrotoxic crisis or storm: Secondary | ICD-10-CM | POA: Diagnosis present

## 2016-08-11 DIAGNOSIS — M8580 Other specified disorders of bone density and structure, unspecified site: Secondary | ICD-10-CM | POA: Diagnosis present

## 2016-08-11 DIAGNOSIS — K5792 Diverticulitis of intestine, part unspecified, without perforation or abscess without bleeding: Secondary | ICD-10-CM | POA: Diagnosis not present

## 2016-08-11 DIAGNOSIS — M1990 Unspecified osteoarthritis, unspecified site: Secondary | ICD-10-CM | POA: Diagnosis present

## 2016-08-11 DIAGNOSIS — R933 Abnormal findings on diagnostic imaging of other parts of digestive tract: Secondary | ICD-10-CM | POA: Diagnosis not present

## 2016-08-11 DIAGNOSIS — K219 Gastro-esophageal reflux disease without esophagitis: Secondary | ICD-10-CM | POA: Diagnosis present

## 2016-08-11 DIAGNOSIS — R109 Unspecified abdominal pain: Secondary | ICD-10-CM | POA: Diagnosis present

## 2016-08-11 DIAGNOSIS — F418 Other specified anxiety disorders: Secondary | ICD-10-CM | POA: Diagnosis not present

## 2016-08-11 DIAGNOSIS — Z9049 Acquired absence of other specified parts of digestive tract: Secondary | ICD-10-CM

## 2016-08-11 DIAGNOSIS — R509 Fever, unspecified: Secondary | ICD-10-CM

## 2016-08-11 DIAGNOSIS — Z79899 Other long term (current) drug therapy: Secondary | ICD-10-CM

## 2016-08-11 DIAGNOSIS — R52 Pain, unspecified: Secondary | ICD-10-CM

## 2016-08-11 DIAGNOSIS — Z8619 Personal history of other infectious and parasitic diseases: Secondary | ICD-10-CM | POA: Diagnosis not present

## 2016-08-11 DIAGNOSIS — E042 Nontoxic multinodular goiter: Secondary | ICD-10-CM | POA: Diagnosis present

## 2016-08-11 DIAGNOSIS — K5900 Constipation, unspecified: Secondary | ICD-10-CM | POA: Diagnosis not present

## 2016-08-11 DIAGNOSIS — R5383 Other fatigue: Secondary | ICD-10-CM | POA: Diagnosis not present

## 2016-08-11 DIAGNOSIS — K5732 Diverticulitis of large intestine without perforation or abscess without bleeding: Secondary | ICD-10-CM | POA: Diagnosis not present

## 2016-08-11 DIAGNOSIS — R1032 Left lower quadrant pain: Secondary | ICD-10-CM | POA: Diagnosis present

## 2016-08-11 DIAGNOSIS — L719 Rosacea, unspecified: Secondary | ICD-10-CM | POA: Diagnosis present

## 2016-08-11 DIAGNOSIS — Z6827 Body mass index (BMI) 27.0-27.9, adult: Secondary | ICD-10-CM | POA: Diagnosis not present

## 2016-08-11 DIAGNOSIS — Z91040 Latex allergy status: Secondary | ICD-10-CM

## 2016-08-11 DIAGNOSIS — R140 Abdominal distension (gaseous): Secondary | ICD-10-CM | POA: Diagnosis not present

## 2016-08-11 DIAGNOSIS — F419 Anxiety disorder, unspecified: Secondary | ICD-10-CM | POA: Diagnosis present

## 2016-08-11 DIAGNOSIS — Z885 Allergy status to narcotic agent status: Secondary | ICD-10-CM | POA: Diagnosis not present

## 2016-08-11 DIAGNOSIS — E781 Pure hyperglyceridemia: Secondary | ICD-10-CM | POA: Diagnosis present

## 2016-08-11 DIAGNOSIS — R4589 Other symptoms and signs involving emotional state: Secondary | ICD-10-CM

## 2016-08-11 LAB — CBC WITH DIFFERENTIAL/PLATELET
BASOS ABS: 0 10*3/uL (ref 0.0–0.1)
Basophils Relative: 0 %
Eosinophils Absolute: 0.2 10*3/uL (ref 0.0–0.7)
Eosinophils Relative: 2 %
HEMATOCRIT: 34.5 % — AB (ref 36.0–46.0)
Hemoglobin: 11.8 g/dL — ABNORMAL LOW (ref 12.0–15.0)
LYMPHS PCT: 15 %
Lymphs Abs: 1.2 10*3/uL (ref 0.7–4.0)
MCH: 29.9 pg (ref 26.0–34.0)
MCHC: 34.2 g/dL (ref 30.0–36.0)
MCV: 87.3 fL (ref 78.0–100.0)
MONO ABS: 0.6 10*3/uL (ref 0.1–1.0)
MONOS PCT: 8 %
NEUTROS ABS: 5.8 10*3/uL (ref 1.7–7.7)
Neutrophils Relative %: 75 %
Platelets: 201 10*3/uL (ref 150–400)
RBC: 3.95 MIL/uL (ref 3.87–5.11)
RDW: 13.5 % (ref 11.5–15.5)
WBC: 7.8 10*3/uL (ref 4.0–10.5)

## 2016-08-11 LAB — COMPREHENSIVE METABOLIC PANEL
ALT: 15 U/L (ref 14–54)
AST: 18 U/L (ref 15–41)
Albumin: 3.5 g/dL (ref 3.5–5.0)
Alkaline Phosphatase: 76 U/L (ref 38–126)
Anion gap: 6 (ref 5–15)
BILIRUBIN TOTAL: 0.4 mg/dL (ref 0.3–1.2)
BUN: 12 mg/dL (ref 6–20)
CALCIUM: 9.1 mg/dL (ref 8.9–10.3)
CO2: 27 mmol/L (ref 22–32)
Chloride: 105 mmol/L (ref 101–111)
Creatinine, Ser: 0.76 mg/dL (ref 0.44–1.00)
GFR calc Af Amer: >60 mL/min (ref >60)
Glucose, Bld: 101 mg/dL — ABNORMAL HIGH (ref 65–99)
POTASSIUM: 3.9 mmol/L (ref 3.5–5.1)
Sodium: 138 mmol/L (ref 135–145)
TOTAL PROTEIN: 6.9 g/dL (ref 6.5–8.1)

## 2016-08-11 MED ORDER — ALUM HYDROXIDE-MAG CARBONATE 95-358 MG/15ML PO SUSP
15.0000 mL | Freq: Three times a day (TID) | ORAL | Status: DC | PRN
  Filled 2016-08-11: qty 15

## 2016-08-11 MED ORDER — PIPERACILLIN-TAZOBACTAM 3.375 G IVPB
3.3750 g | Freq: Three times a day (TID) | INTRAVENOUS | Status: DC
  Administered 2016-08-11 – 2016-08-15 (×11): 3.375 g via INTRAVENOUS
  Filled 2016-08-11 (×12): qty 50

## 2016-08-11 MED ORDER — ALUM HYDROXIDE-MAG TRISILICATE 80-20 MG PO CHEW
1.0000 | CHEWABLE_TABLET | Freq: Three times a day (TID) | ORAL | Status: DC | PRN
  Administered 2016-08-13 – 2016-08-14 (×2): 1 via ORAL
  Filled 2016-08-11 (×3): qty 1

## 2016-08-11 MED ORDER — ONDANSETRON HCL 4 MG PO TABS: 4.0000 mg | ORAL_TABLET | Freq: Four times a day (QID) | ORAL | Status: DC | PRN

## 2016-08-11 MED ORDER — SODIUM CHLORIDE 0.9 % IV SOLN
INTRAVENOUS | Status: DC
  Administered 2016-08-11 – 2016-08-13 (×3): via INTRAVENOUS

## 2016-08-11 MED ORDER — ONDANSETRON HCL 4 MG/2ML IJ SOLN
4.0000 mg | Freq: Four times a day (QID) | INTRAMUSCULAR | Status: DC | PRN
  Administered 2016-08-13 – 2016-08-14 (×2): 4 mg via INTRAVENOUS
  Filled 2016-08-11 (×2): qty 2

## 2016-08-11 MED ORDER — ENOXAPARIN SODIUM 40 MG/0.4ML ~~LOC~~ SOLN
40.0000 mg | SUBCUTANEOUS | Status: DC
  Administered 2016-08-11 – 2016-08-14 (×4): 40 mg via SUBCUTANEOUS
  Filled 2016-08-11 (×4): qty 0.4

## 2016-08-11 MED ORDER — SACCHAROMYCES BOULARDII 250 MG PO CAPS
250.0000 mg | ORAL_CAPSULE | Freq: Two times a day (BID) | ORAL | Status: DC
  Administered 2016-08-11 – 2016-08-15 (×8): 250 mg via ORAL
  Filled 2016-08-11 (×8): qty 1

## 2016-08-11 MED ORDER — OXYCODONE-ACETAMINOPHEN 5-325 MG PO TABS
1.0000 | ORAL_TABLET | Freq: Four times a day (QID) | ORAL | Status: DC | PRN
Start: 2016-08-11 — End: 2016-08-15
  Administered 2016-08-11 – 2016-08-12 (×4): 1 via ORAL
  Administered 2016-08-12: 2 via ORAL
  Administered 2016-08-13 (×2): 1 via ORAL
  Administered 2016-08-13: 2 via ORAL
  Filled 2016-08-11: qty 1
  Filled 2016-08-11 (×2): qty 2
  Filled 2016-08-11: qty 1
  Filled 2016-08-11: qty 2
  Filled 2016-08-11 (×4): qty 1

## 2016-08-11 NOTE — Patient Instructions (Signed)
We are sending you to [facility] to be admitted today

## 2016-08-11 NOTE — H&P (Signed)
History and Physical    Tracy [name]:[id number] DOB: 01-26-1951 DOA: 08/11/2016  I have briefly reviewed the patient's prior medical records in [place]  PCP: [name], MD  Patient coming from: Home   Chief Complaint: abdominal pain  HPI: Tracy [name] is a 66 y.o. female with medical history significant of hyperlipidemia, thyroid nodules, comes to the hospital from gastroenterology office due to abdominal pain. Patient was recently hospitalized and discharged about 3 weeks ago with acute diverticulitis with abscess and microperforations.  She was slow to respond, and she has not been in the hospital for 12 days.  General surgery was consulted and have follow patient while hospitalized.  She eventually improved, and she was discharged on p.o. Augmentin.  Her hospital course was complicated by diarrhea which turned out to be positive for C. difficile.  She was treated with p.o. vancomycin.  She felt when she went home, and actually felt good that week and was out on vacation.  Startin 3 days ago on Monday, patient has been started to have recurrent left lower quadrant abdominal pain with intermittent fevers in the evening.  She went to the emergency room on 6/11 she was placed on amoxicillin.  She saw her gastroenterologist Dr. Silverio Decamp On 6/12 she was placed on Augmentin with plans for 14 days.  Patient continued to have worsening abdominal pain, as well as fevers every evening, and decided to see her GI doctor again today.  She was direct admitted for IV antibiotics.  Patient currently denies any diarrhea, she denies any nausea or vomiting.  She has no chest pain or shortness of breath.  She denies palpitations.  Other than abdominal pain, has no other complaints  Review of Systems: As per HPI otherwise 10 point review of systems negative.   Past Medical History:  Diagnosis Date  . Anxiety   . Arthritis   . Bursitis of left hip   . C. difficile diarrhea   .  Cancer (HCC)    basil cell  . Complication of anesthesia   . Diverticulitis   . GERD (gastroesophageal reflux disease)   . Hx: UTI (urinary tract infection)   . Hyperlipidemia   . Hyperthyroidism   . Osteopenia   . PONV (postoperative nausea and vomiting)   . Rosacea     Past Surgical History:  Procedure Laterality Date  . APPENDECTOMY    . BACK SURGERY    . CHOLECYSTECTOMY    . FOOT SURGERY Right    X 2  . KNEE SURGERY    . LAPAROSCOPIC LIVER CYST FENESTRATION    . OPEN SURGICAL REPAIR OF GLUTEAL TENDON Left 04/01/2014   Procedure: LEFT HIP BURSECTOMY WITH GLUTEAL TENDON REPAIR;  Surgeon: Gearlean Alf, MD;  Location: [facility];  Service: Orthopedics;  Laterality: Left;  . TONSILLECTOMY    . TOTAL KNEE ARTHROPLASTY Right 08/17/2015   Procedure: RIGHT TOTAL KNEE ARTHROPLASTY;  Surgeon: Gaynelle Arabian, MD;  Location: [facility];  Service: Orthopedics;  Laterality: Right;  . TUBAL LIGATION       reports that she has never smoked. She has never used smokeless tobacco. She reports that she does not drink alcohol or use drugs.  Allergies  Allergen Reactions  . Demerol [Meperidine] Nausea And Vomiting  . Vicodin [Hydrocodone-Acetaminophen] Other (See Comments)    hallucinations  . Latex Rash and Other (See Comments)    Reaction:  Blisters     Family History  Problem Relation Age of Onset  .  Hypertension Mother   . Stroke Mother   . Liver disease Father        alcohol related  . Bone cancer Paternal Grandfather   . Cancer Neg Hx   . Diabetes Neg Hx   . Colon cancer Neg Hx   . Esophageal cancer Neg Hx   . Pancreatic cancer Neg Hx   . Stomach cancer Neg Hx     Prior to Admission medications   Medication Sig Start Date End Date Taking? Authorizing Provider  amoxicillin-clavulanate (AUGMENTIN) 875-125 MG tablet Take 1 tablet by mouth 2 (two) times daily. 08/09/16  Yes Nandigam, Venia Minks, MD  famotidine (PEPCID) 20 MG tablet Take 1 tablet (20 mg total) by mouth 2 (two) times  daily. 07/17/16  Yes Debbe Odea, MD  fluticasone (FLONASE) 50 MCG/ACT nasal spray Place 1 spray into both nostrils daily.   Yes [provider]  oxyCODONE-acetaminophen (PERCOCET) 5-325 MG tablet Take 1-2 tablets by mouth every 6 (six) hours as needed. 08/08/16  Yes Delo, Nathaneil Canary, MD  saccharomyces boulardii (FLORASTOR) 250 MG capsule Take 1 capsule (250 mg total) by mouth 2 (two) times daily. 07/17/16  Yes Debbe Odea, MD  fenofibrate 160 MG tablet Take 1 tablet (160 mg total) by mouth daily. Patient not taking: Reported on 08/11/2016 05/23/16   [name], MD    Physical Exam: Vitals:   08/11/16 1647  BP: 115/64  Pulse: 77  Resp: 18  Temp: 98.3 F (36.8 C)  TempSrc: Oral  SpO2: 100%  Weight: 73 kg (161 lb)  Height: 5\' 4"  (1.626 m)    Constitutional: NAD, calm, comfortable Eyes: lids and conjunctivae normal ENMT: Mucous membranes are moist Respiratory: clear to auscultation bilaterally, no wheezing, no crackles.  Cardiovascular: Regular rate and rhythm, no murmurs / rubs / gallops. No extremity edema. 2+ pedal pulses.  Abdomen: Tenderness to palpation in the left lower quadrant and mid abdomen, no masses palpated. Bowel sounds positive.  No guarding, no rebound Musculoskeletal: no clubbing / cyanosis. Normal muscle tone.  Skin: no rashes, lesions, ulcers. No induration Neurologic: CN 2-12 grossly intact. Strength 5/5 in all 4.  Psychiatric: Normal judgment and insight. Alert and oriented x 3. Normal mood.   Labs on Admission: I have personally reviewed following labs and imaging studies  CBC:  Recent Labs Lab 08/08/16 1749 08/09/16 1539  WBC 15.0* 14.9*  NEUTROABS  --  13.0*  HGB 13.9 12.8  HCT 41.0 38.2  MCV 88.7 89.1  PLT 190 186.0   Basic Metabolic Panel:  Recent Labs Lab 08/08/16 1749 08/09/16 1539  NA 141 141  K 4.0 4.0  CL 106 107  CO2 27 28  GLUCOSE 139* 121*  BUN 23* 20  CREATININE 1.00 0.83  CALCIUM 10.4* 9.9   GFR: Estimated  Creatinine Clearance: 63.5 mL/min (by C-G formula based on SCr of 0.83 mg/dL). Liver Function Tests:  Recent Labs Lab 08/08/16 1749  AST 32  ALT 24  ALKPHOS 69  BILITOT 1.0  PROT 7.8  ALBUMIN 4.6   No results for input(s): LIPASE, AMYLASE in the last 168 hours. No results for input(s): AMMONIA in the last 168 hours. Coagulation Profile: No results for input(s): INR, PROTIME in the last 168 hours. Cardiac Enzymes: No results for input(s): CKTOTAL, CKMB, CKMBINDEX, TROPONINI in the last 168 hours. BNP (last 3 results) No results for input(s): PROBNP in the last 8760 hours. HbA1C: No results for input(s): HGBA1C in the last 72 hours. CBG:  Recent Labs Lab  08/08/16 1439  GLUCAP 128*   Lipid Profile: No results for input(s): CHOL, HDL, LDLCALC, TRIG, CHOLHDL, LDLDIRECT in the last 72 hours. Thyroid Function Tests: No results for input(s): TSH, T4TOTAL, FREET4, T3FREE, THYROIDAB in the last 72 hours. Anemia Panel: No results for input(s): VITAMINB12, FOLATE, FERRITIN, TIBC, IRON, RETICCTPCT in the last 72 hours. Urine analysis:    Component Value Date/Time   COLORURINE YELLOW 08/08/2016 2113   APPEARANCEUR CLEAR 08/08/2016 2113   LABSPEC >1.046 (H) 08/08/2016 2113   PHURINE 6.0 08/08/2016 2113   GLUCOSEU NEGATIVE 08/08/2016 2113   GLUCOSEU NEGATIVE 12/09/2014 0949   HGBUR NEGATIVE 08/08/2016 2113   BILIRUBINUR NEGATIVE 08/08/2016 2113   BILIRUBINUR negative 03/21/2016 1519   BILIRUBINUR Negative 11/27/2012 1516   KETONESUR NEGATIVE 08/08/2016 2113   PROTEINUR NEGATIVE 08/08/2016 2113   UROBILINOGEN 0.2 03/21/2016 1519   UROBILINOGEN 0.2 12/09/2014 0949   NITRITE NEGATIVE 08/08/2016 2113   LEUKOCYTESUR NEGATIVE 08/08/2016 2113    Radiological Exams on Admission: No results found.   Assessment/Plan Active Problems:   HYPERTRIGLYCERIDEMIA   Acute diverticulitis   Recurrent acute diverticulitis -We will repeat labs today, obtain a CMP as well as a CBC, if  white count is significantly increased she will need a repeat CT scan of the abdomen and pelvis.  For now obtain a plain film since she is quite tender to palpation.  She has no peritoneal signs. -Place patient on Zosyn per pharmacy -Keep n.p.o. for now,  start IV fluids -GI will see in the morning -Pain controlled with Percocet  Recent C. difficile colitis -Patient was treated to p.o. vancomycin, her diarrhea is completely resolved -Continue Florastor since she will be on Zosyn -Closely monitor for diarrhea will need to be rechecked for C. difficile if she has recurrent episodes  GERD -Continue Gaviscon  Hyperlipidemia -Resume fenofibrate   DVT prophylaxis: Lovenox Code Status: Full code Family Communication: Discussed with husband at bedside Disposition Plan: Admit to [place] called: GI    Admission status: Inpatient  At the time of admission, it appears that the appropriate admission status for this patient is INPATIENT. This is judged to be reasonable and necessary in order to provide the required high service intensity to ensure the patient's safety given the presenting symptoms, physical exam findings, and initial radiographic and laboratory data in the context of their chronic comorbidities. Current circumstances are n.p.o. status, IV fluids, in need of IV antibiotics, and it is felt to place patient at high risk for further clinical deterioration threatening life, limb, or organ. Moreover, it is my clinical judgment that the patient will require inpatient hospital care spanning beyond 2 midnights from the point of admission and that early discharge would result in unnecessary risk of decompensation and readmission or threat to life, limb or bodily function.   Marzetta Board, MD [facility] Pager [phone]  If 7PM-7AM, please contact night-coverage [email or web address] Password [email or web address]  08/11/2016, 5:22 PM

## 2016-08-11 NOTE — Progress Notes (Addendum)
Pharmacy Antibiotic Note  Tracy [name] is a 66 y.o. female admitted on 08/11/2016 with intra-abdominal infection.  Pharmacy has been consulted for zosyn dosing. Recent hospitalization for acute diverticulitis with abscess and microperforation. Recently treated for C. difficile  Today, 08/11/2016  Renal: SCr WNL  WBC elevated   Plan:  Zosyn 3.375gm IV q8h over 4h infusion  Height: 5\' 4"  (162.6 cm) Weight: 161 lb (73 kg) (Per Pt) IBW/kg (Calculated) : 54.7  Temp (24hrs), Avg:98.3 F (36.8 C), Min:98.3 F (36.8 C), Max:98.3 F (36.8 C)   Recent Labs Lab 08/08/16 1749 08/09/16 1539  WBC 15.0* 14.9*  CREATININE 1.00 0.83    Estimated Creatinine Clearance: 63.5 mL/min (by C-G formula based on SCr of 0.83 mg/dL).    Allergies  Allergen Reactions  . Demerol [Meperidine] Nausea And Vomiting  . Vicodin [Hydrocodone-Acetaminophen] Other (See Comments)    hallucinations  . Latex Rash and Other (See Comments)    Reaction:  Blisters     Antimicrobials this admission: 6/14 pip/tazo >>  Dose adjustments this admission:   Microbiology results: 5/17 C. Diff Ag: positive, toxin: negative 5/17 C. Diff PCR: positive  Thank you for allowing pharmacy to be a part of this patient's care.  Doreene Eland, PharmD, BCPS.   Pager: [phone] 08/11/2016 5:40 PM

## 2016-08-11 NOTE — Telephone Encounter (Signed)
Patient in for an office visit.

## 2016-08-11 NOTE — Telephone Encounter (Signed)
Called back patient she continues to have persistent fever, severe left lower quadrant abdominal pain despite taking Augmentin for greater than 48 hours. She also hasn't had a bowel movement since she last saw me on June 12. Will bring patient in for urgent office visit to evaluate, may need admission to the hospital for IV antibiotics and also possible reimaging

## 2016-08-11 NOTE — Telephone Encounter (Signed)
Patient feels "the same" as she did when seen on 08/09/16. She continues to have fevers, but mostly in the evening. She is taking the "pain medicine as often as it is allowed" or she is "in terrible pain" "cannot bear it". Confirmed BID dosing of Augmentin. Taking in fluids. Not hungry. She has had a soft formed bowel movement. Bearing down is painful, but not the passing of stool or urine. Please advise.

## 2016-08-11 NOTE — Progress Notes (Signed)
Tracy [name]    [id number]    1950-11-14  Primary Care Physician:Tabori, Aundra Millet, MD  Referring Physician: [name], MD [name] [place], [place] [address]  Chief complaint:  LLQ abd pain, fever, lethargy HPI:  66 year old female with history of severe sigmoid diverticulosis with recurrent diverticulitis. She was recently hospitalized in May for complicated diverticulitis with microperforation and abscess. Patient was in the ER 08/08/2016 CT abdomen and pelvis showed showed focal area of pericolonic edema and inflammation in the proximal sigmoid segment with no perforation or abscess. She was discharged home on amoxicillin 500 mg 3 times a day. She saw me in office on 08/09/2016 with persistent left lower quadrant abdominal pain, she did not have any significant improvement. I switched her to Augmentin 875 mg twice daily. Repeat CBC showed persistently elevated white count at 15. BUN/creatinine within normal limits. The patient called this afternoon with complaints of persistent fevers despite taking Percocet every 6 hours and severe left lower quadrant pain radiating to the suprapubic area. She is also having discomfort when she urinates. She hasn't been taking much by mouth intake. She is feeling very weak, dizzy and lethargic. She has been on oral antibiotics for almost 3 days now.   Outpatient Encounter Prescriptions as of 08/11/2016  Medication Sig  . amoxicillin-clavulanate (AUGMENTIN) 875-125 MG tablet Take 1 tablet by mouth 2 (two) times daily.  . Calcium Carbonate-Vitamin D (CALCIUM 600+D) 600-400 MG-UNIT tablet Take 2 tablets by mouth 2 (two) times daily.  . cholecalciferol (VITAMIN D) 1000 units tablet Take 1,000 Units by mouth daily.  Marland Kitchen ENSURE (ENSURE) Take 237 mLs by mouth.  . famotidine (PEPCID) 20 MG tablet Take 1 tablet (20 mg total) by mouth 2 (two) times daily.  . fenofibrate 160 MG tablet Take 1 tablet (160 mg total) by mouth daily.   . Melatonin 5 MG TABS Take 5 mg by mouth at bedtime as needed (for sleep).  Marland Kitchen oxyCODONE-acetaminophen (PERCOCET) 5-325 MG tablet Take 1-2 tablets by mouth every 6 (six) hours as needed.  . saccharomyces boulardii (FLORASTOR) 250 MG capsule Take 1 capsule (250 mg total) by mouth 2 (two) times daily.  . vitamin B-12 (CYANOCOBALAMIN) 1000 MCG tablet Take 1,000 mcg by mouth daily.  . [DISCONTINUED] amoxicillin-clavulanate (AUGMENTIN) 500-125 MG tablet Take 1 tablet (500 mg total) by mouth every 8 (eight) hours.  . [DISCONTINUED] feeding supplement (BOOST / RESOURCE BREEZE) LIQD Take 1 Container by mouth 3 (three) times daily between meals.   No facility-administered encounter medications on file as of 08/11/2016.     Allergies as of 08/11/2016 - Review Complete 08/11/2016  Allergen Reaction Noted  . Demerol [meperidine] Nausea And Vomiting 12/13/2013  . Vicodin [hydrocodone-acetaminophen] Other (See Comments) 07/27/2016  . Latex Rash and Other (See Comments) 07/28/2015    Past Medical History:  Diagnosis Date  . Anxiety   . Arthritis   . Bursitis of left hip   . C. difficile diarrhea   . Cancer (HCC)    basil cell  . Complication of anesthesia   . Diverticulitis   . GERD (gastroesophageal reflux disease)   . Hx: UTI (urinary tract infection)   . Hyperlipidemia   . Hyperthyroidism   . Osteopenia   . PONV (postoperative nausea and vomiting)   . Rosacea     Past Surgical History:  Procedure Laterality Date  . APPENDECTOMY    . BACK SURGERY    .  CHOLECYSTECTOMY    . FOOT SURGERY Right    X 2  . KNEE SURGERY    . LAPAROSCOPIC LIVER CYST FENESTRATION    . OPEN SURGICAL REPAIR OF GLUTEAL TENDON Left 04/01/2014   Procedure: LEFT HIP BURSECTOMY WITH GLUTEAL TENDON REPAIR;  Surgeon: Gearlean Alf, MD;  Location: [facility];  Service: Orthopedics;  Laterality: Left;  . TONSILLECTOMY    . TOTAL KNEE ARTHROPLASTY Right 08/17/2015   Procedure: RIGHT TOTAL KNEE ARTHROPLASTY;  Surgeon: Gaynelle Arabian, MD;  Location: [facility];  Service: Orthopedics;  Laterality: Right;  . TUBAL LIGATION      Family History  Problem Relation Age of Onset  . Hypertension Mother   . Stroke Mother   . Liver disease Father        alcohol related  . Bone cancer Paternal Grandfather   . Cancer Neg Hx   . Diabetes Neg Hx   . Colon cancer Neg Hx   . Esophageal cancer Neg Hx   . Pancreatic cancer Neg Hx   . Stomach cancer Neg Hx     Social History   Social History  . Marital status: Married    Spouse name: N/A  . Number of children: N/A  . Years of education: N/A   Occupational History  . Not on file.   Social History Main Topics  . Smoking status: Never Smoker  . Smokeless tobacco: Never Used  . Alcohol use No  . Drug use: No  . Sexual activity: Not on file   Other Topics Concern  . Not on file   Social History Narrative  . No narrative on file      Review of systems: Review of Systems  ConstitutionalPositiveor fever and chills.  positive for lack of energy  HENTPositive for runny nose Eyes: Negative for blurred vision.  Respiratory: Negative for cough, shortness of breath and wheezing.   Cardiovascular: Negative for chest pain and palpitations.  Gastrointestinal: as per HPI Genitourinary: Positive for dysuria, urgency,  negative for frequency and hematuria.  Musculoskeletal: Positive for myalgias, back pain and joint pain.  Skin: Negative for itching and rash.  Neurological: Negative for tremors, focal weakness, seizures and loss of consciousness.  positive for headache and dizziness  Endo/Heme/Allergies: Positive for seasonal allergies.  Psychiatric/Behavioral: Negative for depression, suicidal ideas and hallucinations.  All other systems reviewed and are negative.   Physical Exam: Vitals:   08/11/16 1501  BP: 108/68  Pulse: 74   Body mass index is 27.64 kg/m. Gen:      Mild distress , appears lethargic  HEENT:  EOMI, sclera anicteric, sunken eyes  Neck:      No masses; no thyromegaly Lungs:    Clear to auscultation bilaterally; normal respiratory effort CV:         Regular rate and rhythm; no murmurs Abd:      Hypo active bowel  sounds; soft, left lower quadrant and suprapubic tenderness; no palpable masses, no distension Ext:    No edema; adequate peripheral perfusion Skin:      Warm and dry; no rash Neuro: alert and oriented x 3 Psych: normal mood and affect  Data Reviewed:  Reviewed labs, radiology imaging, old records and pertinent past GI work up   Assessment and Plan/Recommendations:  66 year old female with history of severe sigmoid diverticulosis, recent hospitalization with complicated diverticulitis, microperforation and abscess that has since resolved, now with recurrent episode of diverticulitis . She continues to have persistent low-grade fever despite taking Tylenol  and oxycodone every 6 hours and oral antibiotics for past 3 days  No evidence of acute abdomen but has LLQ and supra pubic tenderness  Patient will need inpatient admission and IV antibiotics Labs NPO IV fluids May also need repeat imaging if continues to have persistent symptoms  Patient had C. difficile colitis during her last admission, continue probiotic Florastor  Discussed with Dr Fredrik Cove  for direct admission as patient is currently hemodynamically stable, he agreed with it  Also informed inpatient GI team   25 minutes was spent face-to-face with the patient. Greater than 50% of the time used for counseling as well as treatment plan and follow-up acute diverticulitis . She had multiple questions which were answered to her satisfaction  K. Denzil Magnuson , MD [phone] Mon-Fri 8a-5p [phone] after 5p, weekends, holidays  CC: [name], MD

## 2016-08-12 DIAGNOSIS — K5732 Diverticulitis of large intestine without perforation or abscess without bleeding: Principal | ICD-10-CM

## 2016-08-12 DIAGNOSIS — R4589 Other symptoms and signs involving emotional state: Secondary | ICD-10-CM

## 2016-08-12 DIAGNOSIS — F418 Other specified anxiety disorders: Secondary | ICD-10-CM

## 2016-08-12 DIAGNOSIS — E440 Moderate protein-calorie malnutrition: Secondary | ICD-10-CM | POA: Insufficient documentation

## 2016-08-12 LAB — CBC
HCT: 36.9 % (ref 36.0–46.0)
Hemoglobin: 12.7 g/dL (ref 12.0–15.0)
MCH: 30.5 pg (ref 26.0–34.0)
MCHC: 34.4 g/dL (ref 30.0–36.0)
MCV: 88.7 fL (ref 78.0–100.0)
Platelets: 234 10*3/uL (ref 150–400)
RBC: 4.16 MIL/uL (ref 3.87–5.11)
RDW: 13.9 % (ref 11.5–15.5)
WBC: 7 10*3/uL (ref 4.0–10.5)

## 2016-08-12 LAB — BASIC METABOLIC PANEL
ANION GAP: 8 (ref 5–15)
BUN: 13 mg/dL (ref 6–20)
CHLORIDE: 105 mmol/L (ref 101–111)
CO2: 27 mmol/L (ref 22–32)
Calcium: 9.3 mg/dL (ref 8.9–10.3)
Creatinine, Ser: 0.81 mg/dL (ref 0.44–1.00)
GFR calc Af Amer: >60 mL/min (ref >60)
GFR calc non Af Amer: >60 mL/min (ref >60)
GLUCOSE: 92 mg/dL (ref 65–99)
POTASSIUM: 4.4 mmol/L (ref 3.5–5.1)
Sodium: 140 mmol/L (ref 135–145)

## 2016-08-12 MED ORDER — OXYCODONE-ACETAMINOPHEN 5-325 MG PO TABS: 1.0000 | ORAL_TABLET | Freq: Once | ORAL | Status: DC

## 2016-08-12 MED ORDER — ENSURE ENLIVE PO LIQD
237.0000 mL | Freq: Two times a day (BID) | ORAL | Status: DC
  Administered 2016-08-13 – 2016-08-15 (×5): 237 mL via ORAL

## 2016-08-12 MED ORDER — POLYETHYLENE GLYCOL 3350 17 G PO PACK
17.0000 g | PACK | Freq: Every day | ORAL | Status: DC
  Administered 2016-08-12: 17 g via ORAL
  Filled 2016-08-12: qty 1

## 2016-08-12 MED ORDER — FLUTICASONE PROPIONATE 50 MCG/ACT NA SUSP
1.0000 | Freq: Every day | NASAL | Status: DC
  Administered 2016-08-12: 1 via NASAL
  Filled 2016-08-12: qty 16

## 2016-08-12 MED ORDER — SENNOSIDES-DOCUSATE SODIUM 8.6-50 MG PO TABS
1.0000 | ORAL_TABLET | Freq: Every day | ORAL | Status: DC
Start: 2016-08-12 — End: 2016-08-15
  Administered 2016-08-12 – 2016-08-14 (×3): 1 via ORAL
  Filled 2016-08-12 (×3): qty 1

## 2016-08-12 MED ORDER — FENOFIBRATE 160 MG PO TABS
160.0000 mg | ORAL_TABLET | Freq: Every day | ORAL | Status: DC
  Administered 2016-08-12 – 2016-08-15 (×4): 160 mg via ORAL
  Filled 2016-08-12 (×4): qty 1

## 2016-08-12 NOTE — Care Management Note (Signed)
Case Management Note  Patient Details  Name: Tracy [name] MRN: [id number] Date of Birth: 21-May-1950  Subjective/Objective:                   66 y.o. female with medical history significant of hyperlipidemia, thyroid nodules, comes to the hospital from gastroenterology office due to abdominal pain. Patient was recently hospitalized and discharged about 3 weeks ago with acute diverticulitis with abscess and microperforations.  She was slow to respond, and she has not been in the hospital for 12 days.  General surgery was consulted and have follow patient while hospitalized.  She eventually improved, and she was discharged on p.o. Augmentin.  Her hospital course was complicated by diarrhea which turned out to be positive for C. difficile.  She was treated with p.o. vancomycin.  She felt when she went home, and actually felt good that week and was out on vacation.  Startin 3 days ago on Monday, patient has been started to have recurrent left lower quadrant abdominal pain with intermittent fevers in the evening.  She went to the emergency room on 6/11 she was placed on amoxicillin.  She saw her gastroenterologist Dr. Silverio Decamp On 6/12 she was placed on Augmentin with plans for 14 days.  Patient continued to have worsening abdominal pain, as well as fevers every evening, and decided to see her GI doctor again today.  She was direct admitted for IV antibiotics.  Patient currently denies any diarrhea, she denies any nausea or vomiting.  She has no chest pain or shortness of breath.  She denies palpitations.  Other than abdominal pain, has no other complaints  Action/Plan: Date:  August 12, 2016  Chart reviewed for concurrent status and case management needs.  Will continue to follow patient progress.  Discharge Planning: following for needs  Expected discharge date: [id number]  Velva Harman, BSN, [place], [place]   Expected Discharge Date:   (unknown)               Expected Discharge Plan:   Home/Self Care  In-House Referral:     Discharge planning Services  CM Consult  Post Acute Care Choice:    Choice offered to:     DME Arranged:    DME Agency:     HH Arranged:    [place] Agency:     Status of Service:  In process, will continue to follow  If discussed at Long Length of Stay Meetings, dates discussed:    Additional Comments:  Leeroy Cha, RN 08/12/2016, 9:09 AM

## 2016-08-12 NOTE — Progress Notes (Signed)
Initial Nutrition Assessment  DOCUMENTATION CODES:   Non-severe (moderate) malnutrition in context of acute illness/injury  INTERVENTION:   Ensure Enlive po BID, each supplement provides 350 kcal and 20 grams of protein  NUTRITION DIAGNOSIS:   Malnutrition (moderate) related to acute illness (multiple severe diverticulitis flares with abscess) as evidenced by moderate depletion of body fat, moderate depletions of muscle mass, 7 percent weight loss I month.  GOAL:   Patient will meet greater than or equal to 90% of their needs  MONITOR:   PO intake, Supplement acceptance, Diet advancement, Labs, Weight trends  REASON FOR ASSESSMENT:   Malnutrition Screening Tool    ASSESSMENT:   66 y.o. female with medical history significant of hyperlipidemia, thyroid nodules, comes to the hospital from gastroenterology office due to abdominal pain. Patient was recently hospitalized and discharged about 3 weeks ago with acute diverticulitis with abscess and microperforations and C Diff.  Pt presents today with complaint of returned abdominal pain  Met with pt in room today. RD familiar with this pt from previous admissions. Pt recently admitted for diverticulitis with abscess (May 2018). Pt reports that she was doing well upon discharge, eating well, and starting to incorporate more foods into her diet. [place] couple of weeks. Pt then reports that she started to have abdominal pain and nausea again. Pt has not been eating well for the past week. Per chart, pt has lost 13lbs(7%) in 1 month; this is significant. Pt started on clear liquid diet today and drinking broth at time of RD visit. Pt to advance diet slowly. Pt has been drinking some Ensure at home and would like to have Ensure in hospital. RD will order for when diet advances. Pt was provided with diverticulitis education today. Plan is for pt to repeat CT scan if no improvement.     Medications reviewed and include: lovenox, florastor, zosyn, oxycodone  Labs reviewed  Nutrition-Focused physical exam completed. Findings are moderate fat depletion in orbital regions, chest, moderate muscle depletion in hands, clavicles, and BLE, and no edema.   Diet Order:  Diet clear liquid Room service appropriate? Yes; Fluid consistency: Thin  Skin:  Reviewed, no issues  Last BM:  6/14  Height:   Ht Readings from Last 1 Encounters:  08/11/16 _0  (1.626 m)    Weight:   Wt Readings from Last 1 Encounters:  08/11/16 161 lb (73 kg)    Ideal Body Weight:  54.5 kg  BMI:  Body mass index is 27.64 kg/m.  Estimated Nutritional Needs:   Kcal:  1500-1800kcal/day   Protein:  73-88g/day   Fluid:  >1.5L/day   EDUCATION NEEDS:   Education needs addressed  Koleen Distance MS, RD, LDN Pager #[phone]

## 2016-08-12 NOTE — Progress Notes (Signed)
PROGRESS NOTE  Tracy [name]:[id number] DOB: Aug 02, 1950 DOA: 08/11/2016 PCP: [name], MD  HPI/Recap of past 24 hours:  Persistent left lower quadrant pain, no appetite,  Though no fever,  Husband at bedside  Assessment/Plan: Active Problems:   HYPERTRIGLYCERIDEMIA   Acute diverticulitis  Recurrent acute diverticulitis -she has been suffering from this for more than a months now, she was treated in the hospital for this from 5/8to 5/20. -she is started on Zosyn per pharmacy since admission which will continue -continue iV fluids --Pain controlled with Percocet --diet advancement per gi, [facility] following, input appreciated  Recent C. difficile colitis -Patient was treated to p.o. vancomycin, her diarrhea is completely resolved -Continue Florastor since she will be on Zosyn -Closely monitor for diarrhea will need to be rechecked for C. difficile if she has recurrent episodes  Constipation: ab x ray with moderate stool in colon, start stool regimen  GERD -Continue Gaviscon  Hyperlipidemia -Resume fenofibrate   DVT prophylaxis: Lovenox Code Status: Full code Family Communication: Discussed with husband at bedside Disposition Plan: not medically ready for discharge yet Consults called: GI   Procedures:  none  Antibiotics:  zosyn   Objective: BP 125/66 (BP Location: Right Arm)   Pulse 72   Temp 98.1 F (36.7 C) (Oral)   Resp 20   Ht 5\' 4"  (1.626 m)   Wt 73 kg (161 lb) Comment: Per Pt  SpO2 100%   BMI 27.64 kg/m   Intake/Output Summary (Last 24 hours) at 08/12/16 1657 Last data filed at 08/12/16 1200  Gross per 24 hour  Intake           1387.5 ml  Output                0 ml  Net           1387.5 ml   Filed Weights   08/11/16 1647  Weight: 73 kg (161 lb)    Exam:   General:  Weak, does not look comfortable  Cardiovascular: RRR  Respiratory: CTABL  Abdomen: tender left lower quadrant with slight guarding, no rebound,   positive BS  Musculoskeletal: No Edema  Neuro: aaox3  Data Reviewed: Basic Metabolic Panel:  Recent Labs Lab 08/08/16 1749 08/09/16 1539 08/11/16 1817 08/12/16 0624  NA 141 141 138 140  K 4.0 4.0 3.9 4.4  CL 106 107 105 105  CO2 27 28 27 27   GLUCOSE 139* 121* 101* 92  BUN 23* 20 12 13   CREATININE 1.00 0.83 0.76 0.81  CALCIUM 10.4* 9.9 9.1 9.3   Liver Function Tests:  Recent Labs Lab 08/08/16 1749 08/11/16 1817  AST 32 18  ALT 24 15  ALKPHOS 69 76  BILITOT 1.0 0.4  PROT 7.8 6.9  ALBUMIN 4.6 3.5   No results for input(s): LIPASE, AMYLASE in the last 168 hours. No results for input(s): AMMONIA in the last 168 hours. CBC:  Recent Labs Lab 08/08/16 1749 08/09/16 1539 08/11/16 1817 08/12/16 0624  WBC 15.0* 14.9* 7.8 7.0  NEUTROABS  --  13.0* 5.8  --   HGB 13.9 12.8 11.8* 12.7  HCT 41.0 38.2 34.5* 36.9  MCV 88.7 89.1 87.3 88.7  PLT 190 186.0 201 234   Cardiac Enzymes:   No results for input(s): CKTOTAL, CKMB, CKMBINDEX, TROPONINI in the last 168 hours. BNP (last 3 results) No results for input(s): BNP in the last 8760 hours.  ProBNP (last 3 results) No results for input(s): PROBNP in the last  8760 hours.  CBG:  Recent Labs Lab 08/08/16 1439  GLUCAP 128*    No results found for this or any previous visit (from the past 240 hour(s)).   Studies: Abd 1 View (kub)  Result Date: 08/11/2016 CLINICAL DATA:  History of diverticulitis with right lower quadrant abdominal pain EXAM: ABDOMEN - 1 VIEW COMPARISON:  08/08/2016 FINDINGS: Surgical clips in the right upper quadrant. Overall nonobstructed bowel gas pattern with moderate stool in the colon. No abnormal calcification. IMPRESSION: Nonobstructed bowel-gas pattern Electronically Signed   By: Donavan Foil M.D.   On: 08/11/2016 19:31    Scheduled Meds: . enoxaparin (LOVENOX) injection  40 mg Subcutaneous Q24H  . feeding supplement (ENSURE ENLIVE)  237 mL Oral BID BM  . polyethylene glycol  17 g Oral  Daily  . saccharomyces boulardii  250 mg Oral BID  . senna-docusate  1 tablet Oral QHS    Continuous Infusions: . sodium chloride 75 mL/hr at 08/11/16 1746  . piperacillin-tazobactam (ZOSYN)  IV 3.375 g (08/12/16 1342)     Time spent: 75mins  Elley Harp MD, PhD  [facility] Pager [phone]. If 7PM-7AM, please contact night-coverage at [email or web address], password [facility] 08/12/2016, 4:57 PM  LOS: 1 day

## 2016-08-12 NOTE — Progress Notes (Signed)
[facility] Progress Note  Chief Complaint:    Abdominal pain  Subjective: Feels better today. Afebrile, normal WBC. She wants some broth  Objective:  Vital signs in last 24 hours: Temp:  [97.9 F (36.6 C)-99 F (37.2 C)] 97.9 F (36.6 C) (06/15 0932) Pulse Rate:  [74-78] 75 (06/15 0632) Resp:  [18] 18 (06/14 2111) BP: (108-143)/(64-78) 143/78 (06/15 0632) SpO2:  [100 %] 100 % (06/15 3557) Weight:  [161 lb (73 kg)] 161 lb (73 kg) (06/14 1647) Last BM Date: 08/11/16 General:   Alert, well-developed, white female  in NAD EENT:  Normal hearing, non icteric sclera, conjunctive pink.  Heart:  Regular rate and rhythm, no lower extremity edema Pulm: Normal respiratory effort Abdomen:  Soft, nondistended, mod LLQ tenderness. Normal bowel sounds, no masses felt.    Neurologic:  Alert and  oriented x4;  grossly normal neurologically. Psych:  Pleasant, cooperative.  Normal mood and affect.  Lab Results:  Recent Labs  08/09/16 1539 08/11/16 1817 08/12/16 0624  WBC 14.9* 7.8 7.0  HGB 12.8 11.8* 12.7  HCT 38.2 34.5* 36.9  PLT 186.0 201 234   BMET  Recent Labs  08/09/16 1539 08/11/16 1817 08/12/16 0624  NA 141 138 140  K 4.0 3.9 4.4  CL 107 105 105  CO2 28 27 27   GLUCOSE 121* 101* 92  BUN 20 12 13   CREATININE 0.83 0.76 0.81  CALCIUM 9.9 9.1 9.3   LFT  Recent Labs  08/11/16 1817  PROT 6.9  ALBUMIN 3.5  AST 18  ALT 15  ALKPHOS 76  BILITOT 0.4   Abd 1 View (kub)  Result Date: 08/11/2016 CLINICAL DATA:  History of diverticulitis with right lower quadrant abdominal pain EXAM: ABDOMEN - 1 VIEW COMPARISON:  08/08/2016 FINDINGS: Surgical clips in the right upper quadrant. Overall nonobstructed bowel gas pattern with moderate stool in the colon. No abnormal calcification. IMPRESSION: Nonobstructed bowel-gas pattern Electronically Signed   By: Donavan Foil M.D.   On: 08/11/2016 19:31    ASSESSMENT / PLAN:   Sigmoid diverticulitis: IMPROVING 66  yo female with sigmoid sigmoid diverticulitis complicated by small abscess and small contained microperforation on CTscan mid May. Persistent pain despite two rounds of antibiotics (Amoxicillin , Augmentin). Repeat CT scan 4 days ago showed Improvement in inflammation and resolution of abscess and microperforation. Seen in office yesterdays with fevers and persistent LLQ pain. Generally felt poor with weakness / lethargy.  -continue IV antibiotics.  -tolerating ice chips. Wants broth. Will give clears -If recurrent symptoms or doesn't continue to improve will repeat CTscan     LOS: 1 day   Tye Savoy ,NP 08/12/2016, 9:42 AM  Pager number [phone]      GI Attending   I have taken an interval history, reviewed the chart and examined the patient. I agree with the Advanced Practitioner's note, impression and recommendations.    She is improved overall. She is very fearful of eating thinking that it might harm her. I think based upon my discussion with her and her husband that is part of the problem. I really don't think she will need another CT scan. I think 1 or 2 more days of IV antibiotics with transition to 2 weeks of oral antibiotics different than what she was on before make sense. So I would do cipro and metronidazole   Dr. Benson Norway will check her tomorrow.  Gatha Mayer, MD, [facility] [phone] (pager) [phone] after 5 PM, weekends and holidays  08/12/2016 7:14 PM

## 2016-08-13 DIAGNOSIS — K5792 Diverticulitis of intestine, part unspecified, without perforation or abscess without bleeding: Secondary | ICD-10-CM

## 2016-08-13 LAB — CBC WITH DIFFERENTIAL/PLATELET
BASOS ABS: 0 10*3/uL (ref 0.0–0.1)
BASOS PCT: 0 %
EOS ABS: 0.3 10*3/uL (ref 0.0–0.7)
EOS PCT: 5 %
HCT: 35.9 % — ABNORMAL LOW (ref 36.0–46.0)
Hemoglobin: 12.1 g/dL (ref 12.0–15.0)
LYMPHS ABS: 1.1 10*3/uL (ref 0.7–4.0)
Lymphocytes Relative: 14 %
MCH: 29.7 pg (ref 26.0–34.0)
MCHC: 33.7 g/dL (ref 30.0–36.0)
MCV: 88 fL (ref 78.0–100.0)
Monocytes Absolute: 0.8 10*3/uL (ref 0.1–1.0)
Monocytes Relative: 11 %
NEUTROS PCT: 70 %
Neutro Abs: 5.2 10*3/uL (ref 1.7–7.7)
PLATELETS: 260 10*3/uL (ref 150–400)
RBC: 4.08 MIL/uL (ref 3.87–5.11)
RDW: 13.8 % (ref 11.5–15.5)
WBC: 7.4 10*3/uL (ref 4.0–10.5)

## 2016-08-13 LAB — MAGNESIUM: MAGNESIUM: 1.8 mg/dL (ref 1.7–2.4)

## 2016-08-13 LAB — BASIC METABOLIC PANEL
ANION GAP: 6 (ref 5–15)
BUN: 11 mg/dL (ref 6–20)
CHLORIDE: 105 mmol/L (ref 101–111)
CO2: 29 mmol/L (ref 22–32)
Calcium: 9.5 mg/dL (ref 8.9–10.3)
Creatinine, Ser: 0.78 mg/dL (ref 0.44–1.00)
Glucose, Bld: 99 mg/dL (ref 65–99)
POTASSIUM: 4.8 mmol/L (ref 3.5–5.1)
SODIUM: 140 mmol/L (ref 135–145)

## 2016-08-13 MED ORDER — POLYETHYLENE GLYCOL 3350 17 G PO PACK
17.0000 g | PACK | Freq: Two times a day (BID) | ORAL | Status: DC
  Administered 2016-08-13 – 2016-08-14 (×3): 17 g via ORAL
  Filled 2016-08-13 (×4): qty 1

## 2016-08-13 MED ORDER — FLUTICASONE PROPIONATE 50 MCG/ACT NA SUSP
1.0000 | Freq: Every day | NASAL | Status: DC
  Administered 2016-08-13 – 2016-08-14 (×2): 1 via NASAL
  Filled 2016-08-13: qty 16

## 2016-08-13 NOTE — Progress Notes (Signed)
Subjective: Still with significant LLQ pain.  Objective: Vital signs in last 24 hours: Temp:  [97.7 F (36.5 C)-99 F (37.2 C)] 97.7 F (36.5 C) (06/16 0513) Pulse Rate:  [72-81] 77 (06/16 0513) Resp:  [19-20] 19 (06/16 0513) BP: (125-144)/(66-92) 142/92 (06/16 0513) SpO2:  [99 %-100 %] 99 % (06/16 0513) Last BM Date: 08/11/16  Intake/Output from previous day: 06/15 0701 - 06/16 0700 In: 2536.3 [P.O.:340; I.V.:1996.3; IV Piggyback:200] Out: -  Intake/Output this shift: No intake/output data recorded.  General appearance: alert and no distress GI: tender in the LLQ  Lab Results:  Recent Labs  08/11/16 1817 08/12/16 0624 08/13/16 0516  WBC 7.8 7.0 7.4  HGB 11.8* 12.7 12.1  HCT 34.5* 36.9 35.9*  PLT 201 234 260   BMET  Recent Labs  08/11/16 1817 08/12/16 0624 08/13/16 0516  NA 138 140 140  K 3.9 4.4 4.8  CL 105 105 105  CO2 27 27 29   GLUCOSE 101* 92 99  BUN 12 13 11   CREATININE 0.76 0.81 0.78  CALCIUM 9.1 9.3 9.5   LFT  Recent Labs  08/11/16 1817  PROT 6.9  ALBUMIN 3.5  AST 18  ALT 15  ALKPHOS 76  BILITOT 0.4   PT/INR No results for input(s): LABPROT, INR in the last 72 hours. Hepatitis Panel No results for input(s): HEPBSAG, HCVAB, HEPAIGM, HEPBIGM in the last 72 hours. C-Diff No results for input(s): CDIFFTOX in the last 72 hours. Fecal Lactopherrin No results for input(s): FECLLACTOFRN in the last 72 hours.  Studies/Results: Abd 1 View (kub)  Result Date: 08/11/2016 CLINICAL DATA:  History of diverticulitis with right lower quadrant abdominal pain EXAM: ABDOMEN - 1 VIEW COMPARISON:  08/08/2016 FINDINGS: Surgical clips in the right upper quadrant. Overall nonobstructed bowel gas pattern with moderate stool in the colon. No abnormal calcification. IMPRESSION: Nonobstructed bowel-gas pattern Electronically Signed   By: Donavan Foil M.D.   On: 08/11/2016 19:31    Medications:  Scheduled: . enoxaparin (LOVENOX) injection  40 mg  Subcutaneous Q24H  . feeding supplement (ENSURE ENLIVE)  237 mL Oral BID BM  . fenofibrate  160 mg Oral Daily  . fluticasone  1 spray Each Nare QHS  . polyethylene glycol  17 g Oral BID  . saccharomyces boulardii  250 mg Oral BID  . senna-docusate  1 tablet Oral QHS   Continuous: . sodium chloride 75 mL/hr at 08/13/16 0118  . piperacillin-tazobactam (ZOSYN)  IV 3.375 g (08/13/16 0631)    Assessment/Plan: 1) Sigmoid colon diverticulitis. 2) Persistent LLQ pain. 3) Constipation.   CT scan demonstrates improvement.  There is less inflammation, but she is still symptomatic.  She may be a slower responder.  Plan: 1) Continue with antibiotics. 2) Continue with pain medications. 3) Increase Miralax to BID.  LOS: 2 days   Tracy [name] D 08/13/2016, 9:21 AM

## 2016-08-13 NOTE — Progress Notes (Signed)
PROGRESS NOTE  Tracy [name] OIB:[id number] DOB: 14-Oct-1950 DOA: 08/11/2016 PCP: [name], MD  HPI/Recap of past 24 hours:  Persistent left lower quadrant pain, no appetite,  Though no fever, no vomiting She is constipated   Assessment/Plan: Active Problems:   HYPERTRIGLYCERIDEMIA   Acute diverticulitis   Malnutrition of moderate degree   Anxiety about health  Recurrent acute diverticulitis -she has been suffering from this for more than a months now, she was treated in the hospital for this from 5/8to 5/20. -she is started on Zosyn per pharmacy since admission which will continue -continue iV fluids --Pain controlled with Percocet --diet advancement per gi, LBGI following, input appreciated  Recent C. difficile colitis -Patient was treated to p.o. vancomycin, her diarrhea is completely resolved -Continue Florastor since she will be on Zosyn -Closely monitor for diarrhea will need to be rechecked for C. difficile if she has recurrent episodes  Constipation: ab x ray with moderate stool in colon, start stool regimen  GERD -Continue Gaviscon  Hyperlipidemia -Resume fenofibrate   DVT prophylaxis: Lovenox Code Status: Full code Family Communication: patient Disposition Plan: not medically ready for discharge yet Consults called: GI   Procedures:  none  Antibiotics:  zosyn   Objective: BP (!) 142/92 (BP Location: Right Arm)   Pulse 77   Temp 97.7 F (36.5 C) (Oral)   Resp 19   Ht 5\' 4"  (1.626 m)   Wt 73 kg (161 lb) Comment: Per Pt  SpO2 99%   BMI 27.64 kg/m   Intake/Output Summary (Last 24 hours) at 08/13/16 0824 Last data filed at 08/13/16 0631  Gross per 24 hour  Intake          2536.25 ml  Output                0 ml  Net          2536.25 ml   Filed Weights   08/11/16 1647  Weight: 73 kg (161 lb)    Exam:   General:  Weak, does not look comfortable  Cardiovascular: RRR  Respiratory: CTABL  Abdomen: tender left  lower quadrant with slight guarding, no rebound,  positive BS  Musculoskeletal: No Edema  Neuro: aaox3  Data Reviewed: Basic Metabolic Panel:  Recent Labs Lab 08/08/16 1749 08/09/16 1539 08/11/16 1817 08/12/16 0624 08/13/16 0516  NA 141 141 138 140 140  K 4.0 4.0 3.9 4.4 4.8  CL 106 107 105 105 105  CO2 27 28 27 27 29   GLUCOSE 139* 121* 101* 92 99  BUN 23* 20 12 13 11   CREATININE 1.00 0.83 0.76 0.81 0.78  CALCIUM 10.4* 9.9 9.1 9.3 9.5  MG  --   --   --   --  1.8   Liver Function Tests:  Recent Labs Lab 08/08/16 1749 08/11/16 1817  AST 32 18  ALT 24 15  ALKPHOS 69 76  BILITOT 1.0 0.4  PROT 7.8 6.9  ALBUMIN 4.6 3.5   No results for input(s): LIPASE, AMYLASE in the last 168 hours. No results for input(s): AMMONIA in the last 168 hours. CBC:  Recent Labs Lab 08/08/16 1749 08/09/16 1539 08/11/16 1817 08/12/16 0624 08/13/16 0516  WBC 15.0* 14.9* 7.8 7.0 7.4  NEUTROABS  --  13.0* 5.8  --  5.2  HGB 13.9 12.8 11.8* 12.7 12.1  HCT 41.0 38.2 34.5* 36.9 35.9*  MCV 88.7 89.1 87.3 88.7 88.0  PLT 190 186.0 201 234 260   Cardiac Enzymes:  No results for input(s): CKTOTAL, CKMB, CKMBINDEX, TROPONINI in the last 168 hours. BNP (last 3 results) No results for input(s): BNP in the last 8760 hours.  ProBNP (last 3 results) No results for input(s): PROBNP in the last 8760 hours.  CBG:  Recent Labs Lab 08/08/16 1439  GLUCAP 128*    No results found for this or any previous visit (from the past 240 hour(s)).   Studies: No results found.  Scheduled Meds: . enoxaparin (LOVENOX) injection  40 mg Subcutaneous Q24H  . feeding supplement (ENSURE ENLIVE)  237 mL Oral BID BM  . fenofibrate  160 mg Oral Daily  . fluticasone  1 spray Each Nare Daily  . polyethylene glycol  17 g Oral Daily  . saccharomyces boulardii  250 mg Oral BID  . senna-docusate  1 tablet Oral QHS    Continuous Infusions: . sodium chloride 75 mL/hr at 08/13/16 0118  .  piperacillin-tazobactam (ZOSYN)  IV 3.375 g (08/13/16 0631)     Time spent: 23mins  Jakeira Seeman MD, PhD  [facility] Pager [phone]. If 7PM-7AM, please contact night-coverage at [email or web address], password [facility] 08/13/2016, 8:24 AM  LOS: 2 days

## 2016-08-14 NOTE — Progress Notes (Signed)
Subjective: Feeling better.  She did not require pain medications overnight.  Objective: Vital signs in last 24 hours: Temp:  [97.9 F (36.6 C)-99 F (37.2 C)] 98.9 F (37.2 C) (06/17 0526) Pulse Rate:  [71-88] 88 (06/17 0526) Resp:  [18-20] 18 (06/17 0526) BP: (117-120)/(50-70) 120/61 (06/17 0526) SpO2:  [97 %-98 %] 97 % (06/17 0526) Last BM Date: 08/11/16  Intake/Output from previous day: 06/16 0701 - 06/17 0700 In: 360 [P.O.:360] Out: -  Intake/Output this shift: No intake/output data recorded.  General appearance: alert and no distress GI: improved LLQ tenderness  Lab Results:  Recent Labs  08/11/16 1817 08/12/16 0624 08/13/16 0516  WBC 7.8 7.0 7.4  HGB 11.8* 12.7 12.1  HCT 34.5* 36.9 35.9*  PLT 201 234 260   BMET  Recent Labs  08/11/16 1817 08/12/16 0624 08/13/16 0516  NA 138 140 140  K 3.9 4.4 4.8  CL 105 105 105  CO2 27 27 29   GLUCOSE 101* 92 99  BUN 12 13 11   CREATININE 0.76 0.81 0.78  CALCIUM 9.1 9.3 9.5   LFT  Recent Labs  08/11/16 1817  PROT 6.9  ALBUMIN 3.5  AST 18  ALT 15  ALKPHOS 76  BILITOT 0.4   PT/INR No results for input(s): LABPROT, INR in the last 72 hours. Hepatitis Panel No results for input(s): HEPBSAG, HCVAB, HEPAIGM, HEPBIGM in the last 72 hours. C-Diff No results for input(s): CDIFFTOX in the last 72 hours. Fecal Lactopherrin No results for input(s): FECLLACTOFRN in the last 72 hours.  Studies/Results: No results found.  Medications:  Scheduled: . enoxaparin (LOVENOX) injection  40 mg Subcutaneous Q24H  . feeding supplement (ENSURE ENLIVE)  237 mL Oral BID BM  . fenofibrate  160 mg Oral Daily  . fluticasone  1 spray Each Nare QHS  . polyethylene glycol  17 g Oral BID  . saccharomyces boulardii  250 mg Oral BID  . senna-docusate  1 tablet Oral QHS   Continuous: . sodium chloride 75 mL/hr at 08/13/16 1341  . piperacillin-tazobactam (ZOSYN)  IV 3.375 g (08/14/16 7564)    Assessment/Plan: 1) Sigmoid  diverticulitis. 2) LLQ pain - improving.   Her pain requirement has decreased and she feels that she can eat more.  No recorded fevers.  Plan: 1) Advance to regular diet. 2) PRN pain medications. 3) Continue with antibiotics. 4) ? D/C home tomorrow.  [facility] will resume care.  LOS: 3 days   Nuno Brubacher D 08/14/2016, 7:00 AM

## 2016-08-14 NOTE — Progress Notes (Signed)
PROGRESS NOTE  Tracy [name] MGN:[id number] DOB: 1950-11-05 DOA: 08/11/2016 PCP: [name], MD  HPI/Recap of past 24 hours:  Today she looks better, report less left lower quadrant pain, no fever, no vomiting, + bm Husband at bedside   Assessment/Plan: Active Problems:   HYPERTRIGLYCERIDEMIA   Acute diverticulitis   Malnutrition of moderate degree   Anxiety about health  Recurrent acute diverticulitis -she has been suffering from this for more than a months now, she was treated in the hospital for this from 5/8to 5/20. -she is started on Zosyn per pharmacy since admission which will continue -she is now eating more, gi advanced to regular diet, d/c IV fluids --Pain controlled with Percocet, ambulate ,continue stool softener  --GI following, input appreciated  Constipation: ab x ray with moderate stool in colon, on stool regimen  Recent C. difficile colitis -Patient was treated to p.o. vancomycin, her diarrhea is completely resolved -Continue Florastor since she will be on Zosyn -Closely monitor for diarrhea will need to be rechecked for C. difficile if she has recurrent episodes   GERD -Continue Gaviscon  Hyperlipidemia -Resume fenofibrate   DVT prophylaxis: Lovenox Code Status: Full code Family Communication: patient and husband Disposition Plan: hopefully d/c home on 6/18 with gi clearance Consults called: GI   Procedures:  none  Antibiotics:  zosyn   Objective: BP 127/63 (BP Location: Right Arm)   Pulse 86   Temp 97.5 F (36.4 C) (Oral)   Resp 18   Ht 5\' 4"  (1.626 m)   Wt 73 kg (161 lb) Comment: Per Pt  SpO2 96%   BMI 27.64 kg/m   Intake/Output Summary (Last 24 hours) at 08/14/16 1655 Last data filed at 08/14/16 0900  Gross per 24 hour  Intake              360 ml  Output                0 ml  Net              360 ml   Filed Weights   08/11/16 1647  Weight: 73 kg (161 lb)    Exam:   General:  Looks better,  NAD  Cardiovascular: RRR  Respiratory: CTABL  Abdomen: less tender left lower quadrant with slight guarding, no rebound,  positive BS  Musculoskeletal: No Edema  Neuro: aaox3  Data Reviewed: Basic Metabolic Panel:  Recent Labs Lab 08/08/16 1749 08/09/16 1539 08/11/16 1817 08/12/16 0624 08/13/16 0516  NA 141 141 138 140 140  K 4.0 4.0 3.9 4.4 4.8  CL 106 107 105 105 105  CO2 27 28 27 27 29   GLUCOSE 139* 121* 101* 92 99  BUN 23* 20 12 13 11   CREATININE 1.00 0.83 0.76 0.81 0.78  CALCIUM 10.4* 9.9 9.1 9.3 9.5  MG  --   --   --   --  1.8   Liver Function Tests:  Recent Labs Lab 08/08/16 1749 08/11/16 1817  AST 32 18  ALT 24 15  ALKPHOS 69 76  BILITOT 1.0 0.4  PROT 7.8 6.9  ALBUMIN 4.6 3.5   No results for input(s): LIPASE, AMYLASE in the last 168 hours. No results for input(s): AMMONIA in the last 168 hours. CBC:  Recent Labs Lab 08/08/16 1749 08/09/16 1539 08/11/16 1817 08/12/16 0624 08/13/16 0516  WBC 15.0* 14.9* 7.8 7.0 7.4  NEUTROABS  --  13.0* 5.8  --  5.2  HGB 13.9 12.8 11.8* 12.7 12.1  HCT 41.0 38.2 34.5* 36.9 35.9*  MCV 88.7 89.1 87.3 88.7 88.0  PLT 190 186.0 201 234 260   Cardiac Enzymes:   No results for input(s): CKTOTAL, CKMB, CKMBINDEX, TROPONINI in the last 168 hours. BNP (last 3 results) No results for input(s): BNP in the last 8760 hours.  ProBNP (last 3 results) No results for input(s): PROBNP in the last 8760 hours.  CBG:  Recent Labs Lab 08/08/16 1439  GLUCAP 128*    No results found for this or any previous visit (from the past 240 hour(s)).   Studies: No results found.  Scheduled Meds: . enoxaparin (LOVENOX) injection  40 mg Subcutaneous Q24H  . feeding supplement (ENSURE ENLIVE)  237 mL Oral BID BM  . fenofibrate  160 mg Oral Daily  . fluticasone  1 spray Each Nare QHS  . polyethylene glycol  17 g Oral BID  . saccharomyces boulardii  250 mg Oral BID  . senna-docusate  1 tablet Oral QHS    Continuous  Infusions: . sodium chloride 75 mL/hr at 08/13/16 1341  . piperacillin-tazobactam (ZOSYN)  IV 3.375 g (08/14/16 1422)     Time spent: 43mins  Daviyon Widmayer MD, PhD  [facility] Pager [phone]. If 7PM-7AM, please contact night-coverage at [email or web address], password [facility] 08/14/2016, 4:55 PM  LOS: 3 days

## 2016-08-15 DIAGNOSIS — R1032 Left lower quadrant pain: Secondary | ICD-10-CM

## 2016-08-15 LAB — CBC WITH DIFFERENTIAL/PLATELET
Basophils Absolute: 0.1 10*3/uL (ref 0.0–0.1)
Basophils Relative: 1 %
Eosinophils Absolute: 0.5 10*3/uL (ref 0.0–0.7)
Eosinophils Relative: 6 %
HEMATOCRIT: 35.8 % — AB (ref 36.0–46.0)
HEMOGLOBIN: 11.8 g/dL — AB (ref 12.0–15.0)
LYMPHS ABS: 1.7 10*3/uL (ref 0.7–4.0)
LYMPHS PCT: 24 %
MCH: 28.7 pg (ref 26.0–34.0)
MCHC: 33 g/dL (ref 30.0–36.0)
MCV: 87.1 fL (ref 78.0–100.0)
MONOS PCT: 9 %
Monocytes Absolute: 0.7 10*3/uL (ref 0.1–1.0)
NEUTROS PCT: 60 %
Neutro Abs: 4.2 10*3/uL (ref 1.7–7.7)
Platelets: 323 10*3/uL (ref 150–400)
RBC: 4.11 MIL/uL (ref 3.87–5.11)
RDW: 14.2 % (ref 11.5–15.5)
WBC: 7 10*3/uL (ref 4.0–10.5)

## 2016-08-15 LAB — BASIC METABOLIC PANEL
Anion gap: 6 (ref 5–15)
BUN: 13 mg/dL (ref 6–20)
CHLORIDE: 105 mmol/L (ref 101–111)
CO2: 31 mmol/L (ref 22–32)
Calcium: 9.4 mg/dL (ref 8.9–10.3)
Creatinine, Ser: 0.77 mg/dL (ref 0.44–1.00)
GFR calc Af Amer: >60 mL/min (ref >60)
GFR calc non Af Amer: >60 mL/min (ref >60)
GLUCOSE: 117 mg/dL — AB (ref 65–99)
POTASSIUM: 4 mmol/L (ref 3.5–5.1)
Sodium: 142 mmol/L (ref 135–145)

## 2016-08-15 LAB — MAGNESIUM: MAGNESIUM: 2.2 mg/dL (ref 1.7–2.4)

## 2016-08-15 MED ORDER — SENNOSIDES-DOCUSATE SODIUM 8.6-50 MG PO TABS: 1.0000 | ORAL_TABLET | Freq: Every day | ORAL | 0 refills | Status: DC

## 2016-08-15 MED ORDER — POLYETHYLENE GLYCOL 3350 17 G PO PACK: 17.0000 g | PACK | Freq: Every day | ORAL | 0 refills | Status: DC

## 2016-08-15 MED ORDER — SACCHAROMYCES BOULARDII 250 MG PO CAPS: 250.0000 mg | ORAL_CAPSULE | Freq: Two times a day (BID) | ORAL | 0 refills | Status: DC

## 2016-08-15 MED ORDER — ENSURE ENLIVE PO LIQD: 237.0000 mL | Freq: Two times a day (BID) | ORAL | 12 refills | Status: DC

## 2016-08-15 NOTE — Progress Notes (Signed)
Date: August 15, 2016  Discharge orders review for case management needs.  None found  Per patient or family member no additional needs at home. Anora Schwenke, BSN, RN3, CCM:  [phone] 

## 2016-08-15 NOTE — Discharge Summary (Addendum)
Discharge Summary  Tracy [name] WIO:[id number] DOB: 1950-05-28  PCP: [name], MD  Admit date: 08/11/2016 Discharge date: 08/15/2016  Time spent: <75mins  Recommendations for Outpatient Follow-up:  1. F/u with PMD within a week  for hospital discharge follow up, repeat cbc/bmp at follow up 2. F/u with GI Dr Silverio Decamp  Discharge Diagnoses:  Active Hospital Problems   Diagnosis Date Noted  . Malnutrition of moderate degree 08/12/2016  . Anxiety about health   . Acute diverticulitis 07/06/2016  . HYPERTRIGLYCERIDEMIA 12/03/2009    Resolved Hospital Problems   Diagnosis Date Noted Date Resolved  No resolved problems to display.    Discharge Condition: stable  Diet recommendation: regular diet  Filed Weights   08/11/16 1647  Weight: 73 kg (161 lb)    History of present illness:  PCP: [name], MD  Patient coming from: Home   Chief Complaint: abdominal pain  HPI: Tracy [name] is a 66 y.o. female with medical history significant of hyperlipidemia, thyroid nodules, comes to the hospital from gastroenterology office due to abdominal pain. Patient was recently hospitalized and discharged about 3 weeks ago with acute diverticulitis with abscess and microperforations.  She was slow to respond, and she has not been in the hospital for 12 days.  General surgery was consulted and have follow patient while hospitalized.  She eventually improved, and she was discharged on p.o. Augmentin.  Her hospital course was complicated by diarrhea which turned out to be positive for C. difficile.  She was treated with p.o. vancomycin.  She felt when she went home, and actually felt good that week and was out on vacation.  Startin 3 days ago on Monday, patient has been started to have recurrent left lower quadrant abdominal pain with intermittent fevers in the evening.  She went to the emergency room on 6/11 she was placed on amoxicillin.  She saw her gastroenterologist  Dr. Silverio Decamp On 6/12 she was placed on Augmentin with plans for 14 days.  Patient continued to have worsening abdominal pain, as well as fevers every evening, and decided to see her GI doctor again today.  She was direct admitted for IV antibiotics.  Patient currently denies any diarrhea, she denies any nausea or vomiting.  She has no chest pain or shortness of breath.  She denies palpitations.  Other than abdominal pain, has no other complaints   Hospital Course:  Active Problems:   HYPERTRIGLYCERIDEMIA   Acute diverticulitis   Malnutrition of moderate degree   Anxiety about health   Recurrent acute diverticulitis -she has been suffering from this for more than a months now, she was treated in the hospital for this from 5/8to 5/20. -she is started on Zosyn per pharmacy since admission and received total of 4days iv abx treatment -she has improved, left lower quadrant pain decreased, she is on regular diet and eating more --she is cleared to discharge home by Gi with continue augmentin and probiotics with close outpatient GI follow up.   Constipation: ab x ray with moderate stool in colon, on stool regimen + BM, discharge on senna/miralax.  Recent C. difficile colitis -Patient was treated to p.o. vancomycin, her diarrhea is completely resolved -Continue Florastor  -no diarrhea   GERD -Continue Gaviscon  Hyperlipidemia -Resume fenofibrate  Non-severe (moderate) malnutrition in context of acute illness/injury Malnutrition (moderate) related to acute illness (multiple severe diverticulitis flares with abscess) as evidenced by moderate depletion of body fat, moderate depletions of muscle mass, 7 percent weight  loss I month. Nutrition input appreciated.  DVT prophylaxis while in the hospital:Lovenox Code Status:Full code Family Communication:patient and husband Disposition Plan:  d/c home on 6/18 with gi clearance Consults  called:GI   Procedures:  none  Antibiotics:  zosyn    Discharge Exam: BP 114/60 (BP Location: Left Arm)   Pulse 84   Temp 98.2 F (36.8 C) (Oral)   Resp 16   Ht 5\' 4"  (1.626 m)   Wt 73 kg (161 lb) Comment: Per Pt  SpO2 94%   BMI 27.64 kg/m   General: NAD Cardiovascular: RRR Respiratory: CATBL Abdomen: less tender left lower quadrant, no guarding, no rebound,  positive BS  Discharge Instructions You were cared for by a hospitalist during your hospital stay. If you have any questions about your discharge medications or the care you received while you were in the hospital after you are discharged, you can call the unit and asked to speak with the hospitalist on call if the hospitalist that took care of you is not available. Once you are discharged, your primary care physician will handle any further medical issues. Please note that NO REFILLS for any discharge medications will be authorized once you are discharged, as it is imperative that you return to your primary care physician (or establish a relationship with a primary care physician if you do not have one) for your aftercare needs so that they can reassess your need for medications and monitor your lab values.  Discharge Instructions    Diet general    Complete by:  As directed    Increase activity slowly    Complete by:  As directed      Allergies as of 08/15/2016      Reactions   Demerol [meperidine] Nausea And Vomiting   Vicodin [hydrocodone-acetaminophen] Other (See Comments)   hallucinations   Latex Rash, Other (See Comments)   Reaction:  Blisters       Medication List    TAKE these medications   amoxicillin-clavulanate 875-125 MG tablet Commonly known as:  AUGMENTIN Take 1 tablet by mouth 2 (two) times daily.   famotidine 20 MG tablet Commonly known as:  PEPCID Take 1 tablet (20 mg total) by mouth 2 (two) times daily.   feeding supplement (ENSURE ENLIVE) Liqd Take 237 mLs by mouth 2 (two)  times daily between meals.   fenofibrate 160 MG tablet Take 1 tablet (160 mg total) by mouth daily.   fluticasone 50 MCG/ACT nasal spray Commonly known as:  FLONASE Place 1 spray into both nostrils daily.   oxyCODONE-acetaminophen 5-325 MG tablet Commonly known as:  PERCOCET Take 1-2 tablets by mouth every 6 (six) hours as needed.   polyethylene glycol packet Commonly known as:  MIRALAX / GLYCOLAX Take 17 g by mouth daily.   saccharomyces boulardii 250 MG capsule Commonly known as:  FLORASTOR Take 1 capsule (250 mg total) by mouth 2 (two) times daily.   senna-docusate 8.6-50 MG tablet Commonly known as:  Senokot-S Take 1 tablet by mouth at bedtime.      Allergies  Allergen Reactions  . Demerol [Meperidine] Nausea And Vomiting  . Vicodin [Hydrocodone-Acetaminophen] Other (See Comments)    hallucinations  . Latex Rash and Other (See Comments)    Reaction:  Blisters    Follow-up Information    [name], MD Follow up.   Specialty:  Family Medicine Contact information: [name] [place] [address] [phone]        Nandigam,  Venia Minks, MD Follow up in 2 week(s).   Specialty:  Gastroenterology Why:  for diverticulitis  Contact information: [place] [id number] [phone]            The results of significant diagnostics from this hospitalization (including imaging, microbiology, ancillary and laboratory) are listed below for reference.    Significant Diagnostic Studies: Abd 1 View (kub)  Result Date: 08/11/2016 CLINICAL DATA:  History of diverticulitis with right lower quadrant abdominal pain EXAM: ABDOMEN - 1 VIEW COMPARISON:  08/08/2016 FINDINGS: Surgical clips in the right upper quadrant. Overall nonobstructed bowel gas pattern with moderate stool in the colon. No abnormal calcification. IMPRESSION: Nonobstructed bowel-gas pattern Electronically Signed   By: Donavan Foil M.D.   On: 08/11/2016 19:31   Ct  Abdomen Pelvis W Contrast  Result Date: 08/08/2016 CLINICAL DATA:  Diarrhea.  Recent C diff infection. EXAM: CT ABDOMEN AND PELVIS WITH CONTRAST TECHNIQUE: Multidetector CT imaging of the abdomen and pelvis was performed using the standard protocol following bolus administration of intravenous contrast. CONTRAST:  143mL ISOVUE-300 IOPAMIDOL (ISOVUE-300) INJECTION 61% COMPARISON:  07/15/2016 FINDINGS: Lower chest:  Unremarkable. Hepatobiliary: Stable tiny hypodensities in the liver parenchyma, too small to characterize but likely cysts. Surgical clips along the anterior aspect lateral segment left liver are stable. Gallbladder surgically absent. No intrahepatic or extrahepatic biliary dilation. Pancreas: No focal mass lesion. No dilatation of the main duct. No intraparenchymal cyst. No peripancreatic edema. Spleen: No splenomegaly. No focal mass lesion. Adrenals/Urinary Tract: Right adrenal gland unremarkable. Stable 2.1 x 2.3 cm left adrenal nodule slightly progressive since 09/03/2010. Washout on today's study is insufficient to allow classification as adenoma, but lesion measured an average of -12 Hounsfield units on CT scan from 09/26/2007. Kidneys are unremarkable. No evidence for hydroureter. The urinary bladder appears normal for the degree of distention. Stomach/Bowel: Tiny hiatal hernia. Stomach unremarkable. Duodenum is normally positioned as is the ligament of Treitz. No small bowel wall thickening. No small bowel dilatation. The terminal ileum is normal. The appendix is not visualized, but there is no edema or inflammation in the region of the cecum. No diffuse colonic wall thickening. Diverticular changes are seen in the left colon and there is a focal area of pericolonic edema/ inflammation associated with the proximal sigmoid segment. No evidence for perforation or abscess. Vascular/Lymphatic: There is abdominal aortic atherosclerosis without aneurysm. There is no gastrohepatic or hepatoduodenal  ligament lymphadenopathy. No intraperitoneal or retroperitoneal lymphadenopathy. No pelvic sidewall lymphadenopathy. Reproductive: The uterus has normal CT imaging appearance. There is no adnexal mass. Other: No intraperitoneal free fluid. Musculoskeletal: Degenerative changes are noted in the hips. Bone windows reveal no worrisome lytic or sclerotic osseous lesions. IMPRESSION: 1. Continued interval improvement in the sigmoid pericolonic edema/inflammation. Small abscess seen on prior study has resolved in the interval. No evidence for extraluminal free gas or abscess on today's study. 2. 2.1 x 2.3 cm left adrenal nodule slowly progressive since 2009. Absolute attenuation of the nodule in 2009 was -12 Hounsfield units, suggesting adenoma as the etiology. 3. Tiny hepatic cysts. Abdominal Aortic Atherosclerois (ICD10-170.0) Electronically Signed   By: Misty Stanley M.D.   On: 08/08/2016 19:56    Microbiology: No results found for this or any previous visit (from the past 240 hour(s)).   Labs: Basic Metabolic Panel:  Recent Labs Lab 08/09/16 1539 08/11/16 1817 08/12/16 0624 08/13/16 0516 08/15/16 0542  NA 141 138 140 140 142  K 4.0 3.9 4.4 4.8 4.0  CL 107 105  105 105 105  CO2 28 27 27 29 31   GLUCOSE 121* 101* 92 99 117*  BUN 20 12 13 11 13   CREATININE 0.83 0.76 0.81 0.78 0.77  CALCIUM 9.9 9.1 9.3 9.5 9.4  MG  --   --   --  1.8 2.2   Liver Function Tests:  Recent Labs Lab 08/08/16 1749 08/11/16 1817  AST 32 18  ALT 24 15  ALKPHOS 69 76  BILITOT 1.0 0.4  PROT 7.8 6.9  ALBUMIN 4.6 3.5   No results for input(s): LIPASE, AMYLASE in the last 168 hours. No results for input(s): AMMONIA in the last 168 hours. CBC:  Recent Labs Lab 08/09/16 1539 08/11/16 1817 08/12/16 0624 08/13/16 0516 08/15/16 0542  WBC 14.9* 7.8 7.0 7.4 7.0  NEUTROABS 13.0* 5.8  --  5.2 4.2  HGB 12.8 11.8* 12.7 12.1 11.8*  HCT 38.2 34.5* 36.9 35.9* 35.8*  MCV 89.1 87.3 88.7 88.0 87.1  PLT 186.0 201 234  260 323   Cardiac Enzymes: No results for input(s): CKTOTAL, CKMB, CKMBINDEX, TROPONINI in the last 168 hours. BNP: BNP (last 3 results) No results for input(s): BNP in the last 8760 hours.  ProBNP (last 3 results) No results for input(s): PROBNP in the last 8760 hours.  CBG:  Recent Labs Lab 08/08/16 1439  GLUCAP 128*       SignedFlorencia Reasons MD, PhD  [facility] 08/15/2016, 11:07 AM

## 2016-08-15 NOTE — Progress Notes (Signed)
    Progress Note   Subjective  Pain improved. Having regular bowel movements. Tolerating regular diet.   Objective  Vital signs in last 24 hours: Temp:  [97.5 F (36.4 C)-99.1 F (37.3 C)] 98.2 F (36.8 C) (06/18 0500) Pulse Rate:  [84-93] 84 (06/18 0500) Resp:  [16-20] 16 (06/18 0500) BP: (114-131)/(60-64) 114/60 (06/18 0500) SpO2:  [94 %-98 %] 94 % (06/18 0500) Last BM Date: 08/15/16  General: Alert, well-developed, in NAD Heart:  Regular rate and rhythm; no murmurs Chest: Clear to ascultation bilaterally Abdomen:  Soft, mild LLQ tenderness and nondistended. Normal bowel sounds, without guarding, and without rebound.   Extremities:  Without edema. Neurologic:  Alert and  oriented x4; grossly normal neurologically. Psych:  Alert and cooperative. Normal mood and affect.  Intake/Output from previous day: 06/17 0701 - 06/18 0700 In: 670 [P.O.:570; IV Piggyback:100] Out: -  Intake/Output this shift: No intake/output data recorded.  Lab Results:  Recent Labs  08/13/16 0516 08/15/16 0542  WBC 7.4 7.0  HGB 12.1 11.8*  HCT 35.9* 35.8*  PLT 260 323   BMET  Recent Labs  08/13/16 0516 08/15/16 0542  NA 140 142  K 4.8 4.0  CL 105 105  CO2 29 31  GLUCOSE 99 117*  BUN 11 13  CREATININE 0.78 0.77  CALCIUM 9.5 9.4      Assessment & Plan   1. Acute sigmoid colon diverticulitis, resolving on IV Zosyn. Advanced to regular diet yesterday.  OK for discharge to home today.   Follow up with Dr. Silverio Decamp in about 2 weeks.  Augmentin po bid for at least 7 days. Florastor po bid for 6 weeks.  Senakot and/or Miralax for constipation.  GI signing off.   Active Problems:   HYPERTRIGLYCERIDEMIA   Acute diverticulitis   Malnutrition of moderate degree   Anxiety about health    LOS: 4 days   Tracy [name] T. Fuller Plan MD 08/15/2016, 8:39 AM

## 2016-08-16 ENCOUNTER — Encounter: Payer: Self-pay | Admitting: Gastroenterology

## 2016-08-16 ENCOUNTER — Telehealth: Payer: Self-pay

## 2016-08-16 ENCOUNTER — Ambulatory Visit: Payer: Medicare Other | Admitting: Physician Assistant

## 2016-08-16 NOTE — Telephone Encounter (Signed)
Transition Care Management Follow-up Telephone Call   Date discharged? 08/15/16   How have you been since you were released from the hospital? "Not as painful as it was"   Do you understand why you were in the hospital? Yes, diverticulitis   Do you understand the discharge instructions? yes   Where were you discharged to? Home   Items Reviewed:  Medications reviewed: yes  Allergies reviewed: yes  Dietary changes reviewed: yes  Referrals reviewed: yes. F/U with GI 08/17/16   Functional Questionnaire:   Activities of Daily Living (ADLs):   She states they are independent in the following: ambulation, bathing and hygiene, feeding, continence, grooming, toileting and dressing States they require assistance with the following: None   Any transportation issues/concerns?: no   Any patient concerns? no   Confirmed importance and date/time of follow-up visits scheduled yes  Provider Appointment booked with PCP on 08/23/16 @ 11am   Confirmed with patient if condition begins to worsen call PCP or go to the ER.  Patient was given the office number and encouraged to call back with question or concerns.  : yes

## 2016-08-17 ENCOUNTER — Ambulatory Visit: Payer: Medicare Other | Admitting: Gastroenterology

## 2016-08-22 ENCOUNTER — Other Ambulatory Visit: Payer: Self-pay | Admitting: *Deleted

## 2016-08-22 NOTE — Patient Outreach (Signed)
[facility]) Care Management  08/22/2016  Tracy [name] Aug 28, 1950 [id number]  Transition of Care Referral  Referral Date: 08/22/16 Referral Source: [facility] High Risk Date of Discharge: 08/15/16 Facility: [facility] Discharge Diagnosis: Malnutrition of moderate degree Insurance: [employer] Medicare  TOC has been completed by primary care provider's office who will refer to [place] [facility], if needed.  Plan: RN CM will notify [facility] CM administrative assistant regarding case closure.   Lake Bells, RN, BSN, MHA/MSL, [place] Telephonic Care Manager Coordinator [facility] Direct Phone: [phone] Toll Free: [phone] Fax: [phone]

## 2016-08-23 ENCOUNTER — Ambulatory Visit (INDEPENDENT_AMBULATORY_CARE_PROVIDER_SITE_OTHER): Payer: Medicare Other | Admitting: Family Medicine

## 2016-08-23 ENCOUNTER — Encounter: Payer: Self-pay | Admitting: Family Medicine

## 2016-08-23 VITALS — BP 112/68 | HR 65 | Temp 98.6°F | Resp 16 | Ht 64.0 in | Wt 155.1 lb

## 2016-08-23 DIAGNOSIS — E440 Moderate protein-calorie malnutrition: Secondary | ICD-10-CM

## 2016-08-23 DIAGNOSIS — K5792 Diverticulitis of intestine, part unspecified, without perforation or abscess without bleeding: Secondary | ICD-10-CM | POA: Diagnosis not present

## 2016-08-23 MED ORDER — SUCRALFATE 1 G PO TABS: 1.0000 g | ORAL_TABLET | Freq: Three times a day (TID) | ORAL | 0 refills | Status: DC

## 2016-08-23 MED ORDER — PANTOPRAZOLE SODIUM 40 MG PO TBEC: 40.0000 mg | DELAYED_RELEASE_TABLET | Freq: Two times a day (BID) | ORAL | 1 refills | Status: DC

## 2016-08-23 NOTE — Patient Instructions (Signed)
Follow up in 2-3 weeks to recheck mood and eating STOP the Pepcid START the Pantoprazole twice daily until our next appt TAKE the Sulcrafate prior to meals and before bed to coat the stomach and allow you to eat w/ less discomfort Increase your protein intake in the form of Ensure (up to 3x/day) or chicken/turkey/etc Try and advance your diet.  This will improve headaches and dizziness Make sure you are drinking plenty of fluids REST!  Allow yourself time to recover Call with any questions or concerns Hang in there!!!

## 2016-08-23 NOTE — Progress Notes (Signed)
   Subjective:    Patient ID: Tracy [name], female    DOB: 09/14/1950, 66 y.o.   MRN: [id number]  [facility] f/u- pt was admitted 6/14-18 w/ recurrent Diverticulitis.  She was on Zosyn while hospitalized and transitioned to oral Augmentin at d/c.  She was started on Pepcid BID and Ensure due to mild protein/calorie malnutrition on setting of recent illnesses.  Med rec performed.  Reviewed D/C summary, TCM call, labs, and studies. D/C summary calls for CBC and BMP but doesn't give reason why and d/c labs were WNL.  Pt denies current stomach pain but continues to have bloating, nausea, HAs, 'no energy'.  'if I didn't have to eat, I wouldn't eat'- nausea and 'horrible heart burn'.  No improvement w/ Pepcid or Gaviscon.  Pt was taken off Nexium due to increased risk of C Diff (protonix is lower risk according to UTD).  Bloating worsens throughout the day.   Review of Systems For ROS see HPI     Objective:   Physical Exam  Constitutional: She is oriented to person, place, and time. She appears well-developed and well-nourished. No distress.  HENT:  Head: Normocephalic and atraumatic.  Eyes: Conjunctivae and EOM are normal. Pupils are equal, round, and reactive to light.  Neck: Normal range of motion. Neck supple. No thyromegaly present.  Cardiovascular: Normal rate, regular rhythm, normal heart sounds and intact distal pulses.   No murmur heard. Pulmonary/Chest: Effort normal and breath sounds normal. No respiratory distress.  Abdominal: Soft. She exhibits no distension. There is no tenderness.  Musculoskeletal: She exhibits no edema.  Lymphadenopathy:    She has no cervical adenopathy.  Neurological: She is alert and oriented to person, place, and time.  Skin: Skin is warm and dry.  Psychiatric: Her behavior is normal.  Tearful, anxious  Vitals reviewed.         Assessment & Plan:  Acute Diverticulitis- resolving.  Pt has 4 days of Augmentin left.  Abdominal pain is improving  but her gastritis due to lack of appetite and oral intake is worsening.  Given her pain, she is reluctant to eat, which is worsening her malnutrition and slowing her recovery.  Start Protonix twice daily as this is a lower risk PPI in the setting of Cdiff.  Add Carafate for comfort.  Reviewed need to advance diet to improve energy level.  Reviewed supportive care and red flags that should prompt return.  Pt expressed understanding and is in agreement w/ plan.

## 2016-08-23 NOTE — Progress Notes (Signed)
Pre visit review using our clinic review tool, if applicable. No additional management support is needed unless otherwise documented below in the visit note. 

## 2016-08-25 ENCOUNTER — Other Ambulatory Visit: Payer: Self-pay | Admitting: Family Medicine

## 2016-08-26 ENCOUNTER — Encounter: Payer: Self-pay | Admitting: Family Medicine

## 2016-08-29 ENCOUNTER — Encounter: Payer: Self-pay | Admitting: Family Medicine

## 2016-08-29 MED ORDER — POLYETHYLENE GLYCOL 3350 17 G PO PACK: 17.0000 g | PACK | Freq: Every day | ORAL | 0 refills | Status: AC

## 2016-09-07 ENCOUNTER — Encounter: Payer: Self-pay | Admitting: Family Medicine

## 2016-09-07 ENCOUNTER — Ambulatory Visit (INDEPENDENT_AMBULATORY_CARE_PROVIDER_SITE_OTHER): Payer: Medicare Other | Admitting: Family Medicine

## 2016-09-07 VITALS — BP 110/64 | HR 83 | Temp 98.1°F | Resp 18 | Ht 64.0 in | Wt 157.8 lb

## 2016-09-07 DIAGNOSIS — Z0000 Encounter for general adult medical examination without abnormal findings: Secondary | ICD-10-CM | POA: Diagnosis not present

## 2016-09-07 DIAGNOSIS — M1710 Unilateral primary osteoarthritis, unspecified knee: Secondary | ICD-10-CM | POA: Diagnosis not present

## 2016-09-07 DIAGNOSIS — K5720 Diverticulitis of large intestine with perforation and abscess without bleeding: Secondary | ICD-10-CM | POA: Diagnosis not present

## 2016-09-07 DIAGNOSIS — K219 Gastro-esophageal reflux disease without esophagitis: Secondary | ICD-10-CM

## 2016-09-07 DIAGNOSIS — E2839 Other primary ovarian failure: Secondary | ICD-10-CM | POA: Diagnosis not present

## 2016-09-07 MED ORDER — PANTOPRAZOLE SODIUM 40 MG PO TBEC: 40.0000 mg | DELAYED_RELEASE_TABLET | Freq: Two times a day (BID) | ORAL | 0 refills | Status: DC

## 2016-09-07 NOTE — Assessment & Plan Note (Signed)
Improved since increasing PPI to twice daily and starting Carafate.  Will continue twice daily dosing of PPI as pt is fearful to decrease back to once daily.  Will finish current supply of carafate and then stop- will refill if sxs return.  Pt expressed understanding and is in agreement w/ plan.

## 2016-09-07 NOTE — Progress Notes (Signed)
   Subjective:    Patient ID: Tracy [name], female    DOB: 1950-10-16, 66 y.o.   MRN: [id number]  HPI Gastritis- pt reports sxs are improving since increasing Protonix to twice daily and taking the Carafate.  She says that she still feels full and bloated after eating.  Still has decreased appetite.  Denies nausea.  Has completed Augmentin.  Pt is concerned that this is her new 'norm'.  Pt does not have GI appt scheduled- would like me to arrange this.  Pt wants to continue her BID PPI dosing as this has helped considerably.  Would like script sent to [facility].  Pt wants to know what she can take for arthritis based on her current stomach issues.   Review of Systems For ROS see HPI     Objective:   Physical Exam  Constitutional: She is oriented to person, place, and time. She appears well-developed and well-nourished. No distress.  HENT:  Head: Normocephalic and atraumatic.  Abdominal: Soft. Bowel sounds are normal. She exhibits no distension. There is no tenderness. There is no rebound and no guarding.  Neurological: She is alert and oriented to person, place, and time.  Skin: Skin is warm and dry.  Psychiatric: She has a normal mood and affect. Her behavior is normal. Thought content normal.  Vitals reviewed.         Assessment & Plan:

## 2016-09-07 NOTE — Progress Notes (Signed)
Subjective:   Tracy [name] is a 66 y.o. female who presents for Medicare Annual (Subsequent) preventive examination.  Review of Systems:  No ROS.  Medicare Wellness Visit. Additional risk factors are reflected in the social history.  Cardiac Risk Factors include: advanced age (>50men, >77 women);dyslipidemia;family history of premature cardiovascular disease   Sleep patterns: Sleeps 7 hours, feels rested. Up to void x 2-3 times.  Home Safety/Smoke Alarms: Feels safe in home. Smoke alarms in place.  Living environment; residence and Firearm Safety: Lives with husband in 2 story home.  Seat Belt Safety/Bike Helmet: Wears seat belt.   Counseling:   Eye Exam-Last exam 09/2015, yearly.  Dental-Last exam 03/2016, every 6 months. Dr. Glennon Hamilton  Female:   Pap-2012      Mammo-04/05/2016, negative.        Dexa scan-03/20/2014, osteopenia.  Order placed. [facility]       CCS-Colonoscopy 09/06/2010, normal. [facility]      Objective:     Vitals: BP 110/64 (BP Location: Right Arm, Patient Position: Sitting, Cuff Size: Normal)   Pulse 83   Temp 98.1 F (36.7 C) (Oral)   Resp 18   Ht 5\' 4"  (1.626 m)   Wt 157 lb 12.8 oz (71.6 kg)   SpO2 97%   BMI 27.09 kg/m   Body mass index is 27.09 kg/m.   Tobacco History  Smoking Status  . Never Smoker  Smokeless Tobacco  . Never Used     Counseling given: Not Answered   Past Medical History:  Diagnosis Date  . Anxiety   . Arthritis   . Bursitis of left hip   . C. difficile diarrhea   . Cancer (HCC)    basil cell  . Complication of anesthesia   . Diverticulitis   . GERD (gastroesophageal reflux disease)   . Hx: UTI (urinary tract infection)   . Hyperlipidemia   . Hyperthyroidism   . Osteopenia   . PONV (postoperative nausea and vomiting)   . Rosacea    Past Surgical History:  Procedure Laterality Date  . APPENDECTOMY    . BACK SURGERY    . CHOLECYSTECTOMY    . FOOT SURGERY Right    X 2  . KNEE SURGERY    .  LAPAROSCOPIC LIVER CYST FENESTRATION    . OPEN SURGICAL REPAIR OF GLUTEAL TENDON Left 04/01/2014   Procedure: LEFT HIP BURSECTOMY WITH GLUTEAL TENDON REPAIR;  Surgeon: Gearlean Alf, MD;  Location: [facility];  Service: Orthopedics;  Laterality: Left;  . TONSILLECTOMY    . TOTAL KNEE ARTHROPLASTY Right 08/17/2015   Procedure: RIGHT TOTAL KNEE ARTHROPLASTY;  Surgeon: Gaynelle Arabian, MD;  Location: [facility];  Service: Orthopedics;  Laterality: Right;  . TUBAL LIGATION     Family History  Problem Relation Age of Onset  . Hypertension Mother   . Stroke Mother   . Liver disease Father        alcohol related  . Bone cancer Paternal Grandfather   . Cancer Neg Hx   . Diabetes Neg Hx   . Colon cancer Neg Hx   . Esophageal cancer Neg Hx   . Pancreatic cancer Neg Hx   . Stomach cancer Neg Hx    History  Sexual Activity  . Sexual activity: Not Currently    Outpatient Encounter Prescriptions as of 09/07/2016  Medication Sig  . feeding supplement, ENSURE ENLIVE, (ENSURE ENLIVE) LIQD Take 237 mLs by mouth 2 (two) times daily between meals.  . fenofibrate  160 MG tablet TAKE 1 TABLET BY MOUTH  DAILY  . fluticasone (FLONASE) 50 MCG/ACT nasal spray Place 1 spray into both nostrils daily.  . pantoprazole (PROTONIX) 40 MG tablet Take 1 tablet (40 mg total) by mouth 2 (two) times daily.  . polyethylene glycol (MIRALAX / GLYCOLAX) packet Take 17 g by mouth daily.  Marland Kitchen saccharomyces boulardii (FLORASTOR) 250 MG capsule Take 1 capsule (250 mg total) by mouth 2 (two) times daily.  Marland Kitchen senna-docusate (SENOKOT-S) 8.6-50 MG tablet Take 1 tablet by mouth at bedtime.  . sucralfate (CARAFATE) 1 g tablet Take 1 tablet (1 g total) by mouth 4 (four) times daily -  with meals and at bedtime.  . [DISCONTINUED] amoxicillin-clavulanate (AUGMENTIN) 875-125 MG tablet Take 1 tablet by mouth 2 (two) times daily.   No facility-administered encounter medications on file as of 09/07/2016.     Activities of Daily Living In your  present state of health, do you have any difficulty performing the following activities: 09/07/2016 08/11/2016  Hearing? N N  Vision? N N  Difficulty concentrating or making decisions? N N  Walking or climbing stairs? Y N  Dressing or bathing? N N  Doing errands, shopping? N N  Preparing Food and eating ? N -  Using the Toilet? N -  In the past six months, have you accidently leaked urine? N -  Do you have problems with loss of bowel control? N -  Managing your Medications? N -  Managing your Finances? N -  Housekeeping or managing your Housekeeping? N -  Some recent data might be hidden    Patient Care Team: [name], MD as PCP - General [name], MD as Consulting Physician (Internal Medicine) Rutherford Guys, MD as Consulting Physician (Ophthalmology) Festus Aloe, MD as Consulting Physician (Urology) Gaynelle Arabian, MD as Consulting Physician (Orthopedic Surgery) Harriett Sine, MD as Consulting Physician (Dermatology) Mauri Pole, MD as Consulting Physician (Gastroenterology)    Assessment:    Physical assessment deferred to PCP.  Exercise Activities and Dietary recommendations Current Exercise Habits: Home exercise routine, Type of exercise: walking, Time (Minutes): 10, Frequency (Times/Week): 1, Weekly Exercise (Minutes/Week): 10, Exercise limited by: orthopedic condition(s) Diet (meal preparation, eat out, water intake, caffeinated beverages, dairy products, fruits and vegetables):   Breakfast: cereal, toast; bagel; english muffin with sausage, coffee, juice Lunch: cottage cheese, peaches, crackers; chicken soup Dinner: chicken, potatoes, rice, vegetables (frozen peas, beans)  Pt suffers with diverticulitis, limits food intake.   Goals      Patient Stated   . <enter goal here> (pt-stated)          Feel normal again, begin walking again.     . patient states (pt-stated)          Would like to get back to meeting group of friends  for lunch, become social again.       Fall Risk Fall Risk  09/07/2016 05/19/2016 05/19/2016 03/13/2015 12/04/2014  Falls in the past year? No No No No No   Depression Screen PHQ 2/9 Scores 09/07/2016 08/23/2016 05/19/2016 05/19/2016  PHQ - 2 Score 2 0 2 0  PHQ- 9 Score - 0 - 0  Exception Documentation - - - -     Cognitive Function       Ad8 score reviewed for issues:  Issues making decisions: no  Less interest in hobbies / activities: no  Repeats questions, stories (family complaining): no  Trouble using ordinary gadgets (microwave, computer, phone): no  Forgets the  month or year: no  Mismanaging finances: no  Remembering appts:no  Daily problems with thinking and/or memory: no Ad8 score is=0     Immunization History  Administered Date(s) Administered  . Influenza Whole 11/28/2012  . Influenza, High Dose Seasonal PF 11/10/2014, 10/26/2015  . Influenza,inj,Quad PF,36+ Mos 12/09/2013  . Pneumococcal Conjugate-13 09/08/2014  . Pneumococcal Polysaccharide-23 07/16/2013  . Tdap 06/28/2012   Screening Tests Health Maintenance  Topic Date Due  . INFLUENZA VACCINE  09/28/2016  . MAMMOGRAM  04/05/2017  . COLONOSCOPY  09/05/2020  . TETANUS/TDAP  06/29/2022  . DEXA SCAN  Completed  . Hepatitis C Screening  Completed  . PNA vac Low Risk Adult  Completed      Plan:    Continue doing brain stimulating activities (puzzles, reading, adult coloring books, staying active) to keep memory sharp.   Schedule Bone Scan  I have personally reviewed and noted the following in the patient's chart:   . Medical and social history . Use of alcohol, tobacco or illicit drugs  . Current medications and supplements . Functional ability and status . Nutritional status . Physical activity . Advanced directives . List of other physicians . Hospitalizations, surgeries, and ER visits in previous 12 months . Vitals . Screenings to include cognitive, depression, and falls . Referrals  and appointments  In addition, I have reviewed and discussed with patient certain preventive protocols, quality metrics, and best practice recommendations. A written personalized care plan for preventive services as well as general preventive health recommendations were provided to patient.     Gerilyn Nestle, RN  09/07/2016  Reviewed documentation and agree w/ above.  Annye Asa, MD

## 2016-09-07 NOTE — Assessment & Plan Note (Signed)
Pt's sxs have resolved.  She has completed abx and is currently pain free.  Encouraged her to advance her diet.  She was told she needed a follow up colonoscopy but does not have GI f/u scheduled.  Will refer back to GI.  Pt expressed understanding and is in agreement w/ plan.

## 2016-09-07 NOTE — Assessment & Plan Note (Signed)
Chronic problem.  Pt instructed to take Tylenol arthritis rather than NSAIDs due to GI issues.

## 2016-09-07 NOTE — Patient Instructions (Addendum)
Follow up as needed/scheduled We'll call you with your GI appt You are able to eat what you want at this time Continue the Pantoprazole twice daily Once you are done with the Carafate, hold off unless the symptoms return and then we can send more in Increase the Miralax to daily Call with any questions or concerns ENJOY Nash!!!  Continue doing brain stimulating activities (puzzles, reading, adult coloring books, staying active) to keep memory sharp.   Schedule Bone Scan  Health Maintenance, Female Adopting a healthy lifestyle and getting preventive care can go a long way to promote health and wellness. Talk with your health care provider about what schedule of regular examinations is right for you. This is a good chance for you to check in with your provider about disease prevention and staying healthy. In between checkups, there are plenty of things you can do on your own. Experts have done a lot of research about which lifestyle changes and preventive measures are most likely to keep you healthy. Ask your health care provider for more information. Weight and diet Eat a healthy diet  Be sure to include plenty of vegetables, fruits, low-fat dairy products, and lean protein.  Do not eat a lot of foods high in solid fats, added sugars, or salt.  Get regular exercise. This is one of the most important things you can do for your health. ? Most adults should exercise for at least 150 minutes each week. The exercise should increase your heart rate and make you sweat (moderate-intensity exercise). ? Most adults should also do strengthening exercises at least twice a week. This is in addition to the moderate-intensity exercise.  Maintain a healthy weight  Body mass index (BMI) is a measurement that can be used to identify possible weight problems. It estimates body fat based on height and weight. Your health care provider can help determine your BMI and help you achieve or maintain a  healthy weight.  For females 69 years of age and older: ? A BMI below 18.5 is considered underweight. ? A BMI of 18.5 to 24.9 is normal. ? A BMI of 25 to 29.9 is considered overweight. ? A BMI of 30 and above is considered obese.  Watch levels of cholesterol and blood lipids  You should start having your blood tested for lipids and cholesterol at 68 years of age, then have this test every 5 years.  You may need to have your cholesterol levels checked more often if: ? Your lipid or cholesterol levels are high. ? You are older than 68 years of age. ? You are at high risk for heart disease.  Cancer screening Lung Cancer  Lung cancer screening is recommended for adults 29-42 years old who are at high risk for lung cancer because of a history of smoking.  A yearly low-dose CT scan of the lungs is recommended for people who: ? Currently smoke. ? Have quit within the past 15 years. ? Have at least a 30-pack-year history of smoking. A pack year is smoking an average of one pack of cigarettes a day for 1 year.  Yearly screening should continue until it has been 15 years since you quit.  Yearly screening should stop if you develop a health problem that would prevent you from having lung cancer treatment.  Breast Cancer  Practice breast self-awareness. This means understanding how your breasts normally appear and feel.  It also means doing regular breast self-exams. Let your health care provider know about any  changes, no matter how small.  If you are in your 20s or 30s, you should have a clinical breast exam (CBE) by a health care provider every 1-3 years as part of a regular health exam.  If you are 40 or older, have a CBE every year. Also consider having a breast X-ray (mammogram) every year.  If you have a family history of breast cancer, talk to your health care provider about genetic screening.  If you are at high risk for breast cancer, talk to your health care provider about  having an MRI and a mammogram every year.  Breast cancer gene (BRCA) assessment is recommended for women who have family members with BRCA-related cancers. BRCA-related cancers include: ? Breast. ? Ovarian. ? Tubal. ? Peritoneal cancers.  Results of the assessment will determine the need for genetic counseling and BRCA1 and BRCA2 testing.  Cervical Cancer Your health care provider may recommend that you be screened regularly for cancer of the pelvic organs (ovaries, uterus, and vagina). This screening involves a pelvic examination, including checking for microscopic changes to the surface of your cervix (Pap test). You may be encouraged to have this screening done every 3 years, beginning at age 21.  For women ages 30-65, health care providers may recommend pelvic exams and Pap testing every 3 years, or they may recommend the Pap and pelvic exam, combined with testing for human papilloma virus (HPV), every 5 years. Some types of HPV increase your risk of cervical cancer. Testing for HPV may also be done on women of any age with unclear Pap test results.  Other health care providers may not recommend any screening for nonpregnant women who are considered low risk for pelvic cancer and who do not have symptoms. Ask your health care provider if a screening pelvic exam is right for you.  If you have had past treatment for cervical cancer or a condition that could lead to cancer, you need Pap tests and screening for cancer for at least 20 years after your treatment. If Pap tests have been discontinued, your risk factors (such as having a new sexual partner) need to be reassessed to determine if screening should resume. Some women have medical problems that increase the chance of getting cervical cancer. In these cases, your health care provider may recommend more frequent screening and Pap tests.  Colorectal Cancer  This type of cancer can be detected and often prevented.  Routine colorectal cancer  screening usually begins at 68 years of age and continues through 68 years of age.  Your health care provider may recommend screening at an earlier age if you have risk factors for colon cancer.  Your health care provider may also recommend using home test kits to check for hidden blood in the stool.  A small camera at the end of a tube can be used to examine your colon directly (sigmoidoscopy or colonoscopy). This is done to check for the earliest forms of colorectal cancer.  Routine screening usually begins at age 50.  Direct examination of the colon should be repeated every 5-10 years through 68 years of age. However, you may need to be screened more often if early forms of precancerous polyps or small growths are found.  Skin Cancer  Check your skin from head to toe regularly.  Tell your health care provider about any new moles or changes in moles, especially if there is a change in a mole's shape or color.  Also tell your health care provider if   you have a mole that is larger than the size of a pencil eraser.  Always use sunscreen. Apply sunscreen liberally and repeatedly throughout the day.  Protect yourself by wearing long sleeves, pants, a wide-brimmed hat, and sunglasses whenever you are outside.  Heart disease, diabetes, and high blood pressure  High blood pressure causes heart disease and increases the risk of stroke. High blood pressure is more likely to develop in: ? People who have blood pressure in the high end of the normal range (130-139/85-89 mm Hg). ? People who are overweight or obese. ? People who are African American.  If you are 18-39 years of age, have your blood pressure checked every 3-5 years. If you are 40 years of age or older, have your blood pressure checked every year. You should have your blood pressure measured twice-once when you are at a hospital or clinic, and once when you are not at a hospital or clinic. Record the average of the two measurements.  To check your blood pressure when you are not at a hospital or clinic, you can use: ? An automated blood pressure machine at a pharmacy. ? A home blood pressure monitor.  If you are between 55 years and 79 years old, ask your health care provider if you should take aspirin to prevent strokes.  Have regular diabetes screenings. This involves taking a blood sample to check your fasting blood sugar level. ? If you are at a normal weight and have a low risk for diabetes, have this test once every three years after 68 years of age. ? If you are overweight and have a high risk for diabetes, consider being tested at a younger age or more often. Preventing infection Hepatitis B  If you have a higher risk for hepatitis B, you should be screened for this virus. You are considered at high risk for hepatitis B if: ? You were born in a country where hepatitis B is common. Ask your health care provider which countries are considered high risk. ? Your parents were born in a high-risk country, and you have not been immunized against hepatitis B (hepatitis B vaccine). ? You have HIV or AIDS. ? You use needles to inject street drugs. ? You live with someone who has hepatitis B. ? You have had sex with someone who has hepatitis B. ? You get hemodialysis treatment. ? You take certain medicines for conditions, including cancer, organ transplantation, and autoimmune conditions.  Hepatitis C  Blood testing is recommended for: ? Everyone born from 1945 through 1965. ? Anyone with known risk factors for hepatitis C.  Sexually transmitted infections (STIs)  You should be screened for sexually transmitted infections (STIs) including gonorrhea and chlamydia if: ? You are sexually active and are younger than 68 years of age. ? You are older than 68 years of age and your health care provider tells you that you are at risk for this type of infection. ? Your sexual activity has changed since you were last screened  and you are at an increased risk for chlamydia or gonorrhea. Ask your health care provider if you are at risk.  If you do not have HIV, but are at risk, it may be recommended that you take a prescription medicine daily to prevent HIV infection. This is called pre-exposure prophylaxis (PrEP). You are considered at risk if: ? You are sexually active and do not regularly use condoms or know the HIV status of your partner(s). ? You take drugs by   injection. ? You are sexually active with a partner who has HIV.  Talk with your health care provider about whether you are at high risk of being infected with HIV. If you choose to begin PrEP, you should first be tested for HIV. You should then be tested every 3 months for as long as you are taking PrEP. Pregnancy  If you are premenopausal and you may become pregnant, ask your health care provider about preconception counseling.  If you may become pregnant, take 400 to 800 micrograms (mcg) of folic acid every day.  If you want to prevent pregnancy, talk to your health care provider about birth control (contraception). Osteoporosis and menopause  Osteoporosis is a disease in which the bones lose minerals and strength with aging. This can result in serious bone fractures. Your risk for osteoporosis can be identified using a bone density scan.  If you are 65 years of age or older, or if you are at risk for osteoporosis and fractures, ask your health care provider if you should be screened.  Ask your health care provider whether you should take a calcium or vitamin D supplement to lower your risk for osteoporosis.  Menopause may have certain physical symptoms and risks.  Hormone replacement therapy may reduce some of these symptoms and risks. Talk to your health care provider about whether hormone replacement therapy is right for you. Follow these instructions at home:  Schedule regular health, dental, and eye exams.  Stay current with your  immunizations.  Do not use any tobacco products including cigarettes, chewing tobacco, or electronic cigarettes.  If you are pregnant, do not drink alcohol.  If you are breastfeeding, limit how much and how often you drink alcohol.  Limit alcohol intake to no more than 1 drink per day for nonpregnant women. One drink equals 12 ounces of beer, 5 ounces of wine, or 1 ounces of hard liquor.  Do not use street drugs.  Do not share needles.  Ask your health care provider for help if you need support or information about quitting drugs.  Tell your health care provider if you often feel depressed.  Tell your health care provider if you have ever been abused or do not feel safe at home. This information is not intended to replace advice given to you by your health care provider. Make sure you discuss any questions you have with your health care provider. Document Released: 08/30/2010 Document Revised: 07/23/2015 Document Reviewed: 11/18/2014 Elsevier Interactive Patient Education  2018 [employer]  

## 2016-09-22 ENCOUNTER — Ambulatory Visit: Payer: Medicare Other | Admitting: Internal Medicine

## 2016-10-04 ENCOUNTER — Encounter: Payer: Self-pay | Admitting: Family Medicine

## 2016-10-04 MED ORDER — FLUTICASONE PROPIONATE 50 MCG/ACT NA SUSP: 1.0000 | Freq: Every day | NASAL | 3 refills | Status: DC

## 2016-10-07 ENCOUNTER — Telehealth: Payer: Self-pay | Admitting: Emergency Medicine

## 2016-10-07 DIAGNOSIS — Z471 Aftercare following joint replacement surgery: Secondary | ICD-10-CM | POA: Diagnosis not present

## 2016-10-07 DIAGNOSIS — M1712 Unilateral primary osteoarthritis, left knee: Secondary | ICD-10-CM | POA: Diagnosis not present

## 2016-10-07 DIAGNOSIS — Z96651 Presence of right artificial knee joint: Secondary | ICD-10-CM | POA: Diagnosis not present

## 2016-10-07 DIAGNOSIS — M1711 Unilateral primary osteoarthritis, right knee: Secondary | ICD-10-CM | POA: Diagnosis not present

## 2016-10-07 NOTE — Telephone Encounter (Signed)
Please let patient know she will need to call [facility] at [phone] to request to be added to cancellation list to possible get a sooner appt.

## 2016-10-07 NOTE — Telephone Encounter (Signed)
Called and advised pt, she expressed an understanding.

## 2016-10-07 NOTE — Telephone Encounter (Signed)
Patient calling to check on her appointment for GI. She has pending appt with [facility] on 11/30/16. With all she had going on she would like a sooner appointment. She was unsure if she was on the cancellation list. She would like to be added to the list.

## 2016-11-01 ENCOUNTER — Other Ambulatory Visit: Payer: Self-pay | Admitting: Family Medicine

## 2016-11-08 ENCOUNTER — Ambulatory Visit: Payer: Medicare Other | Admitting: Internal Medicine

## 2016-11-09 ENCOUNTER — Encounter: Payer: Self-pay | Admitting: Family Medicine

## 2016-11-17 ENCOUNTER — Ambulatory Visit: Payer: Medicare Other | Admitting: Family Medicine

## 2016-11-23 ENCOUNTER — Encounter: Payer: Self-pay | Admitting: Family Medicine

## 2016-11-24 ENCOUNTER — Ambulatory Visit (INDEPENDENT_AMBULATORY_CARE_PROVIDER_SITE_OTHER): Payer: Medicare Other | Admitting: Family Medicine

## 2016-11-24 ENCOUNTER — Encounter: Payer: Self-pay | Admitting: Family Medicine

## 2016-11-24 VITALS — BP 109/68 | HR 68 | Temp 98.0°F | Resp 17 | Ht 64.0 in | Wt 157.5 lb

## 2016-11-24 DIAGNOSIS — E781 Pure hyperglyceridemia: Secondary | ICD-10-CM | POA: Diagnosis not present

## 2016-11-24 DIAGNOSIS — E0590 Thyrotoxicosis, unspecified without thyrotoxic crisis or storm: Secondary | ICD-10-CM | POA: Diagnosis not present

## 2016-11-24 LAB — T4, FREE: Free T4: 1.41 ng/dL (ref 0.60–1.60)

## 2016-11-24 LAB — LIPID PANEL
CHOL/HDL RATIO: 3
Cholesterol: 176 mg/dL (ref 0–200)
HDL: 70.3 mg/dL (ref >39.00)
LDL CALC: 88 mg/dL (ref 0–99)
NonHDL: 105.87
Triglycerides: 87 mg/dL (ref 0.0–149.0)
VLDL: 17.4 mg/dL (ref 0.0–40.0)

## 2016-11-24 LAB — T3, FREE: T3 FREE: 3.3 pg/mL (ref 2.3–4.2)

## 2016-11-24 LAB — BASIC METABOLIC PANEL
BUN: 24 mg/dL — AB (ref 6–23)
CALCIUM: 10.5 mg/dL (ref 8.4–10.5)
CO2: 31 mEq/L (ref 19–32)
CREATININE: 0.91 mg/dL (ref 0.40–1.20)
Chloride: 104 mEq/L (ref 96–112)
GFR: 65.22 mL/min (ref >60.00)
Glucose, Bld: 91 mg/dL (ref 70–99)
Potassium: 4.5 mEq/L (ref 3.5–5.1)
Sodium: 142 mEq/L (ref 135–145)

## 2016-11-24 LAB — HEPATIC FUNCTION PANEL
ALBUMIN: 4.2 g/dL (ref 3.5–5.2)
ALT: 11 U/L (ref 0–35)
AST: 18 U/L (ref 0–37)
Alkaline Phosphatase: 67 U/L (ref 39–117)
BILIRUBIN DIRECT: 0.1 mg/dL (ref 0.0–0.3)
TOTAL PROTEIN: 7 g/dL (ref 6.0–8.3)
Total Bilirubin: 0.5 mg/dL (ref 0.2–1.2)

## 2016-11-24 LAB — TSH: TSH: 0.08 u[IU]/mL — AB (ref 0.35–4.50)

## 2016-11-24 NOTE — Assessment & Plan Note (Signed)
Chronic problem.  Pt is again symptomatic.  Check labs and determine if additional w/u or therapy is needed

## 2016-11-24 NOTE — Progress Notes (Signed)
   Subjective:    Patient ID: Tracy [name], female    DOB: November 19, 1950, 66 y.o.   MRN: [id number]  HPI Hyperlipidemia- pt has hx of elevated triglycerides, on Fenofibrate.  Pt is walking 30 minutes/day.  No CP, SOB, abd pain, N/V.  Hyperthyroid- pt has not seen Dr Cruzita Lederer recently and reports she is again having sxs of soaking sweats at night.  No palpitations.  Denies fatigue.  Pt reports dry, britel nails.  No changes to skin or hair.   Review of Systems For ROS see HPI     Objective:   Physical Exam  Constitutional: She is oriented to person, place, and time. She appears well-developed and well-nourished. No distress.  HENT:  Head: Normocephalic and atraumatic.  Eyes: Pupils are equal, round, and reactive to light. Conjunctivae and EOM are normal.  Neck: Normal range of motion. Neck supple. No thyromegaly present.  Cardiovascular: Normal rate, regular rhythm, normal heart sounds and intact distal pulses.   No murmur heard. Pulmonary/Chest: Effort normal and breath sounds normal. No respiratory distress.  Abdominal: Soft. She exhibits no distension. There is no tenderness.  Musculoskeletal: She exhibits no edema.  Lymphadenopathy:    She has no cervical adenopathy.  Neurological: She is alert and oriented to person, place, and time.  Skin: Skin is warm and dry.  Psychiatric: She has a normal mood and affect. Her behavior is normal.  Vitals reviewed.         Assessment & Plan:

## 2016-11-24 NOTE — Patient Instructions (Signed)
Schedule your complete physical and Medicare Wellness Visit in 6 months We'll notify you of your lab results and make any changes if needed Continue to work on healthy diet and regular exercise- you look great! Call with any questions or concerns Happy Fall!!!

## 2016-11-24 NOTE — Progress Notes (Signed)
Pre visit review using our clinic review tool, if applicable. No additional management support is needed unless otherwise documented below in the visit note. 

## 2016-11-24 NOTE — Assessment & Plan Note (Signed)
Chronic problem.  Tolerating Fenofibrate w/o difficulty.  Pt is again exercising.  Applauded her efforts.  Check labs.  Adjust meds prn

## 2016-11-25 ENCOUNTER — Telehealth: Payer: Self-pay

## 2016-11-25 DIAGNOSIS — E038 Other specified hypothyroidism: Secondary | ICD-10-CM

## 2016-11-25 DIAGNOSIS — E039 Hypothyroidism, unspecified: Secondary | ICD-10-CM

## 2016-11-25 MED ORDER — METHIMAZOLE 5 MG PO TABS: 5.0000 mg | ORAL_TABLET | Freq: Every day | ORAL | 2 refills | Status: DC

## 2016-11-25 NOTE — Telephone Encounter (Signed)
-----   Message from [name], MD sent at 11/24/2016  5:11 PM EDT ----- Tracy [name], can you please call pt: As TSH is still low, let's start a low dose MMI 5 mg daily with a meal and have her back for labs: TSH, [name] T4 and [name] T3 in 2 months. Can you please order these? Please let us know: Please stop the Methimazole (Tapazole) and call us or your primary care doctor if you develop: - sore throat - fever - yellow skin - dark urine - light colored stools As we will then need to check your blood counts and liver tests.

## 2016-11-30 ENCOUNTER — Encounter: Payer: Self-pay | Admitting: Gastroenterology

## 2016-11-30 ENCOUNTER — Ambulatory Visit (INDEPENDENT_AMBULATORY_CARE_PROVIDER_SITE_OTHER): Payer: Medicare Other | Admitting: Gastroenterology

## 2016-11-30 VITALS — BP 108/66 | HR 72 | Ht 64.0 in | Wt 157.2 lb

## 2016-11-30 DIAGNOSIS — K5730 Diverticulosis of large intestine without perforation or abscess without bleeding: Secondary | ICD-10-CM | POA: Diagnosis not present

## 2016-11-30 DIAGNOSIS — K5792 Diverticulitis of intestine, part unspecified, without perforation or abscess without bleeding: Secondary | ICD-10-CM

## 2016-11-30 DIAGNOSIS — K5720 Diverticulitis of large intestine with perforation and abscess without bleeding: Secondary | ICD-10-CM

## 2016-11-30 MED ORDER — NA SULFATE-K SULFATE-MG SULF 17.5-3.13-1.6 GM/177ML PO SOLN: ORAL | 0 refills | Status: DC

## 2016-11-30 NOTE — Progress Notes (Signed)
ARTI TRANG    [id number]    1950-09-11  Primary Care Physician:Tabori, Aundra Millet, MD  Referring Physician: [name], MD [name] [place], [place] [address]  Chief complaint: GERD, Diverticulitis with microperforation  HPI: 66 year old female with severe sigmoid diverticulosis and recent hospitalization in May with complicated diverticulitis with microperforation and abscess. Patient was seen in office 08/11/2016 for worsening left lower quadrant abdominal pain, fever and lethargy, was rehospitalized due to concern persistent diverticulitis. CT abdomen and pelvis showed improvement of abscess and she was treated with IV antibiotics with improvement of symptoms. She completed course of antibiotics post hospitalization. He currently has no abdominal pain, nausea, vomiting, melena or blood per rectum. She is taking MiraLAX daily with regular soft bowel movements.    Outpatient Encounter Prescriptions as of 11/30/2016  Medication Sig  . fenofibrate 160 MG tablet TAKE 1 TABLET BY MOUTH  DAILY  . fluticasone (FLONASE) 50 MCG/ACT nasal spray Place 1 spray into both nostrils daily.  . methimazole (TAPAZOLE) 5 MG tablet Take 1 tablet (5 mg total) by mouth daily.  . pantoprazole (PROTONIX) 40 MG tablet TAKE 1 TABLET BY MOUTH TWO  TIMES DAILY  . polyethylene glycol (MIRALAX / GLYCOLAX) packet Take 17 g by mouth daily.   No facility-administered encounter medications on file as of 11/30/2016.     Allergies as of 11/30/2016 - Review Complete 11/24/2016  Allergen Reaction Noted  . Demerol [meperidine] Nausea And Vomiting 12/13/2013  . Vicodin [hydrocodone-acetaminophen] Other (See Comments) 07/27/2016  . Latex Rash and Other (See Comments) 07/28/2015    Past Medical History:  Diagnosis Date  . Anxiety   . Arthritis   . Bursitis of left hip   . C. difficile diarrhea   . Cancer (HCC)    basil cell  . Complication of anesthesia   . Diverticulitis   .  GERD (gastroesophageal reflux disease)   . Hx: UTI (urinary tract infection)   . Hyperlipidemia   . Hyperthyroidism   . Osteopenia   . PONV (postoperative nausea and vomiting)   . Rosacea     Past Surgical History:  Procedure Laterality Date  . APPENDECTOMY    . BACK SURGERY    . CHOLECYSTECTOMY    . FOOT SURGERY Right    X 2  . KNEE SURGERY    . LAPAROSCOPIC LIVER CYST FENESTRATION    . OPEN SURGICAL REPAIR OF GLUTEAL TENDON Left 04/01/2014   Procedure: LEFT HIP BURSECTOMY WITH GLUTEAL TENDON REPAIR;  Surgeon: Gearlean Alf, MD;  Location: [facility];  Service: Orthopedics;  Laterality: Left;  . TONSILLECTOMY    . TOTAL KNEE ARTHROPLASTY Right 08/17/2015   Procedure: RIGHT TOTAL KNEE ARTHROPLASTY;  Surgeon: Gaynelle Arabian, MD;  Location: [facility];  Service: Orthopedics;  Laterality: Right;  . TUBAL LIGATION      Family History  Problem Relation Age of Onset  . Hypertension Mother   . Stroke Mother   . Liver disease Father        alcohol related  . Bone cancer Paternal Grandfather   . Cancer Neg Hx   . Diabetes Neg Hx   . Colon cancer Neg Hx   . Esophageal cancer Neg Hx   . Pancreatic cancer Neg Hx   . Stomach cancer Neg Hx     Social History   Social History  . Marital status: Married    Spouse name: N/A  . Number  of children: N/A  . Years of education: N/A   Occupational History  . Not on file.   Social History Main Topics  . Smoking status: Never Smoker  . Smokeless tobacco: Never Used  . Alcohol use No  . Drug use: No  . Sexual activity: Not Currently   Other Topics Concern  . Not on file   Social History Narrative  . No narrative on file      Review of systems: Review of Systems  Constitutional: Negative for fever and chills.  HENT: Negative.   Eyes: Negative for blurred vision.  Respiratory: Negative for cough, shortness of breath and wheezing.   Cardiovascular: Negative for chest pain and palpitations.  Gastrointestinal: as per  HPI Genitourinary: Negative for dysuria, urgency, frequency and hematuria.  Musculoskeletal: Negative for myalgias, back pain and joint pain.  Skin: Negative for itching and rash.  Neurological: Negative for dizziness, tremors, focal weakness, seizures and loss of consciousness.  Endo/Heme/Allergies: Positive for seasonal allergies.  Psychiatric/Behavioral: Negative for depression, suicidal ideas and hallucinations.  All other systems reviewed and are negative.   Physical Exam: Vitals:   11/30/16 1450  BP: 108/66  Pulse: 72   Body mass index is 26.98 kg/m. Gen:      No acute distress HEENT:  EOMI, sclera anicteric Neck:     No masses; no thyromegaly Lungs:    Clear to auscultation bilaterally; normal respiratory effort CV:         Regular rate and rhythm; no murmurs Abd:      + bowel sounds; soft, non-tender; no palpable masses, no distension Ext:    No edema; adequate peripheral perfusion Skin:      Warm and dry; no rash Neuro: alert and oriented x 3 Psych: normal mood and affect  Data Reviewed:  Reviewed labs, radiology imaging, old records and pertinent past GI work up  Colonoscopy 09/06/2010: Multiple sigmoid diverticula, generalized mucosal injury consistent with ischemic colitis extending from sigmoid colon to the distal transverse colon  Assessment and Plan/Recommendations: 66 year old female with severe diverticulosis complicated by diverticulitis with microperforation and abscess here for follow-up visit. Patient is currently symptom free We'll schedule for colonoscopy for evaluation status post diverticulitis Patient had findings suggestive of ischemic colitis on last colonoscopy in 2012 The risks and benefits as well as alternatives of endoscopic procedure(s) have been discussed and reviewed. All questions answered. The patient agrees to proceed. Avoid nsaids  Damaris Hippo , MD [phone] Mon-Fri 8a-5p [phone] after 5p, weekends, holidays  CC: [name], MD

## 2016-11-30 NOTE — Patient Instructions (Signed)

## 2016-12-05 ENCOUNTER — Other Ambulatory Visit: Payer: Self-pay | Admitting: Family Medicine

## 2016-12-05 MED ORDER — PANTOPRAZOLE SODIUM 40 MG PO TBEC
40.0000 mg | DELAYED_RELEASE_TABLET | Freq: Two times a day (BID) | ORAL | 0 refills | Status: DC
Start: 2016-12-05 — End: 2017-04-05

## 2016-12-22 ENCOUNTER — Encounter: Payer: Self-pay | Admitting: Gastroenterology

## 2016-12-30 DIAGNOSIS — M1712 Unilateral primary osteoarthritis, left knee: Secondary | ICD-10-CM | POA: Diagnosis not present

## 2016-12-30 DIAGNOSIS — M222X2 Patellofemoral disorders, left knee: Secondary | ICD-10-CM | POA: Diagnosis not present

## 2017-01-03 ENCOUNTER — Telehealth: Payer: Self-pay | Admitting: Gastroenterology

## 2017-01-03 NOTE — Telephone Encounter (Signed)
Patient takes daily Miralax or she developes hard stools. Advised it is okay to continue.

## 2017-01-05 ENCOUNTER — Other Ambulatory Visit: Payer: Self-pay

## 2017-01-05 ENCOUNTER — Ambulatory Visit (AMBULATORY_SURGERY_CENTER): Payer: Medicare Other | Admitting: Gastroenterology

## 2017-01-05 ENCOUNTER — Encounter: Payer: Self-pay | Admitting: Gastroenterology

## 2017-01-05 VITALS — BP 130/84 | HR 60 | Temp 98.6°F | Resp 14 | Ht 64.0 in | Wt 157.0 lb

## 2017-01-05 DIAGNOSIS — Z8719 Personal history of other diseases of the digestive system: Secondary | ICD-10-CM | POA: Diagnosis not present

## 2017-01-05 DIAGNOSIS — K6389 Other specified diseases of intestine: Secondary | ICD-10-CM | POA: Diagnosis not present

## 2017-01-05 DIAGNOSIS — K5732 Diverticulitis of large intestine without perforation or abscess without bleeding: Secondary | ICD-10-CM | POA: Diagnosis not present

## 2017-01-05 DIAGNOSIS — R1032 Left lower quadrant pain: Secondary | ICD-10-CM | POA: Diagnosis not present

## 2017-01-05 MED ORDER — SODIUM CHLORIDE 0.9 % IV SOLN: 500.0000 mL | INTRAVENOUS | Status: DC

## 2017-01-05 NOTE — Patient Instructions (Signed)
Discharge instructions given. Handouts on Diverticulosis and Hemorrhoids. Avoid NSAIDS such as ibuprofen,naproxen, or other non-steroidal anti-inflammatory drugs. Resume previous medications. YOU HAD AN ENDOSCOPIC PROCEDURE TODAY AT [facility]:   Refer to the procedure report that was given to you for any specific questions about what was found during the examination.  If the procedure report does not answer your questions, please call your gastroenterologist to clarify.  If you requested that your care partner not be given the details of your procedure findings, then the procedure report has been included in a sealed envelope for you to review at your convenience later.  YOU SHOULD EXPECT: Some feelings of bloating in the abdomen. Passage of more gas than usual.  Walking can help get rid of the air that was put into your GI tract during the procedure and reduce the bloating. If you had a lower endoscopy (such as a colonoscopy or flexible sigmoidoscopy) you may notice spotting of blood in your stool or on the toilet paper. If you underwent a bowel prep for your procedure, you may not have a normal bowel movement for a few days.  Please Note:  You might notice some irritation and congestion in your nose or some drainage.  This is from the oxygen used during your procedure.  There is no need for concern and it should clear up in a day or so.  SYMPTOMS TO REPORT IMMEDIATELY:   Following lower endoscopy (colonoscopy or flexible sigmoidoscopy):  Excessive amounts of blood in the stool  Significant tenderness or worsening of abdominal pains  Swelling of the abdomen that is new, acute  Fever of 100F or higher   For urgent or emergent issues, a gastroenterologist can be reached at any hour by calling [phone].   DIET:  We do recommend a small meal at first, but then you may proceed to your regular diet.  Drink plenty of fluids but you should avoid alcoholic beverages for 24  hours.  ACTIVITY:  You should plan to take it easy for the rest of today and you should NOT DRIVE or use heavy machinery until tomorrow (because of the sedation medicines used during the test).    FOLLOW UP: Our staff will call the number listed on your records the next business day following your procedure to check on you and address any questions or concerns that you may have regarding the information given to you following your procedure. If we do not reach you, we will leave a message.  However, if you are feeling well and you are not experiencing any problems, there is no need to return our call.  We will assume that you have returned to your regular daily activities without incident.  If any biopsies were taken you will be contacted by phone or by letter within the next 1-3 weeks.  Please call us at [phone] if you have not heard about the biopsies in 3 weeks.    SIGNATURES/CONFIDENTIALITY: You and/or your care partner have signed paperwork which will be entered into your electronic medical record.  These signatures attest to the fact that that the information above on your After Visit Summary has been reviewed and is understood.  Full responsibility of the confidentiality of this discharge information lies with you and/or your care-partner.

## 2017-01-05 NOTE — Progress Notes (Signed)
Report given to PACU, vss 

## 2017-01-05 NOTE — Op Note (Signed)
[place] Patient Name: Tracy [name] Procedure Date: 01/05/2017 3:39 PM MRN: [id number] Endoscopist: Mauri Pole , MD Age: 66 Referring MD:  Date of Birth: [birth year] Gender: Female Account #: [id number] Procedure:                Colonoscopy Indications:              Follow-up of diverticulitis Medicines:                Monitored Anesthesia Care Procedure:                Pre-Anesthesia Assessment:                           - Prior to the procedure, a History and Physical                            was performed, and patient medications and                            allergies were reviewed. The patient's tolerance of                            previous anesthesia was also reviewed. The risks                            and benefits of the procedure and the sedation                            options and risks were discussed with the patient.                            All questions were answered, and informed consent                            was obtained. Prior Anticoagulants: The patient has                            taken no previous anticoagulant or antiplatelet                            agents. ASA Grade Assessment: II - A patient with                            mild systemic disease. After reviewing the risks                            and benefits, the patient was deemed in                            satisfactory condition to undergo the procedure.                           After obtaining informed consent, the colonoscope  was passed under direct vision. Throughout the                            procedure, the patient's blood pressure, pulse, and                            oxygen saturations were monitored continuously. The                            Model PCF-H190DL [id number]) scope was introduced                            through the anus and advanced to the the cecum,                            identified by appendiceal  orifice and ileocecal                            valve. The colonoscopy was performed without                            difficulty. The patient tolerated the procedure                            well. The quality of the bowel preparation was                            good. The ileocecal valve, appendiceal orifice, and                            rectum were photographed. Scope In: 4:00:02 PM Scope Out: 4:17:06 PM Scope Withdrawal Time: 0 hours 9 minutes 48 seconds  Total Procedure Duration: 0 hours 17 minutes 4 seconds  Findings:                 The perianal and digital rectal examinations were                            normal.                           The mucosa vascular pattern in the sigmoid colon,                            in the descending colon and at the splenic flexure                            was segmentally decreased. Biopsies were taken with                            a cold forceps for histology.                           Multiple small and large-mouthed diverticula were  found in the sigmoid colon, descending colon,                            transverse colon, ascending colon and cecum. There                            was narrowing of the colon in association with the                            diverticular opening. There was evidence of                            diverticular spasm. There was no evidence of                            diverticular bleeding.                           Non-bleeding internal hemorrhoids were found during                            retroflexion. The hemorrhoids were medium-sized.                           The exam was otherwise without abnormality. Complications:            No immediate complications. Estimated Blood Loss:     Estimated blood loss was minimal. 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 Biopsied.                           - Severe diverticulosis in the sigmoid colon, in                            the descending colon, in the transverse colon, in                            the ascending colon and in the cecum. There was                            narrowing of the colon in association with the                            diverticular opening. There was evidence of  diverticular spasm. There was no evidence of                            diverticular bleeding.                           - Non-bleeding internal hemorrhoids.                           - The examination was otherwise normal. Recommendation:           - Patient has a contact number available for                            emergencies. The signs and symptoms of potential                            delayed complications were discussed with the                            patient. Return to normal activities tomorrow.                            Written discharge instructions were provided to the                            patient.                           - Resume previous diet.                           - Continue present medications.                           - No ibuprofen, naproxen, or other non-steroidal                            anti-inflammatory drugs.                           - Await pathology results.                           - Repeat colonoscopy date to be determined after                            pending pathology results are reviewed for                            surveillance based on pathology results.                           - Return to GI clinic PRN. Mauri Pole, MD 01/05/2017 4:25:57 PM This report has been signed electronically.

## 2017-01-05 NOTE — Progress Notes (Signed)
Called to room to assist during endoscopic procedure.  Patient ID and intended procedure confirmed with present staff. Received instructions for my participation in the procedure from the performing physician.  

## 2017-01-06 ENCOUNTER — Telehealth: Payer: Self-pay | Admitting: *Deleted

## 2017-01-06 NOTE — Telephone Encounter (Signed)
  Follow up Call-  Call back number 01/05/2017  Post procedure Call Back phone  # [phone]  Permission to leave phone message Yes  Some recent data might be hidden     Patient questions:  Do you have a fever, pain , or abdominal swelling? No. Pain Score  0 *  Have you tolerated food without any problems? Yes.    Have you been able to return to your normal activities? Yes.    Do you have any questions about your discharge instructions: Diet   No. Medications  No. Follow up visit  No.  Do you have questions or concerns about your Care? No.  Actions: * If pain score is 4 or above: No action needed, pain <4.

## 2017-01-16 ENCOUNTER — Encounter: Payer: Self-pay | Admitting: Gastroenterology

## 2017-01-25 ENCOUNTER — Encounter: Payer: Self-pay | Admitting: Family Medicine

## 2017-01-25 ENCOUNTER — Other Ambulatory Visit (INDEPENDENT_AMBULATORY_CARE_PROVIDER_SITE_OTHER): Payer: Medicare Other

## 2017-01-25 DIAGNOSIS — E039 Hypothyroidism, unspecified: Secondary | ICD-10-CM | POA: Diagnosis not present

## 2017-01-25 DIAGNOSIS — E038 Other specified hypothyroidism: Secondary | ICD-10-CM

## 2017-01-25 LAB — TSH: TSH: 1.04 u[IU]/mL (ref 0.35–4.50)

## 2017-01-25 LAB — T4, FREE: Free T4: 1.3 ng/dL (ref 0.60–1.60)

## 2017-01-25 LAB — T3, FREE: T3 FREE: 3.1 pg/mL (ref 2.3–4.2)

## 2017-01-26 ENCOUNTER — Telehealth: Payer: Self-pay

## 2017-01-26 NOTE — Telephone Encounter (Signed)
Pt stated that she does not want to continue with the medication because she is having severe muscle and joint pain. She said that she feels that this all started when she started the medication and she would like to stop the medication and see if it clears up her pain. Please advise.

## 2017-01-26 NOTE — Telephone Encounter (Signed)
FYI: Scheduled patient for 30 min appointment with KT on Friday 01/27/17 @ 2:15p. She has spoke with Dr Cruzita Lederer office and they advised her to stop the Methimazole and have more testing completed. She is agreeable.

## 2017-01-26 NOTE — Telephone Encounter (Signed)
Ok. l will check her TFTs in 2-3 mo when she comes back

## 2017-01-26 NOTE — Telephone Encounter (Signed)
Pt is aware.  

## 2017-01-26 NOTE — Telephone Encounter (Signed)
-----   Message from [name], MD sent at 01/25/2017  6:36 PM EST ----- Loma Sousa, can you please call pt: Tracy [name] are normal. Let's schedule a new appt in 2-3 months - until then, we can continue the same dose of MMI (methimazole).

## 2017-01-27 ENCOUNTER — Other Ambulatory Visit: Payer: Self-pay

## 2017-01-27 ENCOUNTER — Ambulatory Visit: Payer: Medicare Other | Admitting: Family Medicine

## 2017-01-27 ENCOUNTER — Encounter: Payer: Self-pay | Admitting: Family Medicine

## 2017-01-27 VITALS — BP 121/81 | HR 79 | Temp 98.1°F | Resp 16 | Ht 64.0 in | Wt 160.2 lb

## 2017-01-27 DIAGNOSIS — F418 Other specified anxiety disorders: Secondary | ICD-10-CM

## 2017-01-27 DIAGNOSIS — M25512 Pain in left shoulder: Secondary | ICD-10-CM | POA: Diagnosis not present

## 2017-01-27 DIAGNOSIS — M25511 Pain in right shoulder: Secondary | ICD-10-CM | POA: Diagnosis not present

## 2017-01-27 LAB — SEDIMENTATION RATE: Sed Rate: 13 mm/hr (ref 0–30)

## 2017-01-27 MED ORDER — DULOXETINE HCL 20 MG PO CPEP: 20.0000 mg | ORAL_CAPSULE | Freq: Every day | ORAL | 3 refills | Status: DC

## 2017-01-27 NOTE — Progress Notes (Signed)
   Subjective:    Patient ID: Tracy [name], female    DOB: 06-Dec-1950, 66 y.o.   MRN: [id number]  HPI 'i'm tired of hurting'- 'it never stops'.  'you can pick a joint and it's just going to throb'.  Pt reports sxs x2 months.  Pt attributes her pain to the recent addition of Methimazole.  GI told her no more NSAIDs.  Taking Tylenol which is not effective.  Pt is walking every day for 30 minutes.  Went to Ortho and got shot in L knee 'and it's already wearing off'.  Pt reports bilateral shoulder pain, neck pain, back pain.  Feels the shoulders are particularly weak and she is having difficulty raising arms overhead or lifting things.  Known hx of large bone spur on R.  Pain is causing pt to be depressed.  She is crying frequently.   Review of Systems For ROS see HPI     Objective:   Physical Exam  Constitutional: She is oriented to person, place, and time. She appears well-developed and well-nourished.  Musculoskeletal: She exhibits tenderness (diffuse TTP over multiple trigger points and joints). She exhibits no edema or deformity.  Limited ROM of shoulders bilaterally, R>L  Neurological: She is alert and oriented to person, place, and time.  Skin: Skin is warm and dry.  Psychiatric: Her behavior is normal. Thought content normal.  Anxious, obviously upset  Vitals reviewed.         Assessment & Plan:  Bilateral shoulder pain- new to provider.  Pt has old xray showing large bone spur of R shoulder.  At that time, surgery was recommended but she declined.  Stressed that rotator cuff tendonopathy was much easier to fix than a cuff tear.  Will refer to Ortho for complete evaluation.  Given bilateral shoulder pain and weakness, will get ESR to r/o PMR.  Start cymbalta for both pain and anxiety/depression.  Will follow.

## 2017-01-27 NOTE — Assessment & Plan Note (Signed)
Deteriorated.  Pt is more depressed and anxious due to her ongoing pain and fatigue caused by poor sleep.  She is not currently on any SSRIs or other depression meds.  After a discussion about pain and mood, and her + trigger points, will start low dose Cymbalta and titrate prn. Pt expressed understanding and is in agreement w/ plan.

## 2017-01-27 NOTE — Patient Instructions (Signed)
Follow up in 1 month to recheck mood and pain We'll notify you of your lab results and make any changes if needed We'll call you with your Ortho appt Try and remain active to avoid additional stiffness Drink plenty of water and increase your potassium intake (bananas, citrus fruits, leafy greens, mustard) to prevent leg cramps Call with any questions or concerns Hang in there! Happy Holidays!!!

## 2017-01-30 LAB — ANA: ANA: NEGATIVE

## 2017-02-24 DIAGNOSIS — M19012 Primary osteoarthritis, left shoulder: Secondary | ICD-10-CM | POA: Diagnosis not present

## 2017-02-24 DIAGNOSIS — M19011 Primary osteoarthritis, right shoulder: Secondary | ICD-10-CM | POA: Diagnosis not present

## 2017-03-01 ENCOUNTER — Ambulatory Visit: Payer: Medicare Other | Admitting: Family Medicine

## 2017-03-01 ENCOUNTER — Other Ambulatory Visit: Payer: Self-pay

## 2017-03-01 ENCOUNTER — Encounter: Payer: Self-pay | Admitting: Family Medicine

## 2017-03-01 VITALS — BP 126/82 | HR 80 | Temp 98.5°F | Resp 16 | Ht 64.0 in | Wt 156.5 lb

## 2017-03-01 DIAGNOSIS — F418 Other specified anxiety disorders: Secondary | ICD-10-CM

## 2017-03-01 DIAGNOSIS — M19019 Primary osteoarthritis, unspecified shoulder: Secondary | ICD-10-CM | POA: Diagnosis not present

## 2017-03-01 DIAGNOSIS — E0590 Thyrotoxicosis, unspecified without thyrotoxic crisis or storm: Secondary | ICD-10-CM

## 2017-03-01 LAB — T3, FREE: T3 FREE: 3.3 pg/mL (ref 2.3–4.2)

## 2017-03-01 LAB — TSH: TSH: 0.22 u[IU]/mL — AB (ref 0.35–4.50)

## 2017-03-01 LAB — T4, FREE: Free T4: 1.27 ng/dL (ref 0.60–1.60)

## 2017-03-01 NOTE — Assessment & Plan Note (Signed)
Pt has been following w/ Dr Cruzita Lederer but doesn't feel that they have a good connection.  Had to stop her Methimazole due to side effects.  Asking to repeat labs and determine the next steps.

## 2017-03-01 NOTE — Patient Instructions (Signed)
We'll notify you of your lab results and determine the next step Before you get your next Cymbalta refill, let me know if you are interested in increasing to 30mg  daily Keep up the good work!  You look great! Call with any questions or concerns Happy New Year!

## 2017-03-01 NOTE — Assessment & Plan Note (Signed)
Chronic problem.  Seeing Ortho.  Getting steroid shots to temporize pain until she gets a shoulder replacement.  Continue Cymbalta daily for added pain relief.

## 2017-03-01 NOTE — Assessment & Plan Note (Signed)
Much improved since starting Cymbalta but continues to have some anxiety.  She just got a refill on the 20mg  dose so she will let me know prior to her next refill if she wants to increase to 30mg  daily.  Pt expressed understanding and is in agreement w/ plan.

## 2017-03-01 NOTE — Progress Notes (Signed)
   Subjective:    Patient ID: Tracy [name], female    DOB: Jan 17, 1951, 67 y.o.   MRN: [id number]  HPI Depression/anxiety- pt was started on Cymbalta at last visit and reports she's 'doing much better than when I was here last time'.  Pt reports she still has 'some anxiety'- particularly over downsizing and big life changes.  Shoulder pain- pt saw Dr Theda Sers and was told that she needs a R shoulder replacement.  In the meantime, they started Cortisone injxns in each shoulder.  Shot was 2 weeks ago and has had some relief.  Plans on putting off shoulder surgery 'as long as possible'.  Hyperthyroid- pt stopped her methimazole due to side effects.   Review of Systems For ROS see HPI     Objective:   Physical Exam  Constitutional: She is oriented to person, place, and time. She appears well-developed and well-nourished. No distress.  HENT:  Head: Normocephalic and atraumatic.  Eyes: Conjunctivae and EOM are normal. Pupils are equal, round, and reactive to light.  Neck: Normal range of motion. Neck supple. No thyromegaly present.  Cardiovascular: Normal rate, regular rhythm, normal heart sounds and intact distal pulses.  No murmur heard. Pulmonary/Chest: Effort normal and breath sounds normal. No respiratory distress.  Abdominal: Soft. She exhibits no distension. There is no tenderness.  Musculoskeletal: She exhibits no edema.  Lymphadenopathy:    She has no cervical adenopathy.  Neurological: She is alert and oriented to person, place, and time.  Skin: Skin is warm and dry.  Psychiatric: She has a normal mood and affect. Her behavior is normal.  Vitals reviewed.         Assessment & Plan:

## 2017-03-20 DIAGNOSIS — H35363 Drusen (degenerative) of macula, bilateral: Secondary | ICD-10-CM | POA: Diagnosis not present

## 2017-03-20 DIAGNOSIS — H04123 Dry eye syndrome of bilateral lacrimal glands: Secondary | ICD-10-CM | POA: Diagnosis not present

## 2017-03-20 DIAGNOSIS — H25013 Cortical age-related cataract, bilateral: Secondary | ICD-10-CM | POA: Diagnosis not present

## 2017-03-20 DIAGNOSIS — H2513 Age-related nuclear cataract, bilateral: Secondary | ICD-10-CM | POA: Diagnosis not present

## 2017-04-05 ENCOUNTER — Other Ambulatory Visit: Payer: Self-pay | Admitting: Family Medicine

## 2017-05-11 ENCOUNTER — Other Ambulatory Visit: Payer: Self-pay | Admitting: Family Medicine

## 2017-05-11 DIAGNOSIS — D225 Melanocytic nevi of trunk: Secondary | ICD-10-CM | POA: Diagnosis not present

## 2017-05-11 DIAGNOSIS — C44519 Basal cell carcinoma of skin of other part of trunk: Secondary | ICD-10-CM | POA: Diagnosis not present

## 2017-05-11 DIAGNOSIS — D1801 Hemangioma of skin and subcutaneous tissue: Secondary | ICD-10-CM | POA: Diagnosis not present

## 2017-05-11 DIAGNOSIS — Z85828 Personal history of other malignant neoplasm of skin: Secondary | ICD-10-CM | POA: Diagnosis not present

## 2017-05-11 DIAGNOSIS — Z1239 Encounter for other screening for malignant neoplasm of breast: Secondary | ICD-10-CM

## 2017-05-11 DIAGNOSIS — L814 Other melanin hyperpigmentation: Secondary | ICD-10-CM | POA: Diagnosis not present

## 2017-05-11 DIAGNOSIS — L821 Other seborrheic keratosis: Secondary | ICD-10-CM | POA: Diagnosis not present

## 2017-05-15 DIAGNOSIS — M25511 Pain in right shoulder: Secondary | ICD-10-CM | POA: Diagnosis not present

## 2017-05-15 DIAGNOSIS — M25512 Pain in left shoulder: Secondary | ICD-10-CM | POA: Diagnosis not present

## 2017-05-16 ENCOUNTER — Other Ambulatory Visit: Payer: Self-pay | Admitting: Family Medicine

## 2017-05-16 DIAGNOSIS — Z1231 Encounter for screening mammogram for malignant neoplasm of breast: Secondary | ICD-10-CM | POA: Diagnosis not present

## 2017-05-16 DIAGNOSIS — Z780 Asymptomatic menopausal state: Secondary | ICD-10-CM | POA: Diagnosis not present

## 2017-05-16 DIAGNOSIS — M810 Age-related osteoporosis without current pathological fracture: Secondary | ICD-10-CM | POA: Diagnosis not present

## 2017-05-16 LAB — HM MAMMOGRAPHY

## 2017-05-16 LAB — HM DEXA SCAN

## 2017-05-17 ENCOUNTER — Encounter: Payer: Self-pay | Admitting: Family Medicine

## 2017-05-22 ENCOUNTER — Encounter: Payer: Self-pay | Admitting: Family Medicine

## 2017-05-22 DIAGNOSIS — M25511 Pain in right shoulder: Secondary | ICD-10-CM | POA: Diagnosis not present

## 2017-05-22 DIAGNOSIS — M25512 Pain in left shoulder: Secondary | ICD-10-CM | POA: Diagnosis not present

## 2017-05-23 ENCOUNTER — Encounter: Payer: Self-pay | Admitting: General Practice

## 2017-05-25 ENCOUNTER — Encounter: Payer: Self-pay | Admitting: Family Medicine

## 2017-05-31 ENCOUNTER — Encounter: Payer: Self-pay | Admitting: Family Medicine

## 2017-05-31 DIAGNOSIS — M25812 Other specified joint disorders, left shoulder: Secondary | ICD-10-CM | POA: Diagnosis not present

## 2017-05-31 DIAGNOSIS — M19012 Primary osteoarthritis, left shoulder: Secondary | ICD-10-CM | POA: Diagnosis not present

## 2017-05-31 DIAGNOSIS — M25811 Other specified joint disorders, right shoulder: Secondary | ICD-10-CM | POA: Diagnosis not present

## 2017-05-31 DIAGNOSIS — M19011 Primary osteoarthritis, right shoulder: Secondary | ICD-10-CM | POA: Diagnosis not present

## 2017-06-01 ENCOUNTER — Other Ambulatory Visit (INDEPENDENT_AMBULATORY_CARE_PROVIDER_SITE_OTHER): Payer: Medicare Other

## 2017-06-01 ENCOUNTER — Other Ambulatory Visit: Payer: Self-pay

## 2017-06-01 ENCOUNTER — Ambulatory Visit: Payer: Medicare Other

## 2017-06-01 ENCOUNTER — Encounter: Payer: Self-pay | Admitting: Family Medicine

## 2017-06-01 ENCOUNTER — Ambulatory Visit: Payer: Medicare Other | Admitting: Family Medicine

## 2017-06-01 VITALS — BP 122/81 | HR 81 | Temp 98.0°F | Resp 16 | Ht 64.0 in | Wt 162.0 lb

## 2017-06-01 DIAGNOSIS — Z0000 Encounter for general adult medical examination without abnormal findings: Secondary | ICD-10-CM | POA: Diagnosis not present

## 2017-06-01 DIAGNOSIS — R7989 Other specified abnormal findings of blood chemistry: Secondary | ICD-10-CM | POA: Diagnosis not present

## 2017-06-01 DIAGNOSIS — M8580 Other specified disorders of bone density and structure, unspecified site: Secondary | ICD-10-CM

## 2017-06-01 DIAGNOSIS — R0683 Snoring: Secondary | ICD-10-CM

## 2017-06-01 DIAGNOSIS — E781 Pure hyperglyceridemia: Secondary | ICD-10-CM | POA: Diagnosis not present

## 2017-06-01 DIAGNOSIS — M810 Age-related osteoporosis without current pathological fracture: Secondary | ICD-10-CM | POA: Diagnosis not present

## 2017-06-01 LAB — POCT URINALYSIS DIPSTICK
Bilirubin, UA: NEGATIVE
Blood, UA: NEGATIVE
GLUCOSE UA: NEGATIVE
Ketones, UA: NEGATIVE
LEUKOCYTES UA: NEGATIVE
Nitrite, UA: NEGATIVE
Protein, UA: NEGATIVE
Spec Grav, UA: >=1.03 — AB (ref 1.010–1.025)
Urobilinogen, UA: 0.2 E.U./dL
pH, UA: 5.5 (ref 5.0–8.0)

## 2017-06-01 LAB — CBC WITH DIFFERENTIAL/PLATELET
BASOS PCT: 0.8 % (ref 0.0–3.0)
Basophils Absolute: 0.1 10*3/uL (ref 0.0–0.1)
EOS PCT: 2.5 % (ref 0.0–5.0)
Eosinophils Absolute: 0.2 10*3/uL (ref 0.0–0.7)
HEMATOCRIT: 43.2 % (ref 36.0–46.0)
HEMOGLOBIN: 14.6 g/dL (ref 12.0–15.0)
Lymphocytes Relative: 18.5 % (ref 12.0–46.0)
Lymphs Abs: 1.5 10*3/uL (ref 0.7–4.0)
MCHC: 33.7 g/dL (ref 30.0–36.0)
MCV: 90.5 fl (ref 78.0–100.0)
MONO ABS: 0.9 10*3/uL (ref 0.1–1.0)
Monocytes Relative: 11.9 % (ref 3.0–12.0)
NEUTROS ABS: 5.2 10*3/uL (ref 1.4–7.7)
Neutrophils Relative %: 66.3 % (ref 43.0–77.0)
PLATELETS: 287 10*3/uL (ref 150.0–400.0)
RBC: 4.78 Mil/uL (ref 3.87–5.11)
RDW: 12.9 % (ref 11.5–15.5)
WBC: 7.9 10*3/uL (ref 4.0–10.5)

## 2017-06-01 LAB — HEPATIC FUNCTION PANEL
ALT: 16 U/L (ref 0–35)
AST: 23 U/L (ref 0–37)
Albumin: 4.3 g/dL (ref 3.5–5.2)
Alkaline Phosphatase: 69 U/L (ref 39–117)
BILIRUBIN DIRECT: 0.1 mg/dL (ref 0.0–0.3)
BILIRUBIN TOTAL: 0.5 mg/dL (ref 0.2–1.2)
Total Protein: 7.3 g/dL (ref 6.0–8.3)

## 2017-06-01 LAB — BASIC METABOLIC PANEL
BUN: 26 mg/dL — ABNORMAL HIGH (ref 6–23)
CHLORIDE: 105 meq/L (ref 96–112)
CO2: 30 meq/L (ref 19–32)
CREATININE: 0.94 mg/dL (ref 0.40–1.20)
Calcium: 10.3 mg/dL (ref 8.4–10.5)
GFR: 62.72 mL/min (ref >60.00)
Glucose, Bld: 103 mg/dL — ABNORMAL HIGH (ref 70–99)
POTASSIUM: 4.5 meq/L (ref 3.5–5.1)
SODIUM: 141 meq/L (ref 135–145)

## 2017-06-01 LAB — LIPID PANEL
Cholesterol: 188 mg/dL (ref 0–200)
HDL: 74.8 mg/dL (ref >39.00)
LDL Cholesterol: 97 mg/dL (ref 0–99)
NonHDL: 112.7
TRIGLYCERIDES: 78 mg/dL (ref 0.0–149.0)
Total CHOL/HDL Ratio: 3
VLDL: 15.6 mg/dL (ref 0.0–40.0)

## 2017-06-01 LAB — TSH: TSH: 0.15 u[IU]/mL — AB (ref 0.35–4.50)

## 2017-06-01 LAB — VITAMIN D 25 HYDROXY (VIT D DEFICIENCY, FRACTURES): VITD: 56.91 ng/mL (ref 30.00–100.00)

## 2017-06-01 LAB — T3, FREE: T3, Free: 3.4 pg/mL (ref 2.3–4.2)

## 2017-06-01 LAB — T4, FREE: FREE T4: 1.02 ng/dL (ref 0.60–1.60)

## 2017-06-01 NOTE — Assessment & Plan Note (Signed)
Pt's PE unchanged from previous.  Very limited mobility in R shoulder, impaired mobility in L shoulder.  UTD on colonoscopy, mammo, DEXA, immunizations.  EKG done today as part of CPE and surgical clearance.  Check labs.  Anticipatory guidance provided.

## 2017-06-01 NOTE — Assessment & Plan Note (Signed)
Pt has been refusing treatment for years after a bad experience with Fosamax.  Reviewed her DEXA with her and encouraged her to consider Prolia injections.  She can also discuss this with her orthopedist.  Will follow.

## 2017-06-01 NOTE — Assessment & Plan Note (Signed)
Chronic problem.  On Fenofibrate daily.  Unable to exercise due to major orthopedic issues.  Discussed need for healthy diet.  Check labs.  Adjust meds prn

## 2017-06-01 NOTE — Progress Notes (Signed)
   Subjective:    Patient ID: Tracy [name], female    DOB: [birth year], 67 y.o.   MRN: [id number]  HPI CPE- UTD on colonoscopy, mammo, DEXA, immunizations.   Review of Systems Patient reports no vision/ hearing changes, adenopathy,fever, weight change,  persistant/recurrent hoarseness , swallowing issues, chest pain, palpitations, edema, persistant/recurrent cough, hemoptysis, gastrointestinal bleeding (melena, rectal bleeding), abdominal pain, significant heartburn, bowel changes, GU symptoms (dysuria, hematuria, incontinence), Gyn symptoms (abnormal  bleeding, pain),  syncope, focal weakness, memory loss, numbness & tingling, skin/hair/nail changes, abnormal bruising or bleeding, anxiety, or depression.   + snoring w/ reported apneic events + SOB w/ exertion    Objective:   Physical Exam General Appearance:    Alert, cooperative, no distress, appears stated age  Head:    Normocephalic, without obvious abnormality, atraumatic  Eyes:    PERRL, conjunctiva/corneas clear, EOM's intact, fundi    benign, both eyes  Ears:    Normal TM's and external ear canals, both ears  Nose:   Nares normal, septum midline, mucosa normal, no drainage    or sinus tenderness  Throat:   Lips, mucosa, and tongue normal; teeth and gums normal  Neck:   Supple, symmetrical, trachea midline, no adenopathy;    Thyroid: no enlargement/tenderness/nodules  Back:     Symmetric, no curvature, ROM normal, no CVA tenderness  Lungs:     Clear to auscultation bilaterally, respirations unlabored  Chest Wall:    No tenderness or deformity   Heart:    Regular rate and rhythm, S1 and S2 normal, no murmur, rub   or gallop  Breast Exam:    Deferred to mammo  Abdomen:     Soft, non-tender, bowel sounds active all four quadrants,    no masses, no organomegaly  Genitalia:    Deferred  Rectal:    Extremities:   Extremities normal, atraumatic, no cyanosis or edema  Pulses:   2+ and symmetric all extremities  Skin:   Skin  color, texture, turgor normal, no rashes or lesions  Lymph nodes:   Cervical, supraclavicular, and axillary nodes normal  Neurologic:   CNII-XII intact          Assessment & Plan:

## 2017-06-01 NOTE — Patient Instructions (Signed)
Follow up in 6 months to recheck cholesterol We'll notify you of your lab results and make any changes if needed We'll call you with your pulmonary appt for sleep apnea evaluation Continue to work on healthy diet and walk as you are able to improve your stamina ADD daily Claritin or Zyrtec to your Flonase Consider starting Prolia injections every 6 months for osteoporosis treatment Call with any questions or concerns GOOD LUCK WITH SURGERY!

## 2017-06-07 ENCOUNTER — Encounter: Payer: Self-pay | Admitting: Family Medicine

## 2017-06-16 NOTE — Pre-Procedure Instructions (Signed)
FARDOWSA AUTHIER  06/16/2017      [facility] - [place], [address] AT [facility] [address] [place] [address] Phone: [phone] Fax: [phone]  [place], [place] [facility] [address] [place] [address] [place] [address] Phone: [phone] Fax: [phone]    Your procedure is scheduled on Friday April 26.  Report to [facility] Admitting at 11:30 A.M.  Call this number if you have problems the morning of surgery:  [phone]   Remember:  Do not eat food or drink liquids after midnight.  Take these medicines the morning of surgery with A SIP OF WATER:   Pantoprazole (Protonix) Loratadine (claritin) if needed  Flonase if needed  7 days prior to surgery STOP taking any Aspirin(unless otherwise instructed by your surgeon), Aleve, Naproxen, Ibuprofen, Motrin, Advil, Goody's, BC's, all herbal medications, fish oil, and all vitamins    Do not wear jewelry, make-up or nail polish.  Do not wear lotions, powders, or perfumes, or deodorant.  Do not shave 48 hours prior to surgery.  Men may shave face and neck.  Do not bring valuables to the hospital.  [facility] is not responsible for any belongings or valuables.  Contacts, dentures or bridgework may not be worn into surgery.  Leave your suitcase in the car.  After surgery it may be brought to your room.  For patients admitted to the hospital, discharge time will be determined by your treatment team.  Patients discharged the day of surgery will not be allowed to drive home.   Special instructions:    [place]- Preparing For Surgery  Before surgery, you can play an important role. Because skin is not sterile, your skin needs to be as free of germs as possible. You can reduce the number of germs on your skin by washing with CHG (chlorahexidine gluconate) Soap before surgery.  CHG is an antiseptic cleaner  which kills germs and bonds with the skin to continue killing germs even after washing.  Please do not use if you have an allergy to CHG or antibacterial soaps. If your skin becomes reddened/irritated stop using the CHG.  Do not shave (including legs and underarms) for at least 48 hours prior to first CHG shower. It is OK to shave your face.  Please follow these instructions carefully.   1. Shower the NIGHT BEFORE SURGERY and the MORNING OF SURGERY with CHG.   2. If you chose to wash your hair, wash your hair first as usual with your normal shampoo.  3. After you shampoo, rinse your hair and body thoroughly to remove the shampoo.  4. Use CHG as you would any other liquid soap. You can apply CHG directly to the skin and wash gently with a scrungie or a clean washcloth.   5. Apply the CHG Soap to your body ONLY FROM THE NECK DOWN.  Do not use on open wounds or open sores. Avoid contact with your eyes, ears, mouth and genitals (private parts). Wash Face and genitals (private parts)  with your normal soap.  6. Wash thoroughly, paying special attention to the area where your surgery will be performed.  7. Thoroughly rinse your body with warm water from the neck down.  8. DO NOT shower/wash with your normal soap after using and rinsing off the CHG Soap.  9. Pat yourself dry with a CLEAN TOWEL.  10. Wear CLEAN PAJAMAS to bed the night before surgery, wear comfortable clothes the morning of surgery  11. Place CLEAN SHEETS on your bed the night of your first shower and DO NOT SLEEP WITH PETS.    Day of Surgery: Do not apply any deodorants/lotions. Please wear clean clothes to the hospital/surgery center.      Please read over the following fact sheets that you were given. Coughing and Deep Breathing, Total Joint Packet, MRSA Information and Surgical Site Infection Prevention

## 2017-06-18 NOTE — H&P (Signed)
Tracy [name] is an 67 y.o. female.    Chief Complaint: right shoulder pain  HPI: Pt is a 67 y.o. female complaining of right shoulder pain for multiple years. Pain had continually increased since the beginning. X-rays in the clinic show end-stage arthritic changes of the right shoulder. Pt has tried various conservative treatments which have failed to alleviate their symptoms, including injections and therapy. Various options are discussed with the patient. Risks, benefits and expectations were discussed with the patient. Patient understand the risks, benefits and expectations and wishes to proceed with surgery.   PCP:  [name], MD  D/C Plans: Home  PMH: Past Medical History:  Diagnosis Date  . Allergy   . Anxiety   . Arthritis   . Bursitis of left hip   . C. difficile diarrhea   . Cancer (HCC)    basil cell  . Cataract    bilateral  . Complication of anesthesia   . Diverticulitis   . GERD (gastroesophageal reflux disease)   . Hx: UTI (urinary tract infection)   . Hyperlipidemia   . Hyperthyroidism   . Osteopenia   . Osteoporosis   . PONV (postoperative nausea and vomiting)   . Rosacea     PSH: Past Surgical History:  Procedure Laterality Date  . APPENDECTOMY    . BACK SURGERY    . CHOLECYSTECTOMY    . FOOT SURGERY Right    X 2  . KNEE SURGERY    . LAPAROSCOPIC LIVER CYST FENESTRATION    . OPEN SURGICAL REPAIR OF GLUTEAL TENDON Left 04/01/2014   Procedure: LEFT HIP BURSECTOMY WITH GLUTEAL TENDON REPAIR;  Surgeon: Gearlean Alf, MD;  Location: [facility];  Service: Orthopedics;  Laterality: Left;  . TONSILLECTOMY    . TOTAL KNEE ARTHROPLASTY Right 08/17/2015   Procedure: RIGHT TOTAL KNEE ARTHROPLASTY;  Surgeon: Gaynelle Arabian, MD;  Location: [facility];  Service: Orthopedics;  Laterality: Right;  . TUBAL LIGATION      Social History:  reports that she has never smoked. She has never used smokeless tobacco. She reports that she does not drink alcohol or  use drugs.  Allergies:  Allergies  Allergen Reactions  . Demerol [Meperidine] Nausea And Vomiting  . Other Itching    Nylon sheets, pt prefers cotton sheets   . Tramadol Nausea And Vomiting  . Vicodin [Hydrocodone-Acetaminophen] Other (See Comments)    hallucinations  . Latex Rash and Other (See Comments)    Reaction:  Blisters     Medications: No current facility-administered medications for this encounter.    Current Outpatient Medications  Medication Sig Dispense Refill  . Calcium Carbonate-Vitamin D (CALCIUM 600+D PO) Take 1 tablet by mouth 2 (two) times daily.    . Cholecalciferol (VITAMIN D) 2000 units tablet Take 2,000 Units by mouth daily.    Marland Kitchen doxycycline (VIBRAMYCIN) 100 MG capsule Take 100 mg by mouth 2 (two) times daily as needed (rosacea).    . fenofibrate 160 MG tablet TAKE 1 TABLET BY MOUTH  DAILY 90 tablet 1  . fluticasone (FLONASE) 50 MCG/ACT nasal spray USE 1 SPRAY INTO BOTH  NOSTRILS DAILY. (Patient taking differently: Use 2 sprays in each nostril once daily at night) 32 g 6  . loratadine (CLARITIN) 10 MG tablet Take 10 mg by mouth daily as needed for allergies.    . Melatonin 5 MG TABS Take 5 mg by mouth at bedtime.    . pantoprazole (PROTONIX) 40 MG tablet TAKE 1 TABLET BY  MOUTH TWO  TIMES DAILY 180 tablet 0  . polyethylene glycol (MIRALAX / GLYCOLAX) packet Take 17 g by mouth daily. 14 each 0  . Polyethylene Glycol 400 (BLINK TEARS OP) Place 1 drop into both eyes daily as needed (dry eyes).    . DULoxetine (CYMBALTA) 20 MG capsule Take 1 capsule (20 mg total) by mouth daily. (Patient not taking: Reported on 06/14/2017) 30 capsule 3  . Multiple Vitamin (MULTIVITAMIN WITH MINERALS) TABS tablet Take 1 tablet by mouth daily.      No results found for this or any previous visit (from the past 48 hour(s)). No results found.  ROS: Pain with rom of the right upper extremity  Physical Exam:  Alert and oriented 67 y.o. female in no acute distress Cranial nerves  2-12 intact Cervical spine: full rom with no tenderness, nv intact distally Chest: active breath sounds bilaterally, no wheeze rhonchi or rales Heart: regular rate and rhythm, no murmur Abd: non tender non distended with active bowel sounds Hip is stable with rom  Right shoulder with moderate limitation with rom and weakness with ER/IR  Assessment/Plan Assessment: right shoulder cuff arthropathy  Plan: Patient will undergo a right reverse total shoulder by Dr. Veverly Fells at [facility]. Risks benefits and expectations were discussed with the patient. Patient understand risks, benefits and expectations and wishes to proceed.  Merla Riches PA-C, MPAS [facility] is now [employer] [address], [place], [place], [place] [address] Phone: [phone] [email or web address] Facebook  [employer]

## 2017-06-19 ENCOUNTER — Other Ambulatory Visit: Payer: Self-pay

## 2017-06-19 ENCOUNTER — Encounter (HOSPITAL_COMMUNITY): Payer: Self-pay

## 2017-06-19 ENCOUNTER — Encounter (HOSPITAL_COMMUNITY)
Admission: RE | Admit: 2017-06-19 | Discharge: 2017-06-19 | Disposition: A | Discharge status: Home / Self Care | Payer: Medicare Other | Source: Ambulatory Visit | Attending: Orthopedic Surgery | Admitting: Orthopedic Surgery

## 2017-06-19 DIAGNOSIS — Z01812 Encounter for preprocedural laboratory examination: Secondary | ICD-10-CM | POA: Diagnosis not present

## 2017-06-19 HISTORY — DX: Other specific arthropathies, not elsewhere classified, left shoulder: M12.812

## 2017-06-19 LAB — CBC
HEMATOCRIT: 42.5 % (ref 36.0–46.0)
HEMOGLOBIN: 14.2 g/dL (ref 12.0–15.0)
MCH: 30.6 pg (ref 26.0–34.0)
MCHC: 33.4 g/dL (ref 30.0–36.0)
MCV: 91.6 fL (ref 78.0–100.0)
Platelets: 243 10*3/uL (ref 150–400)
RBC: 4.64 MIL/uL (ref 3.87–5.11)
RDW: 12.8 % (ref 11.5–15.5)
WBC: 4.9 10*3/uL (ref 4.0–10.5)

## 2017-06-19 LAB — BASIC METABOLIC PANEL
ANION GAP: 8 (ref 5–15)
BUN: 17 mg/dL (ref 6–20)
CHLORIDE: 104 mmol/L (ref 101–111)
CO2: 25 mmol/L (ref 22–32)
Calcium: 9.8 mg/dL (ref 8.9–10.3)
Creatinine, Ser: 0.87 mg/dL (ref 0.44–1.00)
GFR calc non Af Amer: >60 mL/min (ref >60)
Glucose, Bld: 88 mg/dL (ref 65–99)
POTASSIUM: 4.1 mmol/L (ref 3.5–5.1)
Sodium: 137 mmol/L (ref 135–145)

## 2017-06-19 LAB — SURGICAL PCR SCREEN
MRSA, PCR: NEGATIVE
Staphylococcus aureus: POSITIVE — AB

## 2017-06-19 NOTE — Progress Notes (Signed)
Pt made aware of the Positive Staph result from the PCR screen. Prescription also called to the [facility].

## 2017-06-19 NOTE — Progress Notes (Signed)
PCP - Dr. Belenda Cruise T.  Cardiologist - Denies  Chest x-ray - Denies  EKG - 06/11/17 (E)  Stress Test - Denies  ECHO - Denies  Cardiac Cath - Denies  Sleep Study - Denies- Scheduled for 07/31/17 CPAP - None  LABS- 06/19/17: CBC, BMP  Anesthesia- No  Pt denies having chest pain, sob, or fever at this time. All instructions explained to the pt, with a verbal understanding of the material. Pt agrees to go over the instructions while at home for a better understanding. The opportunity to ask questions was provided.

## 2017-06-22 ENCOUNTER — Telehealth: Payer: Self-pay | Admitting: Family Medicine

## 2017-06-22 NOTE — Telephone Encounter (Signed)
Spoke with Tracy [name] she stated that she will give office a call back when she is ready to schedule her wellness appt. SF

## 2017-06-23 ENCOUNTER — Inpatient Hospital Stay (HOSPITAL_COMMUNITY)
Admission: RE | Admit: 2017-06-23 | Discharge: 2017-06-24 | DRG: 483 | Disposition: A | Discharge status: Home / Self Care | Payer: Medicare Other | Attending: Orthopedic Surgery | Admitting: Orthopedic Surgery

## 2017-06-23 ENCOUNTER — Inpatient Hospital Stay (HOSPITAL_COMMUNITY): Payer: Medicare Other | Admitting: Anesthesiology

## 2017-06-23 ENCOUNTER — Encounter (HOSPITAL_COMMUNITY)
Admission: RE | Disposition: A | Discharge status: Home / Self Care | Payer: Self-pay | Source: Home / Self Care | Attending: Orthopedic Surgery

## 2017-06-23 ENCOUNTER — Other Ambulatory Visit: Payer: Self-pay

## 2017-06-23 ENCOUNTER — Inpatient Hospital Stay (HOSPITAL_COMMUNITY): Payer: Medicare Other

## 2017-06-23 ENCOUNTER — Encounter (HOSPITAL_COMMUNITY): Payer: Self-pay | Admitting: Anesthesiology

## 2017-06-23 DIAGNOSIS — Z91048 Other nonmedicinal substance allergy status: Secondary | ICD-10-CM

## 2017-06-23 DIAGNOSIS — Z79899 Other long term (current) drug therapy: Secondary | ICD-10-CM

## 2017-06-23 DIAGNOSIS — Z888 Allergy status to other drugs, medicaments and biological substances status: Secondary | ICD-10-CM | POA: Diagnosis not present

## 2017-06-23 DIAGNOSIS — Z96651 Presence of right artificial knee joint: Secondary | ICD-10-CM | POA: Diagnosis not present

## 2017-06-23 DIAGNOSIS — Z885 Allergy status to narcotic agent status: Secondary | ICD-10-CM

## 2017-06-23 DIAGNOSIS — Z91040 Latex allergy status: Secondary | ICD-10-CM

## 2017-06-23 DIAGNOSIS — Z87440 Personal history of urinary (tract) infections: Secondary | ICD-10-CM | POA: Diagnosis not present

## 2017-06-23 DIAGNOSIS — E0590 Thyrotoxicosis, unspecified without thyrotoxic crisis or storm: Secondary | ICD-10-CM | POA: Diagnosis present

## 2017-06-23 DIAGNOSIS — M75101 Unspecified rotator cuff tear or rupture of right shoulder, not specified as traumatic: Secondary | ICD-10-CM | POA: Diagnosis not present

## 2017-06-23 DIAGNOSIS — M19011 Primary osteoarthritis, right shoulder: Principal | ICD-10-CM | POA: Diagnosis present

## 2017-06-23 DIAGNOSIS — M85811 Other specified disorders of bone density and structure, right shoulder: Secondary | ICD-10-CM | POA: Diagnosis present

## 2017-06-23 DIAGNOSIS — M25511 Pain in right shoulder: Secondary | ICD-10-CM | POA: Diagnosis present

## 2017-06-23 DIAGNOSIS — K219 Gastro-esophageal reflux disease without esophagitis: Secondary | ICD-10-CM | POA: Diagnosis not present

## 2017-06-23 DIAGNOSIS — L719 Rosacea, unspecified: Secondary | ICD-10-CM | POA: Diagnosis not present

## 2017-06-23 DIAGNOSIS — Z85828 Personal history of other malignant neoplasm of skin: Secondary | ICD-10-CM | POA: Diagnosis not present

## 2017-06-23 DIAGNOSIS — Z96611 Presence of right artificial shoulder joint: Secondary | ICD-10-CM

## 2017-06-23 DIAGNOSIS — M12811 Other specific arthropathies, not elsewhere classified, right shoulder: Secondary | ICD-10-CM | POA: Diagnosis not present

## 2017-06-23 DIAGNOSIS — E785 Hyperlipidemia, unspecified: Secondary | ICD-10-CM | POA: Diagnosis present

## 2017-06-23 DIAGNOSIS — G8918 Other acute postprocedural pain: Secondary | ICD-10-CM | POA: Diagnosis not present

## 2017-06-23 DIAGNOSIS — F419 Anxiety disorder, unspecified: Secondary | ICD-10-CM | POA: Diagnosis present

## 2017-06-23 DIAGNOSIS — Z471 Aftercare following joint replacement surgery: Secondary | ICD-10-CM | POA: Diagnosis not present

## 2017-06-23 HISTORY — PX: REVERSE SHOULDER ARTHROPLASTY: SHX5054

## 2017-06-23 SURGERY — ARTHROPLASTY, SHOULDER, TOTAL, REVERSE
Anesthesia: General | Site: Shoulder | Laterality: Right

## 2017-06-23 MED ORDER — FENOFIBRATE 160 MG PO TABS
160.0000 mg | ORAL_TABLET | Freq: Every day | ORAL | Status: DC
  Administered 2017-06-24: 160 mg via ORAL
  Filled 2017-06-23: qty 1

## 2017-06-23 MED ORDER — ONDANSETRON HCL 4 MG/2ML IJ SOLN: 4.0000 mg | Freq: Four times a day (QID) | INTRAMUSCULAR | Status: DC | PRN

## 2017-06-23 MED ORDER — MIDAZOLAM HCL 2 MG/2ML IJ SOLN
2.0000 mg | Freq: Once | INTRAMUSCULAR | Status: AC
  Administered 2017-06-23: 2 mg via INTRAVENOUS

## 2017-06-23 MED ORDER — ADULT MULTIVITAMIN W/MINERALS CH
1.0000 | ORAL_TABLET | Freq: Every day | ORAL | Status: DC
  Administered 2017-06-24: 1 via ORAL
  Filled 2017-06-23: qty 1

## 2017-06-23 MED ORDER — FENTANYL CITRATE (PF) 100 MCG/2ML IJ SOLN
50.0000 ug | Freq: Once | INTRAMUSCULAR | Status: AC
  Administered 2017-06-23: 50 ug via INTRAVENOUS

## 2017-06-23 MED ORDER — METHOCARBAMOL 1000 MG/10ML IJ SOLN
500.0000 mg | Freq: Four times a day (QID) | INTRAVENOUS | Status: DC | PRN
  Filled 2017-06-23: qty 5

## 2017-06-23 MED ORDER — LIDOCAINE HCL (CARDIAC) PF 100 MG/5ML IV SOSY
PREFILLED_SYRINGE | INTRAVENOUS | Status: DC | PRN
  Administered 2017-06-23: 20 mg via INTRAVENOUS

## 2017-06-23 MED ORDER — PROPOFOL 10 MG/ML IV BOLUS
INTRAVENOUS | Status: AC
  Filled 2017-06-23: qty 20

## 2017-06-23 MED ORDER — LIDOCAINE 2% (20 MG/ML) 5 ML SYRINGE
INTRAMUSCULAR | Status: AC
  Filled 2017-06-23: qty 5

## 2017-06-23 MED ORDER — METHOCARBAMOL 500 MG PO TABS
500.0000 mg | ORAL_TABLET | Freq: Four times a day (QID) | ORAL | Status: DC | PRN
  Administered 2017-06-23 – 2017-06-24 (×3): 500 mg via ORAL
  Filled 2017-06-23 (×3): qty 1

## 2017-06-23 MED ORDER — MIDAZOLAM HCL 2 MG/2ML IJ SOLN
INTRAMUSCULAR | Status: AC
  Administered 2017-06-23: 2 mg via INTRAVENOUS
  Filled 2017-06-23: qty 2

## 2017-06-23 MED ORDER — PHENYLEPHRINE HCL 10 MG/ML IJ SOLN
INTRAMUSCULAR | Status: DC | PRN
  Administered 2017-06-23: 50 ug/min via INTRAVENOUS

## 2017-06-23 MED ORDER — ACETAMINOPHEN 325 MG PO TABS: 325.0000 mg | ORAL_TABLET | Freq: Four times a day (QID) | ORAL | Status: DC | PRN

## 2017-06-23 MED ORDER — HYDROMORPHONE HCL 1 MG/ML IJ SOLN: 0.5000 mg | INTRAMUSCULAR | Status: DC | PRN

## 2017-06-23 MED ORDER — FLUTICASONE PROPIONATE 50 MCG/ACT NA SUSP
1.0000 | Freq: Every day | NASAL | Status: DC
  Filled 2017-06-23: qty 16

## 2017-06-23 MED ORDER — SUGAMMADEX SODIUM 200 MG/2ML IV SOLN
INTRAVENOUS | Status: AC
  Filled 2017-06-23: qty 2

## 2017-06-23 MED ORDER — VITAMIN D 1000 UNITS PO TABS
2000.0000 [IU] | ORAL_TABLET | Freq: Every day | ORAL | Status: DC
Start: 2017-06-23 — End: 2017-06-24
  Administered 2017-06-24: 2000 [IU] via ORAL
  Filled 2017-06-23: qty 2

## 2017-06-23 MED ORDER — BUPIVACAINE-EPINEPHRINE (PF) 0.5% -1:200000 IJ SOLN
INTRAMUSCULAR | Status: DC | PRN
  Administered 2017-06-23: 25 mL via PERINEURAL

## 2017-06-23 MED ORDER — DEXAMETHASONE SODIUM PHOSPHATE 10 MG/ML IJ SOLN
INTRAMUSCULAR | Status: AC
  Filled 2017-06-23: qty 1

## 2017-06-23 MED ORDER — MELATONIN 3 MG PO TABS
3.0000 mg | ORAL_TABLET | Freq: Every day | ORAL | Status: DC
  Administered 2017-06-23: 3 mg via ORAL
  Filled 2017-06-23: qty 1

## 2017-06-23 MED ORDER — CEFAZOLIN SODIUM-DEXTROSE 2-4 GM/100ML-% IV SOLN
INTRAVENOUS | Status: AC
  Filled 2017-06-23: qty 100

## 2017-06-23 MED ORDER — LORATADINE 10 MG PO TABS: 10.0000 mg | ORAL_TABLET | Freq: Every day | ORAL | Status: DC | PRN

## 2017-06-23 MED ORDER — ONDANSETRON HCL 4 MG/2ML IJ SOLN
INTRAMUSCULAR | Status: AC
  Filled 2017-06-23: qty 2

## 2017-06-23 MED ORDER — METHOCARBAMOL 500 MG PO TABS: 500.0000 mg | ORAL_TABLET | Freq: Three times a day (TID) | ORAL | 1 refills | Status: DC | PRN

## 2017-06-23 MED ORDER — CEFAZOLIN SODIUM-DEXTROSE 2-4 GM/100ML-% IV SOLN
2.0000 g | INTRAVENOUS | Status: AC
  Administered 2017-06-23: 2 g via INTRAVENOUS

## 2017-06-23 MED ORDER — POLYETHYLENE GLYCOL 3350 17 G PO PACK: 17.0000 g | PACK | Freq: Every day | ORAL | Status: DC | PRN

## 2017-06-23 MED ORDER — POLYETHYLENE GLYCOL 3350 17 G PO PACK: 17.0000 g | PACK | Freq: Every day | ORAL | Status: DC

## 2017-06-23 MED ORDER — PHENOL 1.4 % MT LIQD: 1.0000 | OROMUCOSAL | Status: DC | PRN

## 2017-06-23 MED ORDER — BUPIVACAINE-EPINEPHRINE (PF) 0.25% -1:200000 IJ SOLN
INTRAMUSCULAR | Status: AC
  Filled 2017-06-23: qty 30

## 2017-06-23 MED ORDER — DOCUSATE SODIUM 100 MG PO CAPS
100.0000 mg | ORAL_CAPSULE | Freq: Two times a day (BID) | ORAL | Status: DC
  Administered 2017-06-24: 100 mg via ORAL
  Filled 2017-06-23: qty 1

## 2017-06-23 MED ORDER — BISACODYL 10 MG RE SUPP: 10.0000 mg | Freq: Every day | RECTAL | Status: DC | PRN

## 2017-06-23 MED ORDER — CHLORHEXIDINE GLUCONATE 4 % EX LIQD: 60.0000 mL | Freq: Once | CUTANEOUS | Status: DC

## 2017-06-23 MED ORDER — DEXAMETHASONE SODIUM PHOSPHATE 10 MG/ML IJ SOLN
INTRAMUSCULAR | Status: DC | PRN
  Administered 2017-06-23: 10 mg via INTRAVENOUS

## 2017-06-23 MED ORDER — FENTANYL CITRATE (PF) 100 MCG/2ML IJ SOLN
INTRAMUSCULAR | Status: DC | PRN
  Administered 2017-06-23: 50 ug via INTRAVENOUS

## 2017-06-23 MED ORDER — LACTATED RINGERS IV SOLN
INTRAVENOUS | Status: DC
  Administered 2017-06-23 (×2): via INTRAVENOUS

## 2017-06-23 MED ORDER — SCOPOLAMINE 1 MG/3DAYS TD PT72
MEDICATED_PATCH | TRANSDERMAL | Status: AC
  Administered 2017-06-23: 1.5 mg via TRANSDERMAL
  Filled 2017-06-23: qty 1

## 2017-06-23 MED ORDER — OXYCODONE HCL 5 MG PO TABS
5.0000 mg | ORAL_TABLET | ORAL | Status: DC | PRN
  Administered 2017-06-23: 10 mg via ORAL
  Filled 2017-06-23 (×2): qty 2

## 2017-06-23 MED ORDER — CEFAZOLIN SODIUM-DEXTROSE 2-4 GM/100ML-% IV SOLN
2.0000 g | Freq: Four times a day (QID) | INTRAVENOUS | Status: AC
  Administered 2017-06-23 – 2017-06-24 (×3): 2 g via INTRAVENOUS
  Filled 2017-06-23 (×3): qty 100

## 2017-06-23 MED ORDER — SCOPOLAMINE 1 MG/3DAYS TD PT72
1.0000 | MEDICATED_PATCH | Freq: Once | TRANSDERMAL | Status: DC
  Administered 2017-06-23: 1.5 mg via TRANSDERMAL

## 2017-06-23 MED ORDER — METOCLOPRAMIDE HCL 5 MG/ML IJ SOLN: 5.0000 mg | Freq: Three times a day (TID) | INTRAMUSCULAR | Status: DC | PRN

## 2017-06-23 MED ORDER — PHENYLEPHRINE 40 MCG/ML (10ML) SYRINGE FOR IV PUSH (FOR BLOOD PRESSURE SUPPORT)
PREFILLED_SYRINGE | INTRAVENOUS | Status: AC
  Filled 2017-06-23: qty 10

## 2017-06-23 MED ORDER — SUGAMMADEX SODIUM 200 MG/2ML IV SOLN
INTRAVENOUS | Status: DC | PRN
  Administered 2017-06-23: 200 mg via INTRAVENOUS

## 2017-06-23 MED ORDER — PROPOFOL 10 MG/ML IV BOLUS
INTRAVENOUS | Status: DC | PRN
  Administered 2017-06-23: 120 mg via INTRAVENOUS

## 2017-06-23 MED ORDER — MENTHOL 3 MG MT LOZG: 1.0000 | LOZENGE | OROMUCOSAL | Status: DC | PRN

## 2017-06-23 MED ORDER — FENTANYL CITRATE (PF) 250 MCG/5ML IJ SOLN
INTRAMUSCULAR | Status: AC
  Filled 2017-06-23: qty 5

## 2017-06-23 MED ORDER — OXYCODONE-ACETAMINOPHEN 5-325 MG PO TABS: 1.0000 | ORAL_TABLET | ORAL | 0 refills | Status: DC | PRN

## 2017-06-23 MED ORDER — EPHEDRINE SULFATE-NACL 50-0.9 MG/10ML-% IV SOSY
PREFILLED_SYRINGE | INTRAVENOUS | Status: DC | PRN
  Administered 2017-06-23 (×3): 10 mg via INTRAVENOUS
  Administered 2017-06-23: 5 mg via INTRAVENOUS

## 2017-06-23 MED ORDER — BUPIVACAINE-EPINEPHRINE 0.25% -1:200000 IJ SOLN
INTRAMUSCULAR | Status: DC | PRN
  Administered 2017-06-23: 10 mL

## 2017-06-23 MED ORDER — CLONIDINE HCL (ANALGESIA) 100 MCG/ML EP SOLN
EPIDURAL | Status: DC | PRN
  Administered 2017-06-23: 50 ug

## 2017-06-23 MED ORDER — ONDANSETRON HCL 4 MG PO TABS: 4.0000 mg | ORAL_TABLET | Freq: Four times a day (QID) | ORAL | Status: DC | PRN

## 2017-06-23 MED ORDER — SODIUM CHLORIDE 0.9 % IV SOLN: INTRAVENOUS | Status: DC

## 2017-06-23 MED ORDER — PHENYLEPHRINE 40 MCG/ML (10ML) SYRINGE FOR IV PUSH (FOR BLOOD PRESSURE SUPPORT)
PREFILLED_SYRINGE | INTRAVENOUS | Status: DC | PRN
  Administered 2017-06-23: 120 ug via INTRAVENOUS

## 2017-06-23 MED ORDER — OXYCODONE HCL 5 MG PO TABS
10.0000 mg | ORAL_TABLET | ORAL | Status: DC | PRN
  Administered 2017-06-24: 15 mg via ORAL
  Filled 2017-06-23: qty 3

## 2017-06-23 MED ORDER — POLYETHYLENE GLYCOL 400 0.25 % OP SOLN: Freq: Every day | OPHTHALMIC | Status: DC | PRN

## 2017-06-23 MED ORDER — PANTOPRAZOLE SODIUM 40 MG PO TBEC
40.0000 mg | DELAYED_RELEASE_TABLET | Freq: Two times a day (BID) | ORAL | Status: DC
  Administered 2017-06-23 – 2017-06-24 (×2): 40 mg via ORAL
  Filled 2017-06-23 (×2): qty 1

## 2017-06-23 MED ORDER — DOXYCYCLINE HYCLATE 100 MG PO CAPS: 100.0000 mg | ORAL_CAPSULE | Freq: Two times a day (BID) | ORAL | Status: DC | PRN

## 2017-06-23 MED ORDER — ONDANSETRON HCL 4 MG/2ML IJ SOLN
INTRAMUSCULAR | Status: DC | PRN
  Administered 2017-06-23: 4 mg via INTRAVENOUS

## 2017-06-23 MED ORDER — FENTANYL CITRATE (PF) 100 MCG/2ML IJ SOLN
INTRAMUSCULAR | Status: AC
  Administered 2017-06-23: 50 ug via INTRAVENOUS
  Filled 2017-06-23: qty 2

## 2017-06-23 MED ORDER — ROCURONIUM BROMIDE 100 MG/10ML IV SOLN
INTRAVENOUS | Status: DC | PRN
  Administered 2017-06-23: 40 mg via INTRAVENOUS

## 2017-06-23 MED ORDER — EPHEDRINE 5 MG/ML INJ
INTRAVENOUS | Status: AC
  Filled 2017-06-23: qty 10

## 2017-06-23 MED ORDER — 0.9 % SODIUM CHLORIDE (POUR BTL) OPTIME
TOPICAL | Status: DC | PRN
  Administered 2017-06-23 (×2): 1000 mL

## 2017-06-23 MED ORDER — METOCLOPRAMIDE HCL 5 MG PO TABS: 5.0000 mg | ORAL_TABLET | Freq: Three times a day (TID) | ORAL | Status: DC | PRN

## 2017-06-23 SURGICAL SUPPLY — 81 items
AID PSTN UNV HD RSTRNT DISP (MISCELLANEOUS) ×1
BASEPLATE GLENOSPHERE 25 (Plate) ×1 IMPLANT
BASEPLATE GLENOSPHERE 25MM (Plate) ×1 IMPLANT
BIT DRILL 5/64X5 DISP (BIT) ×3 IMPLANT
BIT DRILL F/CENTRAL SCRW 3.2 (BIT) ×1
BIT DRILL F/CENTRAL SCRW 3.2MM (BIT) IMPLANT
BIT DRILL TWIST 2.7 (BIT) ×1 IMPLANT
BIT DRILL TWIST 2.7MM (BIT) ×1
BLADE SAG 18X100X1.27 (BLADE) ×3 IMPLANT
CEMENT HV SMART SET (Cement) ×2 IMPLANT
CLOSURE STERI-STRIP 1/2X4 (GAUZE/BANDAGES/DRESSINGS) ×1
CLOSURE WOUND 1/2 X4 (GAUZE/BANDAGES/DRESSINGS) ×1
CLSR STERI-STRIP ANTIMIC 1/2X4 (GAUZE/BANDAGES/DRESSINGS) ×1 IMPLANT
COVER SURGICAL LIGHT HANDLE (MISCELLANEOUS) ×3 IMPLANT
DRAPE INCISE IOBAN 66X45 STRL (DRAPES) ×3 IMPLANT
DRAPE ORTHO SPLIT 77X108 STRL (DRAPES) ×6
DRAPE SURG ORHT 6 SPLT 77X108 (DRAPES) ×2 IMPLANT
DRAPE U-SHAPE 47X51 STRL (DRAPES) ×3 IMPLANT
DRILL BIT F/CENTRAL SCRW 3.2MM (BIT) ×3
DRSG ADAPTIC 3X8 NADH LF (GAUZE/BANDAGES/DRESSINGS) ×3 IMPLANT
DRSG PAD ABDOMINAL 8X10 ST (GAUZE/BANDAGES/DRESSINGS) ×3 IMPLANT
DURAPREP 26ML APPLICATOR (WOUND CARE) ×3 IMPLANT
ELECT BLADE 4.0 EZ CLEAN MEGAD (MISCELLANEOUS) ×3
ELECT NDL TIP 2.8 STRL (NEEDLE) ×1 IMPLANT
ELECT NEEDLE TIP 2.8 STRL (NEEDLE) ×3 IMPLANT
ELECT REM PT RETURN 9FT ADLT (ELECTROSURGICAL) ×3
ELECTRODE BLDE 4.0 EZ CLN MEGD (MISCELLANEOUS) ×1 IMPLANT
ELECTRODE REM PT RTRN 9FT ADLT (ELECTROSURGICAL) ×1 IMPLANT
GAUZE SPONGE 4X4 12PLY STRL (GAUZE/BANDAGES/DRESSINGS) ×3 IMPLANT
GAUZE SPONGE 4X4 12PLY STRL LF (GAUZE/BANDAGES/DRESSINGS) ×2 IMPLANT
GLENOID SPHERE 36MM CVD +3 (Orthopedic Implant) ×2 IMPLANT
GLOVE BIOGEL PI ORTHO PRO 7.5 (GLOVE) ×2
GLOVE BIOGEL PI ORTHO PRO SZ8 (GLOVE) ×2
GLOVE ORTHO TXT STRL SZ7.5 (GLOVE) ×3 IMPLANT
GLOVE PI ORTHO PRO STRL 7.5 (GLOVE) ×1 IMPLANT
GLOVE PI ORTHO PRO STRL SZ8 (GLOVE) ×1 IMPLANT
GLOVE SURG ORTHO 8.5 STRL (GLOVE) ×3 IMPLANT
GOWN STRL REUS W/ TWL LRG LVL3 (GOWN DISPOSABLE) ×1 IMPLANT
GOWN STRL REUS W/ TWL XL LVL3 (GOWN DISPOSABLE) ×2 IMPLANT
GOWN STRL REUS W/TWL LRG LVL3 (GOWN DISPOSABLE) ×3
GOWN STRL REUS W/TWL XL LVL3 (GOWN DISPOSABLE) ×6
KIT BASIN OR (CUSTOM PROCEDURE TRAY) ×3 IMPLANT
KIT TURNOVER KIT B (KITS) ×3 IMPLANT
LINER HUMERAL 36 +0 60D (Liner) ×2 IMPLANT
MANIFOLD NEPTUNE II (INSTRUMENTS) ×3 IMPLANT
NDL 1/2 CIR MAYO (NEEDLE) ×1 IMPLANT
NDL HYPO 25GX1X1/2 BEV (NEEDLE) ×1 IMPLANT
NEEDLE 1/2 CIR MAYO (NEEDLE) ×3 IMPLANT
NEEDLE HYPO 25GX1X1/2 BEV (NEEDLE) ×3 IMPLANT
NS IRRIG 1000ML POUR BTL (IV SOLUTION) ×5 IMPLANT
PACK SHOULDER (CUSTOM PROCEDURE TRAY) ×3 IMPLANT
PAD ABD 8X10 STRL (GAUZE/BANDAGES/DRESSINGS) ×2 IMPLANT
PAD ARMBOARD 7.5X6 YLW CONV (MISCELLANEOUS) ×6 IMPLANT
PIN THREADED REVERSE (PIN) ×2 IMPLANT
RESTRAINT HEAD UNIVERSAL NS (MISCELLANEOUS) ×3 IMPLANT
SCREW BONE LOCKING 4.75X30X3.5 (Screw) ×2 IMPLANT
SCREW BONE STRL 6.5MMX25MM (Screw) ×2 IMPLANT
SCREW LOCKING 4.75MMX15MM (Screw) ×2 IMPLANT
SCREW LOCKING STRL 4.75X25X3.5 (Screw) ×2 IMPLANT
SLING ARM IMMOBILIZER LRG (SOFTGOODS) ×2 IMPLANT
SPONGE LAP 18X18 X RAY DECT (DISPOSABLE) IMPLANT
SPONGE LAP 4X18 X RAY DECT (DISPOSABLE) ×3 IMPLANT
STEM TRABECULAR LP 12X130 (Shoulder) ×2 IMPLANT
STRIP CLOSURE SKIN 1/2X4 (GAUZE/BANDAGES/DRESSINGS) ×2 IMPLANT
SUCTION FRAZIER HANDLE 10FR (MISCELLANEOUS) ×2
SUCTION TUBE FRAZIER 10FR DISP (MISCELLANEOUS) ×1 IMPLANT
SUT FIBERWIRE #2 38 T-5 BLUE (SUTURE)
SUT MNCRL AB 4-0 PS2 18 (SUTURE) ×3 IMPLANT
SUT VIC AB 0 CT1 27 (SUTURE) ×3
SUT VIC AB 0 CT1 27XBRD ANBCTR (SUTURE) IMPLANT
SUT VIC AB 0 CT2 27 (SUTURE) ×3 IMPLANT
SUT VIC AB 2-0 CT1 27 (SUTURE) ×3
SUT VIC AB 2-0 CT1 TAPERPNT 27 (SUTURE) ×1 IMPLANT
SUT VICRYL 0 CT 1 36IN (SUTURE) ×3 IMPLANT
SUTURE FIBERWR #2 38 T-5 BLUE (SUTURE) IMPLANT
SYR CONTROL 10ML LL (SYRINGE) ×3 IMPLANT
TAPE CLOTH SURG 6X10 WHT LF (GAUZE/BANDAGES/DRESSINGS) ×2 IMPLANT
TOWEL OR 17X24 6PK STRL BLUE (TOWEL DISPOSABLE) ×3 IMPLANT
TOWEL OR 17X26 10 PK STRL BLUE (TOWEL DISPOSABLE) ×3 IMPLANT
TOWER CARTRIDGE SMART MIX (DISPOSABLE) ×2 IMPLANT
YANKAUER SUCT BULB TIP NO VENT (SUCTIONS) ×3 IMPLANT

## 2017-06-23 NOTE — Interval H&P Note (Signed)
History and Physical Interval Note:  06/23/2017 12:19 PM  Tracy [name]  has presented today for surgery, with the diagnosis of Right shoulder osteoarthritis/ rotator cuff tear  The various methods of treatment have been discussed with the patient and family. After consideration of risks, benefits and other options for treatment, the patient has consented to  Procedure(s): RIGHT REVERSE SHOULDER ARTHROPLASTY (Right) as a surgical intervention .  The patient's history has been reviewed, patient examined, no change in status, stable for surgery.  I have reviewed the patient's chart and labs.  Questions were answered to the patient's satisfaction.     Marwin Primmer,STEVEN R

## 2017-06-23 NOTE — Brief Op Note (Signed)
06/23/2017  3:32 PM  PATIENT:  [name]  67 y.o. female  PRE-OPERATIVE DIAGNOSIS:  Right shoulder osteoarthritis/ rotator cuff tear  POST-OPERATIVE DIAGNOSIS:  Right shoulder osteoarthritis/ rotator cuff tear  PROCEDURE:  Procedure(s): RIGHT REVERSE SHOULDER ARTHROPLASTY (Right) Biomet Comprehensive with Zimmer TM stem  SURGEON:  Surgeon(s) and Role:    Netta Cedars, MD - Primary  PHYSICIAN ASSISTANT:   ASSISTANTS: Ventura Bruns, PA-C   ANESTHESIA:   regional and general  EBL:  200 mL   BLOOD ADMINISTERED:none  DRAINS: none   LOCAL MEDICATIONS USED:  MARCAINE     SPECIMEN:  No Specimen  DISPOSITION OF SPECIMEN:  N/A  COUNTS:  YES  TOURNIQUET:  * No tourniquets in log *  DICTATION: .Other Dictation: Dictation Number [id number]  PLAN OF CARE: Admit to inpatient   PATIENT DISPOSITION:  PACU - hemodynamically stable.   Delay start of Pharmacological VTE agent (>24hrs) due to surgical blood loss or risk of bleeding: not applicable

## 2017-06-23 NOTE — Anesthesia Preprocedure Evaluation (Addendum)
Anesthesia Evaluation  Patient identified by MRN, date of birth, ID band Patient awake    Reviewed: Allergy & Precautions, NPO status , Patient's Chart, lab work & pertinent test results  History of Anesthesia Complications (+) PONV  Airway Mallampati: II  TM Distance: >3 FB Neck ROM: Full    Dental  (+) Dental Advisory Given, Teeth Intact   Pulmonary neg pulmonary ROS,    breath sounds clear to auscultation       Cardiovascular negative cardio ROS   Rhythm:Regular Rate:Normal     Neuro/Psych Anxiety negative neurological ROS     GI/Hepatic Neg liver ROS, GERD  ,  Endo/Other  Hyperthyroidism   Renal/GU negative Renal ROS     Musculoskeletal  (+) Arthritis ,   Abdominal   Peds  Hematology negative hematology ROS (+)   Anesthesia Other Findings   Reproductive/Obstetrics                            Lab Results  Component Value Date   WBC 4.9 06/19/2017   HGB 14.2 06/19/2017   HCT 42.5 06/19/2017   MCV 91.6 06/19/2017   PLT 243 06/19/2017   Lab Results  Component Value Date   CREATININE 0.87 06/19/2017   BUN 17 06/19/2017   NA 137 06/19/2017   K 4.1 06/19/2017   CL 104 06/19/2017   CO2 25 06/19/2017    Anesthesia Physical Anesthesia Plan  ASA: II  Anesthesia Plan: General   Post-op Pain Management:  Regional for Post-op pain   Induction: Intravenous  PONV Risk Score and Plan: 4 or greater and Dexamethasone, Ondansetron and Treatment may vary due to age or medical condition  Airway Management Planned: Oral ETT  Additional Equipment:   Intra-op Plan:   Post-operative Plan: Extubation in OR  Informed Consent: I have reviewed the patients History and Physical, chart, labs and discussed the procedure including the risks, benefits and alternatives for the proposed anesthesia with the patient or authorized representative who has indicated his/her understanding and  acceptance.   Dental advisory given  Plan Discussed with: CRNA  Anesthesia Plan Comments:         Anesthesia Quick Evaluation

## 2017-06-23 NOTE — Discharge Instructions (Signed)
Ice to the shoulder as much as you can.  Keep the incision clean and dry and covered for one week, then ok to get it wet in the shower.   Do not reach behind you back or push up out of the chair.  Ok to remove the sling while in the home and seated and can do gentle self care and activities of daily living.  Keep pillows or a blanket propped behind the right elbow to keep your arm across your waist.  Follow up with Dr. Veverly Fells in two weeks in the office, call [phone]

## 2017-06-23 NOTE — Transfer of Care (Signed)
Immediate Anesthesia Transfer of Care Note  Patient: Tracy [name]  Procedure(s) Performed: RIGHT REVERSE SHOULDER ARTHROPLASTY (Right Shoulder)  Patient Location: PACU  Anesthesia Type:GA combined with regional for post-op pain  Level of Consciousness: awake, alert  and oriented  Airway & Oxygen Therapy: Patient Spontanous Breathing and Patient connected to nasal cannula oxygen  Post-op Assessment: Report given to RN, Post -op Vital signs reviewed and stable and Patient moving all extremities  Post vital signs: Reviewed and stable  Last Vitals:  Vitals Value Taken Time  BP    Temp    Pulse 88 06/23/2017  4:58 PM  Resp 22 06/23/2017  4:58 PM  SpO2 97 % 06/23/2017  4:58 PM  Vitals shown include unvalidated device data.  Last Pain:  Vitals:   06/23/17 1207  TempSrc:   PainSc: 6       Patients Stated Pain Goal: 3 (86/57/84 6962)  Complications: No apparent anesthesia complications

## 2017-06-23 NOTE — Anesthesia Procedure Notes (Addendum)
Anesthesia Regional Block: Interscalene brachial plexus block   Pre-Anesthetic Checklist: ,, timeout performed, Correct Patient, Correct Site, Correct Laterality, Correct Procedure, Correct Position, site marked, Risks and benefits discussed,  Surgical consent,  Pre-op evaluation,  At surgeon's request and post-op pain management  Laterality: Right  Prep: chloraprep       Needles:  Injection technique: Single-shot  Needle Type: Echogenic Stimulator Needle     Needle Length: 9cm  Needle Gauge: 21     Additional Needles:   Procedures:, nerve stimulator,,, ultrasound used (permanent image in chart),,,,   Nerve Stimulator or Paresthesia:  Response: deltoid, 0.5 mA,   Additional Responses:   Narrative:  Start time: 06/23/2017 12:20 PM End time: 06/23/2017 12:27 PM Injection made incrementally with aspirations every 5 mL.  Performed by: Personally  Anesthesiologist: Suzette Battiest, MD

## 2017-06-23 NOTE — Anesthesia Procedure Notes (Signed)
Procedure Name: Intubation Date/Time: 06/23/2017 12:50 PM Performed by: Kyung Rudd, CRNA Pre-anesthesia Checklist: Patient identified, Emergency Drugs available, Suction available and Patient being monitored Patient Re-evaluated:Patient Re-evaluated prior to induction Oxygen Delivery Method: Circle system utilized Preoxygenation: Pre-oxygenation with 100% oxygen Induction Type: IV induction Ventilation: Mask ventilation without difficulty Laryngoscope Size: Mac and 3 Grade View: Grade II Tube type: Oral Tube size: 7.0 mm Number of attempts: 1 Airway Equipment and Method: Stylet Placement Confirmation: ETT inserted through vocal cords under direct vision,  positive ETCO2 and breath sounds checked- equal and bilateral

## 2017-06-24 LAB — HEMOGLOBIN AND HEMATOCRIT, BLOOD
HEMATOCRIT: 33.3 % — AB (ref 36.0–46.0)
Hemoglobin: 10.8 g/dL — ABNORMAL LOW (ref 12.0–15.0)

## 2017-06-24 LAB — BASIC METABOLIC PANEL
Anion gap: 8 (ref 5–15)
BUN: 20 mg/dL (ref 6–20)
CHLORIDE: 102 mmol/L (ref 101–111)
CO2: 26 mmol/L (ref 22–32)
CREATININE: 1.01 mg/dL — AB (ref 0.44–1.00)
Calcium: 8.5 mg/dL — ABNORMAL LOW (ref 8.9–10.3)
GFR calc Af Amer: >60 mL/min (ref >60)
GFR, EST NON AFRICAN AMERICAN: 55 mL/min — AB (ref >60)
GLUCOSE: 103 mg/dL — AB (ref 65–99)
POTASSIUM: 3.7 mmol/L (ref 3.5–5.1)
Sodium: 136 mmol/L (ref 135–145)

## 2017-06-24 NOTE — Progress Notes (Signed)
Patient is discharged from room 3C04 at this time. Alert and in stable condition. IV site d/c'd and instructions read to patient and spouse with understanding verbalized. Left unit with all belongings at side.

## 2017-06-24 NOTE — Anesthesia Postprocedure Evaluation (Signed)
Anesthesia Post Note  Patient: Tracy [name]  Procedure(s) Performed: RIGHT REVERSE SHOULDER ARTHROPLASTY (Right Shoulder)     Patient location during evaluation: PACU Anesthesia Type: General Level of consciousness: awake and alert Pain management: pain level controlled Vital Signs Assessment: post-procedure vital signs reviewed and stable Respiratory status: spontaneous breathing, nonlabored ventilation and respiratory function stable Cardiovascular status: blood pressure returned to baseline and stable Postop Assessment: no apparent nausea or vomiting Anesthetic complications: no    Last Vitals:  Vitals:   06/24/17 0345 06/24/17 0635  BP: (!) 92/53 (!) 92/56  Pulse: 78 81  Resp: 16   Temp: 36.5 C   SpO2: 97%     Last Pain:  Vitals:   06/24/17 0634  TempSrc:   PainSc: 0-No pain   Pain Goal: Patients Stated Pain Goal: 3 (06/23/17 1207)               Lynda Rainwater

## 2017-06-24 NOTE — Evaluation (Addendum)
Occupational Therapy Evaluation and Discharge Patient Details Name: Tracy [name] MRN: [id number] DOB: April 12, 1950 Today's Date: 06/24/2017    History of Present Illness Pt is a 67 y.o. female s/p R reverse total shoulder arthroplasty. PMH significant for but not limited to: anxiety, arthritis, bursitis of L hip, C. difficile diarrhea, diverticulitis, GERD, hyperlipidemia, hyperthyroidism, osteopenia, osteoporosis.   Clinical Impression   PTA, pt was independent with ADL and functional mobility. She currently is limited by slight dizziness during transitional movements and R shoulder pain. Pt with continued dull sensation to RUE with decreased motor control of distal joints as nerve block is wearing off gradually. Pt educated concerning R shoulder active protocol including compensatory strategies for ADL as well as sling wear and HEP with handouts provided. Pt verbalized and demonstrated understanding of all topics. She does require mod assist for UB dressing and min assist for LB dressing and bathing at this time and her husband will be able to provide 24 hour assistance initially post-acute D/C. Educated pt concerning safe tub/shower transfers and she is able to complete with min assist. All OT education complete and pt demonstrates understanding. No further acute OT needs identified. Will sign off.     Follow Up Recommendations  Follow surgeon's recommendation for DC plan and follow-up therapies    Equipment Recommendations  None recommended by OT    Recommendations for Other Services       Precautions / Restrictions Precautions Precautions: Shoulder Type of Shoulder Precautions: Active protocol: Sling for comfort and sleep, NWB R UE, AROM elbow/wrist/hand to tolerance, AROM/PROM R shoulder (forward flexion: 0-90, abduction 0-60, external rotation 0-30, "gentle ADLs ok" Shoulder Interventions: Shoulder sling/immobilizer;For comfort;Off for dressing/bathing/exercises(for  sleep) Precaution Booklet Issued: Yes (comment) Precaution Comments: Shoulder precautions reviewed in detail.  Required Braces or Orthoses: Sling(R shoulder) Restrictions Weight Bearing Restrictions: No      Mobility Bed Mobility Overal bed mobility: Modified Independent             General bed mobility comments: Increased time but no physical assistance required  Transfers Overall transfer level: Needs assistance Equipment used: None Transfers: Sit to/from Stand Sit to Stand: Min guard         General transfer comment: Multiple sit<>stands completed during ADL with min guard assist for stability.     Balance Overall balance assessment: Needs assistance Sitting-balance support: No upper extremity supported;Feet supported Sitting balance-Leahy Scale: Good     Standing balance support: No upper extremity supported;During functional activity;Single extremity supported Standing balance-Leahy Scale: Fair Standing balance comment: Min guard assist during ambulation                           ADL either performed or assessed with clinical judgement   ADL Overall ADL's : Needs assistance/impaired Eating/Feeding: Set up;Sitting   Grooming: Standing;Min guard   Upper Body Bathing: Sitting;Minimal assistance   Lower Body Bathing: Minimal assistance;Sit to/from stand   Upper Body Dressing : Moderate assistance;Sitting Upper Body Dressing Details (indicate cue type and reason): including sling Lower Body Dressing: Minimal assistance;Sit to/from stand Lower Body Dressing Details (indicate cue type and reason): Assist to achieve clothing over hips Toilet Transfer: Min guard;Ambulation   Toileting- Clothing Manipulation and Hygiene: Sit to/from stand;Minimal assistance Toileting - Clothing Manipulation Details (indicate cue type and reason): Assist for clothing over hips. Tub/ Shower Transfer: Minimal assistance;Ambulation   Functional mobility during ADLs: Min  guard General ADL Comments: Pt educated concerning compensatory dressing, bathing,  grooming compensatory strategies as well as methods to don/doff sling.      Vision Baseline Vision/History: Wears glasses Patient Visual Report: No change from baseline Vision Assessment?: No apparent visual deficits     Perception     Praxis      Pertinent Vitals/Pain Pain Assessment: 0-10 Pain Score: 7  Pain Location: R shoulder Pain Descriptors / Indicators: Aching;Operative site guarding;Sore Pain Intervention(s): Limited activity within patient's tolerance;Monitored during session;Repositioned     Hand Dominance     Extremity/Trunk Assessment Upper Extremity Assessment Upper Extremity Assessment: RUE deficits/detail RUE Deficits / Details: Diminished sensation for light touch throughout R UE and decreased motor control secondary to block not completely worn off. Decreased strength and ROM at shoulder within parameters as expected post-operatively.    Lower Extremity Assessment Lower Extremity Assessment: Overall WFL for tasks assessed       Communication Communication Communication: No difficulties   Cognition Arousal/Alertness: Awake/alert Behavior During Therapy: WFL for tasks assessed/performed Overall Cognitive Status: Within Functional Limits for tasks assessed                                     General Comments       Exercises Exercises: Shoulder Shoulder Exercises Shoulder Flexion: AAROM;Right;10 reps;Supine Shoulder ABduction: AAROM;Right;10 reps;Seated Shoulder External Rotation: AAROM;Right;10 reps;Seated Elbow Flexion: AAROM;Right;10 reps;Seated Elbow Extension: AAROM;Right;10 reps;Seated Wrist Flexion: AAROM;Right;10 reps;Seated Wrist Extension: AAROM;Right;10 reps;Seated Digit Composite Flexion: AROM;Right;10 reps;Seated   Shoulder Instructions Shoulder Instructions Donning/doffing shirt without moving shoulder: Moderate assistance Method for  sponge bathing under operated UE: Minimal assistance Donning/doffing sling/immobilizer: Moderate assistance Correct positioning of sling/immobilizer: Supervision/safety ROM for elbow, wrist and digits of operated UE: Supervision/safety Sling wearing schedule (on at all times/off for ADL's): Supervision/safety Proper positioning of operated UE when showering: Supervision/safety Positioning of UE while sleeping: [place] expects to be discharged to:: Private residence Living Arrangements: Spouse/significant other Available Help at Discharge: Family Type of Home: House Home Access: Stairs to enter Technical brewer of Steps: 1   Home Layout: Two level Alternate Level Stairs-Number of Steps: 12   Bathroom Shower/Tub: Teacher, early years/pre: Standard     Home Equipment: Bedside commode          Prior Functioning/Environment Level of Independence: Independent                 OT Problem List: Decreased strength;Decreased activity tolerance;Impaired balance (sitting and/or standing);Decreased safety awareness;Decreased knowledge of use of DME or AE;Decreased knowledge of precautions;Pain;Impaired UE functional use      OT Treatment/Interventions:      OT Goals(Current goals can be found in the care plan section) Acute Rehab OT Goals Patient Stated Goal: go home OT Goal Formulation: With patient  OT Frequency:     Barriers to D/C:            Co-evaluation              AM-PAC PT "6 Clicks" Daily Activity     Outcome Measure Help from another person eating meals?: A Little Help from another person taking care of personal grooming?: A Little Help from another person toileting, which includes using toliet, bedpan, or urinal?: A Little Help from another person bathing (including washing, rinsing, drying)?: A Little Help from another person to put on and taking off regular upper body clothing?: A Lot Help  from another person to  put on and taking off regular lower body clothing?: A Little 6 Click Score: 17   End of Session Equipment Utilized During Treatment: Other (comment)(R shoulder sling) Nurse Communication: Mobility status  Activity Tolerance: Patient tolerated treatment well Patient left: in bed;with call bell/phone within reach  OT Visit Diagnosis: Other abnormalities of gait and mobility (R26.89);Pain Pain - Right/Left: Right Pain - part of body: Shoulder                Time: 9747-1855 OT Time Calculation (min): 48 min Charges:  OT General Charges $OT Visit: 1 Visit OT Evaluation $OT Eval Moderate Complexity: 1 Mod OT Treatments $Self Care/Home Management : 23-37 mins G-Codes:     Norman Herrlich, MS OTR/L  Pager: [place] A [place] 06/24/2017, 11:17 AM

## 2017-06-24 NOTE — Progress Notes (Signed)
   Subjective: 1 Day Post-Op Procedure(s) (LRB): RIGHT REVERSE SHOULDER ARTHROPLASTY (Right)  Pt with minimal to no pain this morning Working with therapy currently Denies any new symptoms or issues Patient reports pain as mild.  Objective:   VITALS:   Vitals:   06/24/17 0635 06/24/17 0737  BP: (!) 92/56 (!) 94/42  Pulse: 81 76  Resp:  17  Temp:  98.8 F (37.1 C)  SpO2:  90%    Right shoulder incision healing well nv intact distally No rashes or edema  LABS Recent Labs    06/24/17 0801  HGB 10.8*  HCT 33.3*    No results for input(s): NA, K, BUN, CREATININE, GLUCOSE in the last 72 hours.   Assessment/Plan: 1 Day Post-Op Procedure(s) (LRB): RIGHT REVERSE SHOULDER ARTHROPLASTY (Right) D/c home today F/u in 2 weeks in the office Gentle activity as tolerated Pain management as needed    Kathrynn Speed, [place] is now [employer] [facility] [address], [place], [place], [place] [address] Phone: [phone] [email or web address] Facebook  [employer]

## 2017-06-24 NOTE — Op Note (Signed)
NAMEQUANIKA, SOLEM            ACCOUNT NO.:  [id number]  MEDICAL RECORD NO.:  [id number]  LOCATION:  [id number]                        FACILITY:  [facility]  PHYSICIAN:  Doran Heater. Veverly Fells, M.D. DATE OF BIRTH:  1950/10/12  DATE OF PROCEDURE:  06/23/2017 DATE OF DISCHARGE:                              OPERATIVE REPORT   PREOPERATIVE DIAGNOSIS:  Right shoulder osteoarthritis, end staged with rotator cuff insufficiency.  POSTOPERATIVE DIAGNOSIS:  Right shoulder osteoarthritis, end staged with rotator cuff insufficiency.  PROCEDURE PERFORMED:  Right reverse shoulder replacement using Biomet Comprehensive System with Zimmer Trabecular Metal Humerus.  ATTENDING SURGEON:  Doran Heater. Veverly Fells, M.D.  ASSISTANT:  Abbott Pao. Dixon, PA-C, who was scrubbed during the entire procedure and necessary for satisfactory completion of surgery.  General anesthesia was used plus interscalene block.  Estimated blood loss was 150 mL.  FLUID REPLACEMENT:  1500 mL crystalloid.  Instrument count was correct.  There were no complications.  Perioperative antibiotics were given.  INDICATIONS:  The patient is a 67 year old female with worsening right shoulder pain secondary to rotator cuff tear arthropathy.  The patient has had progressive cartilage loss and superior head migration consistent with rotator cuff tear arthropathy.  The patient has disabling pain and poor function and desires reverse shoulder placement to restore function and eliminate pain to the shoulder.  Informed consent obtained.  DESCRIPTION OF THE PROCEDURE:  After an adequate level of anesthesia was achieved, the patient was positioned in the modified beach-chair position.  Right shoulder correctly identified and sterilely prepped and draped in the usual manner.  Time-out called.  We entered the shoulder using standard deltopectoral incision starting at the coracoid process extending down to the anterior humerus.  Dissection taken down  through the subcutaneous tissues.  Cephalic vein was identified and taken laterally with the deltoid, pectoralis was taken medially.  Conjoined tendon identified, retracted medially.  We tenodesed the biceps in situ with 0 Vicryl figure-of-eight suture x2.  We then released the subscap remnant off the lesser tuberosity and tagged for protection of the axillary nerve with #2 FiberWire suture.  Progressive external rotation of the shoulder and released the inferior capsular attachment on the humerus.  We then identified completely worn out cartilage on the shoulder with bone-on-bone and large osteophytes.  We extended the shoulder.  There was no supraspinatus, infraspinatus, and very little teres minor on the back left.  So, we instrumented the proximal humerus with a reamer and reamed up to a size 12 mm diameter, and we then went ahead and made our cut in 20 degrees of retroversion for the Trabecular Metal stem on the humeral side, and this is a Biomet/Zimmer Reverse System.  So, at this point with our head cut done, we did remove excess osteophytes with a rongeur.  We then subluxed the humerus posteriorly and did a 360-degree capsule labral removal.  There was quite a bit of synovitis that we removed.  We got back to where we had good exposure 360 exposure of the glenoid face.  We placed our retractors.  We identified our axillary nerve and protected that.  We then placed our central guide pin for the Biomet comprehensive baseplate.  We used the mini  baseplate.  Once we had placed our central guide pin, we went ahead and reamed for the baseplate.  We checked our depth to make sure we had the appropriate depth.  Next, we went ahead and removed our guide pin, and then, we impacted the baseplate into position.  We then measured our depth for a 6.5 screw and then went ahead and placed a 25 mm x 6.5 compression screw.  We had good compression, excellent baseplate security.  We placed then a 35  screw inferiorly, a 25 screw superiorly, and then, we did an additional screw which was a 15 mm lock screw anteriorly.  We had outstanding baseplate security.  We then selected a 36 +3 head and set it on the A setting and impacted that in position. We next directed our attention towards the humeral side.  We went ahead and did our proximal reaming and then our metaphyseal reaming with the appropriate reamers, and we placed the size 12 TM Zimmer stem and placed on the humerus.  We then reduced with a +0 poly, 36 +0 poly and had a nice little snap.  Everything was appropriately tensioned.  The conjoined was appropriately tensioned.  We irrigated thoroughly, then removed the trial components from the humeral side, and then selected the real Trabecular Metal stem.  I did do a little bit of cement proximally just to give [name] additional security with initial fixation as the patient had some osteopenia and osteoporosis it felt in that proximal bones.  We just wanted to make sure that TM stem had some additional stability.  So, we used a very small amount of HV cement proximally just around the proximal portion of the neck and then impacted that in position, held until the cement hardened, but once that was in place, we trialed again with the +0 and we were happy with that and we selected the real +0 poly and impacted that in position, so a 36 +0.  We then reduced the shoulder, quite stable throughout a full arc of motion.  Conjoined appropriately tension.  No gapping with inferior pole on the humerus or with progressive external rotation.  We irrigated thoroughly and then closed.  After resection of the remnant of the subscapularis, we closed the deltopectoral interval with 0 Vicryl suture followed by 2-0 Vicryl for a subcutaneous closure and 4-0 Monocryl for skin.  Steri-Strips applied followed by a sterile dressing.  The patient tolerated the surgery well.     Doran Heater. Veverly Fells,  M.D.     SRN/MEDQ  D:  06/23/2017  T:  06/24/2017  Job:  [id number]

## 2017-06-24 NOTE — Discharge Summary (Signed)
Orthopedic Discharge Summary        Physician Discharge Summary  Patient ID: Tracy [name] MRN: [id number] DOB/AGE: [birth year] 67 y.o.  Admit date: 06/23/2017 Discharge date: 06/24/2017   Procedures:  Procedure(s) (LRB): RIGHT REVERSE SHOULDER ARTHROPLASTY (Right)  Attending Physician:  Dr. Esmond Plants  Admission Diagnoses:   Right shoulder cuff arthropathy  Discharge Diagnoses:  Right shoulder cuff arthropathy   Past Medical History:  Diagnosis Date  . Allergy   . Anxiety   . Arthritis   . Bursitis of left hip   . C. difficile diarrhea   . Cancer (HCC)    basil cell  . Cataract    bilateral  . Complication of anesthesia   . Diverticulitis   . GERD (gastroesophageal reflux disease)   . Hx: UTI (urinary tract infection)   . Hyperlipidemia   . Hyperthyroidism   . Osteopenia   . Osteoporosis   . PONV (postoperative nausea and vomiting)   . Rosacea   . Rotator cuff arthropathy, left     PCP: [name], MD   Discharged Condition: good  Hospital Course:  Patient underwent the above stated procedure on 06/23/2017. Patient tolerated the procedure well and brought to the recovery room in good condition and subsequently to the floor. Patient had an uncomplicated hospital course and was stable for discharge.   Disposition: Discharge disposition: 01-Home or Self Care      with follow up in 2 weeks   Follow-up Information    Netta Cedars, MD. Call in 2 weeks.   Specialty:  Orthopedic Surgery Why:  [phone] Contact information: [address] [place] [address] [phone]           Discharge Instructions    Call MD / Call 911   Complete by:  As directed    If you experience chest pain or shortness of breath, CALL 911 and be transported to the hospital emergency room.  If you develope a fever above 101 F, pus (white drainage) or increased drainage or redness at the wound, or calf pain, call your surgeon's office.     Constipation Prevention   Complete by:  As directed    Drink plenty of fluids.  Prune juice may be helpful.  You may use a stool softener, such as Colace (over the counter) 100 mg twice a day.  Use MiraLax (over the counter) for constipation as needed.   Diet - low sodium heart healthy   Complete by:  As directed    Increase activity slowly as tolerated   Complete by:  As directed       Allergies as of 06/24/2017      Reactions   Demerol [meperidine] Nausea And Vomiting   Latex Rash, Other (See Comments)   Reaction:  Blisters    Other Itching   Nylon sheets, pt prefers cotton sheets    Tramadol Nausea And Vomiting   Vicodin [hydrocodone-acetaminophen] Other (See Comments)   hallucinations      Medication List    STOP taking these medications   DULoxetine 20 MG capsule Commonly known as:  CYMBALTA     TAKE these medications   BLINK TEARS OP Place 1 drop into both eyes daily as needed (dry eyes).   CALCIUM 600+D PO Take 1 tablet by mouth 2 (two) times daily.   doxycycline 100 MG capsule Commonly known as:  VIBRAMYCIN Take 100 mg by mouth 2 (two) times daily as needed (rosacea).   fenofibrate 160  MG tablet TAKE 1 TABLET BY MOUTH  DAILY   fluticasone 50 MCG/ACT nasal spray Commonly known as:  FLONASE USE 1 SPRAY INTO BOTH  NOSTRILS DAILY. What changed:  See the new instructions.   loratadine 10 MG tablet Commonly known as:  CLARITIN Take 10 mg by mouth daily as needed for allergies.   Melatonin 5 MG Tabs Take 5 mg by mouth at bedtime.   methocarbamol 500 MG tablet Commonly known as:  ROBAXIN Take 1 tablet (500 mg total) by mouth 3 (three) times daily as needed.   multivitamin with minerals Tabs tablet Take 1 tablet by mouth daily.   oxyCODONE-acetaminophen 5-325 MG tablet Commonly known as:  PERCOCET Take 1-2 tablets by mouth every 4 (four) hours as needed for severe pain.   pantoprazole 40 MG tablet Commonly known as:  PROTONIX TAKE 1 TABLET BY MOUTH  TWO  TIMES DAILY   polyethylene glycol packet Commonly known as:  MIRALAX / GLYCOLAX Take 17 g by mouth daily.   Vitamin D 2000 units tablet Take 2,000 Units by mouth daily.         Signed: Ventura Bruns 06/24/2017, 8:29 AM  [facility] is now [employer] [address], [place], [place], [place] [address] Phone: [place]

## 2017-06-28 ENCOUNTER — Encounter (HOSPITAL_COMMUNITY): Payer: Self-pay | Admitting: Orthopedic Surgery

## 2017-07-04 ENCOUNTER — Other Ambulatory Visit: Payer: Self-pay | Admitting: Family Medicine

## 2017-07-04 DIAGNOSIS — Z4789 Encounter for other orthopedic aftercare: Secondary | ICD-10-CM | POA: Insufficient documentation

## 2017-07-31 ENCOUNTER — Other Ambulatory Visit (HOSPITAL_COMMUNITY)
Admission: RE | Admit: 2017-07-31 | Discharge: 2017-07-31 | Disposition: A | Discharge status: Home / Self Care | Payer: Medicare Other | Source: Ambulatory Visit | Attending: Family Medicine | Admitting: Family Medicine

## 2017-07-31 ENCOUNTER — Encounter: Payer: Self-pay | Admitting: Family Medicine

## 2017-07-31 ENCOUNTER — Other Ambulatory Visit: Payer: Self-pay

## 2017-07-31 ENCOUNTER — Institutional Professional Consult (permissible substitution): Payer: Medicare Other | Admitting: Pulmonary Disease

## 2017-07-31 ENCOUNTER — Ambulatory Visit: Payer: Medicare Other | Admitting: Family Medicine

## 2017-07-31 VITALS — BP 120/84 | HR 54 | Temp 98.0°F | Resp 15 | Ht 64.0 in | Wt 169.4 lb

## 2017-07-31 DIAGNOSIS — M810 Age-related osteoporosis without current pathological fracture: Secondary | ICD-10-CM

## 2017-07-31 DIAGNOSIS — N898 Other specified noninflammatory disorders of vagina: Secondary | ICD-10-CM | POA: Insufficient documentation

## 2017-07-31 DIAGNOSIS — L299 Pruritus, unspecified: Secondary | ICD-10-CM

## 2017-07-31 LAB — POCT URINALYSIS DIPSTICK
Bilirubin, UA: NEGATIVE
Blood, UA: NEGATIVE
Glucose, UA: NEGATIVE
KETONES UA: NEGATIVE
Leukocytes, UA: NEGATIVE
NITRITE UA: NEGATIVE
PROTEIN UA: NEGATIVE
SPEC GRAV UA: 1.015 (ref 1.010–1.025)
UROBILINOGEN UA: 0.2 U/dL
pH, UA: 6 (ref 5.0–8.0)

## 2017-07-31 MED ORDER — FLUCONAZOLE 150 MG PO TABS: 150.0000 mg | ORAL_TABLET | Freq: Once | ORAL | 0 refills | Status: AC

## 2017-07-31 NOTE — Assessment & Plan Note (Signed)
Pt is now interested in starting Prolia injxns q6 months.  Will start approval process

## 2017-07-31 NOTE — Patient Instructions (Signed)
Follow up as needed or as scheduled Take the Diflucan x1 dose today We'll notify you of your urine results and make any changes if needed Call with any questions or concerns Hang in there!!!

## 2017-07-31 NOTE — Progress Notes (Signed)
   Subjective:    Patient ID: Tracy [name], female    DOB: 11-15-1950, 67 y.o.   MRN: [id number]  HPI Yeast- sxs started ~3 days ago.  Pt reports the 'itching is driving me crazy'.  Pt reports 'yellowish' vaginal d/c.  No burning w/ urination, frequency, urgency.  No recent abx.  Has not tried any OTC yeast products.  Osteoporosis- pt is not currently on treatment.  Pt is interested in Prolia b/c 'I did horribly on Fosamax'.  Review of Systems For ROS see HPI     Objective:   Physical Exam  Constitutional: She appears well-developed and well-nourished. No distress.  Genitourinary:  Genitourinary Comments: Pt not able to easily have a pelvic exam today b/c of shoulder surgery  Musculoskeletal:  R shoulder in sling  Vitals reviewed.         Assessment & Plan:  Vaginal itching- new.  sxs consistent w/ yeast infxn.  Will send urine cytology and in the meantime start Diflucan.  Reviewed supportive care and red flags that should prompt return.  Pt expressed understanding and is in agreement w/ plan.

## 2017-08-01 ENCOUNTER — Encounter: Payer: Self-pay | Admitting: Family Medicine

## 2017-08-01 DIAGNOSIS — M19011 Primary osteoarthritis, right shoulder: Secondary | ICD-10-CM | POA: Diagnosis not present

## 2017-08-01 DIAGNOSIS — Z4789 Encounter for other orthopedic aftercare: Secondary | ICD-10-CM | POA: Diagnosis not present

## 2017-08-03 LAB — URINE CYTOLOGY ANCILLARY ONLY
Bacterial vaginitis: NEGATIVE
Candida vaginitis: NEGATIVE

## 2017-08-16 ENCOUNTER — Ambulatory Visit (INDEPENDENT_AMBULATORY_CARE_PROVIDER_SITE_OTHER): Payer: Medicare Other | Admitting: General Practice

## 2017-08-16 DIAGNOSIS — M8580 Other specified disorders of bone density and structure, unspecified site: Secondary | ICD-10-CM | POA: Diagnosis not present

## 2017-08-16 MED ORDER — DENOSUMAB 60 MG/ML ~~LOC~~ SOSY
60.0000 mg | PREFILLED_SYRINGE | Freq: Once | SUBCUTANEOUS | Status: AC
  Administered 2017-08-16: 60 mg via SUBCUTANEOUS

## 2017-08-16 NOTE — Progress Notes (Signed)
Tracy [name] is a 67 y.o. female presents to the office today for Prolia injection, per physician's orders.  Prolia), 75mL),  IM was administered left arm today. Patient tolerated injection. Patient due for follow up labs/provider appt: no  Date due: not due, appt made No Patient next injection due:6 months appt made no  [name] L Kastin Cerda

## 2017-08-21 ENCOUNTER — Ambulatory Visit: Payer: Medicare Other | Admitting: Family Medicine

## 2017-08-21 ENCOUNTER — Other Ambulatory Visit: Payer: Self-pay

## 2017-08-21 ENCOUNTER — Encounter: Payer: Self-pay | Admitting: Family Medicine

## 2017-08-21 VITALS — BP 121/81 | HR 68 | Temp 98.6°F | Resp 17 | Ht 64.0 in | Wt 167.2 lb

## 2017-08-21 DIAGNOSIS — J209 Acute bronchitis, unspecified: Secondary | ICD-10-CM | POA: Diagnosis not present

## 2017-08-21 DIAGNOSIS — J329 Chronic sinusitis, unspecified: Secondary | ICD-10-CM

## 2017-08-21 DIAGNOSIS — B9689 Other specified bacterial agents as the cause of diseases classified elsewhere: Secondary | ICD-10-CM

## 2017-08-21 MED ORDER — ALBUTEROL SULFATE (2.5 MG/3ML) 0.083% IN NEBU
2.5000 mg | INHALATION_SOLUTION | Freq: Once | RESPIRATORY_TRACT | Status: AC
  Administered 2017-08-21: 2.5 mg via RESPIRATORY_TRACT

## 2017-08-21 MED ORDER — ALBUTEROL SULFATE HFA 108 (90 BASE) MCG/ACT IN AERS: 2.0000 | INHALATION_SPRAY | Freq: Four times a day (QID) | RESPIRATORY_TRACT | 2 refills | Status: DC | PRN

## 2017-08-21 MED ORDER — AMOXICILLIN 875 MG PO TABS: 875.0000 mg | ORAL_TABLET | Freq: Two times a day (BID) | ORAL | 0 refills | Status: DC

## 2017-08-21 MED ORDER — DM-GUAIFENESIN ER 30-600 MG PO TB12: 1.0000 | ORAL_TABLET | Freq: Two times a day (BID) | ORAL | 0 refills | Status: DC

## 2017-08-21 NOTE — Patient Instructions (Signed)
Follow up as needed or as scheduled Start the Amoxicillin twice daily- take w/ food Drink plenty of fluids Use the Mucinex DM twice daily to improve cough and congestion Use the albuterol inhaler- 2 puffs- every 4 hrs as needed for cough, shortness of breath, wheezing, chest tightness Call with any questions or concerns Hang in there!

## 2017-08-21 NOTE — Progress Notes (Signed)
   Subjective:    Patient ID: Tracy [name], female    DOB: 01-16-1951, 67 y.o.   MRN: [id number]  HPI Cough- no fevers, + sick contacts.  + sinus congestion, headache, pain behind eyes, dizziness, cough.  + chest soreness from severity of cough.  Has not taken any OTC Mucinex.  sxs started last week.  + tooth pain.  Pt stopped Claritin b/c it was not effective.   Review of Systems For ROS see HPI     Objective:   Physical Exam  Constitutional: She is oriented to person, place, and time. She appears well-developed and well-nourished. No distress.  HENT:  Head: Normocephalic and atraumatic.  Right Ear: Tympanic membrane normal.  Left Ear: Tympanic membrane normal.  Nose: Mucosal edema and rhinorrhea present. Right sinus exhibits maxillary sinus tenderness. Right sinus exhibits no frontal sinus tenderness. Left sinus exhibits maxillary sinus tenderness. Left sinus exhibits no frontal sinus tenderness.  Mouth/Throat: Uvula is midline and mucous membranes are normal. Posterior oropharyngeal erythema present. No oropharyngeal exudate.  Eyes: Pupils are equal, round, and reactive to light. Conjunctivae and EOM are normal.  Neck: Normal range of motion. Neck supple.  Cardiovascular: Normal rate, regular rhythm and normal heart sounds.  Pulmonary/Chest: Effort normal. No respiratory distress. She has wheezes (faint expiratory wheezes throughout- improved s/p neb tx).  + hacking cough  Lymphadenopathy:    She has no cervical adenopathy.  Neurological: She is alert and oriented to person, place, and time.  Vitals reviewed.         Assessment & Plan:  Bacterial sinusitis- new.  Pt's sxs and PE consistent w/ infxn.  Start abx.  Cough meds prn.  Reviewed supportive care and red flags that should prompt return.  Pt expressed understanding and is in agreement w/ plan.   Bronchitis- new.  Pt's expiratory wheezing improved s/p neb tx in office.  Start cough meds prn, albuterol prn.  Reviewed  supportive care and red flags that should prompt return.  Pt expressed understanding and is in agreement w/ plan.

## 2017-08-23 ENCOUNTER — Other Ambulatory Visit: Payer: Self-pay | Admitting: Family Medicine

## 2017-09-19 ENCOUNTER — Other Ambulatory Visit: Payer: Self-pay | Admitting: Family Medicine

## 2017-09-19 MED ORDER — PANTOPRAZOLE SODIUM 40 MG PO TBEC: 40.0000 mg | DELAYED_RELEASE_TABLET | Freq: Two times a day (BID) | ORAL | 0 refills | Status: DC

## 2017-09-22 DIAGNOSIS — M25511 Pain in right shoulder: Secondary | ICD-10-CM | POA: Diagnosis not present

## 2017-09-22 DIAGNOSIS — Z471 Aftercare following joint replacement surgery: Secondary | ICD-10-CM | POA: Diagnosis not present

## 2017-09-22 DIAGNOSIS — Z96611 Presence of right artificial shoulder joint: Secondary | ICD-10-CM | POA: Diagnosis not present

## 2017-09-24 ENCOUNTER — Encounter: Payer: Self-pay | Admitting: Family Medicine

## 2017-09-25 ENCOUNTER — Other Ambulatory Visit: Payer: Self-pay

## 2017-09-25 ENCOUNTER — Ambulatory Visit: Payer: Medicare Other | Admitting: Family Medicine

## 2017-09-25 ENCOUNTER — Encounter: Payer: Self-pay | Admitting: Family Medicine

## 2017-09-25 ENCOUNTER — Ambulatory Visit (INDEPENDENT_AMBULATORY_CARE_PROVIDER_SITE_OTHER): Payer: Medicare Other

## 2017-09-25 VITALS — BP 120/76 | HR 62 | Temp 98.1°F | Resp 16 | Ht 64.0 in | Wt 170.2 lb

## 2017-09-25 DIAGNOSIS — R51 Headache: Secondary | ICD-10-CM

## 2017-09-25 DIAGNOSIS — R519 Headache, unspecified: Secondary | ICD-10-CM

## 2017-09-25 DIAGNOSIS — G44319 Acute post-traumatic headache, not intractable: Secondary | ICD-10-CM | POA: Diagnosis not present

## 2017-09-25 DIAGNOSIS — S0990XA Unspecified injury of head, initial encounter: Secondary | ICD-10-CM | POA: Diagnosis not present

## 2017-09-25 DIAGNOSIS — S0033XA Contusion of nose, initial encounter: Secondary | ICD-10-CM | POA: Diagnosis not present

## 2017-09-25 DIAGNOSIS — S0011XA Contusion of right eyelid and periocular area, initial encounter: Secondary | ICD-10-CM | POA: Diagnosis not present

## 2017-09-25 DIAGNOSIS — W19XXXA Unspecified fall, initial encounter: Secondary | ICD-10-CM | POA: Diagnosis not present

## 2017-09-25 NOTE — Patient Instructions (Signed)
Please return in 2 weeks for recheck. Treat your headaches with tylenol.  Rest and eat well. Stay hydrated.   We will call you with an appointment for your brain CT and xrays.   If you have any questions or concerns, please don't hesitate to send me a message via MyChart or call the office at [phone]. Thank you for visiting with Tracy [name] today! It's our pleasure caring for you.

## 2017-09-25 NOTE — Progress Notes (Signed)
Subjective  CC:  Chief Complaint  Patient presents with  . Fall    Bad fall on 09/22/17, has really bad headaches behind eyes and back of head, takes Tylenol 1000 2 times daily, feels light headed all they time, right elbow swelling    HPI: Tracy [name] is a 67 y.o. female who presents to the office today to address the problems listed above in the chief complaint.  67 year old female presents for acute care visit.  Please see recent telephone notes.  Reports woke up and of the night to go to the bathroom and tripped over her suitcase that was in the middle of the floor.  Landed on her face outstretched arm, and right side.  She denies loss of consciousness but says that she fell hard.  Since she has experienced global diffuse mild to moderate headaches relieved with extra strength Tylenol twice a day, right face is sore swollen and bruised.  Right shoulder sore, right elbow is sore, has bruising beneath her right breast and has a sore knee.  Since the fall, she has seen Dr. Onnie Graham of orthopedics who has cleared her shoulder.  However she is concerned about a broken facial bone or nasal fracture, bleeding on the brain due to the headaches, and elbow.  She is very worried.  Having trouble sleeping.  Admits to feeling foggy and lightheaded.  Not thinking as clearly as normal.  Sleeping well.  No dysarthria, diplopia, paresis or unilateral headache.  She does admit to neck pain posteriorly.  Assessment  1. Fall, initial encounter   2. Acute post-traumatic headache, not intractable   3. Face pain      Plan   Fall: She likely has postconcussive headaches but given her mental status changes and persistent headaches, CT scan of brain ordered.  Check x-rays of orbits and zygomatic arch to rule out fracture.  Elbow seems to just have contusion, reassured.  Continue Tylenol, rest and will recheck in 2 weeks time.  Discussed red flags in detail.  Follow up: Return in about 2 weeks (around  10/09/2017) for recheck.   Orders Placed This Encounter  Procedures  . CT Head Wo Contrast  . DG Orbits  . DG Nasal Bones   No orders of the defined types were placed in this encounter.     I reviewed the patients updated PMH, FH, and SocHx.    Patient Active Problem List   Diagnosis Date Noted  . 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 Shoulder arthritis 03/01/2017  . Perforation of sigmoid colon due to diverticulitis 07/05/2016  . OA (osteoarthritis) of knee 08/17/2015  . 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 Subclinical hyperthyroidism 06/05/2014  . Trochanteric bursitis of left hip 04/01/2014  . Rosacea 03/26/2014  . Allergic rhinitis 02/19/2014  . Left hip pain 12/19/2013  . Atrophic vaginitis 10/07/2013  . Screening for malignant neoplasm of the cervix 01/12/2011  . Physical exam 01/12/2011  . HYPERTRIGLYCERIDEMIA 12/03/2009  . GERD 12/03/2009  . UTI'S, RECURRENT 12/03/2009  . LEG CRAMPS, NOCTURNAL 12/03/2009  . DIVERTICULITIS, HX OF 12/03/2009  . Depression with anxiety 11/05/2009  . Osteoporosis 11/05/2009   Current Meds  Medication Sig  . acetaminophen (TYLENOL) 500 MG tablet 2 tablets 2 (two) times daily.  Marland Kitchen albuterol (PROVENTIL HFA;VENTOLIN HFA) 108 (90 Base) MCG/ACT inhaler Inhale 2 puffs into the lungs every 6 (six) hours as needed for wheezing or shortness of breath.  . Calcium Carbonate-Vitamin D (CALCIUM 600+D PO) Take 1 tablet by  mouth 2 (two) times daily.  . Cholecalciferol (VITAMIN D) 2000 units tablet Take 2,000 Units by mouth daily.  Marland Kitchen doxycycline (VIBRAMYCIN) 100 MG capsule Take 100 mg by mouth 2 (two) times daily as needed (rosacea).  . fenofibrate 160 MG tablet TAKE 1 TABLET BY MOUTH  DAILY  . fluticasone (FLONASE) 50 MCG/ACT nasal spray USE 1 SPRAY INTO BOTH  NOSTRILS DAILY.  . Melatonin 5 MG TABS Take 5 mg by mouth at bedtime.  . Multiple Vitamin (MULTIVITAMIN WITH MINERALS) TABS tablet Take 1 tablet by mouth daily.  . pantoprazole  (PROTONIX) 40 MG tablet Take 1 tablet (40 mg total) by mouth 2 (two) times daily.  . polyethylene glycol (MIRALAX / GLYCOLAX) packet Take 17 g by mouth daily.  . Polyethylene Glycol 400 (BLINK TEARS OP) Place 1 drop into both eyes daily as needed (dry eyes).    Allergies: Patient is allergic to demerol [meperidine]; latex; other; tramadol; and vicodin [hydrocodone-acetaminophen]. Family History: Patient family history includes Bone cancer in her paternal grandfather; Hypertension in her mother; Liver disease in her father; Stroke in her mother. Social History:  Patient  reports that she has never smoked. She has never used smokeless tobacco. She reports that she does not drink alcohol or use drugs.  Review of Systems: Constitutional: Negative for fever malaise or anorexia Cardiovascular: negative for chest pain Respiratory: negative for SOB or persistent cough Gastrointestinal: negative for abdominal pain  Objective  Vitals: BP 120/76   Pulse 62   Temp 98.1 F (36.7 C) (Oral)   Resp 16   Ht 5\' 4"  (1.626 m)   Wt 170 lb 3.2 oz (77.2 kg)   SpO2 97%   BMI 29.21 kg/m  General: Patient appears worried, A&Ox3 HEENT: PEERL, conjunctiva normal, ecchymosis below right eye with mild tenderness of zygomatic arch, no step-off or crepitus.  Nasal bridge mildly swollen with ecchymosis, oropharynx moist,neck is supple Cardiovascular:  RRR without murmur or gallop.  Respiratory:  Good breath sounds bilaterally, CTAB with normal respiratory effort Skin:  Warm, no rashes Neuro exam: Cranial nerves II through XII are intact, nonfocal exam.  Mildly unsteady gait.  No ataxia.     Commons side effects, risks, benefits, and alternatives for medications and treatment plan prescribed today were discussed, and the patient expressed understanding of the given instructions. Patient is instructed to call or message via MyChart if he/she has any questions or concerns regarding our treatment plan. No barriers  to understanding were identified. We discussed Red Flag symptoms and signs in detail. Patient expressed understanding regarding what to do in case of urgent or emergency type symptoms.   Medication list was reconciled, printed and provided to the patient in AVS. Patient instructions and summary information was reviewed with the patient as documented in the AVS. This note was prepared with assistance of Dragon voice recognition software. Occasional wrong-word or sound-a-like substitutions may have occurred due to the inherent limitations of voice recognition software

## 2017-09-26 ENCOUNTER — Ambulatory Visit
Admission: RE | Admit: 2017-09-26 | Discharge: 2017-09-26 | Disposition: A | Discharge status: Home / Self Care | Payer: Medicare Other | Source: Ambulatory Visit | Attending: Family Medicine | Admitting: Family Medicine

## 2017-09-26 DIAGNOSIS — G44319 Acute post-traumatic headache, not intractable: Secondary | ICD-10-CM

## 2017-09-26 DIAGNOSIS — S0990XA Unspecified injury of head, initial encounter: Secondary | ICD-10-CM | POA: Diagnosis not present

## 2017-09-26 DIAGNOSIS — R51 Headache: Secondary | ICD-10-CM | POA: Diagnosis not present

## 2017-09-26 NOTE — Progress Notes (Signed)
Please call patient: I have reviewed his/her lab results. Brain CT is normal - no hemorrhage (bleeding) or fractures are identified. She should start to feel better in time. Treat headaches with rest, tylenol, keep hydrated and eat well. F/u as directed.

## 2017-09-29 DIAGNOSIS — Z471 Aftercare following joint replacement surgery: Secondary | ICD-10-CM | POA: Diagnosis not present

## 2017-09-29 DIAGNOSIS — Z96651 Presence of right artificial knee joint: Secondary | ICD-10-CM | POA: Diagnosis not present

## 2017-09-29 DIAGNOSIS — M1712 Unilateral primary osteoarthritis, left knee: Secondary | ICD-10-CM | POA: Diagnosis not present

## 2017-09-29 DIAGNOSIS — M1711 Unilateral primary osteoarthritis, right knee: Secondary | ICD-10-CM | POA: Diagnosis not present

## 2017-10-05 ENCOUNTER — Other Ambulatory Visit: Payer: Medicare Other

## 2017-10-09 ENCOUNTER — Encounter: Payer: Self-pay | Admitting: Family Medicine

## 2017-10-09 ENCOUNTER — Other Ambulatory Visit: Payer: Self-pay

## 2017-10-09 ENCOUNTER — Ambulatory Visit: Payer: Medicare Other | Admitting: Family Medicine

## 2017-10-09 VITALS — BP 120/70 | HR 65 | Ht 64.0 in | Wt 169.8 lb

## 2017-10-09 DIAGNOSIS — M7551 Bursitis of right shoulder: Secondary | ICD-10-CM

## 2017-10-09 DIAGNOSIS — W19XXXD Unspecified fall, subsequent encounter: Secondary | ICD-10-CM

## 2017-10-09 DIAGNOSIS — G44319 Acute post-traumatic headache, not intractable: Secondary | ICD-10-CM | POA: Diagnosis not present

## 2017-10-09 NOTE — Progress Notes (Signed)
Subjective  CC:  Chief Complaint  Patient presents with  . Follow-up    2 week followup after fall on 09/22/2017, Patient states she is doing much better    HPI: Tracy [name] is a 67 y.o. female who presents to the office today to address the problems listed above in the chief complaint.  See last note. Nl xrays and brain CT done for facial trauma and headaches. Now without headaches x 2 days and face feeling much better. Bruising has resolved. Right shoulder continues to hurt: has seen ortho x 2: he reassures that it is related to posttraumatic bursitis and recommended alleve.   No new sxs Assessment  1. Fall, subsequent encounter   2. Acute post-traumatic headache, not intractable   3. Acute bursitis of right shoulder      Plan   Fall, post concussive headaches:  Resolved. Feeling better. Thinking clearly. No further tx or eval needed.  Bursitis, shoulder: rec ice and nsaids. Should improve over time.   Follow up: Return if symptoms worsen or fail to improve.   No orders of the defined types were placed in this encounter.  No orders of the defined types were placed in this encounter.     I reviewed the patients updated PMH, FH, and SocHx.    Patient Active Problem List   Diagnosis Date Noted  . 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 Shoulder arthritis 03/01/2017  . Perforation of sigmoid colon due to diverticulitis 07/05/2016  . OA (osteoarthritis) of knee 08/17/2015  . 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 Subclinical hyperthyroidism 06/05/2014  . Trochanteric bursitis of left hip 04/01/2014  . Rosacea 03/26/2014  . Allergic rhinitis 02/19/2014  . Left hip pain 12/19/2013  . Atrophic vaginitis 10/07/2013  . Screening for malignant neoplasm of the cervix 01/12/2011  . Physical exam 01/12/2011  . HYPERTRIGLYCERIDEMIA 12/03/2009  . GERD 12/03/2009  . UTI'S, RECURRENT 12/03/2009  . LEG CRAMPS, NOCTURNAL 12/03/2009  . DIVERTICULITIS, HX OF 12/03/2009    . Depression with anxiety 11/05/2009  . Osteoporosis 11/05/2009   Current Meds  Medication Sig  . Calcium Carbonate-Vitamin D (CALCIUM 600+D PO) Take 1 tablet by mouth 2 (two) times daily.  . Cholecalciferol (VITAMIN D) 2000 units tablet Take 2,000 Units by mouth daily.  . fenofibrate 160 MG tablet TAKE 1 TABLET BY MOUTH  DAILY  . fluticasone (FLONASE) 50 MCG/ACT nasal spray USE 1 SPRAY INTO BOTH  NOSTRILS DAILY.  . Melatonin 5 MG TABS Take 5 mg by mouth at bedtime.  . Multiple Vitamin (MULTIVITAMIN WITH MINERALS) TABS tablet Take 1 tablet by mouth daily.  . naproxen (NAPROSYN) 250 MG tablet   . pantoprazole (PROTONIX) 40 MG tablet Take 1 tablet (40 mg total) by mouth 2 (two) times daily.  . polyethylene glycol (MIRALAX / GLYCOLAX) packet Take 17 g by mouth daily.  . Polyethylene Glycol 400 (BLINK TEARS OP) Place 1 drop into both eyes daily as needed (dry eyes).    Allergies: Patient is allergic to demerol [meperidine]; latex; other; tramadol; and vicodin [hydrocodone-acetaminophen]. Family History: Patient family history includes Bone cancer in her paternal grandfather; Hypertension in her mother; Liver disease in her father; Stroke in her mother. Social History:  Patient  reports that she has never smoked. She has never used smokeless tobacco. She reports that she does not drink alcohol or use drugs.  Review of Systems: Constitutional: Negative for fever malaise or anorexia Cardiovascular: negative for chest pain Respiratory: negative for SOB or persistent  cough Gastrointestinal: negative for abdominal pain  Objective  Vitals: BP 120/70   Pulse 65   Ht 5\' 4"  (1.626 m)   Wt 169 lb 12.8 oz (77 kg)   SpO2 99%   BMI 29.15 kg/m  General: no acute distress , A&Ox3,  HEENT: PEERL, conjunctiva normal, Oropharynx moist,neck is supple, minimal ttp over right zygomatic arch and nasal bridge Right shoulder: decreased rom (chronic), tender anteriorly.     Commons side effects,  risks, benefits, and alternatives for medications and treatment plan prescribed today were discussed, and the patient expressed understanding of the given instructions. Patient is instructed to call or message via MyChart if he/she has any questions or concerns regarding our treatment plan. No barriers to understanding were identified. We discussed Red Flag symptoms and signs in detail. Patient expressed understanding regarding what to do in case of urgent or emergency type symptoms.   Medication list was reconciled, printed and provided to the patient in AVS. Patient instructions and summary information was reviewed with the patient as documented in the AVS. This note was prepared with assistance of Dragon voice recognition software. Occasional wrong-word or sound-a-like substitutions may have occurred due to the inherent limitations of voice recognition software

## 2017-10-09 NOTE — Patient Instructions (Signed)
Please follow up if symptoms do not improve or as needed.   

## 2017-11-02 ENCOUNTER — Encounter: Payer: Self-pay | Admitting: Family Medicine

## 2017-11-02 ENCOUNTER — Telehealth: Payer: Self-pay | Admitting: Family Medicine

## 2017-11-02 ENCOUNTER — Ambulatory Visit: Payer: Medicare Other | Admitting: Family Medicine

## 2017-11-02 ENCOUNTER — Other Ambulatory Visit: Payer: Self-pay

## 2017-11-02 VITALS — BP 121/73 | HR 76 | Temp 98.1°F | Resp 16 | Ht 64.0 in | Wt 171.5 lb

## 2017-11-02 DIAGNOSIS — B354 Tinea corporis: Secondary | ICD-10-CM

## 2017-11-02 MED ORDER — CLOTRIMAZOLE 1 % EX OINT: TOPICAL_OINTMENT | CUTANEOUS | 1 refills | Status: DC

## 2017-11-02 MED ORDER — CLOTRIMAZOLE 1 % EX CREA: 1.0000 "application " | TOPICAL_CREAM | Freq: Two times a day (BID) | CUTANEOUS | 0 refills | Status: DC

## 2017-11-02 NOTE — Telephone Encounter (Signed)
Ok to switch to clotrimazole cream (generic)

## 2017-11-02 NOTE — Progress Notes (Signed)
   Subjective:    Patient ID: Tracy [name], female    DOB: 12/28/1950, 67 y.o.   MRN: [id number]  HPI Ringworm- pt got kittens and they had ringworm.  This was 3 weeks ago.  Pt got out of the shower last night and noticed a red, circular area on L breast.  Pt used left over miconazole cream last night.   Review of Systems For ROS see HPI     Objective:   Physical Exam  Constitutional: She appears well-developed and well-nourished. No distress.  Skin: Skin is warm and dry. There is erythema (well circumscribed erythematous lesion on L breast w/ heaped up edges consistent w/ ring worm).  Vitals reviewed.         Assessment & Plan:  Ringworm- new.  Reviewed dx and tx w/ pt.  Start topical Clotrimazole BID.  Pt expressed understanding and is in agreement w/ plan.

## 2017-11-02 NOTE — Telephone Encounter (Signed)
Copied from [place] [phone]. Topic: General - Other >> Nov 02, 2017  1:38 PM Lennox Solders wrote: Reason for CRM: [facility] is calling and  clotrimazole ointment only comes brand name. Does provider want generic [facility] [address]

## 2017-11-02 NOTE — Telephone Encounter (Signed)
Please advise 

## 2017-11-02 NOTE — Telephone Encounter (Signed)
New prescription for clotrimazole cream was sent to the pharmacy per PCP.

## 2017-11-02 NOTE — Patient Instructions (Signed)
Follow up as needed or as scheduled Apply the Clotrimazole cream twice daily (there is a prescription at the pharmacy but it is also available OTC) Call with any questions or concerns Happy Fall!!!

## 2017-11-07 ENCOUNTER — Other Ambulatory Visit: Payer: Self-pay | Admitting: Family Medicine

## 2017-11-27 NOTE — Progress Notes (Addendum)
Subjective:   Tracy [name] is a 67 y.o. female who presents for Medicare Annual (Subsequent) preventive examination.  Review of Systems:  No ROS.  Medicare Wellness Visit. Additional risk factors are reflected in the social history.  Cardiac Risk Factors include: advanced age (>59men, >36 women);dyslipidemia;sedentary lifestyle;family history of premature cardiovascular disease   Sleep patterns: Sleeps 7 hours. Takes Melatonin.  Home Safety/Smoke Alarms: Feels safe in home. Smoke alarms in place.  Living environment; residence and Firearm Safety: Lives with husband in 2 story home.  Seat Belt Safety/Bike Helmet: Wears seat belt.   Female:   Pap-N/A      Mammo-05/16/2017, negative.         Dexa scan-05/16/2017, Osteoporosis.        CCS-Colonoscopy 01/05/2017, diverticulosis. Recall 10 years  GI      Objective:     Vitals: There were no vitals taken for this visit.  There is no height or weight on file to calculate BMI.  Advanced Directives 11/28/2017 06/23/2017 06/23/2017 06/19/2017 01/05/2017 09/07/2016 08/11/2016  Does Patient Have a Medical Advance Directive? Yes Yes Yes Yes Yes Yes Yes  Type of Paramedic of [place];Living will Living will Living will Living will [place];Living will [place];Living will [place];Living will  Does patient want to make changes to medical advance directive? - No - Patient declined No - Patient declined No - Patient declined - - No - Patient declined  Copy of [place] in Chart? Yes - - - - Yes Yes  Would patient like information on creating a medical advance directive? - - - - - - -    Tobacco Social History   Tobacco Use  Smoking Status Never Smoker  Smokeless Tobacco Never Used     Counseling given: Not Answered    Past Medical History:  Diagnosis Date  . Allergy   . Anxiety   . Arthritis   . Bursitis of left hip   .  C. difficile diarrhea   . Cancer (HCC)    basil cell  . Cataract    bilateral  . Complication of anesthesia   . Diverticulitis   . GERD (gastroesophageal reflux disease)   . Hx: UTI (urinary tract infection)   . Hyperlipidemia   . Hyperthyroidism   . Osteopenia   . Osteoporosis   . PONV (postoperative nausea and vomiting)   . Rosacea   . Rotator cuff arthropathy, left    Past Surgical History:  Procedure Laterality Date  . APPENDECTOMY    . BACK SURGERY    . CHOLECYSTECTOMY    . FOOT SURGERY Right    X 2  . KNEE SURGERY    . LAPAROSCOPIC LIVER CYST FENESTRATION    . OPEN SURGICAL REPAIR OF GLUTEAL TENDON Left 04/01/2014   Procedure: LEFT HIP BURSECTOMY WITH GLUTEAL TENDON REPAIR;  Surgeon: Gearlean Alf, MD;  Location: [facility];  Service: Orthopedics;  Laterality: Left;  . REVERSE SHOULDER ARTHROPLASTY Right 06/23/2017   Procedure: RIGHT REVERSE SHOULDER ARTHROPLASTY;  Surgeon: Netta Cedars, MD;  Location: [facility];  Service: Orthopedics;  Laterality: Right;  . TONSILLECTOMY    . TOTAL KNEE ARTHROPLASTY Right 08/17/2015   Procedure: RIGHT TOTAL KNEE ARTHROPLASTY;  Surgeon: Gaynelle Arabian, MD;  Location: [facility];  Service: Orthopedics;  Laterality: Right;  . TUBAL LIGATION     Family History  Problem Relation Age of Onset  . Hypertension Mother   . Stroke  Mother   . Liver disease Father        alcohol related  . Bone cancer Paternal Grandfather   . Cancer Neg Hx   . Diabetes Neg Hx   . Colon cancer Neg Hx   . Esophageal cancer Neg Hx   . Pancreatic cancer Neg Hx   . Stomach cancer Neg Hx    Social History   Socioeconomic History  . Marital status: Married    Spouse name: Not on file  . Number of children: Not on file  . Years of education: Not on file  . Highest education level: Not on file  Occupational History  . Not on file  Social Needs  . Financial resource strain: Not on file  . Food insecurity:    Worry: Not on file    Inability: Not on file  .  Transportation needs:    Medical: Not on file    Non-medical: Not on file  Tobacco Use  . Smoking status: Never Smoker  . Smokeless tobacco: Never Used  Substance and Sexual Activity  . Alcohol use: No  . Drug use: No  . Sexual activity: Not Currently  Lifestyle  . Physical activity:    Days per week: Not on file    Minutes per session: Not on file  . Stress: Not on file  Relationships  . Social connections:    Talks on phone: Not on file    Gets together: Not on file    Attends religious service: Not on file    Active member of club or organization: Not on file    Attends meetings of clubs or organizations: Not on file    Relationship status: Not on file  Other Topics Concern  . Not on file  Social History Narrative  . Not on file    Outpatient Encounter Medications as of 11/28/2017  Medication Sig  . albuterol (PROVENTIL HFA;VENTOLIN HFA) 108 (90 Base) MCG/ACT inhaler Inhale 2 puffs into the lungs every 6 (six) hours as needed for wheezing or shortness of breath.  . Calcium Carbonate-Vitamin D (CALCIUM 600+D PO) Take 1 tablet by mouth 2 (two) times daily.  . Cholecalciferol (VITAMIN D) 2000 units tablet Take 2,000 Units by mouth daily.  . clotrimazole (LOTRIMIN) 1 % cream Apply 1 application topically 2 (two) times daily.  Marland Kitchen doxycycline (VIBRAMYCIN) 100 MG capsule Take 100 mg by mouth 2 (two) times daily as needed (rosacea).  . DULoxetine (CYMBALTA) 20 MG capsule Take 1 capsule (20 mg total) by mouth daily.  . fenofibrate 160 MG tablet TAKE 1 TABLET BY MOUTH  DAILY  . fluticasone (FLONASE) 50 MCG/ACT nasal spray USE 1 SPRAY INTO BOTH  NOSTRILS DAILY.  . Melatonin 5 MG TABS Take 5 mg by mouth at bedtime.  . metroNIDAZOLE (METROGEL) 0.75 % gel Apply topically.  . Multiple Vitamin (MULTIVITAMIN WITH MINERALS) TABS tablet Take 1 tablet by mouth daily.  . naproxen (NAPROSYN) 250 MG tablet   . pantoprazole (PROTONIX) 40 MG tablet TAKE 1 TABLET BY MOUTH TWO  TIMES DAILY  .  polyethylene glycol (MIRALAX / GLYCOLAX) packet Take 17 g by mouth daily.  . Polyethylene Glycol 400 (BLINK TEARS OP) Place 1 drop into both eyes daily as needed (dry eyes).   No facility-administered encounter medications on file as of 11/28/2017.     Activities of Daily Living In your present state of health, do you have any difficulty performing the following activities: 11/28/2017 11/28/2017  Hearing? N N  Vision?  N N  Difficulty concentrating or making decisions? N N  Walking or climbing stairs? Y N  Comment joint pain; balance -  Dressing or bathing? N N  Doing errands, shopping? N N  Preparing Food and eating ? N -  Using the Toilet? N -  In the past six months, have you accidently leaked urine? N -  Do you have problems with loss of bowel control? N -  Managing your Medications? N -  Managing your Finances? N -  Housekeeping or managing your Housekeeping? N -  Some recent data might be hidden    Patient Care Team: [name], MD as PCP - General [name], MD as Consulting Physician (Internal Medicine) Festus Aloe, MD as Consulting Physician (Urology) Gaynelle Arabian, MD as Consulting Physician (Orthopedic Surgery) Harriett Sine, MD as Consulting Physician (Dermatology) Mauri Pole, MD as Consulting Physician (Gastroenterology) Hortencia Pilar, MD as Consulting Physician (Surgery) Netta Cedars, MD as Consulting Physician (Orthopedic Surgery) Wylene Simmer, MD as Consulting Physician (Orthopedic Surgery)    Assessment:   This is a routine wellness examination for [facility].  Exercise Activities and Dietary recommendations Current Exercise Habits: The patient does not participate in regular exercise at present(maintaining household), Exercise limited by: orthopedic condition(s)   Diet (meal preparation, eat out, water intake, caffeinated beverages, dairy products, fruits and vegetables): Drinks water.   Breakfast: cereal, english  muffin, fruit; coffee/juice.  Lunch: skips Dinner: lean protein, starch and vegetables     Goals      Patient Stated   . <enter goal here> (pt-stated)     Feel normal again, begin walking again.     . patient states (pt-stated)     Would like to get back to meeting group of friends for lunch, become social again.       Other   . Increase physical activity     Increase activity by joining gym/water aerobics.        Fall Risk Fall Risk  11/28/2017 11/28/2017 06/01/2017 01/27/2017 11/24/2016  Falls in the past year? Yes No No No No  Comment tripped over suitcase - - - -  Number falls in past yr: 1 - - - -  Injury with Fall? No - - - -  Follow up Falls prevention discussed - - - -    Depression Screen PHQ 2/9 Scores 11/28/2017 11/28/2017 09/25/2017 06/01/2017  PHQ - 2 Score 0 0 0 0  PHQ- 9 Score - 0 0 0  Exception Documentation - - - -     Cognitive Function MMSE - Mini Mental State Exam 11/28/2017  Orientation to time 5  Orientation to Place 5  Registration 3  Attention/ Calculation 5  Recall 3  Language- name 2 objects 2  Language- repeat 1  Language- follow 3 step command 3  Language- read & follow direction 1  Write a sentence 1  Copy design 1  Total score 30        Immunization History  Administered Date(s) Administered  . Influenza Whole 11/28/2012  . Influenza, High Dose Seasonal PF 11/10/2014, 10/26/2015, 11/09/2016  . Influenza,inj,Quad PF,6+ Mos 12/09/2013, 10/30/2017  . Pneumococcal Conjugate-13 09/08/2014  . Pneumococcal Polysaccharide-23 07/16/2013  . Tdap 06/28/2012   Declines Shingrix.   Screening Tests Health Maintenance  Topic Date Due  . MAMMOGRAM  05/17/2018  . TETANUS/TDAP  06/29/2022  . COLONOSCOPY  01/06/2027  . INFLUENZA VACCINE  Completed  . DEXA SCAN  Completed  . Hepatitis C Screening  Completed  . PNA vac Low Risk Adult  Completed       Plan:     Call insurance regarding [place] doing brain stimulating  activities (puzzles, reading, adult coloring books, staying active) to keep memory sharp.   I have personally reviewed and noted the following in the patient's chart:   . Medical and social history . Use of alcohol, tobacco or illicit drugs  . Current medications and supplements . Functional ability and status . Nutritional status . Physical activity . Advanced directives . List of other physicians . Hospitalizations, surgeries, and ER visits in previous 12 months . Vitals . Screenings to include cognitive, depression, and falls . Referrals and appointments  In addition, I have reviewed and discussed with patient certain preventive protocols, quality metrics, and best practice recommendations. A written personalized care plan for preventive services as well as general preventive health recommendations were provided to patient.     Gerilyn Nestle, RN  11/28/2017  Reviewed documentation provided by RN and agree w/ above.  Annye Asa, MD

## 2017-11-28 ENCOUNTER — Ambulatory Visit (INDEPENDENT_AMBULATORY_CARE_PROVIDER_SITE_OTHER): Payer: Medicare Other | Admitting: Family Medicine

## 2017-11-28 ENCOUNTER — Other Ambulatory Visit: Payer: Self-pay

## 2017-11-28 ENCOUNTER — Encounter: Payer: Self-pay | Admitting: Family Medicine

## 2017-11-28 ENCOUNTER — Ambulatory Visit (INDEPENDENT_AMBULATORY_CARE_PROVIDER_SITE_OTHER): Payer: Medicare Other

## 2017-11-28 VITALS — BP 124/81 | HR 62 | Temp 98.4°F | Resp 16 | Ht 64.0 in | Wt 166.0 lb

## 2017-11-28 DIAGNOSIS — F418 Other specified anxiety disorders: Secondary | ICD-10-CM

## 2017-11-28 DIAGNOSIS — E663 Overweight: Secondary | ICD-10-CM | POA: Diagnosis not present

## 2017-11-28 DIAGNOSIS — L2489 Irritant contact dermatitis due to other agents: Secondary | ICD-10-CM

## 2017-11-28 DIAGNOSIS — E0590 Thyrotoxicosis, unspecified without thyrotoxic crisis or storm: Secondary | ICD-10-CM | POA: Diagnosis not present

## 2017-11-28 DIAGNOSIS — E781 Pure hyperglyceridemia: Secondary | ICD-10-CM | POA: Diagnosis not present

## 2017-11-28 DIAGNOSIS — Z0000 Encounter for general adult medical examination without abnormal findings: Secondary | ICD-10-CM

## 2017-11-28 DIAGNOSIS — R29898 Other symptoms and signs involving the musculoskeletal system: Secondary | ICD-10-CM

## 2017-11-28 DIAGNOSIS — L24A9 Irritant contact dermatitis due friction or contact with other specified body fluids: Secondary | ICD-10-CM

## 2017-11-28 DIAGNOSIS — J301 Allergic rhinitis due to pollen: Secondary | ICD-10-CM

## 2017-11-28 LAB — HEPATIC FUNCTION PANEL
ALBUMIN: 4.3 g/dL (ref 3.5–5.2)
ALK PHOS: 35 U/L — AB (ref 39–117)
ALT: 13 U/L (ref 0–35)
AST: 22 U/L (ref 0–37)
Bilirubin, Direct: 0.2 mg/dL (ref 0.0–0.3)
TOTAL PROTEIN: 6.7 g/dL (ref 6.0–8.3)
Total Bilirubin: 0.7 mg/dL (ref 0.2–1.2)

## 2017-11-28 LAB — TSH: TSH: 0.2 u[IU]/mL — AB (ref 0.35–4.50)

## 2017-11-28 LAB — T3, FREE: T3 FREE: 3.6 pg/mL (ref 2.3–4.2)

## 2017-11-28 LAB — LIPID PANEL
CHOLESTEROL: 162 mg/dL (ref 0–200)
HDL: 68 mg/dL (ref >39.00)
LDL Cholesterol: 76 mg/dL (ref 0–99)
NonHDL: 94.45
TRIGLYCERIDES: 92 mg/dL (ref 0.0–149.0)
Total CHOL/HDL Ratio: 2
VLDL: 18.4 mg/dL (ref 0.0–40.0)

## 2017-11-28 LAB — T4, FREE: FREE T4: 1.48 ng/dL (ref 0.60–1.60)

## 2017-11-28 MED ORDER — DULOXETINE HCL 20 MG PO CPEP: 20.0000 mg | ORAL_CAPSULE | Freq: Every day | ORAL | 3 refills | Status: DC

## 2017-11-28 MED ORDER — NYSTATIN 100000 UNIT/GM EX POWD: Freq: Three times a day (TID) | CUTANEOUS | 1 refills | Status: DC

## 2017-11-28 NOTE — Progress Notes (Signed)
   Subjective:    Patient ID: Tracy [name], female    DOB: March 18, 1950, 67 y.o.   MRN: [id number]  HPI Hyperlipidemia- chronic problem. On fenofibrate daily.  Pt is down 6 lbs since last visit.  No regular exercise due to chronic joint pain.  Overweight- pt's down 6 lbs, BMI now 28.5.  Pt has stopped eating lunch and is now eating 1/2 portions at dinner.  Pt is not exercising at this time.    Anxiety- 'I worry about things I can't control'.  Waking at 4am and unable to get back to sleep.  Had some relief w/ Cymbalta 20mg  daily- and this also improved her arthritis pain.  Leg heaviness- pt reports when she lies down, her feet will get 'very cold'.  After about an hour, her feet return to normal temperature but her legs remain heavy and 'tingly'.  Has to be very careful getting up at night b/c legs 'feel funny'.  Itching- occurring under breasts and in groin.  No rash present.   Review of Systems For ROS see HPI     Objective:   Physical Exam Vitals signs reviewed.  Constitutional:      General: She is not in acute distress.    Appearance: She is well-developed.  HENT:     Head: Normocephalic and atraumatic.  Eyes:     Conjunctiva/sclera: Conjunctivae normal.     Pupils: Pupils are equal, round, and reactive to light.  Neck:     Musculoskeletal: Normal range of motion and neck supple.     Thyroid: No thyromegaly.  Cardiovascular:     Rate and Rhythm: Normal rate and regular rhythm.     Heart sounds: Normal heart sounds. No murmur.  Pulmonary:     Effort: Pulmonary effort is normal. No respiratory distress.     Breath sounds: Normal breath sounds.  Abdominal:     General: There is no distension.     Palpations: Abdomen is soft.     Tenderness: There is no abdominal tenderness.  Lymphadenopathy:     Cervical: No cervical adenopathy.  Skin:    General: Skin is warm and dry.     Findings: No rash.  Neurological:     Mental Status: She is alert and oriented to person,  place, and time.  Psychiatric:        Behavior: Behavior normal.           Assessment & Plan:  Irritant dermatitis- new.  No rash present.  Start topical Nystatin powder.  Reviewed supportive care and red flags that should prompt return.  Pt expressed understanding and is in agreement w/ plan.   Leg Heaviness- no obvious cause.  Pt is concerned for possible circulation issue.  Refer to vascular for complete evaluation.  Pt expressed understanding and is in agreement w/ plan.

## 2017-11-28 NOTE — Patient Instructions (Addendum)
Call insurance regarding [place] doing brain stimulating activities (puzzles, reading, adult coloring books, staying active) to keep memory sharp.    Health Maintenance, Female Adopting a healthy lifestyle and getting preventive care can go a long way to promote health and wellness. Talk with your health care provider about what schedule of regular examinations is right for you. This is a good chance for you to check in with your provider about disease prevention and staying healthy. In between checkups, there are plenty of things you can do on your own. Experts have done a lot of research about which lifestyle changes and preventive measures are most likely to keep you healthy. Ask your health care provider for more information. Weight and diet Eat a healthy diet  Be sure to include plenty of vegetables, fruits, low-fat dairy products, and lean protein.  Do not eat a lot of foods high in solid fats, added sugars, or salt.  Get regular exercise. This is one of the most important things you can do for your health. ? Most adults should exercise for at least 150 minutes each week. The exercise should increase your heart rate and make you sweat (moderate-intensity exercise). ? Most adults should also do strengthening exercises at least twice a week. This is in addition to the moderate-intensity exercise.  Maintain a healthy weight  Body mass index (BMI) is a measurement that can be used to identify possible weight problems. It estimates body fat based on height and weight. Your health care provider can help determine your BMI and help you achieve or maintain a healthy weight.  For females 91 years of age and older: ? A BMI below 18.5 is considered underweight. ? A BMI of 18.5 to 24.9 is normal. ? A BMI of 25 to 29.9 is considered overweight. ? A BMI of 30 and above is considered obese.  Watch levels of cholesterol and blood lipids  You should start having your blood  tested for lipids and cholesterol at 69 years of age, then have this test every 5 years.  You may need to have your cholesterol levels checked more often if: ? Your lipid or cholesterol levels are high. ? You are older than 69 years of age. ? You are at high risk for heart disease.  Cancer screening Lung Cancer  Lung cancer screening is recommended for adults 25-38 years old who are at high risk for lung cancer because of a history of smoking.  A yearly low-dose CT scan of the lungs is recommended for people who: ? Currently smoke. ? Have quit within the past 15 years. ? Have at least a 30-pack-year history of smoking. A pack year is smoking an average of one pack of cigarettes a day for 1 year.  Yearly screening should continue until it has been 15 years since you quit.  Yearly screening should stop if you develop a health problem that would prevent you from having lung cancer treatment.  Breast Cancer  Practice breast self-awareness. This means understanding how your breasts normally appear and feel.  It also means doing regular breast self-exams. Let your health care provider know about any changes, no matter how small.  If you are in your 20s or 30s, you should have a clinical breast exam (CBE) by a health care provider every 1-3 years as part of a regular health exam.  If you are 79 or older, have a CBE every year. Also consider having a breast X-ray (mammogram) every year.  If you have a family history of breast cancer, talk to your health care provider about genetic screening.  If you are at high risk for breast cancer, talk to your health care provider about having an MRI and a mammogram every year.  Breast cancer gene (BRCA) assessment is recommended for women who have family members with BRCA-related cancers. BRCA-related cancers include: ? Breast. ? Ovarian. ? Tubal. ? Peritoneal cancers.  Results of the assessment will determine the need for genetic counseling and  BRCA1 and BRCA2 testing.  Cervical Cancer Your health care provider may recommend that you be screened regularly for cancer of the pelvic organs (ovaries, uterus, and vagina). This screening involves a pelvic examination, including checking for microscopic changes to the surface of your cervix (Pap test). You may be encouraged to have this screening done every 3 years, beginning at age 35.  For women ages 72-65, health care providers may recommend pelvic exams and Pap testing every 3 years, or they may recommend the Pap and pelvic exam, combined with testing for human papilloma virus (HPV), every 5 years. Some types of HPV increase your risk of cervical cancer. Testing for HPV may also be done on women of any age with unclear Pap test results.  Other health care providers may not recommend any screening for nonpregnant women who are considered low risk for pelvic cancer and who do not have symptoms. Ask your health care provider if a screening pelvic exam is right for you.  If you have had past treatment for cervical cancer or a condition that could lead to cancer, you need Pap tests and screening for cancer for at least 20 years after your treatment. If Pap tests have been discontinued, your risk factors (such as having a new sexual partner) need to be reassessed to determine if screening should resume. Some women have medical problems that increase the chance of getting cervical cancer. In these cases, your health care provider may recommend more frequent screening and Pap tests.  Colorectal Cancer  This type of cancer can be detected and often prevented.  Routine colorectal cancer screening usually begins at 69 years of age and continues through 69 years of age.  Your health care provider may recommend screening at an earlier age if you have risk factors for colon cancer.  Your health care provider may also recommend using home test kits to check for hidden blood in the stool.  A small camera  at the end of a tube can be used to examine your colon directly (sigmoidoscopy or colonoscopy). This is done to check for the earliest forms of colorectal cancer.  Routine screening usually begins at age 62.  Direct examination of the colon should be repeated every 5-10 years through 69 years of age. However, you may need to be screened more often if early forms of precancerous polyps or small growths are found.  Skin Cancer  Check your skin from head to toe regularly.  Tell your health care provider about any new moles or changes in moles, especially if there is a change in a mole's shape or color.  Also tell your health care provider if you have a mole that is larger than the size of a pencil eraser.  Always use sunscreen. Apply sunscreen liberally and repeatedly throughout the day.  Protect yourself by wearing long sleeves, pants, a wide-brimmed hat, and sunglasses whenever you are outside.  Heart disease, diabetes, and high blood pressure  High blood pressure causes heart disease and  the risk of stroke. High blood pressure is more likely to develop in: ? People who have blood pressure in the high end of the normal range (130-139/85-89 mm Hg). ? People who are overweight or obese. ? People who are African American.  If you are 18-39 years of age, have your blood pressure checked every 3-5 years. If you are 40 years of age or older, have your blood pressure checked every year. You should have your blood pressure measured twice-once when you are at a hospital or clinic, and once when you are not at a hospital or clinic. Record the average of the two measurements. To check your blood pressure when you are not at a hospital or clinic, you can use: ? An automated blood pressure machine at a pharmacy. ? A home blood pressure monitor.  If you are between 55 years and 79 years old, ask your health care provider if you should take aspirin to prevent strokes.  Have regular diabetes screenings. This  involves taking a blood sample to check your fasting blood sugar level. ? If you are at a normal weight and have a low risk for diabetes, have this test once every three years after 69 years of age. ? If you are overweight and have a high risk for diabetes, consider being tested at a younger age or more often. Preventing infection Hepatitis B  If you have a higher risk for hepatitis B, you should be screened for this virus. You are considered at high risk for hepatitis B if: ? You were born in a country where hepatitis B is common. Ask your health care provider which countries are considered high risk. ? Your parents were born in a high-risk country, and you have not been immunized against hepatitis B (hepatitis B vaccine). ? You have HIV or AIDS. ? You use needles to inject street drugs. ? You live with someone who has hepatitis B. ? You have had sex with someone who has hepatitis B. ? You get hemodialysis treatment. ? You take certain medicines for conditions, including cancer, organ transplantation, and autoimmune conditions.  Hepatitis C  Blood testing is recommended for: ? Everyone born from 1945 through 1965. ? Anyone with known risk factors for hepatitis C.  Sexually transmitted infections (STIs)  You should be screened for sexually transmitted infections (STIs) including gonorrhea and chlamydia if: ? You are sexually active and are younger than 69 years of age. ? You are older than 69 years of age and your health care provider tells you that you are at risk for this type of infection. ? Your sexual activity has changed since you were last screened and you are at an increased risk for chlamydia or gonorrhea. Ask your health care provider if you are at risk.  If you do not have HIV, but are at risk, it may be recommended that you take a prescription medicine daily to prevent HIV infection. This is called pre-exposure prophylaxis (PrEP). You are considered at risk if: ? You are  sexually active and do not regularly use condoms or know the HIV status of your partner(s). ? You take drugs by injection. ? You are sexually active with a partner who has HIV.  Talk with your health care provider about whether you are at high risk of being infected with HIV. If you choose to begin PrEP, you should first be tested for HIV. You should then be tested every 3 months for as long as you are taking PrEP.   Pregnancy  If you are premenopausal and you may become pregnant, ask your health care provider about preconception counseling.  If you may become pregnant, take 400 to 800 micrograms (mcg) of folic acid every day.  If you want to prevent pregnancy, talk to your health care provider about birth control (contraception). Osteoporosis and menopause  Osteoporosis is a disease in which the bones lose minerals and strength with aging. This can result in serious bone fractures. Your risk for osteoporosis can be identified using a bone density scan.  If you are 65 years of age or older, or if you are at risk for osteoporosis and fractures, ask your health care provider if you should be screened.  Ask your health care provider whether you should take a calcium or vitamin D supplement to lower your risk for osteoporosis.  Menopause may have certain physical symptoms and risks.  Hormone replacement therapy may reduce some of these symptoms and risks. Talk to your health care provider about whether hormone replacement therapy is right for you. Follow these instructions at home:  Schedule regular health, dental, and eye exams.  Stay current with your immunizations.  Do not use any tobacco products including cigarettes, chewing tobacco, or electronic cigarettes.  If you are pregnant, do not drink alcohol.  If you are breastfeeding, limit how much and how often you drink alcohol.  Limit alcohol intake to no more than 1 drink per day for nonpregnant women. One drink equals 12 ounces of  beer, 5 ounces of wine, or 1 ounces of hard liquor.  Do not use street drugs.  Do not share needles.  Ask your health care provider for help if you need support or information about quitting drugs.  Tell your health care provider if you often feel depressed.  Tell your health care provider if you have ever been abused or do not feel safe at home. This information is not intended to replace advice given to you by your health care provider. Make sure you discuss any questions you have with your health care provider. Document Released: 08/30/2010 Document Revised: 07/23/2015 Document Reviewed: 11/18/2014 Elsevier Interactive Patient Education  2018 [employer] Inc[employer]  

## 2017-11-28 NOTE — Patient Instructions (Signed)
Follow up in 1 month to recheck anxiety We'll notify you of your lab results and make any changes if needed START daily Claritin or Zyrtec to help w/ allergies and itching RESTART the Cymbalta once daily to help w/ pain and mood USE the powder 2-3x/day as needed for itching under breasts and in groin We'll call you with your vascular appt Continue to work on healthy food choices Call with any questions or concerns Hang in there!

## 2017-11-29 ENCOUNTER — Encounter: Payer: Self-pay | Admitting: Family Medicine

## 2017-11-29 ENCOUNTER — Encounter: Payer: Self-pay | Admitting: General Practice

## 2017-12-12 DIAGNOSIS — M19012 Primary osteoarthritis, left shoulder: Secondary | ICD-10-CM | POA: Diagnosis not present

## 2017-12-12 DIAGNOSIS — M25511 Pain in right shoulder: Secondary | ICD-10-CM | POA: Diagnosis not present

## 2017-12-29 ENCOUNTER — Encounter: Payer: Self-pay | Admitting: Family Medicine

## 2017-12-29 ENCOUNTER — Other Ambulatory Visit: Payer: Self-pay

## 2017-12-29 ENCOUNTER — Ambulatory Visit: Payer: Medicare Other | Admitting: Family Medicine

## 2017-12-29 VITALS — BP 124/80 | HR 63 | Temp 98.3°F | Resp 16 | Ht 64.0 in | Wt 161.1 lb

## 2017-12-29 DIAGNOSIS — F418 Other specified anxiety disorders: Secondary | ICD-10-CM

## 2017-12-29 MED ORDER — DULOXETINE HCL 30 MG PO CPEP: 30.0000 mg | ORAL_CAPSULE | Freq: Every day | ORAL | 3 refills | Status: DC

## 2017-12-29 NOTE — Assessment & Plan Note (Signed)
Somewhat improved since starting Cymbalta.  She was under the misconception that this medication would completely resolve all anxiety.  Discussed that this is not the case and if the medication did not allow her to feel, it is not the right medication.  Increase Cymbalta to 30mg  daily and monitor for improvement.

## 2017-12-29 NOTE — Progress Notes (Signed)
   Subjective:    Patient ID: Tracy [name], female    DOB: 03-24-1950, 67 y.o.   MRN: [id number]  HPI Anxiety- at last visit she restarted Cymbalta 20mg  daily.  She feels 'it's helping me quite a bit b/c I have a lot going on'.  Pt's youngest son was sober x68 months 'and then lost it'.  She reports she still gets stressed but 'i'm not constantly worrying about it'.  Pt wants to know if the medication will 'eventually take this all away' (in reference to anxiety)  'I hate to think where i'd be without the medication'.     Review of Systems For ROS see HPI     Objective:   Physical Exam  Constitutional: She is oriented to person, place, and time. She appears well-developed and well-nourished. No distress.  Neurological: She is alert and oriented to person, place, and time.  Vitals reviewed.         Assessment & Plan:

## 2017-12-29 NOTE — Patient Instructions (Signed)
Follow up by phone or MyChart ~1 month after increasing the Cymbalta to 30mg  daily Finish the 20mg  Cymbalta and when you pick up the new prescription, make sure it is 30mg  daily Call with any questions or concerns Happy Fall!!!

## 2018-01-19 ENCOUNTER — Encounter: Payer: Medicare Other | Admitting: Vascular Surgery

## 2018-01-19 ENCOUNTER — Encounter (HOSPITAL_COMMUNITY): Payer: Medicare Other

## 2018-01-22 ENCOUNTER — Other Ambulatory Visit: Payer: Self-pay

## 2018-01-22 DIAGNOSIS — M79606 Pain in leg, unspecified: Secondary | ICD-10-CM

## 2018-01-30 ENCOUNTER — Encounter (HOSPITAL_COMMUNITY): Payer: Medicare Other

## 2018-01-30 ENCOUNTER — Encounter: Payer: Medicare Other | Admitting: Vascular Surgery

## 2018-02-01 ENCOUNTER — Encounter: Payer: Self-pay | Admitting: Family Medicine

## 2018-02-01 DIAGNOSIS — M1712 Unilateral primary osteoarthritis, left knee: Secondary | ICD-10-CM | POA: Diagnosis not present

## 2018-02-03 ENCOUNTER — Encounter: Payer: Self-pay | Admitting: Family Medicine

## 2018-02-05 ENCOUNTER — Ambulatory Visit (INDEPENDENT_AMBULATORY_CARE_PROVIDER_SITE_OTHER): Payer: Medicare Other

## 2018-02-05 ENCOUNTER — Encounter: Payer: Self-pay | Admitting: Family Medicine

## 2018-02-05 DIAGNOSIS — M810 Age-related osteoporosis without current pathological fracture: Secondary | ICD-10-CM

## 2018-02-05 MED ORDER — DENOSUMAB 60 MG/ML ~~LOC~~ SOSY
60.0000 mg | PREFILLED_SYRINGE | Freq: Once | SUBCUTANEOUS | Status: AC
  Administered 2018-02-05: 60 mg via SUBCUTANEOUS

## 2018-02-05 NOTE — Progress Notes (Addendum)
Administered Prolia injection SubQ Lt arm. Pt tolerated well.   The above order is mine.  Annye Asa, MD

## 2018-02-14 ENCOUNTER — Other Ambulatory Visit: Payer: Self-pay | Admitting: Family Medicine

## 2018-03-13 ENCOUNTER — Other Ambulatory Visit: Payer: Self-pay

## 2018-03-13 ENCOUNTER — Ambulatory Visit: Payer: Medicare Other | Admitting: Vascular Surgery

## 2018-03-13 ENCOUNTER — Encounter: Payer: Self-pay | Admitting: Vascular Surgery

## 2018-03-13 ENCOUNTER — Ambulatory Visit (HOSPITAL_COMMUNITY)
Admission: RE | Admit: 2018-03-13 | Discharge: 2018-03-13 | Disposition: A | Discharge status: Home / Self Care | Payer: Medicare Other | Source: Ambulatory Visit | Attending: Vascular Surgery | Admitting: Vascular Surgery

## 2018-03-13 VITALS — BP 117/74 | HR 71 | Temp 98.2°F | Resp 16 | Ht 64.0 in | Wt 159.0 lb

## 2018-03-13 DIAGNOSIS — M79606 Pain in leg, unspecified: Secondary | ICD-10-CM | POA: Insufficient documentation

## 2018-03-13 NOTE — Progress Notes (Signed)
[facility] [place]  Patient name: Tracy [name] MRN: [id number] DOB: [birth year] Sex: female  REASON FOR CONSULT: Lower extremity pain  HPI: Tracy [name] is a 68 y.o. female, who is here today for discussion of lower extremity symptoms.  She has several components of this.  She reports that when she gets in bed at night she feels that both feet are like "blocks of ice".  This causes discomfort in the.  She does report that she rubs her feet she has some improvement.  She has no claudication type symptoms.  She has no history of lower extremity tissue loss or ulceration.  She also reports a separate feeling of typical nocturnal cramping and this is bilateral as well.  She does report occasional sensation of coldness in her hands.  Does report occasional redness color changes in her feet but does not note this in her hands.  She has had right total knee replacement.  Also has had 2 surgeries of bunions on her right foot and reports that these were both "botched".  Have a deformity in the toes of her right foot.  Past Medical History:  Diagnosis Date  . Allergy   . Anxiety   . Arthritis   . Bursitis of left hip   . C. difficile diarrhea   . Cancer (HCC)    basil cell  . Cataract    bilateral  . Complication of anesthesia   . Diverticulitis   . GERD (gastroesophageal reflux disease)   . Hx: UTI (urinary tract infection)   . Hyperlipidemia   . Hyperthyroidism   . Osteopenia   . Osteoporosis   . PONV (postoperative nausea and vomiting)   . Rosacea   . Rotator cuff arthropathy, left     Family History  Problem Relation Age of Onset  . Hypertension Mother   . Stroke Mother   . Liver disease Father        alcohol related  . Bone cancer Paternal Grandfather   . Cancer Neg Hx   . Diabetes Neg Hx   . Colon cancer Neg Hx   . Esophageal cancer Neg Hx   . Pancreatic cancer Neg Hx   . Stomach cancer Neg Hx      SOCIAL HISTORY: Social History   Socioeconomic History  . Marital status: Married    Spouse name: Not on file  . Number of children: Not on file  . Years of education: Not on file  . Highest education level: Not on file  Occupational History  . Not on file  Social Needs  . Financial resource strain: Not on file  . Food insecurity:    Worry: Not on file    Inability: Not on file  . Transportation needs:    Medical: Not on file    Non-medical: Not on file  Tobacco Use  . Smoking status: Never Smoker  . Smokeless tobacco: Never Used  Substance and Sexual Activity  . Alcohol use: No  . Drug use: No  . Sexual activity: Not Currently  Lifestyle  . Physical activity:    Days per week: Not on file    Minutes per session: Not on file  . Stress: Not on file  Relationships  . Social connections:    Talks on phone: Not on file    Gets together: Not on file    Attends religious service: Not on file    Active member of club or organization: Not on  file    Attends meetings of clubs or organizations: Not on file    Relationship status: Not on file  . Intimate partner violence:    Fear of current or ex partner: Not on file    Emotionally abused: Not on file    Physically abused: Not on file    Forced sexual activity: Not on file  Other Topics Concern  . Not on file  Social History Narrative  . Not on file    Allergies  Allergen Reactions  . Demerol [Meperidine] Nausea And Vomiting  . Latex Rash and Other (See Comments)    Reaction:  Blisters   . Other Itching    Nylon sheets, pt prefers cotton sheets   . Tramadol Nausea And Vomiting    Current Outpatient Medications  Medication Sig Dispense Refill  . albuterol (PROVENTIL HFA;VENTOLIN HFA) 108 (90 Base) MCG/ACT inhaler Inhale 2 puffs into the lungs every 6 (six) hours as needed for wheezing or shortness of breath. 1 Inhaler 2  . Calcium Carbonate-Vitamin D (CALCIUM 600+D PO) Take 1 tablet by mouth 2 (two) times  daily.    . Cholecalciferol (VITAMIN D) 2000 units tablet Take 2,000 Units by mouth daily.    . clotrimazole (LOTRIMIN) 1 % cream Apply 1 application topically 2 (two) times daily. 30 g 0  . doxycycline (VIBRAMYCIN) 100 MG capsule Take 100 mg by mouth 2 (two) times daily as needed (rosacea).    . DULoxetine (CYMBALTA) 30 MG capsule Take 1 capsule (30 mg total) by mouth daily. 30 capsule 3  . fenofibrate 160 MG tablet TAKE 1 TABLET BY MOUTH  DAILY 90 tablet 1  . fluticasone (FLONASE) 50 MCG/ACT nasal spray USE 1 SPRAY INTO BOTH  NOSTRILS DAILY. 32 g 6  . Melatonin 5 MG TABS Take 5 mg by mouth at bedtime.    . metroNIDAZOLE (METROGEL) 0.75 % gel Apply topically.    . Multiple Vitamin (MULTIVITAMIN WITH MINERALS) TABS tablet Take 1 tablet by mouth daily.    Marland Kitchen nystatin (MYCOSTATIN/NYSTOP) powder Apply topically 3 (three) times daily. 60 g 1  . pantoprazole (PROTONIX) 40 MG tablet TAKE 1 TABLET BY MOUTH TWO  TIMES DAILY 180 tablet 0  . polyethylene glycol (MIRALAX / GLYCOLAX) packet Take 17 g by mouth daily. 14 each 0  . Polyethylene Glycol 400 (BLINK TEARS OP) Place 1 drop into both eyes daily as needed (dry eyes).     No current facility-administered medications for this visit.     REVIEW OF SYSTEMS:  [X]  denotes positive finding, [ ]  denotes negative finding Cardiac  Comments:  Chest pain or chest pressure:    Shortness of breath upon exertion:    Short of breath when lying flat:    Irregular heart rhythm:        Vascular    Pain in calf, thigh, or hip brought on by ambulation: x  orthopedic  Pain in feet at night that wakes you up from your sleep:     Blood clot in your veins:    Leg swelling:  x       Pulmonary    Oxygen at home:    Productive cough:     Wheezing:         Neurologic    Sudden weakness in arms or legs:  x   Sudden numbness in arms or legs:  x   Sudden onset of difficulty speaking or slurred speech:    Temporary loss of vision in one eye:  Problems with  dizziness:         Gastrointestinal    Blood in stool:     Vomited blood:         Genitourinary    Burning when urinating:     Blood in urine:        Psychiatric    Major depression:  x       Hematologic    Bleeding problems:    Problems with blood clotting too easily:        Skin    Rashes or ulcers:        Constitutional    Fever or chills:      PHYSICAL EXAM: There were no vitals filed for this visit.  GENERAL: The patient is a well-nourished female, in no acute distress. The vital signs are documented above. CARDIOVASCULAR: Carotid arteries are without bruits bilaterally.  2+ radial pulses bilaterally.  1-2+ dorsalis pedis pulses bilaterally PULMONARY: There is good air exchange  ABDOMEN: Soft and non-tender  MUSCULOSKELETAL: There are no major deformities or cyanosis. NEUROLOGIC: No focal weakness or paresthesias are detected. SKIN: There are no ulcers or rashes noted. PSYCHIATRIC: The patient has a normal affect.  DATA:  Noninvasive vascular laboratory studies reveal normal ankle arm index and normal toe brachial index in both lower extremities  MEDICAL ISSUES: Had long discussion with patient regarding these findings.  At any evidence of lower extremity arterial insufficiency.  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  Explained this is why she has a cold sensation and occasional color changes as well.  Reassured her that this is not limb threatening.  There is no specific treatment regarding this.  Also explained this is not related to the nocturnal cramping which also is difficult to control but not dangerous.  She has questions regarding telangiectasia of her lower extremities and I explained that this is very common and 50% of women have telangiectasia of cosmetic concern.  She is comfortable with that no further treatment.  She was reassured with this discussion will see [name] again on an as-needed  basis   Rosetta Posner, MD [facility] Tel [phone] Pager [phone]

## 2018-03-26 ENCOUNTER — Encounter: Payer: Self-pay | Admitting: Family Medicine

## 2018-03-26 MED ORDER — DULOXETINE HCL 30 MG PO CPEP: 30.0000 mg | ORAL_CAPSULE | Freq: Every day | ORAL | 1 refills | Status: DC

## 2018-04-05 ENCOUNTER — Ambulatory Visit (INDEPENDENT_AMBULATORY_CARE_PROVIDER_SITE_OTHER): Payer: Medicare Other | Admitting: Family Medicine

## 2018-04-05 ENCOUNTER — Other Ambulatory Visit: Payer: Self-pay

## 2018-04-05 ENCOUNTER — Encounter: Payer: Self-pay | Admitting: Family Medicine

## 2018-04-05 VITALS — BP 121/80 | HR 64 | Temp 98.3°F | Resp 16 | Ht 64.0 in | Wt 163.0 lb

## 2018-04-05 DIAGNOSIS — R10812 Left upper quadrant abdominal tenderness: Secondary | ICD-10-CM | POA: Diagnosis not present

## 2018-04-05 NOTE — Patient Instructions (Signed)
Follow up by phone or MyChart tomorrow to let me know how you're feeling Your gut will respond to what you eat- so Sunday may have thrown things out of sorts Continue to drink plenty of water Consider Dulcolax or Milk of Magnesia if feeling constipated If the pain worsens, please let me know or go to the ER Please also let GI know when you have these episodes so that they can keep track Call with any questions or concerns Hang in there!!!

## 2018-04-05 NOTE — Progress Notes (Signed)
   Subjective:    Patient ID: Tracy [name], female    DOB: 01-29-1951, 68 y.o.   MRN: [id number]  HPI Abdominal tenderness- pt has hx of diverticulitis w/ perforation so gets very anxious regarding her gut.  Sunday ate 'a lot of cheese' and felt constipated on Monday and Tuesday.  Yesterday had 1 episode of 'explosive diarrhea' and since then has had small 'stringy' stools or small balls.  Pt feels some discomfort in LLQ.   Review of Systems For ROS see HPI     Objective:   Physical Exam Vitals signs reviewed.  Constitutional:      General: She is not in acute distress.    Appearance: She is well-developed. She is not ill-appearing.  HENT:     Head: Normocephalic and atraumatic.  Abdominal:     General: Abdomen is flat. Bowel sounds are normal. There is no distension.     Palpations: Abdomen is soft. There is no fluid wave, hepatomegaly, splenomegaly or mass.     Tenderness: There is abdominal tenderness (mild TTP over LUQ) in the left upper quadrant. There is no guarding or rebound. Negative signs include Murphy's sign, Rovsing's sign and McBurney's sign.  Neurological:     Mental Status: She is alert.           Assessment & Plan:  LUQ TTP- pt is very anxious about a diverticulitis recurrence after her last episode resulted in perforation.  She ate terribly on Sunday for the Super Bowl and was constipated and bloated on Monday and Tuesday.  Wednesday 1 episode of 'explosive diarrhea' and then back to constipation after eating cheesy chicken parm.  Discussed how diet can alter your bowels- resulting in gas, bloating, pain, pressure.  She is only very mildly TTP in LUQ which makes me less suspicious for diverticulitis.  We discussed that this is very early in the course of things and if it is diverticulitis, it will declare itself in the next day or so.  Encouraged her to contact GI each time she has an episode so they can follow along w/ her sxs.  Discussed the risks of repeat  imaging and use of unnecessary abx.  Pt elects to wait and see.  She is to reach out tomorrow and let us know how she is doing.

## 2018-04-06 ENCOUNTER — Encounter: Payer: Self-pay | Admitting: Family Medicine

## 2018-04-17 ENCOUNTER — Other Ambulatory Visit: Payer: Self-pay | Admitting: Family Medicine

## 2018-05-23 ENCOUNTER — Other Ambulatory Visit: Payer: Self-pay | Admitting: Family Medicine

## 2018-07-02 IMAGING — CT CT ABD-PELV W/ CM
2 of 5 series · 16 of 46 positions shown, 18 images · IV contrast (APPLIED)
Comparison: CT 09/03/2010 and 09/26/2007.

CLINICAL DATA: Diffuse low abdominal pain for 1 day with fever.
History of diverticulitis and ischemic colitis.

EXAM:
CT ABDOMEN AND PELVIS WITH CONTRAST
TECHNIQUE: Multidetector CT imaging of the abdomen and pelvis was performed
using the standard protocol following bolus administration of
intravenous contrast.
CONTRAST:  100mL P18ZXN-1MM IOPAMIDOL (P18ZXN-1MM) INJECTION 61%

[Series 2: axial st · axial · 0.98mm/px · z∈[-456,-46]mm · 13 of 92 slices shown, 15 images]
[im 5/92  soft-tissue]
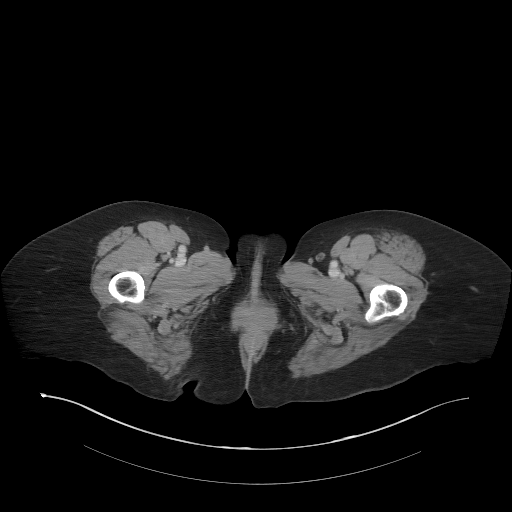
[im 5/92  bone]
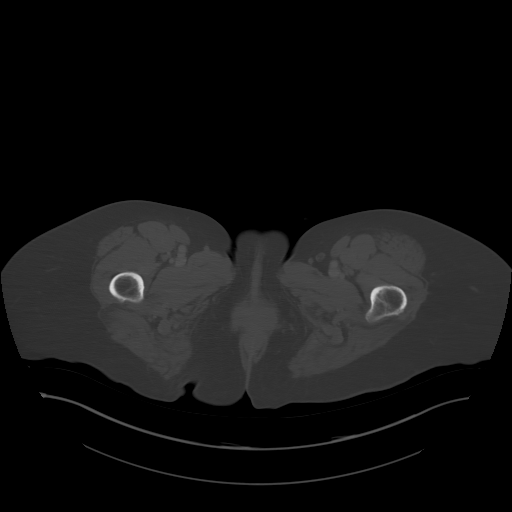
[im 14/92  soft-tissue]
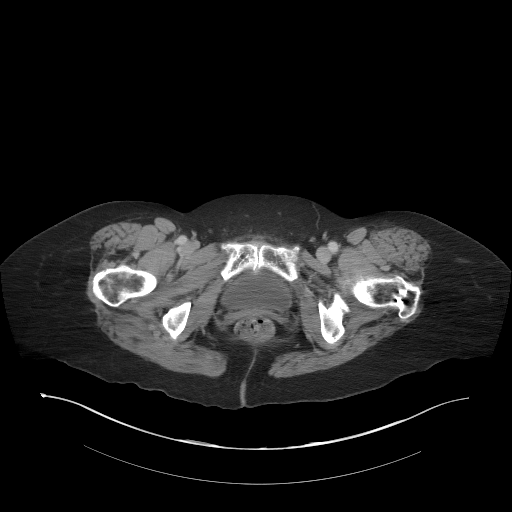
[im 19/92  soft-tissue]
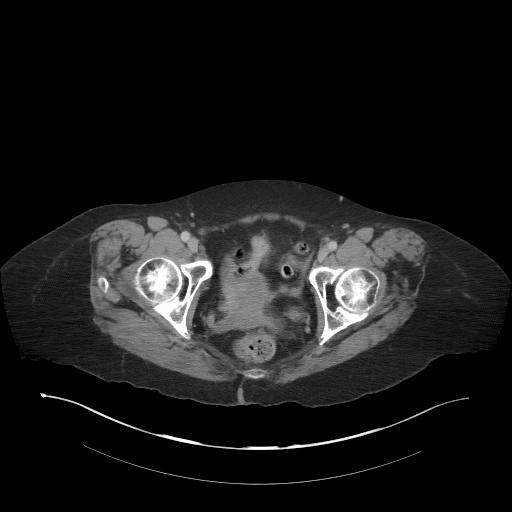
[im 28/92  soft-tissue]
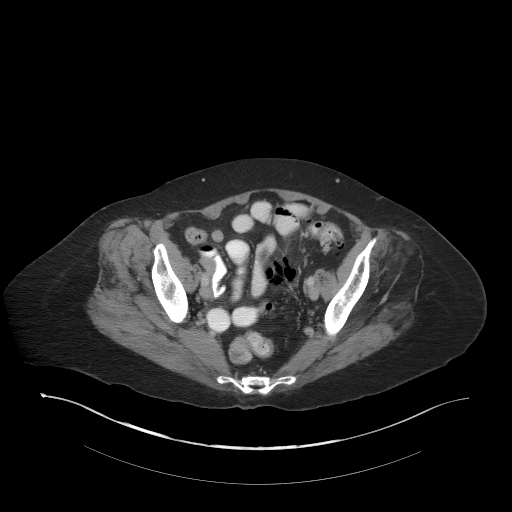
[im 32/92  soft-tissue]
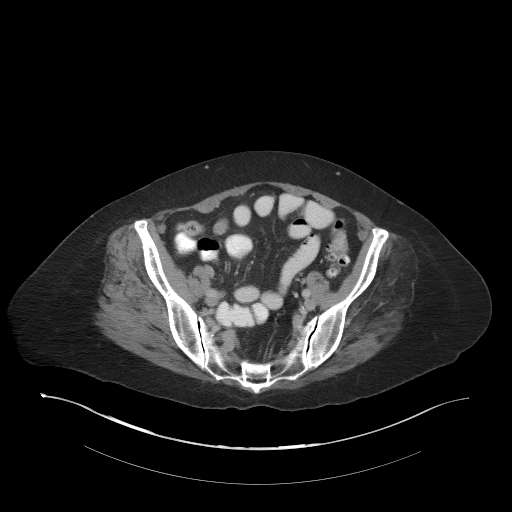
[im 41/92  soft-tissue]
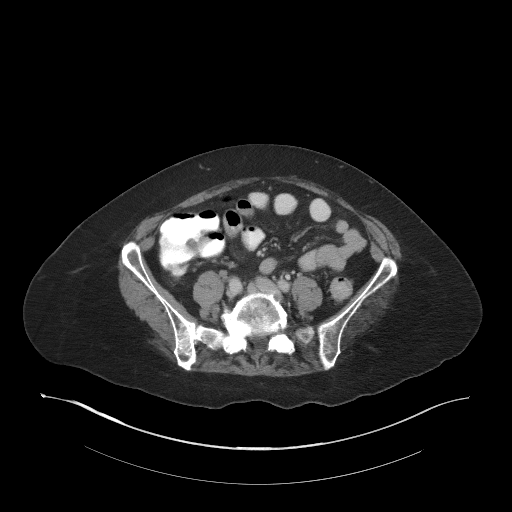
[im 46/92  soft-tissue]
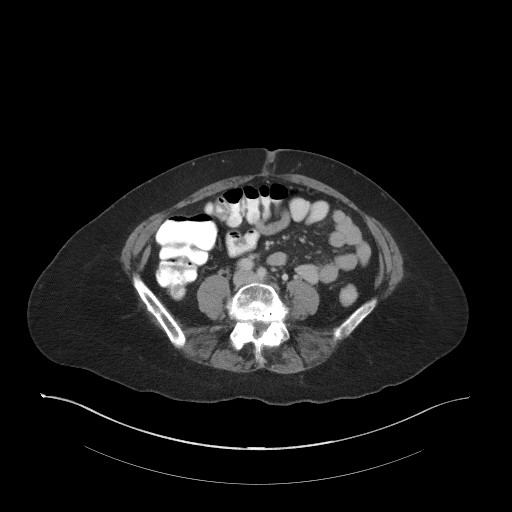
[im 51/92  soft-tissue]
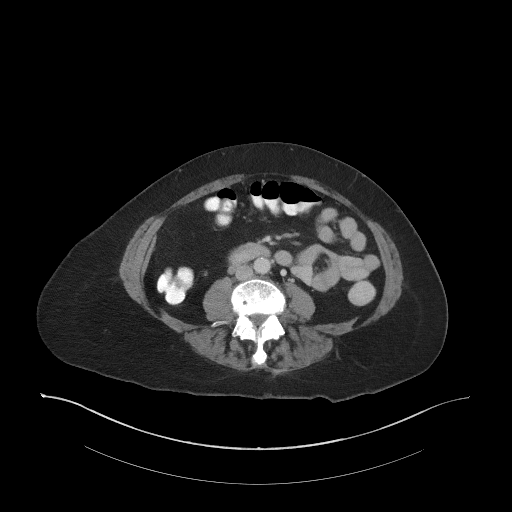
[im 60/92  soft-tissue]
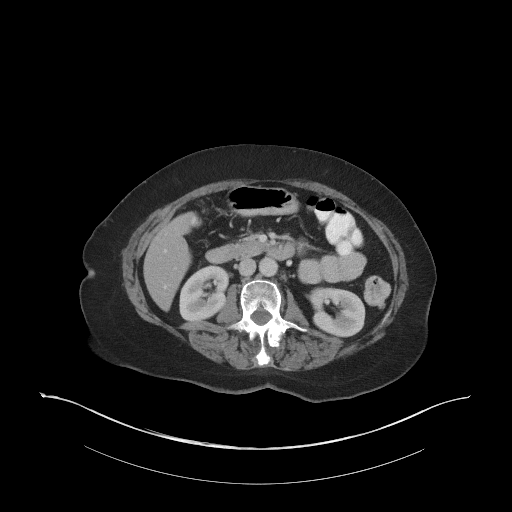
[im 60/92  bone]
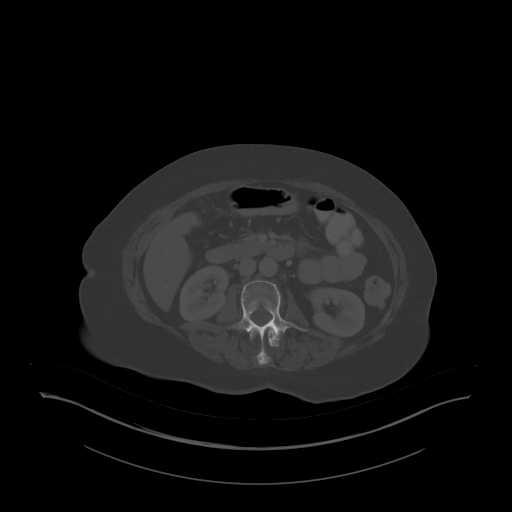
[im 64/92  soft-tissue]
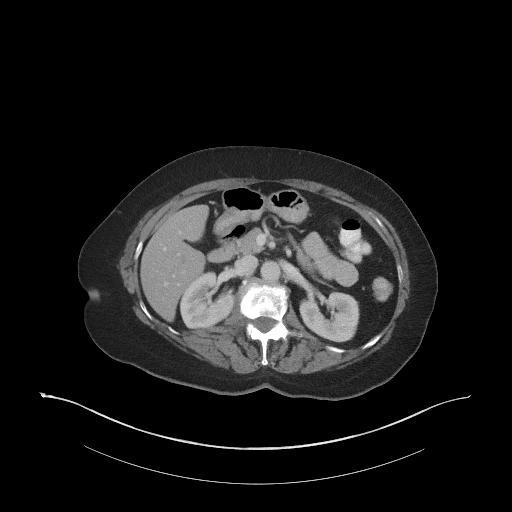
[im 73/92  soft-tissue]
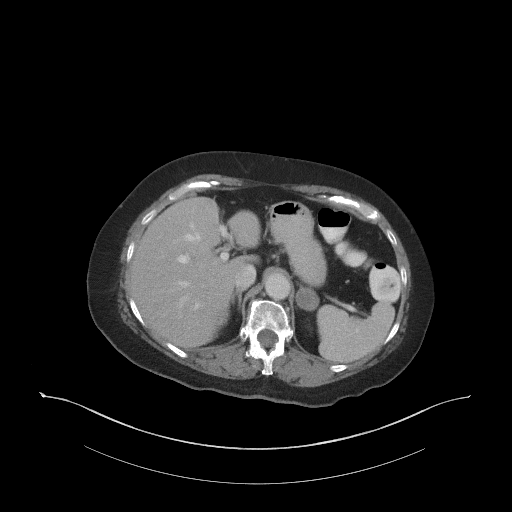
[im 78/92  soft-tissue]
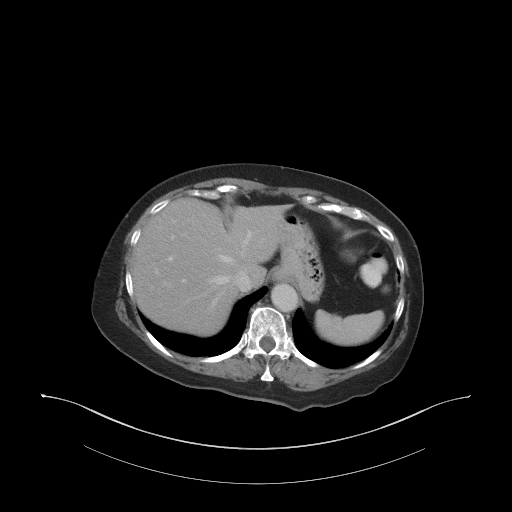
[im 87/92  soft-tissue]
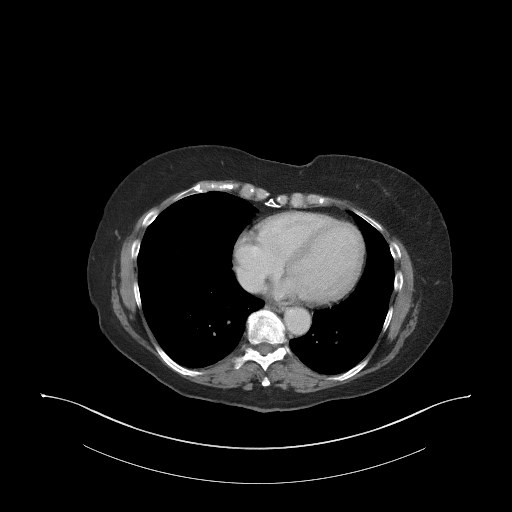

[Series 5: coronal st · coronal · 0.84mm/px · 3 of 87 slices shown]
[im 29/87  soft-tissue]
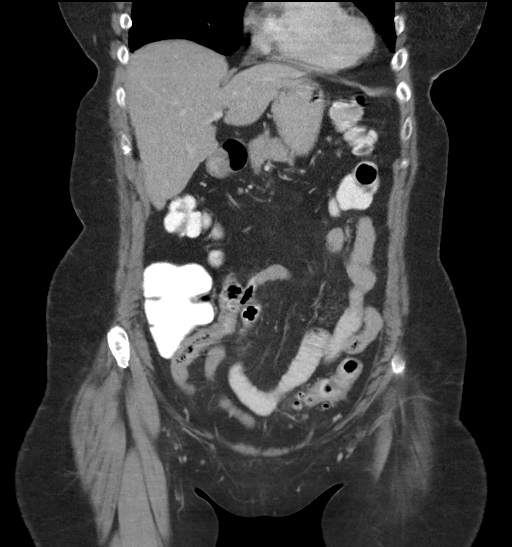
[im 39/87  soft-tissue]
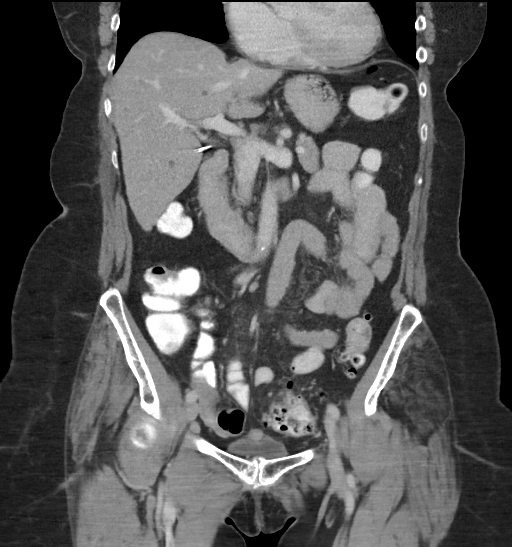
[im 48/87  soft-tissue]
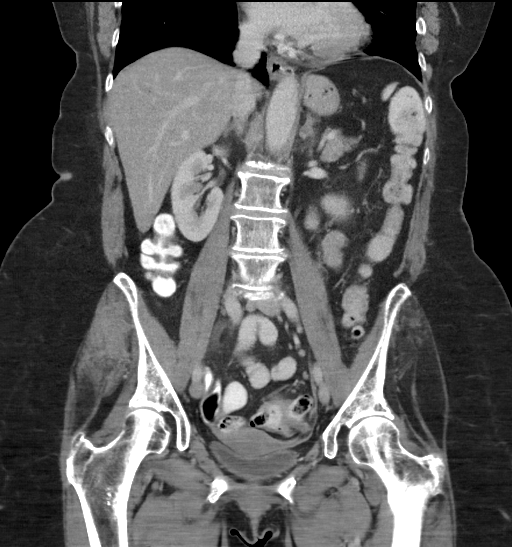

[16 of 46 positions shown; findings below may reference images not displayed]

FINDINGS: Lower chest: Clear lung bases. No significant pleural or pericardial
effusion.

Hepatobiliary: Scattered small, low-density hepatic lesions are
similar to the previous study. No worrisome hepatic findings. No
significant biliary dilatation post cholecystectomy.

Pancreas: Unremarkable. No pancreatic ductal dilatation or
surrounding inflammatory changes.

Spleen: Normal in size without focal abnormality.

Adrenals/Urinary Tract: Left adrenal nodule measuring 2.4 x 2.0 cm
(image 21) has mildly enlarged compared with the prior study at
which time it measured 2.0 x 1.6 cm. The right adrenal gland appears
normal. The kidneys appear normal without evidence of urinary tract
calculus, suspicious lesion or hydronephrosis. No bladder
abnormalities are seen.

Stomach/Bowel: Small hiatal hernia. The stomach, small bowel and
proximal colon appear normal. There are diverticular changes of the
descending and sigmoid colon. There is sigmoid colon wall thickening
and surrounding inflammation. In addition, there is a small amount
of surrounding extraluminal fluid and air (images 64-71). No
drainable fluid collection or free air. No evidence of bowel
obstruction.

Vascular/Lymphatic: There are no enlarged abdominal or pelvic lymph
nodes. Stable minimal aortic and branch vessel atherosclerosis.

Reproductive: The uterus and adnexa appear unremarkable.

Other: No evidence of abdominal wall mass or hernia. No ascites.

Musculoskeletal: No acute or significant osseous findings. There is
multilevel lumbar spondylosis. There is asymmetric left hip
arthropathy.
IMPRESSION: 1. Acute sigmoid diverticulitis with small contained micro
perforation manifesting as adjacent extraluminal air and fluid. No
drainable fluid collection or free air.
2. Although a left adrenal nodule has mildly enlarged from 8818,
this has been present since at least 7550 and is still likely an
adenoma.
3. Stable small low-density hepatic lesions.
4. These results will be called to the ordering clinician or
representative by the Radiologist Assistant, and communication
documented in the PACS or zVision Dashboard.

## 2018-07-03 ENCOUNTER — Other Ambulatory Visit: Payer: Self-pay | Admitting: Family Medicine

## 2018-07-17 DIAGNOSIS — Z96611 Presence of right artificial shoulder joint: Secondary | ICD-10-CM | POA: Diagnosis not present

## 2018-07-17 DIAGNOSIS — M25512 Pain in left shoulder: Secondary | ICD-10-CM | POA: Diagnosis not present

## 2018-07-17 DIAGNOSIS — M19012 Primary osteoarthritis, left shoulder: Secondary | ICD-10-CM | POA: Diagnosis not present

## 2018-07-17 DIAGNOSIS — Z471 Aftercare following joint replacement surgery: Secondary | ICD-10-CM | POA: Diagnosis not present

## 2018-07-25 ENCOUNTER — Encounter: Payer: Self-pay | Admitting: Family Medicine

## 2018-08-03 ENCOUNTER — Telehealth: Payer: Self-pay | Admitting: Family Medicine

## 2018-08-03 NOTE — Telephone Encounter (Signed)
Ok to schedule pt in office.

## 2018-08-03 NOTE — Telephone Encounter (Signed)
Closing encounter. Phone note in system.

## 2018-08-03 NOTE — Telephone Encounter (Signed)
Pt is asking for an appt for surgical clearance for her shoulder, pt states no COVID symptoms, please advise ok to schedule.

## 2018-08-03 NOTE — Telephone Encounter (Signed)
LM making pt aware that she needs to schedule an appt with Birdie Riddle

## 2018-08-03 NOTE — Telephone Encounter (Signed)
I have placed a preoperative form in the bin up front with a charge sheet.

## 2018-08-03 NOTE — Telephone Encounter (Signed)
[place] for surgical clearance in office if passes criteria

## 2018-08-03 NOTE — Telephone Encounter (Signed)
Please advise pt had EKG 05/2017. She would need to have this repeated correct? [place] for in office

## 2018-08-06 DIAGNOSIS — D1801 Hemangioma of skin and subcutaneous tissue: Secondary | ICD-10-CM | POA: Diagnosis not present

## 2018-08-06 DIAGNOSIS — L570 Actinic keratosis: Secondary | ICD-10-CM | POA: Diagnosis not present

## 2018-08-06 DIAGNOSIS — L820 Inflamed seborrheic keratosis: Secondary | ICD-10-CM | POA: Diagnosis not present

## 2018-08-06 DIAGNOSIS — L821 Other seborrheic keratosis: Secondary | ICD-10-CM | POA: Diagnosis not present

## 2018-08-06 DIAGNOSIS — Z85828 Personal history of other malignant neoplasm of skin: Secondary | ICD-10-CM | POA: Diagnosis not present

## 2018-08-06 DIAGNOSIS — D225 Melanocytic nevi of trunk: Secondary | ICD-10-CM | POA: Diagnosis not present

## 2018-08-07 ENCOUNTER — Encounter: Payer: Self-pay | Admitting: Family Medicine

## 2018-08-10 ENCOUNTER — Other Ambulatory Visit: Payer: Self-pay

## 2018-08-10 ENCOUNTER — Ambulatory Visit (INDEPENDENT_AMBULATORY_CARE_PROVIDER_SITE_OTHER): Payer: Medicare Other | Admitting: Family Medicine

## 2018-08-10 ENCOUNTER — Encounter: Payer: Self-pay | Admitting: Family Medicine

## 2018-08-10 VITALS — BP 126/83 | HR 76 | Temp 97.9°F | Resp 17 | Ht 64.0 in | Wt 155.5 lb

## 2018-08-10 DIAGNOSIS — W19XXXA Unspecified fall, initial encounter: Secondary | ICD-10-CM

## 2018-08-10 DIAGNOSIS — R7989 Other specified abnormal findings of blood chemistry: Secondary | ICD-10-CM

## 2018-08-10 DIAGNOSIS — R0602 Shortness of breath: Secondary | ICD-10-CM | POA: Diagnosis not present

## 2018-08-10 DIAGNOSIS — R42 Dizziness and giddiness: Secondary | ICD-10-CM

## 2018-08-10 DIAGNOSIS — H6121 Impacted cerumen, right ear: Secondary | ICD-10-CM | POA: Diagnosis not present

## 2018-08-10 DIAGNOSIS — I951 Orthostatic hypotension: Secondary | ICD-10-CM

## 2018-08-10 DIAGNOSIS — R251 Tremor, unspecified: Secondary | ICD-10-CM | POA: Diagnosis not present

## 2018-08-10 DIAGNOSIS — R413 Other amnesia: Secondary | ICD-10-CM

## 2018-08-10 DIAGNOSIS — R5383 Other fatigue: Secondary | ICD-10-CM | POA: Diagnosis not present

## 2018-08-10 LAB — BASIC METABOLIC PANEL
BUN: 26 mg/dL — ABNORMAL HIGH (ref 6–23)
CO2: 31 mEq/L (ref 19–32)
Calcium: 10.8 mg/dL — ABNORMAL HIGH (ref 8.4–10.5)
Chloride: 104 mEq/L (ref 96–112)
Creatinine, Ser: 0.94 mg/dL (ref 0.40–1.20)
GFR: 58.81 mL/min — ABNORMAL LOW (ref >60.00)
Glucose, Bld: 91 mg/dL (ref 70–99)
Potassium: 4 mEq/L (ref 3.5–5.1)
Sodium: 143 mEq/L (ref 135–145)

## 2018-08-10 LAB — CBC WITH DIFFERENTIAL/PLATELET
Basophils Absolute: 0.1 10*3/uL (ref 0.0–0.1)
Basophils Relative: 1.4 % (ref 0.0–3.0)
Eosinophils Absolute: 0.3 10*3/uL (ref 0.0–0.7)
Eosinophils Relative: 4.8 % (ref 0.0–5.0)
HCT: 42.5 % (ref 36.0–46.0)
Hemoglobin: 14.2 g/dL (ref 12.0–15.0)
Lymphocytes Relative: 26.4 % (ref 12.0–46.0)
Lymphs Abs: 1.6 10*3/uL (ref 0.7–4.0)
MCHC: 33.3 g/dL (ref 30.0–36.0)
MCV: 92.8 fl (ref 78.0–100.0)
Monocytes Absolute: 0.5 10*3/uL (ref 0.1–1.0)
Monocytes Relative: 8.8 % (ref 3.0–12.0)
Neutro Abs: 3.7 10*3/uL (ref 1.4–7.7)
Neutrophils Relative %: 58.6 % (ref 43.0–77.0)
Platelets: 255 10*3/uL (ref 150.0–400.0)
RBC: 4.58 Mil/uL (ref 3.87–5.11)
RDW: 12.8 % (ref 11.5–15.5)
WBC: 6.2 10*3/uL (ref 4.0–10.5)

## 2018-08-10 LAB — HEPATIC FUNCTION PANEL
ALT: 15 U/L (ref 0–35)
AST: 27 U/L (ref 0–37)
Albumin: 4.7 g/dL (ref 3.5–5.2)
Alkaline Phosphatase: 36 U/L — ABNORMAL LOW (ref 39–117)
Bilirubin, Direct: 0.1 mg/dL (ref 0.0–0.3)
Total Bilirubin: 0.5 mg/dL (ref 0.2–1.2)
Total Protein: 7 g/dL (ref 6.0–8.3)

## 2018-08-10 LAB — TSH: TSH: 0.2 u[IU]/mL — ABNORMAL LOW (ref 0.35–4.50)

## 2018-08-10 MED ORDER — DULOXETINE HCL 20 MG PO CPEP: 20.0000 mg | ORAL_CAPSULE | Freq: Every day | ORAL | 1 refills | Status: DC

## 2018-08-10 NOTE — Progress Notes (Signed)
Subjective:    Patient ID: Tracy [name], female    DOB: Apr 15, 1950, 68 y.o.   MRN: [id number]  HPI Pt comes today w/ a list of 12 unrelated concerns including fatigue, dizziness, SOB, palpitations, nightmares, insomnia, R hand tremor, profuse sweating, falls, exercise intolerance, feet feeling heavy, memory, R ear pain.  Biggest concern for pt is the dizziness and SOB going upstairs or walking up hill.  Pt reports this has resulted in a fall when coming in from outdoor exercising.  sxs do not occur when walking on a flat surface.  Denies CP.  Occasional nausea.  'heavy feet'- 'my husband says I don't pick my feet up when I walk'.  Husband feels this is contributing to her tripping fairly frequently.  R hand tremor- pt reports shaking occurs when holding something in R hand such as a cup or trying to take a picture.  Never had tremor until started generic Cymbalta.  No tremor on L.    Pt is tearful that she cannot remember things I'm telling her   Review of Systems For ROS see HPI     Objective:   Physical Exam Vitals signs reviewed.  Constitutional:      General: She is not in acute distress.    Appearance: She is well-developed. She is not ill-appearing or diaphoretic.  HENT:     Head: Normocephalic and atraumatic.     Comments: L TM WNL R TM initially obscured by cerumen but this was successfully removed w/ curette and irrigation Eyes:     Conjunctiva/sclera: Conjunctivae normal.     Pupils: Pupils are equal, round, and reactive to light.  Neck:     Musculoskeletal: Normal range of motion and neck supple.     Thyroid: No thyromegaly.  Cardiovascular:     Rate and Rhythm: Normal rate and regular rhythm.     Heart sounds: Normal heart sounds. No murmur.  Pulmonary:     Effort: Pulmonary effort is normal. No respiratory distress.     Breath sounds: Normal breath sounds.  Abdominal:     General: There is no distension.     Palpations: Abdomen is soft.     Tenderness:  There is no abdominal tenderness.  Lymphadenopathy:     Cervical: No cervical adenopathy.  Skin:    General: Skin is warm and dry.  Neurological:     Mental Status: She is alert and oriented to person, place, and time.     Cranial Nerves: No cranial nerve deficit.     Comments: Obvious short term memory deficit R hand tremor noted when clenching fist  Psychiatric:        Mood and Affect: Mood is anxious.        Behavior: Behavior normal.           Assessment & Plan:  Dizziness- pt's sxs sound consistent w/ orthostasis.  She has orthostatic vitals here in office and I suspect these are worse when she is exercising.  Reviewed need for increased fluids and salt.  She is not to exercise during the hottest parts of the day.  Will check labs to r/o thyroid and electrolyte disturbance.  EKG WNL.  If no improvement, will need neuro referral.  SOB- unclear if this is due to deconditioning or underlying issue.  EKG WNL.  Only occurs w/ stairs or going up hill.  Will check labs to r/o anemia.  Will get stress test to assess cardiac function prior to upcoming surgery.  Pt  expressed understanding and is in agreement w/ plan.   Cerumen impaction- pt's R ear discomfort improved immediately upon cerumen removal after consenting to irrigation and use of curette.  Tolerated procedure w/o difficulty.  Tremor- pt reports she never had a tremor until she was on Cymbalta.  She is interested in weaning this medication and watching for improvement.  Discussed option for neuro referral.  She declines at this time.  Falls- pt reports it's bc her 'legs feel heavy' and she is not lifting them as she should.  Has had vascular evaluation.  Feels sxs worsened w/ Cymbalta.  Will wean at pt's request.  Again, offered neuro referral.  Pt declined.  Will follow.  Fatigue- likely multifactorial.  Will check labs to r/o thyroid, anemia, electrolyte disturbance.  Will follow.  Memory loss- pt was not able to remember our  plan of action just minutes after discussing it with her.  I will refer for neuropsych testing for complete evaluation.  Pt expressed understanding and is in agreement w/ plan.

## 2018-08-10 NOTE — Patient Instructions (Addendum)
Follow up as scheduled on 6/19 We'll notify you of your lab results and make any changes if needed We'll call you with your stress test and your NeuroPsych testing appts Continue to drink plenty of fluids, eat salt, and change positions slowly Your BP drops 14 points and your pulse drops 18 points AVOID exercising in the heat of the day- early or late is better DECREASE Cymbalta to 20mg  daily x1 month and then every other day for 2 weeks and then stop Call with any questions or concerns Hang in there!

## 2018-08-13 ENCOUNTER — Other Ambulatory Visit: Payer: Medicare Other

## 2018-08-13 ENCOUNTER — Other Ambulatory Visit: Payer: Self-pay

## 2018-08-13 DIAGNOSIS — R7989 Other specified abnormal findings of blood chemistry: Secondary | ICD-10-CM

## 2018-08-13 DIAGNOSIS — R5383 Other fatigue: Secondary | ICD-10-CM | POA: Diagnosis not present

## 2018-08-13 LAB — T3, FREE: T3, Free: 2.9 pg/mL (ref 2.3–4.2)

## 2018-08-13 LAB — T4, FREE: Free T4: 1.5 ng/dL (ref 0.8–1.8)

## 2018-08-13 NOTE — Addendum Note (Signed)
Addended by: Doran Clay A on: 08/13/2018 10:46 AM   Modules accepted: Orders

## 2018-08-13 NOTE — Addendum Note (Signed)
Addended by: Doran Clay A on: 08/13/2018 10:47 AM   Modules accepted: Orders

## 2018-08-15 ENCOUNTER — Telehealth: Payer: Self-pay | Admitting: Family Medicine

## 2018-08-15 NOTE — Telephone Encounter (Signed)
Ok to send referral to new provider?

## 2018-08-15 NOTE — Telephone Encounter (Signed)
Per Diane at [facility] They do not do NEUROPSYCHOLOGICAL TESTING, she states that Norton Pastel with [facility] does this. Can I send the order to that office?  Phone # is [phone]

## 2018-08-15 NOTE — Telephone Encounter (Signed)
Ok to send to new provider

## 2018-08-16 ENCOUNTER — Telehealth: Payer: Self-pay | Admitting: General Practice

## 2018-08-16 ENCOUNTER — Other Ambulatory Visit: Payer: Self-pay | Admitting: Family Medicine

## 2018-08-16 ENCOUNTER — Ambulatory Visit: Payer: Medicare Other | Admitting: Family Medicine

## 2018-08-16 DIAGNOSIS — Z01818 Encounter for other preprocedural examination: Secondary | ICD-10-CM

## 2018-08-16 DIAGNOSIS — R42 Dizziness and giddiness: Secondary | ICD-10-CM

## 2018-08-16 DIAGNOSIS — I951 Orthostatic hypotension: Secondary | ICD-10-CM

## 2018-08-16 DIAGNOSIS — R0602 Shortness of breath: Secondary | ICD-10-CM

## 2018-08-16 NOTE — Telephone Encounter (Signed)
Pt should not need to see cardiologist for testing. If there is an issue can make note for future referral.

## 2018-08-16 NOTE — Telephone Encounter (Signed)
I have LM at [facility] for a call back to get pt scheduled for an appt/ ELEA

## 2018-08-16 NOTE — Telephone Encounter (Signed)
Made pt aware, she would like to see Daneen Schick MD with [facility] cardiology

## 2018-08-16 NOTE — Telephone Encounter (Signed)
Called pt and LMOVM to advise that per Dr. Birdie Riddle there was no need to come into appt today. We do not have pt Prolia in office and pt cannot be cleared for surgical clearance until she has seen cardiology for her Exercise Tolerance Test.   New order for ETT was placed today as well.

## 2018-08-17 ENCOUNTER — Ambulatory Visit: Payer: Medicare Other | Admitting: Family Medicine

## 2018-08-18 ENCOUNTER — Encounter: Payer: Self-pay | Admitting: Family Medicine

## 2018-08-18 DIAGNOSIS — Z1231 Encounter for screening mammogram for malignant neoplasm of breast: Secondary | ICD-10-CM | POA: Diagnosis not present

## 2018-08-18 DIAGNOSIS — Z1239 Encounter for other screening for malignant neoplasm of breast: Secondary | ICD-10-CM | POA: Diagnosis not present

## 2018-08-18 LAB — HM MAMMOGRAPHY

## 2018-08-21 ENCOUNTER — Encounter: Payer: Self-pay | Admitting: General Practice

## 2018-09-02 ENCOUNTER — Encounter: Payer: Self-pay | Admitting: Family Medicine

## 2018-09-05 ENCOUNTER — Other Ambulatory Visit: Payer: Self-pay

## 2018-09-05 ENCOUNTER — Ambulatory Visit (INDEPENDENT_AMBULATORY_CARE_PROVIDER_SITE_OTHER): Payer: Medicare Other

## 2018-09-05 DIAGNOSIS — M810 Age-related osteoporosis without current pathological fracture: Secondary | ICD-10-CM

## 2018-09-05 MED ORDER — DENOSUMAB 60 MG/ML ~~LOC~~ SOSY
60.0000 mg | PREFILLED_SYRINGE | Freq: Once | SUBCUTANEOUS | Status: AC
  Administered 2018-09-05: 60 mg via SUBCUTANEOUS

## 2018-09-05 NOTE — Progress Notes (Addendum)
Tracy [name] is a 68 y.o. female presents to the office today for Prolia injections, per physician's orders. Prolia 60mg /ml SQ was administered left arm today. Patient tolerated injection. Patient due for follow up labs/provider appt: No.  Patient next injection due: 6 months, appt made No  Gerilyn Nestle  Above order is mine.  Annye Asa, MD

## 2018-09-05 NOTE — Addendum Note (Signed)
Addended by: [name] on: 09/05/2018 01:34 PM   Modules accepted: Level of Service

## 2018-09-18 ENCOUNTER — Encounter: Payer: Self-pay | Admitting: Family Medicine

## 2018-09-18 DIAGNOSIS — R42 Dizziness and giddiness: Secondary | ICD-10-CM

## 2018-09-18 DIAGNOSIS — I951 Orthostatic hypotension: Secondary | ICD-10-CM

## 2018-09-18 DIAGNOSIS — R0602 Shortness of breath: Secondary | ICD-10-CM

## 2018-09-20 ENCOUNTER — Ambulatory Visit: Payer: Self-pay | Admitting: Cardiology

## 2018-10-02 ENCOUNTER — Other Ambulatory Visit: Payer: Self-pay | Admitting: Family Medicine

## 2018-10-10 ENCOUNTER — Other Ambulatory Visit: Payer: Self-pay

## 2018-10-10 ENCOUNTER — Ambulatory Visit (INDEPENDENT_AMBULATORY_CARE_PROVIDER_SITE_OTHER): Payer: Medicare Other | Admitting: Cardiovascular Disease

## 2018-10-10 ENCOUNTER — Encounter: Payer: Self-pay | Admitting: Cardiovascular Disease

## 2018-10-10 VITALS — BP 156/83 | HR 85 | Ht 64.0 in | Wt 160.0 lb

## 2018-10-10 DIAGNOSIS — Z01818 Encounter for other preprocedural examination: Secondary | ICD-10-CM | POA: Diagnosis not present

## 2018-10-10 DIAGNOSIS — Z01812 Encounter for preprocedural laboratory examination: Secondary | ICD-10-CM | POA: Diagnosis not present

## 2018-10-10 DIAGNOSIS — R0609 Other forms of dyspnea: Secondary | ICD-10-CM | POA: Diagnosis not present

## 2018-10-10 DIAGNOSIS — Z01810 Encounter for preprocedural cardiovascular examination: Secondary | ICD-10-CM | POA: Diagnosis not present

## 2018-10-10 MED ORDER — METOPROLOL TARTRATE 100 MG PO TABS: 100.0000 mg | ORAL_TABLET | Freq: Once | ORAL | 0 refills | Status: DC

## 2018-10-10 NOTE — Patient Instructions (Addendum)
Your cardiac CT will be scheduled at one of the below locations:   [facility] [address] [place], [place] [address] (336) [place] [address] [place], [place] [address] [phone]  Please arrive at the [facility] main entrance of [facility] 30-45 minutes prior to test start time. Proceed to the [facility] Radiology Department (first floor) to check-in and test prep.  Lab work: Your physician recommends that you return for lab work 3-7 days prior to your coronary CTA: BASIC METABOLIC PANEL; COMPLETE BLOOD COUNT  If you have labs (blood work) drawn today and your tests are completely normal, you will receive your results only by: Marland Kitchen MyChart Message (if you have MyChart) OR . A paper copy in the mail If you have any lab test that is abnormal or we need to change your treatment, we will call you to review the results.   Please follow these instructions carefully (unless otherwise directed):  On the Night Before the Test: . Be sure to Drink plenty of water. . Do not consume any caffeinated/decaffeinated beverages or chocolate 12 hours prior to your test. . Do not take any antihistamines 12 hours prior to your test.  On the Day of the Test: . Drink plenty of water. Do not drink any water within one hour of the test. . Do not eat any food 4 hours prior to the test. . You may take your regular medications prior to the test.  . Take metoprolol (Lopressor) 100 mg two hours prior to test. . FEMALES- please wear underwire-free bra if available                              -IF HR is greater than 55 BPM and patient is less than or equal to 36 yrs old Lopressor 100mg  x1.                   After the Test: . Drink plenty of water. . After receiving IV contrast, you may experience a mild flushed feeling. This is normal. . On occasion, you may experience a mild rash up to 24 hours after the test.  This is not dangerous. If this occurs, you can take Benadryl 25 mg and increase your fluid intake. . If you experience trouble breathing, this can be serious. If it is severe call 911 IMMEDIATELY. If it is mild, please call our office. . If you take any of these medications: Glipizide/Metformin, Avandament, Glucavance, please do not take 48 hours after completing test.    Please contact the cardiac imaging nurse navigator should you have any questions/concerns Marchia Bond, RN Navigator Cardiac [place] [facility] [phone] Office  [phone] Cell    Testing/Procedures: Your physician has requested that you have an echocardiogram. Echocardiography is a painless test that uses sound waves to create images of your heart. It provides your doctor with information about the size and shape of your heart and how well your heart's chambers and valves are working. This procedure takes approximately one hour. There are no restrictions for this procedure. LOCATION: [facility]: [place], [place], [place] [address]    Follow-Up: At [facility], you and your health needs are our priority.  As part of our continuing mission to provide you with exceptional heart care, we have created designated Provider Care Teams.  These Care Teams include your primary Cardiologist (physician) and Advanced Practice Providers (APPs -  Physician Assistants and Nurse Practitioners) who all work together to provide you with the care you need, when you need it. . You MAY SCHEDULE a follow up appointment AS NEEDED UNLESS YOUR RESULTS ARE ABNORMAL.  Marland Kitchen You may see Dr. Gwenlyn Found or one of the following Advanced Practice Providers on your designated Care Team:   . Kerin Ransom, PA-C . Daleen Snook Kroeger, PA-C . Sande Rives, PA-C . Almyra Deforest, PA-C . Fabian Sharp, PA-C . Jory Sims, DNP . Rhonda Barrett, PA-C .   DR. BERRY WILL REVIEW THE RESULTS OF YOUR  ECHOCARDIOGRAM AND CORONARY CTA AND DETERMINE YOUR CARDIAC RISK CLEARANCE FOR YOUR UPCOMING PROCEDURE.

## 2018-10-10 NOTE — Assessment & Plan Note (Signed)
Tracy [name] was referred to me by Dr. Birdie Riddle for dyspnea on exertion.  She is scheduled to have a reverse shoulder replacement.  She has never smoked.  She complains of increasing dyspnea out of proportion to the activity which she is performed.  Her exam is benign.  EKG shows no acute changes.  She really has no cardiac risk factors.  I am going to get a 2D echocardiogram and coronary CTA to further evaluate.

## 2018-10-10 NOTE — Progress Notes (Signed)
10/10/2018 Tracy [name]   12-Aug-1950  [id number]  Primary Physician [name], MD Primary Cardiologist: Lorretta Harp MD Lupe Carney, [place]  HPI:  Tracy [name] is a 68 y.o. moderately overweight married Caucasian female mother of 2, grandmother of 5 grandchildren who was a Building services engineer at [employer].  She was referred by Dr. Birdie Riddle for cardiovascular valuation and preoperative clearance before shoulder replacement.  She basically has no cardiac risk factors other than mild hyperlipidemia.  She is never had a heart attack or stroke.  She denies chest pain but does complain of dyspnea out of proportion to the amount of activity being performed.  She walks 2-1/2 miles 3 times a week.  She denies chest pain.   Current Meds  Medication Sig  . Calcium Carbonate-Vitamin D (CALCIUM 600+D PO) Take 1 tablet by mouth 2 (two) times daily.  . Cholecalciferol (VITAMIN D) 2000 units tablet Take 2,000 Units by mouth daily.  . clotrimazole (LOTRIMIN) 1 % cream Apply 1 application topically 2 (two) times daily.  Marland Kitchen doxycycline (VIBRAMYCIN) 100 MG capsule Take 100 mg by mouth 2 (two) times daily as needed (rosacea).  . fenofibrate 160 MG tablet TAKE 1 TABLET BY MOUTH  DAILY  . fluticasone (FLONASE) 50 MCG/ACT nasal spray USE 1 SPRAY INTO BOTH  NOSTRILS DAILY.  . Melatonin 5 MG TABS Take 5 mg by mouth at bedtime.  . metroNIDAZOLE (METROGEL) 0.75 % gel Apply topically.  . Multiple Vitamin (MULTIVITAMIN WITH MINERALS) TABS tablet Take 1 tablet by mouth daily.  . pantoprazole (PROTONIX) 40 MG tablet TAKE 1 TABLET BY MOUTH  TWICE DAILY  . polyethylene glycol (MIRALAX / GLYCOLAX) packet Take 17 g by mouth daily.  . Polyethylene Glycol 400 (BLINK TEARS OP) Place 1 drop into both eyes daily as needed (dry eyes).     Allergies  Allergen Reactions  . Demerol [Meperidine] Nausea And Vomiting  . Latex Rash and Other (See Comments)    Reaction:  Blisters   . Other  Itching    Nylon sheets, pt prefers cotton sheets   . Tramadol Nausea And Vomiting    Social History   Socioeconomic History  . Marital status: Married    Spouse name: Not on file  . Number of children: Not on file  . Years of education: Not on file  . Highest education level: Not on file  Occupational History  . Not on file  Social Needs  . Financial resource strain: Not on file  . Food insecurity    Worry: Not on file    Inability: Not on file  . Transportation needs    Medical: Not on file    Non-medical: Not on file  Tobacco Use  . Smoking status: Never Smoker  . Smokeless tobacco: Never Used  Substance and Sexual Activity  . Alcohol use: No  . Drug use: No  . Sexual activity: Not Currently  Lifestyle  . Physical activity    Days per week: Not on file    Minutes per session: Not on file  . Stress: Not on file  Relationships  . Social Herbalist on phone: Not on file    Gets together: Not on file    Attends religious service: Not on file    Active member of club or organization: Not on file    Attends meetings of clubs or organizations: Not on file    Relationship status: Not on file  .  Intimate partner violence    Fear of current or ex partner: Not on file    Emotionally abused: Not on file    Physically abused: Not on file    Forced sexual activity: Not on file  Other Topics Concern  . Not on file  Social History Narrative  . Not on file     Review of Systems: General: negative for chills, fever, night sweats or weight changes.  Cardiovascular: negative for chest pain, dyspnea on exertion, edema, orthopnea, palpitations, paroxysmal nocturnal dyspnea or shortness of breath Dermatological: negative for rash Respiratory: negative for cough or wheezing Urologic: negative for hematuria Abdominal: negative for nausea, vomiting, diarrhea, bright red blood per rectum, melena, or hematemesis Neurologic: negative for visual changes, syncope, or  dizziness All other systems reviewed and are otherwise negative except as noted above.    Blood pressure (!) 156/83, pulse 85, height 5\' 4"  (1.626 m), weight 160 lb (72.6 kg), SpO2 98 %.  General appearance: alert and no distress Neck: no adenopathy, no carotid bruit, no JVD, supple, symmetrical, trachea midline and thyroid not enlarged, symmetric, no tenderness/mass/nodules Lungs: clear to auscultation bilaterally Heart: regular rate and rhythm, S1, S2 normal, no murmur, click, rub or gallop Extremities: extremities normal, atraumatic, no cyanosis or edema Pulses: 2+ and symmetric Skin: Skin color, texture, turgor normal. No rashes or lesions Neurologic: Alert and oriented X 3, normal strength and tone. Normal symmetric reflexes. Normal coordination and gait  EKG not performed today  ASSESSMENT AND PLAN:   Dyspnea on exertion Ms. Crookshanks was referred to me by Dr. Birdie Riddle for dyspnea on exertion.  She is scheduled to have a reverse shoulder replacement.  She has never smoked.  She complains of increasing dyspnea out of proportion to the activity which she is performed.  Her exam is benign.  EKG shows no acute changes.  She really has no cardiac risk factors.  I am going to get a 2D echocardiogram and coronary CTA to further evaluate.      Lorretta Harp MD FACP,FACC,FAHA, [facility]  10/10/2018 4:46 PM

## 2018-10-16 ENCOUNTER — Other Ambulatory Visit (HOSPITAL_COMMUNITY): Payer: Medicare Other

## 2018-10-17 ENCOUNTER — Other Ambulatory Visit: Payer: Self-pay

## 2018-10-17 ENCOUNTER — Ambulatory Visit (HOSPITAL_COMMUNITY): Payer: Medicare Other | Attending: Cardiovascular Disease

## 2018-10-17 ENCOUNTER — Other Ambulatory Visit: Payer: Medicare Other | Admitting: *Deleted

## 2018-10-17 DIAGNOSIS — Z01812 Encounter for preprocedural laboratory examination: Secondary | ICD-10-CM | POA: Diagnosis not present

## 2018-10-17 DIAGNOSIS — R0609 Other forms of dyspnea: Secondary | ICD-10-CM | POA: Diagnosis not present

## 2018-10-17 DIAGNOSIS — Z01818 Encounter for other preprocedural examination: Secondary | ICD-10-CM | POA: Insufficient documentation

## 2018-10-17 LAB — BASIC METABOLIC PANEL
BUN/Creatinine Ratio: 25 (ref 12–28)
BUN: 28 mg/dL — ABNORMAL HIGH (ref 8–27)
CO2: 24 mmol/L (ref 20–29)
Calcium: 10.1 mg/dL (ref 8.7–10.3)
Chloride: 107 mmol/L — ABNORMAL HIGH (ref 96–106)
Creatinine, Ser: 1.13 mg/dL — ABNORMAL HIGH (ref 0.57–1.00)
GFR calc Af Amer: 57 mL/min/{1.73_m2} — ABNORMAL LOW (ref >59)
GFR calc non Af Amer: 49 mL/min/{1.73_m2} — ABNORMAL LOW (ref >59)
Glucose: 89 mg/dL (ref 65–99)
Potassium: 4 mmol/L (ref 3.5–5.2)
Sodium: 145 mmol/L — ABNORMAL HIGH (ref 134–144)

## 2018-10-17 LAB — CBC
Hematocrit: 39.7 % (ref 34.0–46.6)
Hemoglobin: 13.4 g/dL (ref 11.1–15.9)
MCH: 30.3 pg (ref 26.6–33.0)
MCHC: 33.8 g/dL (ref 31.5–35.7)
MCV: 90 fL (ref 79–97)
Platelets: 232 10*3/uL (ref 150–450)
RBC: 4.42 x10E6/uL (ref 3.77–5.28)
RDW: 11.6 % — ABNORMAL LOW (ref 11.7–15.4)
WBC: 5.6 10*3/uL (ref 3.4–10.8)

## 2018-10-23 ENCOUNTER — Telehealth (HOSPITAL_COMMUNITY): Payer: Self-pay | Admitting: Emergency Medicine

## 2018-10-23 DIAGNOSIS — H25013 Cortical age-related cataract, bilateral: Secondary | ICD-10-CM | POA: Diagnosis not present

## 2018-10-23 DIAGNOSIS — H35363 Drusen (degenerative) of macula, bilateral: Secondary | ICD-10-CM | POA: Diagnosis not present

## 2018-10-23 DIAGNOSIS — H2513 Age-related nuclear cataract, bilateral: Secondary | ICD-10-CM | POA: Diagnosis not present

## 2018-10-23 DIAGNOSIS — H04123 Dry eye syndrome of bilateral lacrimal glands: Secondary | ICD-10-CM | POA: Diagnosis not present

## 2018-10-23 NOTE — Telephone Encounter (Signed)
Reaching out to patient to offer assistance regarding upcoming cardiac imaging study; pt verbalizes understanding of appt date/time, parking situation and where to check in, pre-test NPO status and medications ordered, and verified current allergies; name and call back number provided for further questions should they arise Hobson Lax RN Navigator Cardiac Imaging [facility] [phone] office [phone] cell 

## 2018-10-24 ENCOUNTER — Encounter (HOSPITAL_COMMUNITY): Payer: Self-pay

## 2018-10-24 ENCOUNTER — Ambulatory Visit (HOSPITAL_COMMUNITY)
Admission: RE | Admit: 2018-10-24 | Discharge: 2018-10-24 | Disposition: A | Discharge status: Home / Self Care | Payer: Medicare Other | Source: Ambulatory Visit | Attending: Cardiovascular Disease | Admitting: Cardiovascular Disease

## 2018-10-24 ENCOUNTER — Other Ambulatory Visit: Payer: Self-pay

## 2018-10-24 ENCOUNTER — Ambulatory Visit (HOSPITAL_COMMUNITY): Payer: Medicare Other

## 2018-10-24 DIAGNOSIS — R0609 Other forms of dyspnea: Secondary | ICD-10-CM | POA: Diagnosis not present

## 2018-10-24 DIAGNOSIS — Z01818 Encounter for other preprocedural examination: Secondary | ICD-10-CM

## 2018-10-24 DIAGNOSIS — Z01810 Encounter for preprocedural cardiovascular examination: Secondary | ICD-10-CM | POA: Diagnosis not present

## 2018-10-24 DIAGNOSIS — K449 Diaphragmatic hernia without obstruction or gangrene: Secondary | ICD-10-CM | POA: Insufficient documentation

## 2018-10-24 DIAGNOSIS — E785 Hyperlipidemia, unspecified: Secondary | ICD-10-CM | POA: Diagnosis not present

## 2018-10-24 MED ORDER — NITROGLYCERIN 0.4 MG SL SUBL
SUBLINGUAL_TABLET | SUBLINGUAL | Status: AC
  Filled 2018-10-24: qty 2

## 2018-10-24 MED ORDER — IOHEXOL 350 MG/ML SOLN
80.0000 mL | Freq: Once | INTRAVENOUS | Status: AC | PRN
  Administered 2018-10-24: 80 mL via INTRAVENOUS

## 2018-10-24 MED ORDER — NITROGLYCERIN 0.4 MG SL SUBL: 0.8000 mg | SUBLINGUAL_TABLET | SUBLINGUAL | Status: DC | PRN

## 2018-10-24 MED ORDER — NITROGLYCERIN 0.4 MG SL SUBL
0.8000 mg | SUBLINGUAL_TABLET | Freq: Once | SUBLINGUAL | Status: AC
  Administered 2018-10-24: 0.8 mg via SUBLINGUAL
  Filled 2018-10-24: qty 25

## 2018-10-26 ENCOUNTER — Telehealth: Payer: Self-pay

## 2018-10-26 NOTE — Telephone Encounter (Signed)
Needs coronary CTA and 2D echo performed prior to her shoulder surgery for clearance

## 2018-10-26 NOTE — Telephone Encounter (Signed)
8/28 Results of pt recent echo and coronary CTA routed to Dr. Annye Asa via [facility] fax function

## 2018-10-26 NOTE — Telephone Encounter (Signed)
Yes. They have resulted as of 8/26 and 8/19 respectively. Please review

## 2018-10-27 NOTE — Telephone Encounter (Signed)
Cleared for surg. 2D and CTA nl

## 2018-10-29 NOTE — Telephone Encounter (Signed)
   Primary Cardiologist: Quay Burow, MD  Chart reviewed as part of pre-operative protocol coverage. Dr. Gwenlyn Found has reviewed coronary CTA and 2D echo, both normal. Given past medical history and time since last visit, based on ACC/AHA guidelines, Tracy [name] would be at acceptable risk for the planned procedure without further cardiovascular testing.   I will route this recommendation to the requesting party via Epic fax function and remove from pre-op pool.  Please call with questions.  Lyda Jester, PA-C 10/29/2018, 10:56 AM

## 2018-10-30 ENCOUNTER — Encounter: Payer: Self-pay | Admitting: Family Medicine

## 2018-11-02 DIAGNOSIS — H2513 Age-related nuclear cataract, bilateral: Secondary | ICD-10-CM | POA: Diagnosis not present

## 2018-11-02 DIAGNOSIS — H35363 Drusen (degenerative) of macula, bilateral: Secondary | ICD-10-CM | POA: Diagnosis not present

## 2018-11-02 DIAGNOSIS — H2512 Age-related nuclear cataract, left eye: Secondary | ICD-10-CM | POA: Diagnosis not present

## 2018-11-02 DIAGNOSIS — H25013 Cortical age-related cataract, bilateral: Secondary | ICD-10-CM | POA: Diagnosis not present

## 2018-11-21 DIAGNOSIS — H25812 Combined forms of age-related cataract, left eye: Secondary | ICD-10-CM | POA: Diagnosis not present

## 2018-11-21 DIAGNOSIS — H2512 Age-related nuclear cataract, left eye: Secondary | ICD-10-CM | POA: Diagnosis not present

## 2018-11-21 DIAGNOSIS — H52222 Regular astigmatism, left eye: Secondary | ICD-10-CM | POA: Diagnosis not present

## 2018-11-22 ENCOUNTER — Other Ambulatory Visit: Payer: Self-pay | Admitting: Family Medicine

## 2018-11-25 NOTE — Assessment & Plan Note (Signed)
Deteriorated.  Restart Cymbalta as this was helping w/ both mood and pain levels.  Pt expressed understanding and is in agreement w/ plan.

## 2018-11-25 NOTE — Assessment & Plan Note (Signed)
Has been following w/ Endo but feels that if all they are going to do is 'wait and see' that she can do that here.  Will get labs to assess.

## 2018-11-25 NOTE — Assessment & Plan Note (Signed)
Tolerating fenofibrate w/o difficulty.  Check labs.  Adjust meds prn

## 2018-11-25 NOTE — Assessment & Plan Note (Signed)
Ongoing issue for pt.  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  Pt expressed understanding and is in agreement w/ plan.

## 2018-11-25 NOTE — Assessment & Plan Note (Signed)
Applauded her weight loss but discussed that she needed to continue eating regularly.  Will follow.

## 2018-11-27 DIAGNOSIS — H25011 Cortical age-related cataract, right eye: Secondary | ICD-10-CM | POA: Diagnosis not present

## 2018-11-27 DIAGNOSIS — H2511 Age-related nuclear cataract, right eye: Secondary | ICD-10-CM | POA: Diagnosis not present

## 2018-11-29 HISTORY — PX: CATARACT EXTRACTION, BILATERAL: SHX1313

## 2018-12-05 DIAGNOSIS — H25811 Combined forms of age-related cataract, right eye: Secondary | ICD-10-CM | POA: Diagnosis not present

## 2018-12-05 DIAGNOSIS — H25011 Cortical age-related cataract, right eye: Secondary | ICD-10-CM | POA: Diagnosis not present

## 2018-12-05 DIAGNOSIS — H52221 Regular astigmatism, right eye: Secondary | ICD-10-CM | POA: Diagnosis not present

## 2018-12-05 DIAGNOSIS — H2511 Age-related nuclear cataract, right eye: Secondary | ICD-10-CM | POA: Diagnosis not present

## 2019-01-04 ENCOUNTER — Encounter: Payer: Self-pay | Admitting: Family Medicine

## 2019-02-20 ENCOUNTER — Encounter: Payer: Self-pay | Admitting: Family Medicine

## 2019-02-21 ENCOUNTER — Telehealth: Payer: Self-pay | Admitting: Gastroenterology

## 2019-02-21 DIAGNOSIS — R1032 Left lower quadrant pain: Secondary | ICD-10-CM

## 2019-02-21 MED ORDER — AMOXICILLIN-POT CLAVULANATE 875-125 MG PO TABS: 1.0000 | ORAL_TABLET | Freq: Two times a day (BID) | ORAL | 0 refills | Status: DC

## 2019-02-21 NOTE — Telephone Encounter (Signed)
It could be diverticulitis, as records indicate she had similar symptoms with a severe episode of diverticulitis in June 2018.  Agree with the MiraLAX plan as you outlined.  Also, I sent a prescription for Augmentin to her pharmacy. 1 tablet twice daily for 7 days.  I see doxycycline on her list of medicines, and it seems like something she might take intermittently.  Do not take the doxycycline while on the Augmentin.  Clear liquid diet today and tomorrow, then slowly resume regular diet as tolerated.  Call on-call physician over holiday weekend as needed.

## 2019-02-21 NOTE — Telephone Encounter (Signed)
Called and spoke with patient-patient informed of MD recommendations; patient is agreeable with plan of care;  Patient verbalized understanding of information/instructions;  Patient was advised to call the office at [phone] if questions/concerns arise; 

## 2019-02-21 NOTE — Telephone Encounter (Signed)
DOD-Dr. Silverio Decamp pt-Called and spoke with patient -patient reports symptoms started about 3 days ago-"started to feel different and uncomfortable"; taking Miralax QAM-feeling horribly bloated and horribly extended-LLQ abd pain/tenderness-requesting PCN for tx-has had chills off/on, no fever, no nausea/vomiting, stools are "rock hard balls-look like deer droppings", reports straining to have BM-drinking tons of water-  Advised patient to increase Miralax to 2-3x per day, increase fluids, can take stool softener- Please advise of further instructions for patient

## 2019-02-25 ENCOUNTER — Other Ambulatory Visit (INDEPENDENT_AMBULATORY_CARE_PROVIDER_SITE_OTHER): Payer: Medicare Other

## 2019-02-25 DIAGNOSIS — R1032 Left lower quadrant pain: Secondary | ICD-10-CM

## 2019-02-25 LAB — BASIC METABOLIC PANEL
BUN: 18 mg/dL (ref 6–23)
CO2: 28 mEq/L (ref 19–32)
Calcium: 10.5 mg/dL (ref 8.4–10.5)
Chloride: 103 mEq/L (ref 96–112)
Creatinine, Ser: 0.95 mg/dL (ref 0.40–1.20)
GFR: 58.01 mL/min — ABNORMAL LOW (ref >60.00)
Glucose, Bld: 132 mg/dL — ABNORMAL HIGH (ref 70–99)
Potassium: 3.7 mEq/L (ref 3.5–5.1)
Sodium: 140 mEq/L (ref 135–145)

## 2019-02-25 LAB — CBC
HCT: 41.9 % (ref 36.0–46.0)
Hemoglobin: 14.1 g/dL (ref 12.0–15.0)
MCHC: 33.6 g/dL (ref 30.0–36.0)
MCV: 90.9 fl (ref 78.0–100.0)
Platelets: 232 10*3/uL (ref 150.0–400.0)
RBC: 4.61 Mil/uL (ref 3.87–5.11)
RDW: 12.7 % (ref 11.5–15.5)
WBC: 7.4 10*3/uL (ref 4.0–10.5)

## 2019-02-25 NOTE — Telephone Encounter (Signed)
Called patient. She started having LLQ discomfort 12/21, became constipated and called on 12/24 and was started on Augmentin. Took it only for 2 days and stopped because of severe diarrhea on 12/26 and started taking probiotics.  Diarrhea has improved since she stopped antibiotics. Continues to have left lower quadrant abdominal discomfort.   We will plan for CT abdomen pelvis with contrast to exclude persistent diverticulitis or complication.  Also check CBC and BMP.  We will hold off restarting antibiotics given she had severe diarrhea unless has findings of acute diverticulitis on CT. Soft diet for next 10 to 14 days Schedule follow-up office visit.  Thank you

## 2019-02-25 NOTE — Telephone Encounter (Signed)
Please see previous notes.  She stopped the augmentin per Dr. Steve Rattler instructions on 02/23/19 due to diarrhea. She states he istructed her to stop the augmentin and begin a probiotic and stay on the full liquid diet.  She reports that she no longer has diarrhea.  She has continued LLQ tenderness, but no pain. She is asking if she should resume the augmentin?  Only took 5 doses of the augmentin.

## 2019-02-25 NOTE — Telephone Encounter (Signed)
Patient notified of the results and recommendations She has been scheduled for CT scan tomorrow at [facility] 3:00 She will come for her labs and pick up her contrast and instructions today.

## 2019-02-25 NOTE — Telephone Encounter (Signed)
Pt calling back states she is not feeling better.

## 2019-02-26 ENCOUNTER — Other Ambulatory Visit: Payer: Self-pay

## 2019-02-26 ENCOUNTER — Ambulatory Visit (INDEPENDENT_AMBULATORY_CARE_PROVIDER_SITE_OTHER)
Admission: RE | Admit: 2019-02-26 | Discharge: 2019-02-26 | Disposition: A | Discharge status: Home / Self Care | Payer: Medicare Other | Source: Ambulatory Visit | Attending: Gastroenterology | Admitting: Gastroenterology

## 2019-02-26 DIAGNOSIS — R1032 Left lower quadrant pain: Secondary | ICD-10-CM | POA: Diagnosis not present

## 2019-02-26 DIAGNOSIS — K5730 Diverticulosis of large intestine without perforation or abscess without bleeding: Secondary | ICD-10-CM | POA: Diagnosis not present

## 2019-02-26 MED ORDER — IOHEXOL 300 MG/ML  SOLN
100.0000 mL | Freq: Once | INTRAMUSCULAR | Status: AC | PRN
  Administered 2019-02-26: 15:00:00 100 mL via INTRAVENOUS

## 2019-02-27 ENCOUNTER — Other Ambulatory Visit: Payer: Self-pay

## 2019-02-27 MED ORDER — CIPROFLOXACIN HCL 500 MG PO TABS: 500.0000 mg | ORAL_TABLET | Freq: Two times a day (BID) | ORAL | 0 refills | Status: AC

## 2019-02-27 MED ORDER — METRONIDAZOLE 500 MG PO TABS: 500.0000 mg | ORAL_TABLET | Freq: Three times a day (TID) | ORAL | 0 refills | Status: AC

## 2019-02-28 ENCOUNTER — Telehealth: Payer: Self-pay

## 2019-02-28 ENCOUNTER — Telehealth: Payer: Self-pay | Admitting: Gastroenterology

## 2019-02-28 NOTE — Telephone Encounter (Signed)
Patient is requesting her 6 month Prolia injection during her CPE labs. Informed patient that I would route message to Jess to order injection.

## 2019-02-28 NOTE — Telephone Encounter (Signed)
Patient wanted to discuss concerns of any possible reactions between her medications. Reviewed her medication list. List is accurate. Reassured. Encouraged to also ask her pharmacist for guidance on her medications and interactions. She is holding her multi-vitamin and calcium supplement for now. She is not taking the probiotic while on the Cipro and Flagyl. Reports she is feeling better today. She has had a bowel movement in response to the Miralax.

## 2019-03-04 NOTE — Telephone Encounter (Signed)
Pt CPE is VV. She is not due for her injection until 03/08/19. Prolia says up to date. Will order today.

## 2019-03-06 ENCOUNTER — Ambulatory Visit (INDEPENDENT_AMBULATORY_CARE_PROVIDER_SITE_OTHER): Payer: Medicare Other | Admitting: Family Medicine

## 2019-03-06 ENCOUNTER — Encounter: Payer: Self-pay | Admitting: Family Medicine

## 2019-03-06 ENCOUNTER — Other Ambulatory Visit: Payer: Self-pay

## 2019-03-06 DIAGNOSIS — Z0000 Encounter for general adult medical examination without abnormal findings: Secondary | ICD-10-CM | POA: Diagnosis not present

## 2019-03-06 DIAGNOSIS — E781 Pure hyperglyceridemia: Secondary | ICD-10-CM

## 2019-03-06 DIAGNOSIS — M810 Age-related osteoporosis without current pathological fracture: Secondary | ICD-10-CM

## 2019-03-06 NOTE — Progress Notes (Signed)
I have discussed the procedure for the virtual visit with the patient who has given consent to proceed with assessment and treatment.   Pt unable to obtain vitals.   Morgaine Kimball L Drew Lips, CMA     

## 2019-03-06 NOTE — Progress Notes (Signed)
Virtual Visit via Video   I connected with patient on 03/06/19 at  9:00 AM EST by a video enabled telemedicine application and verified that I am speaking with the correct person using two identifiers.  Location patient: Home Location provider: Acupuncturist, Office Persons participating in the virtual visit: Patient, Provider, Parc (Jess B)  I discussed the limitations of evaluation and management by telemedicine and the availability of in person appointments. The patient expressed understanding and agreed to proceed.  Subjective:   HPI:   Here today for CPE and MWV.  Risk Factors: Hypertriglyceridemia- chronic problem, on fenofibrate Osteoporosis- chronic problem, will get Prolia on Monday Physical Activity: walking 3-4x/week Fall Risk: moderate Depression: pt reports current anxiety and depression but stopped SSRI.  Not interested in medication. Hearing: normal to conversational tones, mildly decreased to whispered voice ADL's: independent Cognitive: normal linear thought process, memory and attention intact Home Safety: safe at home, lives w/ husband Height, Weight, BMI, Visual Acuity: see vitals, vision corrected to 20/20 w/ glasses Counseling: UTD on mammo, colonoscopy, Tdap, Pneumonia, and flu Labs Ordered: See A&P Care Plan: See A&P   Patient Care Team    Relationship Specialty Notifications Start End  [name], MD PCP - General   02/10/10   Lorretta Harp, MD PCP - Cardiology Cardiology  10/29/18   [name], MD Consulting Physician Internal Medicine  03/13/15   Festus Aloe, MD Consulting Physician Urology  03/13/15   Gaynelle Arabian, MD Consulting Physician Orthopedic Surgery  05/19/16   Harriett Sine, MD Consulting Physician Dermatology  05/19/16   Mauri Pole, MD Consulting Physician Gastroenterology  09/07/16   Hortencia Pilar, MD Consulting Physician Surgery  11/28/17    Comment: Opthalmology   Netta Cedars, MD  Consulting Physician Orthopedic Surgery  11/28/17   Wylene Simmer, MD Consulting Physician Orthopedic Surgery  11/28/17    Comment: foot     Health Maintenance  Topic Date Due  . MAMMOGRAM  08/18/2019  . TETANUS/TDAP  06/29/2022  . COLONOSCOPY  01/06/2027  . INFLUENZA VACCINE  Completed  . DEXA SCAN  Completed  . Hepatitis C Screening  Completed  . PNA vac Low Risk Adult  Completed     ROS:   Patient reports no vision/ hearing changes, adenopathy,fever, weight change,  persistant/recurrent hoarseness , swallowing issues, chest pain, palpitations, edema, persistant/recurrent cough, hemoptysis, dyspnea (rest/exertional/paroxysmal nocturnal), gastrointestinal bleeding (melena, rectal bleeding), significant heartburn, bowel changes, GU symptoms (dysuria, hematuria, incontinence), Gyn symptoms (abnormal  bleeding, pain),  syncope, focal weakness, memory loss, numbness & tingling, skin/hair/nail changes, abnormal bruising or bleeding, anxiety, or depression.   + abd pain w/ recent bout of diverticulitis  Patient Active Problem List   Diagnosis Date Noted  . Dyspnea on exertion 10/10/2018  . Overweight (BMI 25.0-29.9) 11/28/2017  . 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 Shoulder arthritis 03/01/2017  . Perforation of sigmoid colon due to diverticulitis 07/05/2016  . OA (osteoarthritis) of knee 08/17/2015  . 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 Subclinical hyperthyroidism 06/05/2014  . Trochanteric bursitis of left hip 04/01/2014  . Rosacea 03/26/2014  . Allergic rhinitis 02/19/2014  . Left hip pain 12/19/2013  . Atrophic vaginitis 10/07/2013  . Screening for malignant neoplasm of the cervix 01/12/2011  . Physical exam 01/12/2011  . HYPERTRIGLYCERIDEMIA 12/03/2009  . GERD 12/03/2009  . UTI'S, RECURRENT 12/03/2009  . LEG CRAMPS, NOCTURNAL 12/03/2009  . DIVERTICULITIS, HX OF 12/03/2009  . Depression with anxiety 11/05/2009  . Osteoporosis 11/05/2009  Social History    Tobacco Use  . Smoking status: Never Smoker  . Smokeless tobacco: Never Used  Substance Use Topics  . Alcohol use: No    Current Outpatient Medications:  .  Calcium Carbonate-Vitamin D (CALCIUM 600+D PO), Take 1 tablet by mouth 2 (two) times daily., Disp: , Rfl:  .  Cholecalciferol (VITAMIN D) 2000 units tablet, Take 2,000 Units by mouth daily., Disp: , Rfl:  .  clotrimazole (LOTRIMIN) 1 % cream, Apply 1 application topically 2 (two) times daily., Disp: 30 g, Rfl: 0 .  fenofibrate 160 MG tablet, TAKE 1 TABLET BY MOUTH  DAILY, Disp: 90 tablet, Rfl: 1 .  fluticasone (FLONASE) 50 MCG/ACT nasal spray, USE 1 SPRAY INTO BOTH  NOSTRILS DAILY., Disp: 32 g, Rfl: 6 .  Melatonin 5 MG TABS, Take 5 mg by mouth at bedtime., Disp: , Rfl:  .  metroNIDAZOLE (METROGEL) 0.75 % gel, Apply topically., Disp: , Rfl:  .  Multiple Vitamin (MULTIVITAMIN WITH MINERALS) TABS tablet, Take 1 tablet by mouth daily., Disp: , Rfl:  .  pantoprazole (PROTONIX) 40 MG tablet, TAKE 1 TABLET BY MOUTH  TWICE DAILY, Disp: 180 tablet, Rfl: 3 .  polyethylene glycol (MIRALAX / GLYCOLAX) packet, Take 17 g by mouth daily., Disp: 14 each, Rfl: 0 .  Polyethylene Glycol 400 (BLINK TEARS OP), Place 1 drop into both eyes daily as needed (dry eyes)., Disp: , Rfl:  .  doxycycline (VIBRAMYCIN) 100 MG capsule, Take 100 mg by mouth 2 (two) times daily as needed (rosacea)., Disp: , Rfl:  .  metoprolol tartrate (LOPRESSOR) 100 MG tablet, Take 1 tablet (100 mg total) by mouth once for 1 dose. TAKE 1 TABLET TWO HOURS PRIOR TO YOUR CORONARY CTA, Disp: 1 tablet, Rfl: 0  Allergies  Allergen Reactions  . Demerol [Meperidine] Nausea And Vomiting  . Latex Rash and Other (See Comments)    Reaction:  Blisters   . Other Itching    Nylon sheets, pt prefers cotton sheets   . Tramadol Nausea And Vomiting    Objective:   There were no vitals taken for this visit.  AAOx3, NAD NCAT, EOMI No obvious CN deficits Coloring WNL Pt is able to speak  clearly, coherently without shortness of breath or increased work of breathing.  Thought process is linear.  Mood is appropriate.   Assessment and Plan:   CPE- pt's limited video PE WNL.  UTD on mammo, cologuard, immunizations.  Will get Prolia injxn on Monday when she returns for labs.  Encouraged healthy diet and regular exercise and reviewed health maintenance.  Hypertriglyceridemia- on fenofibrate.  Discussed need for healthy diet and regular exercise.  Check labs.  Adjust meds prn   Osteoporosis- check Vit D level.  Prolia shot on Monday   Annye Asa, MD 03/06/2019

## 2019-03-11 ENCOUNTER — Encounter: Payer: Self-pay | Admitting: Family Medicine

## 2019-03-11 ENCOUNTER — Ambulatory Visit: Payer: Medicare Other

## 2019-03-11 MED ORDER — PANTOPRAZOLE SODIUM 40 MG PO TBEC: 40.0000 mg | DELAYED_RELEASE_TABLET | Freq: Two times a day (BID) | ORAL | 3 refills | Status: DC

## 2019-03-11 MED ORDER — FENOFIBRATE 160 MG PO TABS: 160.0000 mg | ORAL_TABLET | Freq: Every day | ORAL | 3 refills | Status: DC

## 2019-03-12 ENCOUNTER — Other Ambulatory Visit: Payer: Self-pay

## 2019-03-12 ENCOUNTER — Ambulatory Visit (INDEPENDENT_AMBULATORY_CARE_PROVIDER_SITE_OTHER): Payer: Medicare Other | Admitting: Emergency Medicine

## 2019-03-12 DIAGNOSIS — E781 Pure hyperglyceridemia: Secondary | ICD-10-CM | POA: Diagnosis not present

## 2019-03-12 DIAGNOSIS — M810 Age-related osteoporosis without current pathological fracture: Secondary | ICD-10-CM

## 2019-03-12 LAB — BASIC METABOLIC PANEL
BUN: 23 mg/dL (ref 6–23)
CO2: 33 mEq/L — ABNORMAL HIGH (ref 19–32)
Calcium: 10.5 mg/dL (ref 8.4–10.5)
Chloride: 105 mEq/L (ref 96–112)
Creatinine, Ser: 0.95 mg/dL (ref 0.40–1.20)
GFR: 58 mL/min — ABNORMAL LOW (ref >60.00)
Glucose, Bld: 88 mg/dL (ref 70–99)
Potassium: 4.4 mEq/L (ref 3.5–5.1)
Sodium: 144 mEq/L (ref 135–145)

## 2019-03-12 LAB — CBC WITH DIFFERENTIAL/PLATELET
Basophils Absolute: 0.1 10*3/uL (ref 0.0–0.1)
Basophils Relative: 1.3 % (ref 0.0–3.0)
Eosinophils Absolute: 0.3 10*3/uL (ref 0.0–0.7)
Eosinophils Relative: 5.9 % — ABNORMAL HIGH (ref 0.0–5.0)
HCT: 39.1 % (ref 36.0–46.0)
Hemoglobin: 13 g/dL (ref 12.0–15.0)
Lymphocytes Relative: 32.7 % (ref 12.0–46.0)
Lymphs Abs: 1.7 10*3/uL (ref 0.7–4.0)
MCHC: 33.3 g/dL (ref 30.0–36.0)
MCV: 91.4 fl (ref 78.0–100.0)
Monocytes Absolute: 0.5 10*3/uL (ref 0.1–1.0)
Monocytes Relative: 8.7 % (ref 3.0–12.0)
Neutro Abs: 2.7 10*3/uL (ref 1.4–7.7)
Neutrophils Relative %: 51.4 % (ref 43.0–77.0)
Platelets: 223 10*3/uL (ref 150.0–400.0)
RBC: 4.28 Mil/uL (ref 3.87–5.11)
RDW: 12.9 % (ref 11.5–15.5)
WBC: 5.2 10*3/uL (ref 4.0–10.5)

## 2019-03-12 LAB — LIPID PANEL
Cholesterol: 190 mg/dL (ref 0–200)
HDL: 61.6 mg/dL (ref >39.00)
LDL Cholesterol: 102 mg/dL — ABNORMAL HIGH (ref 0–99)
NonHDL: 128.62
Total CHOL/HDL Ratio: 3
Triglycerides: 131 mg/dL (ref 0.0–149.0)
VLDL: 26.2 mg/dL (ref 0.0–40.0)

## 2019-03-12 LAB — HEPATIC FUNCTION PANEL
ALT: 13 U/L (ref 0–35)
AST: 24 U/L (ref 0–37)
Albumin: 4.1 g/dL (ref 3.5–5.2)
Alkaline Phosphatase: 36 U/L — ABNORMAL LOW (ref 39–117)
Bilirubin, Direct: 0.1 mg/dL (ref 0.0–0.3)
Total Bilirubin: 0.5 mg/dL (ref 0.2–1.2)
Total Protein: 6.6 g/dL (ref 6.0–8.3)

## 2019-03-12 LAB — TSH: TSH: 0.04 u[IU]/mL — ABNORMAL LOW (ref 0.35–4.50)

## 2019-03-12 LAB — VITAMIN D 25 HYDROXY (VIT D DEFICIENCY, FRACTURES): VITD: 50.31 ng/mL (ref 30.00–100.00)

## 2019-03-12 MED ORDER — DENOSUMAB 60 MG/ML ~~LOC~~ SOSY
60.0000 mg | PREFILLED_SYRINGE | Freq: Once | SUBCUTANEOUS | Status: AC
  Administered 2019-03-12: 60 mg via SUBCUTANEOUS

## 2019-03-12 NOTE — Progress Notes (Addendum)
Tracy [name] is a 69 y.o. female presents to the office today for prolia  injections, per physician's orders. Original order  Prolia  inj prolia  (med), 49ml (dose),  SQ (route) was administered Left arm (location) today. Patient tolerated injection. Patient due for follow up labs/provider appt: Betti Cruz   The above order is mine.  Annye Asa, MD

## 2019-03-12 NOTE — Addendum Note (Signed)
Addended by: [name] on: 03/12/2019 09:52 AM   Modules accepted: Level of Service

## 2019-03-14 ENCOUNTER — Other Ambulatory Visit (INDEPENDENT_AMBULATORY_CARE_PROVIDER_SITE_OTHER): Payer: Medicare Other

## 2019-03-14 DIAGNOSIS — R7989 Other specified abnormal findings of blood chemistry: Secondary | ICD-10-CM

## 2019-03-14 LAB — T4, FREE: Free T4: 1.57 ng/dL (ref 0.60–1.60)

## 2019-03-14 LAB — T3, FREE: T3, Free: 3.4 pg/mL (ref 2.3–4.2)

## 2019-03-19 ENCOUNTER — Encounter: Payer: Self-pay | Admitting: Family Medicine

## 2019-03-22 ENCOUNTER — Encounter: Payer: Self-pay | Admitting: Family Medicine

## 2019-03-25 ENCOUNTER — Ambulatory Visit: Payer: Medicare Other | Admitting: Gastroenterology

## 2019-03-25 ENCOUNTER — Encounter: Payer: Self-pay | Admitting: Gastroenterology

## 2019-03-25 VITALS — BP 124/80 | HR 88 | Temp 98.6°F | Ht 63.5 in | Wt 163.0 lb

## 2019-03-25 DIAGNOSIS — K219 Gastro-esophageal reflux disease without esophagitis: Secondary | ICD-10-CM | POA: Diagnosis not present

## 2019-03-25 DIAGNOSIS — Z8719 Personal history of other diseases of the digestive system: Secondary | ICD-10-CM | POA: Diagnosis not present

## 2019-03-25 DIAGNOSIS — K5904 Chronic idiopathic constipation: Secondary | ICD-10-CM

## 2019-03-25 DIAGNOSIS — R1032 Left lower quadrant pain: Secondary | ICD-10-CM | POA: Diagnosis not present

## 2019-03-25 DIAGNOSIS — K5730 Diverticulosis of large intestine without perforation or abscess without bleeding: Secondary | ICD-10-CM | POA: Diagnosis not present

## 2019-03-25 NOTE — Patient Instructions (Signed)
Continue Miralax 1/2 capful daily  Take Benefiber 1 tablespoon twice daily  If you are age 71 or older, your body mass index should be between 23-30. Your Body mass index is 28.42 kg/m. If this is out of the aforementioned range listed, please consider follow up with your Primary Care Provider.  If you are age 23 or younger, your body mass index should be between 19-25. Your Body mass index is 28.42 kg/m. If this is out of the aformentioned range listed, please consider follow up with your Primary Care Provider.    I appreciate the  opportunity to care for you  Thank You   Harl Bowie , MD

## 2019-03-25 NOTE — Progress Notes (Signed)
Tracy [name]    [id number]    [birth year]  Primary Care Physician:Tabori, Aundra Millet, MD  Referring Physician: [name], MD [name] [place],  [place] [address]   Chief complaint: History of recurrent diverticulitis  HPI:  69 year old female with history of left-sided diverticulosis with h/o recurrent diverticulitis here for follow up visit. She was hospitalized in 2018 with acute diverticulitis complicated by abscess and microperforation, was treated with IV antibiotics.  She was doing well until recent episode of diverticulitis in December 2020.  She had an uncomplicated mid descending colon diverticulitis, treated with oral Augmentin. She feels nuts may have triggered this episode, she was eating a lot of cookies with pecans around holiday time.  She is doing well overall after completion of Augmentin, had mild antibiotic associated diarrhea which has since resolved.  She is taking MiraLAX half capful daily with regular bowel movements. Denies any nausea, vomiting, abdominal pain, melena or bright red blood per rectum  CT abdomen pelvis with contrast February 26, 2019: Diverticulosis with superimposed mild appearing diverticulitis along the mid descending colon. Negative for abscess or perforation. No other acute abnormality.  Colonoscopy January 05, 2017: Pancolonic diverticulosis and internal hemorrhoids.  Altered vascular pattern, random biopsies consistent with ischemic injury, no active inflammation or dysplasia.  Outpatient Encounter Medications as of 03/25/2019  Medication Sig  . Calcium Carbonate-Vitamin D (CALCIUM 600+D PO) Take 1 tablet by mouth 2 (two) times daily.  . Cholecalciferol (VITAMIN D) 2000 units tablet Take 2,000 Units by mouth daily.  . fenofibrate 160 MG tablet Take 1 tablet (160 mg total) by mouth daily.  . fluticasone (FLONASE) 50 MCG/ACT nasal spray USE 1 SPRAY INTO BOTH  NOSTRILS DAILY.  . Melatonin 5 MG TABS Take  5 mg by mouth at bedtime.  . metroNIDAZOLE (METROGEL) 0.75 % gel Apply topically.  . Multiple Vitamin (MULTIVITAMIN WITH MINERALS) TABS tablet Take 1 tablet by mouth daily.  . pantoprazole (PROTONIX) 40 MG tablet Take 1 tablet (40 mg total) by mouth 2 (two) times daily.  . polyethylene glycol (MIRALAX / GLYCOLAX) packet Take 17 g by mouth daily.  . Polyethylene Glycol 400 (BLINK TEARS OP) Place 1 drop into both eyes daily as needed (dry eyes).  . [DISCONTINUED] clotrimazole (LOTRIMIN) 1 % cream Apply 1 application topically 2 (two) times daily.  . [DISCONTINUED] doxycycline (VIBRAMYCIN) 100 MG capsule Take 100 mg by mouth 2 (two) times daily as needed (rosacea).  . [DISCONTINUED] metoprolol tartrate (LOPRESSOR) 100 MG tablet Take 1 tablet (100 mg total) by mouth once for 1 dose. TAKE 1 TABLET TWO HOURS PRIOR TO YOUR CORONARY CTA   No facility-administered encounter medications on file as of 03/25/2019.    Allergies as of 03/25/2019 - Review Complete 03/25/2019  Allergen Reaction Noted  . Augmentin [amoxicillin-pot clavulanate] Diarrhea 03/25/2019  . Demerol [meperidine] Nausea And Vomiting 12/13/2013  . Latex Rash and Other (See Comments) 07/28/2015  . Other Itching 06/14/2017  . Tramadol Nausea And Vomiting 06/14/2017    Past Medical History:  Diagnosis Date  . Allergy   . Anxiety   . Arthritis   . Bursitis of left hip   . C. difficile diarrhea   . Cancer (HCC)    basil cell  . Cataract    bilateral  . Complication of anesthesia   . Diverticulitis   . GERD (gastroesophageal reflux disease)   . Hx: UTI (urinary tract infection)   .  Hyperlipidemia   . Hyperthyroidism   . Osteopenia   . Osteoporosis   . PONV (postoperative nausea and vomiting)   . Rosacea   . Rotator cuff arthropathy, left     Past Surgical History:  Procedure Laterality Date  . APPENDECTOMY    . BACK SURGERY    . CATARACT EXTRACTION, BILATERAL  11/2018  . CHOLECYSTECTOMY    . FOOT SURGERY Right    X  2  . KNEE SURGERY    . LAPAROSCOPIC LIVER CYST FENESTRATION    . OPEN SURGICAL REPAIR OF GLUTEAL TENDON Left 04/01/2014   Procedure: LEFT HIP BURSECTOMY WITH GLUTEAL TENDON REPAIR;  Surgeon: Gearlean Alf, MD;  Location: [facility];  Service: Orthopedics;  Laterality: Left;  . REVERSE SHOULDER ARTHROPLASTY Right 06/23/2017   Procedure: RIGHT REVERSE SHOULDER ARTHROPLASTY;  Surgeon: Netta Cedars, MD;  Location: [facility];  Service: Orthopedics;  Laterality: Right;  . TONSILLECTOMY    . TOTAL KNEE ARTHROPLASTY Right 08/17/2015   Procedure: RIGHT TOTAL KNEE ARTHROPLASTY;  Surgeon: Gaynelle Arabian, MD;  Location: [facility];  Service: Orthopedics;  Laterality: Right;  . TUBAL LIGATION      Family History  Problem Relation Age of Onset  . Hypertension Mother   . Stroke Mother   . Liver disease Father        alcohol related  . Bone cancer Paternal Grandfather   . Cancer Neg Hx   . Diabetes Neg Hx   . Colon cancer Neg Hx   . Esophageal cancer Neg Hx   . Pancreatic cancer Neg Hx   . Stomach cancer Neg Hx     Social History   Socioeconomic History  . Marital status: Married    Spouse name: Not on file  . Number of children: Not on file  . Years of education: Not on file  . Highest education level: Not on file  Occupational History  . Not on file  Tobacco Use  . Smoking status: Never Smoker  . Smokeless tobacco: Never Used  Substance and Sexual Activity  . Alcohol use: No  . Drug use: No  . Sexual activity: Not Currently  Other Topics Concern  . Not on file  Social History Narrative  . Not on file   Social Determinants of Health   Financial Resource Strain:   . Difficulty of Paying Living Expenses: Not on file  Food Insecurity:   . Worried About Charity fundraiser in the Last Year: Not on file  . Ran Out of Food in the Last Year: Not on file  Transportation Needs:   . Lack of Transportation (Medical): Not on file  . Lack of Transportation (Non-Medical): Not on file  Physical  Activity:   . Days of Exercise per Week: Not on file  . Minutes of Exercise per Session: Not on file  Stress:   . Feeling of Stress : Not on file  Social Connections:   . Frequency of Communication with Friends and Family: Not on file  . Frequency of Social Gatherings with Friends and Family: Not on file  . Attends Religious Services: Not on file  . Active Member of Clubs or Organizations: Not on file  . Attends Archivist Meetings: Not on file  . Marital Status: Not on file  Intimate Partner Violence:   . Fear of Current or Ex-Partner: Not on file  . Emotionally Abused: Not on file  . Physically Abused: Not on file  . Sexually Abused: Not on file  Review of systems: Review of Systems  Constitutional: Negative for fever and chills.  HENT: Negative.   Eyes: Negative for blurred vision.  Respiratory: Negative for cough, shortness of breath and wheezing.   Cardiovascular: Negative for chest pain and palpitations.  Gastrointestinal: as per HPI Genitourinary: Negative for dysuria, urgency, frequency and hematuria.  Musculoskeletal: Negative for myalgias, back pain and joint pain.  Skin: Negative for itching and rash.  Neurological: Negative for dizziness, tremors, focal weakness, seizures and loss of consciousness.  Endo/Heme/Allergies: Negative.  Psychiatric/Behavioral: Negative for depression, suicidal ideas and hallucinations.  All other systems reviewed and are negative.   Physical Exam: Vitals:   03/25/19 1052  BP: 124/80  Pulse: 88  Temp: 98.6 F (37 C)   Body mass index is 28.42 kg/m. Gen:      No acute distress HEENT:  EOMI, sclera anicteric Neck:     No masses; no thyromegaly Lungs:    Clear to auscultation bilaterally; normal respiratory effort CV:         Regular rate and rhythm; no murmurs Abd:      + bowel sounds; soft, non-tender; no palpable masses, no distension Ext:    No edema; adequate peripheral perfusion Skin:      Warm and dry; no  rash Neuro: alert and oriented x 3 Psych: normal mood and affect  Data Reviewed:  Reviewed labs, radiology imaging, old records and pertinent past GI work up   Assessment and Plan/Recommendations:  69 year old female with history of chronic GERD, colonic diverticulosis with recurrent left-sided diverticulitis here for follow-up visit  Diverticulitis has resolved, even though she has history of recurrent diverticulitis will hold off referral to surgery for segmental colectomy given she has pancolonic diverticulosis and her advanced age Stop probiotics, no data to support long-term use of probiotics in this scenario  Chronic idiopathic constipation: Continue MiraLAX half capful daily, titrate to have 1-2 soft bowel movements daily Start Benefiber 1 teaspoon twice daily with meals  GERD: Continue Protonix and antireflux measures  This visit required 30 minutes of patient care (this includes precharting, chart review, review of results, face-to-face time used for counseling as well as treatment plan and follow-up. The patient was provided an opportunity to ask questions and all were answered. The patient agreed with the plan and demonstrated an understanding of the instructions.  Damaris Hippo , MD    CC: [name], MD

## 2019-04-11 ENCOUNTER — Ambulatory Visit: Payer: Medicare Other

## 2019-04-12 ENCOUNTER — Encounter: Payer: Self-pay | Admitting: Family Medicine

## 2019-05-16 ENCOUNTER — Other Ambulatory Visit: Payer: Self-pay | Admitting: General Practice

## 2019-05-16 ENCOUNTER — Telehealth: Payer: Self-pay | Admitting: Radiology

## 2019-05-16 NOTE — Telephone Encounter (Signed)
It will not let me delete these. Gives me an error code. Will it let you remove them?

## 2019-05-16 NOTE — Telephone Encounter (Signed)
Exercise treadmill test was ordered back in June 2020 when treadmill room was shut down due to covid. Please advise if patient still needs test scheduled. Currently patients are being asked to have a covid screening 4 days prior and will be required to self quarantine until test.   If no longer needed can you please cancel the order

## 2019-05-16 NOTE — Telephone Encounter (Signed)
I have cancelled the order.

## 2019-05-16 NOTE — Telephone Encounter (Signed)
Pt saw Dr Gwenlyn Found in August- we can cancel this stress test

## 2019-05-30 ENCOUNTER — Other Ambulatory Visit: Payer: Self-pay | Admitting: Family Medicine

## 2019-07-04 DIAGNOSIS — Z961 Presence of intraocular lens: Secondary | ICD-10-CM | POA: Diagnosis not present

## 2019-07-04 DIAGNOSIS — H04123 Dry eye syndrome of bilateral lacrimal glands: Secondary | ICD-10-CM | POA: Diagnosis not present

## 2019-07-04 DIAGNOSIS — H35363 Drusen (degenerative) of macula, bilateral: Secondary | ICD-10-CM | POA: Diagnosis not present

## 2019-07-04 DIAGNOSIS — H26493 Other secondary cataract, bilateral: Secondary | ICD-10-CM | POA: Diagnosis not present

## 2019-07-04 DIAGNOSIS — H35372 Puckering of macula, left eye: Secondary | ICD-10-CM | POA: Diagnosis not present

## 2019-07-12 ENCOUNTER — Encounter (HOSPITAL_COMMUNITY): Payer: Self-pay

## 2019-07-12 ENCOUNTER — Emergency Department (HOSPITAL_COMMUNITY): Payer: Medicare Other

## 2019-07-12 ENCOUNTER — Emergency Department (HOSPITAL_COMMUNITY)
Admission: EM | Admit: 2019-07-12 | Discharge: 2019-07-12 | Disposition: A | Discharge status: Home / Self Care | Payer: Medicare Other | Attending: Emergency Medicine | Admitting: Emergency Medicine

## 2019-07-12 ENCOUNTER — Other Ambulatory Visit: Payer: Self-pay

## 2019-07-12 DIAGNOSIS — S0990XA Unspecified injury of head, initial encounter: Secondary | ICD-10-CM | POA: Diagnosis present

## 2019-07-12 DIAGNOSIS — I10 Essential (primary) hypertension: Secondary | ICD-10-CM | POA: Diagnosis not present

## 2019-07-12 DIAGNOSIS — Y93H2 Activity, gardening and landscaping: Secondary | ICD-10-CM | POA: Diagnosis not present

## 2019-07-12 DIAGNOSIS — Z96611 Presence of right artificial shoulder joint: Secondary | ICD-10-CM | POA: Diagnosis not present

## 2019-07-12 DIAGNOSIS — W19XXXA Unspecified fall, initial encounter: Secondary | ICD-10-CM

## 2019-07-12 DIAGNOSIS — S0081XA Abrasion of other part of head, initial encounter: Secondary | ICD-10-CM | POA: Diagnosis not present

## 2019-07-12 DIAGNOSIS — Z96651 Presence of right artificial knee joint: Secondary | ICD-10-CM | POA: Insufficient documentation

## 2019-07-12 DIAGNOSIS — Z79899 Other long term (current) drug therapy: Secondary | ICD-10-CM | POA: Diagnosis not present

## 2019-07-12 DIAGNOSIS — S0093XA Contusion of unspecified part of head, initial encounter: Secondary | ICD-10-CM | POA: Diagnosis not present

## 2019-07-12 DIAGNOSIS — R519 Headache, unspecified: Secondary | ICD-10-CM | POA: Diagnosis not present

## 2019-07-12 DIAGNOSIS — Y999 Unspecified external cause status: Secondary | ICD-10-CM | POA: Insufficient documentation

## 2019-07-12 DIAGNOSIS — M79642 Pain in left hand: Secondary | ICD-10-CM | POA: Diagnosis not present

## 2019-07-12 DIAGNOSIS — Z91040 Latex allergy status: Secondary | ICD-10-CM | POA: Insufficient documentation

## 2019-07-12 DIAGNOSIS — T148XXA Other injury of unspecified body region, initial encounter: Secondary | ICD-10-CM

## 2019-07-12 DIAGNOSIS — Y929 Unspecified place or not applicable: Secondary | ICD-10-CM | POA: Insufficient documentation

## 2019-07-12 DIAGNOSIS — S0083XA Contusion of other part of head, initial encounter: Secondary | ICD-10-CM | POA: Diagnosis not present

## 2019-07-12 DIAGNOSIS — W010XXA Fall on same level from slipping, tripping and stumbling without subsequent striking against object, initial encounter: Secondary | ICD-10-CM | POA: Diagnosis not present

## 2019-07-12 MED ORDER — BACITRACIN ZINC 500 UNIT/GM EX OINT
TOPICAL_OINTMENT | CUTANEOUS | Status: AC
  Administered 2019-07-12: 3
  Filled 2019-07-12: qty 2.7

## 2019-07-12 NOTE — ED Notes (Signed)
Pt transported to CT ?
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
## 2019-07-12 NOTE — ED Triage Notes (Signed)
Pt reports walking outside and tripping over either a hose or her feet. Pt reports multiple surgeries on right foot, which sometimes causes it to drag. Pt fell forward on her face onto the concrete. Pt has abrasions to left forehead, under left eye, and on the bridge of her nose. Mild bleeding at this time. Pt denies taking blood thinners. Pt also reports some soreness to left wrist and left knee.

## 2019-07-12 NOTE — Discharge Instructions (Addendum)
Per our discussion, I would recommend Tylenol and ibuprofen as needed for management of your pain.  Please apply triple antibiotic or Neosporin once or twice a day to the abrasions.  Please follow-up with your primary care provider you find that your symptoms not alleviated in the next few days.  You can always return to the emergency department for reevaluation if you have any new or worsening symptoms.  It was a pleasure to meet you.

## 2019-07-14 ENCOUNTER — Encounter: Payer: Self-pay | Admitting: Family Medicine

## 2019-07-15 ENCOUNTER — Encounter: Payer: Self-pay | Admitting: Family Medicine

## 2019-07-15 ENCOUNTER — Ambulatory Visit (INDEPENDENT_AMBULATORY_CARE_PROVIDER_SITE_OTHER): Payer: Medicare Other | Admitting: Family Medicine

## 2019-07-15 ENCOUNTER — Other Ambulatory Visit: Payer: Self-pay

## 2019-07-15 VITALS — BP 134/84 | HR 78 | Temp 97.9°F | Resp 16 | Wt 167.0 lb

## 2019-07-15 DIAGNOSIS — R296 Repeated falls: Secondary | ICD-10-CM | POA: Diagnosis not present

## 2019-07-15 DIAGNOSIS — M21961 Unspecified acquired deformity of right lower leg: Secondary | ICD-10-CM

## 2019-07-15 DIAGNOSIS — R269 Unspecified abnormalities of gait and mobility: Secondary | ICD-10-CM | POA: Diagnosis not present

## 2019-07-15 NOTE — Progress Notes (Signed)
   Subjective:    Patient ID: Tracy [name], female    DOB: 1951/02/04, 69 y.o.   MRN: [id number]  HPI Falls- pt fell on 5/14 and was seen in ER.  'my balance I guess is no good'.  Husband reports she tripped on the side of the sidewalk when reaching for hose.  She has known Trendelenburg gait.  'it's impossible to walk balanced'.  Has seen Dr Wynelle Link for hips and glutes and was told that they couldn't fix her gait.  Pt was told she needs her L knee replaced but she declines.  Pt reports that when she stumbles 'it is impossible to recover'  She has R TKR and R shoulder replacement that she is very mindful of not landing on them.  Her previous fall involved tripping over a suitcase in the dark.  Has R foot deformity that will catch when she is walking.   Review of Systems For ROS see HPI   This visit occurred during the SARS-CoV-2 public health emergency.  Safety protocols were in place, including screening questions prior to the visit, additional usage of staff PPE, and extensive cleaning of exam room while observing appropriate contact time as indicated for disinfecting solutions.       Objective:   Physical Exam Vitals reviewed.  Constitutional:      General: She is not in acute distress.    Appearance: She is not ill-appearing.  HENT:     Head:     Comments: Multiple abrasions and contusions of face Eyes:     Extraocular Movements: Extraocular movements intact.     Pupils: Pupils are equal, round, and reactive to light.  Musculoskeletal:     Comments: R foot deformity w/ toes deviating medially  Skin:    General: Skin is warm and dry.     Findings: Bruising (facial bruising) present.  Neurological:     Mental Status: She is alert.     Gait: Gait abnormal (trendelenburg gait).  Psychiatric:     Comments: Tearful, upset           Assessment & Plan:  Foot deformity- ongoing issue for pt and if not wearing sneakers will contribute to falls.  Will refer to foot specialist  for evaluation and tx if needed.  Pt expressed understanding and is in agreement w/ plan.   Trendelenburg gait- ongoing issue for pt.  Will refer to PT to assess with strengthening, balance, and stability.  Pt expressed understanding and is in agreement w/ plan.   Frequent falls- discussed need to eliminate possible hazards in her home (area rugs, suitcases, etc) and the need to use her cane or walker for support until evaluated by PT.  Pt expressed understanding and is in agreement w/ plan.

## 2019-07-15 NOTE — Patient Instructions (Signed)
Follow up as needed or as scheduled We'll call you with your PT and Ortho appts Make sure any potential obstacles are removed around the house Use your cane or walker for support Call with any questions or concerns Hang in there!

## 2019-07-23 DIAGNOSIS — R262 Difficulty in walking, not elsewhere classified: Secondary | ICD-10-CM | POA: Diagnosis not present

## 2019-07-23 DIAGNOSIS — R269 Unspecified abnormalities of gait and mobility: Secondary | ICD-10-CM | POA: Diagnosis not present

## 2019-07-23 DIAGNOSIS — R296 Repeated falls: Secondary | ICD-10-CM | POA: Diagnosis not present

## 2019-07-23 DIAGNOSIS — R42 Dizziness and giddiness: Secondary | ICD-10-CM | POA: Diagnosis not present

## 2019-07-24 ENCOUNTER — Encounter: Payer: Self-pay | Admitting: Family Medicine

## 2019-07-24 ENCOUNTER — Telehealth: Payer: Self-pay | Admitting: Family Medicine

## 2019-07-24 NOTE — Telephone Encounter (Signed)
Paperwork given to PCP.  

## 2019-07-24 NOTE — Telephone Encounter (Signed)
I have placed a plan of care from [facility] in the bin upfront with a charge sheet.   Please advise

## 2019-07-25 NOTE — Telephone Encounter (Signed)
Form completed and placed in basket  

## 2019-07-25 NOTE — Telephone Encounter (Signed)
FYI

## 2019-07-25 NOTE — Telephone Encounter (Signed)
Picked up from the back, faxed and sent to scan.  °

## 2019-07-26 ENCOUNTER — Encounter: Payer: Self-pay | Admitting: Family Medicine

## 2019-07-26 DIAGNOSIS — R296 Repeated falls: Secondary | ICD-10-CM | POA: Diagnosis not present

## 2019-07-26 DIAGNOSIS — R269 Unspecified abnormalities of gait and mobility: Secondary | ICD-10-CM | POA: Diagnosis not present

## 2019-07-26 DIAGNOSIS — R42 Dizziness and giddiness: Secondary | ICD-10-CM | POA: Diagnosis not present

## 2019-07-26 DIAGNOSIS — R262 Difficulty in walking, not elsewhere classified: Secondary | ICD-10-CM | POA: Diagnosis not present

## 2019-07-30 DIAGNOSIS — R42 Dizziness and giddiness: Secondary | ICD-10-CM | POA: Diagnosis not present

## 2019-07-30 DIAGNOSIS — R269 Unspecified abnormalities of gait and mobility: Secondary | ICD-10-CM | POA: Diagnosis not present

## 2019-07-30 DIAGNOSIS — R262 Difficulty in walking, not elsewhere classified: Secondary | ICD-10-CM | POA: Diagnosis not present

## 2019-07-30 DIAGNOSIS — R296 Repeated falls: Secondary | ICD-10-CM | POA: Diagnosis not present

## 2019-08-01 DIAGNOSIS — R262 Difficulty in walking, not elsewhere classified: Secondary | ICD-10-CM | POA: Diagnosis not present

## 2019-08-01 DIAGNOSIS — R269 Unspecified abnormalities of gait and mobility: Secondary | ICD-10-CM | POA: Diagnosis not present

## 2019-08-01 DIAGNOSIS — R296 Repeated falls: Secondary | ICD-10-CM | POA: Diagnosis not present

## 2019-08-01 DIAGNOSIS — R42 Dizziness and giddiness: Secondary | ICD-10-CM | POA: Diagnosis not present

## 2019-08-05 DIAGNOSIS — R269 Unspecified abnormalities of gait and mobility: Secondary | ICD-10-CM | POA: Diagnosis not present

## 2019-08-05 DIAGNOSIS — R42 Dizziness and giddiness: Secondary | ICD-10-CM | POA: Diagnosis not present

## 2019-08-05 DIAGNOSIS — R296 Repeated falls: Secondary | ICD-10-CM | POA: Diagnosis not present

## 2019-08-05 DIAGNOSIS — R262 Difficulty in walking, not elsewhere classified: Secondary | ICD-10-CM | POA: Diagnosis not present

## 2019-08-08 DIAGNOSIS — R262 Difficulty in walking, not elsewhere classified: Secondary | ICD-10-CM | POA: Diagnosis not present

## 2019-08-08 DIAGNOSIS — R296 Repeated falls: Secondary | ICD-10-CM | POA: Diagnosis not present

## 2019-08-08 DIAGNOSIS — R269 Unspecified abnormalities of gait and mobility: Secondary | ICD-10-CM | POA: Diagnosis not present

## 2019-08-08 DIAGNOSIS — R42 Dizziness and giddiness: Secondary | ICD-10-CM | POA: Diagnosis not present

## 2019-08-09 DIAGNOSIS — M79674 Pain in right toe(s): Secondary | ICD-10-CM | POA: Diagnosis not present

## 2019-08-12 DIAGNOSIS — R269 Unspecified abnormalities of gait and mobility: Secondary | ICD-10-CM | POA: Diagnosis not present

## 2019-08-12 DIAGNOSIS — R42 Dizziness and giddiness: Secondary | ICD-10-CM | POA: Diagnosis not present

## 2019-08-12 DIAGNOSIS — R296 Repeated falls: Secondary | ICD-10-CM | POA: Diagnosis not present

## 2019-08-12 DIAGNOSIS — R262 Difficulty in walking, not elsewhere classified: Secondary | ICD-10-CM | POA: Diagnosis not present

## 2019-08-15 DIAGNOSIS — R42 Dizziness and giddiness: Secondary | ICD-10-CM | POA: Diagnosis not present

## 2019-08-15 DIAGNOSIS — R269 Unspecified abnormalities of gait and mobility: Secondary | ICD-10-CM | POA: Diagnosis not present

## 2019-08-15 DIAGNOSIS — R296 Repeated falls: Secondary | ICD-10-CM | POA: Diagnosis not present

## 2019-08-15 DIAGNOSIS — R262 Difficulty in walking, not elsewhere classified: Secondary | ICD-10-CM | POA: Diagnosis not present

## 2019-08-19 DIAGNOSIS — R262 Difficulty in walking, not elsewhere classified: Secondary | ICD-10-CM | POA: Diagnosis not present

## 2019-08-19 DIAGNOSIS — R269 Unspecified abnormalities of gait and mobility: Secondary | ICD-10-CM | POA: Diagnosis not present

## 2019-08-19 DIAGNOSIS — R296 Repeated falls: Secondary | ICD-10-CM | POA: Diagnosis not present

## 2019-08-19 DIAGNOSIS — R42 Dizziness and giddiness: Secondary | ICD-10-CM | POA: Diagnosis not present

## 2019-08-22 DIAGNOSIS — H26493 Other secondary cataract, bilateral: Secondary | ICD-10-CM | POA: Diagnosis not present

## 2019-08-22 DIAGNOSIS — R42 Dizziness and giddiness: Secondary | ICD-10-CM | POA: Diagnosis not present

## 2019-08-22 DIAGNOSIS — R296 Repeated falls: Secondary | ICD-10-CM | POA: Diagnosis not present

## 2019-08-22 DIAGNOSIS — H16223 Keratoconjunctivitis sicca, not specified as Sjogren's, bilateral: Secondary | ICD-10-CM | POA: Diagnosis not present

## 2019-08-22 DIAGNOSIS — R269 Unspecified abnormalities of gait and mobility: Secondary | ICD-10-CM | POA: Diagnosis not present

## 2019-08-22 DIAGNOSIS — H04123 Dry eye syndrome of bilateral lacrimal glands: Secondary | ICD-10-CM | POA: Diagnosis not present

## 2019-08-22 DIAGNOSIS — R262 Difficulty in walking, not elsewhere classified: Secondary | ICD-10-CM | POA: Diagnosis not present

## 2019-08-26 DIAGNOSIS — R262 Difficulty in walking, not elsewhere classified: Secondary | ICD-10-CM | POA: Diagnosis not present

## 2019-08-26 DIAGNOSIS — R269 Unspecified abnormalities of gait and mobility: Secondary | ICD-10-CM | POA: Diagnosis not present

## 2019-08-26 DIAGNOSIS — R42 Dizziness and giddiness: Secondary | ICD-10-CM | POA: Diagnosis not present

## 2019-08-26 DIAGNOSIS — R296 Repeated falls: Secondary | ICD-10-CM | POA: Diagnosis not present

## 2019-08-29 DIAGNOSIS — R42 Dizziness and giddiness: Secondary | ICD-10-CM | POA: Diagnosis not present

## 2019-08-29 DIAGNOSIS — R262 Difficulty in walking, not elsewhere classified: Secondary | ICD-10-CM | POA: Diagnosis not present

## 2019-08-29 DIAGNOSIS — R269 Unspecified abnormalities of gait and mobility: Secondary | ICD-10-CM | POA: Diagnosis not present

## 2019-08-29 DIAGNOSIS — R296 Repeated falls: Secondary | ICD-10-CM | POA: Diagnosis not present

## 2019-09-02 ENCOUNTER — Encounter: Payer: Self-pay | Admitting: Family Medicine

## 2019-09-03 NOTE — Telephone Encounter (Signed)
Please check and see if we have a Prolia shot for her in the office. Or any non-labeled. If not then would need to proceed with order. Patina can help you with this in Jess B's absence.

## 2019-09-13 ENCOUNTER — Ambulatory Visit (INDEPENDENT_AMBULATORY_CARE_PROVIDER_SITE_OTHER): Payer: Medicare Other

## 2019-09-13 ENCOUNTER — Other Ambulatory Visit: Payer: Self-pay

## 2019-09-13 DIAGNOSIS — M810 Age-related osteoporosis without current pathological fracture: Secondary | ICD-10-CM

## 2019-09-13 MED ORDER — DENOSUMAB 60 MG/ML ~~LOC~~ SOSY
60.0000 mg | PREFILLED_SYRINGE | Freq: Once | SUBCUTANEOUS | Status: AC
  Administered 2019-09-13: 60 mg via SUBCUTANEOUS

## 2019-09-13 NOTE — Progress Notes (Addendum)
Tracy [name], 69y.o. female, who presents to the office today for her prolia injection per physician's orders. Prolia 60mg /ml SQ was administered in the  left arm. Patient tolerated injection and left the office in good condition. Patient was informed to call the office when her next injection is due. Fritz Pickerel, LPN  The above order was mine.  Annye Asa, MD

## 2019-09-16 NOTE — Addendum Note (Signed)
Addended by: [name] on: 09/16/2019 08:13 AM   Modules accepted: Level of Service

## 2019-10-23 DIAGNOSIS — Z1231 Encounter for screening mammogram for malignant neoplasm of breast: Secondary | ICD-10-CM | POA: Diagnosis not present

## 2019-10-30 DIAGNOSIS — Z85828 Personal history of other malignant neoplasm of skin: Secondary | ICD-10-CM | POA: Diagnosis not present

## 2019-10-30 DIAGNOSIS — D2271 Melanocytic nevi of right lower limb, including hip: Secondary | ICD-10-CM | POA: Diagnosis not present

## 2019-10-30 DIAGNOSIS — D225 Melanocytic nevi of trunk: Secondary | ICD-10-CM | POA: Diagnosis not present

## 2019-10-30 DIAGNOSIS — D2272 Melanocytic nevi of left lower limb, including hip: Secondary | ICD-10-CM | POA: Diagnosis not present

## 2019-10-30 DIAGNOSIS — L821 Other seborrheic keratosis: Secondary | ICD-10-CM | POA: Diagnosis not present

## 2019-11-13 ENCOUNTER — Telehealth: Payer: Self-pay | Admitting: Family Medicine

## 2019-11-13 NOTE — Chronic Care Management (AMB) (Signed)
  Chronic Care Management   Note  11/13/2019 Name: [name] MRN: [id number] DOB: 1950/07/07  Tracy [name] is a 69 y.o. year old female who is a primary care patient of Birdie Riddle, Aundra Millet, MD. I reached out to Tracy [name] by phone today in response to a referral sent by Ms. Jacques Navy PCP, [name], MD.   [name] was given information about Chronic Care Management services today including:  1. CCM service includes personalized support from designated clinical staff supervised by her physician, including individualized plan of care and coordination with other care providers 2. 24/7 contact phone numbers for assistance for urgent and routine care needs. 3. Service will only be billed when office clinical staff spend 20 minutes or more in a month to coordinate care. 4. Only one practitioner may furnish and bill the service in a calendar month. 5. The patient may stop CCM services at any time (effective at the end of the month) by phone call to the office staff.     Follow up plan:   Lauretta Grill [employer] Scheduler

## 2019-11-16 ENCOUNTER — Encounter: Payer: Self-pay | Admitting: Family Medicine

## 2019-11-20 ENCOUNTER — Encounter: Payer: Self-pay | Admitting: Family Medicine

## 2019-11-21 ENCOUNTER — Other Ambulatory Visit: Payer: Self-pay

## 2019-11-21 ENCOUNTER — Telehealth: Payer: Self-pay

## 2019-11-21 ENCOUNTER — Ambulatory Visit: Payer: Medicare Other

## 2019-11-21 DIAGNOSIS — M810 Age-related osteoporosis without current pathological fracture: Secondary | ICD-10-CM

## 2019-11-21 DIAGNOSIS — F418 Other specified anxiety disorders: Secondary | ICD-10-CM

## 2019-11-21 NOTE — Patient Instructions (Addendum)
Please review care plan below and call me at [phone] (direct line) with any questions!  Thank you, Edyth Gunnels., Clinical Pharmacist  Goals Addressed            This Visit's Progress   . PharmD Care Plan       CARE PLAN ENTRY (see longitudinal plan of care for additional care plan information)  Current Barriers:  . Chronic Disease Management support, education, and care coordination needs related to Anxiety and Osteoporosis   Osteoporosis . Pharmacist Clinical Goal(s) o Over the next 180 days, patient will work with PharmD and providers to prevent falls/fractures . Current regimen:  o Prolia injection every six months . Interventions: o Review calcium/vitamin D3 supplementation o Reviewed coverage options for Prolia and discussing plans with Medicare counselor [facility]): if interested their number is [phone] . Patient self care activities - Over the next 180 days, patient will: o Continue daily walks o Ensure adequate supplementation with calcium (1200 mg/day in divided doses) and vitamin D3 (1000 units per day)   Depression/anxiety . Pharmacist Clinical Goal(s) o Over the next 365 days, patient will work with PharmD and providers to minimize anxiety related symptoms . Current regimen:  o No current medications . Interventions: o Discussed potential medication options and how they differ from Cymbalta (duloxetine). One example we discussed was sertraline (Zoloft), which can be started at a very low dose to minimize potential for side effects . Patient self care activities - Over the next 365 days, patient will: o Reach out to PCP or pharmacist if wanting to explore alternative treatments  Medication management . Pharmacist Clinical Goal(s): o Over the next 180 days, patient will work with PharmD and providers to maintain optimal medication adherence . Current pharmacy: [facility] . Interventions o Comprehensive medication review performed. o Continue  current medication management strategy . Patient self care activities - Over the next 180 days, patient will: o Take medications as prescribed o Report any questions or concerns to PharmD and/or provider(s) Initial goal documentation.      Ms. Couse was given information about Chronic Care Management services today including:  1. CCM service includes personalized support from designated clinical staff supervised by her physician, including individualized plan of care and coordination with other care providers 2. 24/7 contact phone numbers for assistance for urgent and routine care needs. 3. Standard insurance, coinsurance, copays and deductibles apply for chronic care management only during months in which we provide at least 20 minutes of these services. Most insurances cover these services at 100%, however patients may be responsible for any copay, coinsurance and/or deductible if applicable. This service may help you avoid the need for more expensive face-to-face services. 4. Only one practitioner may furnish and bill the service in a calendar month. 5. The patient may stop CCM services at any time (effective at the end of the month) by phone call to the office staff.  Patient agreed to services and verbal consent obtained.   The patient verbalized understanding of instructions provided today and agreed to receive a mailed copy of patient instruction and/or educational materials. Telephone follow up appointment with pharmacy team member scheduled for: See next appointment with "Care Management Staff" under "What's Next" below.   Madelin Rear, Pharm.D., BCGP Clinical Pharmacist [facility] [phone]  Osteoporosis  Osteoporosis happens when your bones get thin and weak. This can cause your bones to break (fracture) more easily. You can do things at home to make your bones stronger. Follow  these instructions at home:  Activity  Exercise as told by your doctor. Ask your  doctor what activities are safe for you. You should do: ? Exercises that make your muscles work to hold your body weight up (weight-bearing exercises). These include tai chi, yoga, and walking. ? Exercises to make your muscles stronger. One example is lifting weights. Lifestyle  Limit alcohol intake to no more than 1 drink a day for nonpregnant women and 2 drinks a day for men. One drink equals 12 oz of beer, 5 oz of wine, or 1 oz of hard liquor.  Do not use any products that have nicotine or tobacco in them. These include cigarettes and e-cigarettes. If you need help quitting, ask your doctor. Preventing falls  Use tools to help you move around (mobility aids) as needed. These include canes, walkers, scooters, and crutches.  Keep rooms well-lit and free of clutter.  Put away things that could make you trip. These include cords and rugs.  Install safety rails on stairs. Install grab bars in bathrooms.  Use rubber mats in slippery areas, like bathrooms.  Wear shoes that: ? Fit you well. ? Support your feet. ? Have closed toes. ? Have rubber soles or low heels.  Tell your doctor about all of the medicines you are taking. Some medicines can make you more likely to fall. General instructions  Eat plenty of calcium and vitamin D. These nutrients are good for your bones. Good sources of calcium and vitamin D include: ? Some fatty fish, such as salmon and tuna. ? Foods that have calcium and vitamin D added to them (fortified foods). For example, some breakfast cereals are fortified with calcium and vitamin D. ? Egg yolks. ? Cheese. ? Liver.  Take over-the-counter and prescription medicines only as told by your doctor.  Keep all follow-up visits as told by your doctor. This is important. Contact a doctor if:  You have not been tested (screened) for osteoporosis and you are: ? A woman who is age 69 or older. ? A man who is age 81 or older. Get help right away if:  You  fall.  You get hurt. Summary  Osteoporosis happens when your bones get thin and weak.  Weak bones can break (fracture) more easily.  Eat plenty of calcium and vitamin D. These nutrients are good for your bones.  Tell your doctor about all of the medicines that you take. This information is not intended to replace advice given to you by your health care provider. Make sure you discuss any questions you have with your health care provider. Document Revised: 01/27/2017 Document Reviewed: 12/09/2016 Elsevier Patient Education  2020 [employer].

## 2019-11-21 NOTE — Progress Notes (Signed)
Chronic Care Management Pharmacy  Name: Tracy [name]  MRN: [id number] DOB: 1950-11-25  Chief Complaint/ HPI  Tracy [name],  69 y.o., female presents for their Initial CCM visit with the clinical pharmacist via telephone due to COVID-19 Pandemic.  PCP : [name], MD  Chronic conditions include:  Encounter Diagnoses  Name Primary?  . Depression with anxiety Yes  . Age-related osteoporosis without current pathological fracture     Office Visits:  07/15/2019 (PCP): refer to PT and ortho, frequent falls.  Consult Visit: 07/12/2019 (ED): fall, hematoma of head. Patient Active Problem List   Diagnosis Date Noted  . Dyspnea on exertion 10/10/2018  . Overweight (BMI 25.0-29.9) 11/28/2017  . 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 Shoulder arthritis 03/01/2017  . Perforation of sigmoid colon due to diverticulitis 07/05/2016  . OA (osteoarthritis) of knee 08/17/2015  . 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 Subclinical hyperthyroidism 06/05/2014  . Trochanteric bursitis of left hip 04/01/2014  . Rosacea 03/26/2014  . Allergic rhinitis 02/19/2014  . Left hip pain 12/19/2013  . Atrophic vaginitis 10/07/2013  . Screening for malignant neoplasm of the cervix 01/12/2011  . Physical exam 01/12/2011  . HYPERTRIGLYCERIDEMIA 12/03/2009  . GERD 12/03/2009  . UTI'S, RECURRENT 12/03/2009  . LEG CRAMPS, NOCTURNAL 12/03/2009  . DIVERTICULITIS, HX OF 12/03/2009  . Depression with anxiety 11/05/2009  . Osteoporosis 11/05/2009   Past Surgical History:  Procedure Laterality Date  . APPENDECTOMY    . BACK SURGERY    . CATARACT EXTRACTION, BILATERAL  11/2018  . CHOLECYSTECTOMY    . FOOT SURGERY Right    X 2  . KNEE SURGERY    . LAPAROSCOPIC LIVER CYST FENESTRATION    . OPEN SURGICAL REPAIR OF GLUTEAL TENDON Left 04/01/2014   Procedure: LEFT HIP BURSECTOMY WITH GLUTEAL TENDON REPAIR;  Surgeon: Gearlean Alf, MD;  Location: [facility];  Service: Orthopedics;  Laterality:  Left;  . REVERSE SHOULDER ARTHROPLASTY Right 06/23/2017   Procedure: RIGHT REVERSE SHOULDER ARTHROPLASTY;  Surgeon: Netta Cedars, MD;  Location: [facility];  Service: Orthopedics;  Laterality: Right;  . TONSILLECTOMY    . TOTAL KNEE ARTHROPLASTY Right 08/17/2015   Procedure: RIGHT TOTAL KNEE ARTHROPLASTY;  Surgeon: Gaynelle Arabian, MD;  Location: [facility];  Service: Orthopedics;  Laterality: Right;  . TUBAL LIGATION     Family History  Problem Relation Age of Onset  . Hypertension Mother   . Stroke Mother   . Liver disease Father        alcohol related  . Bone cancer Paternal Grandfather   . Cancer Neg Hx   . Diabetes Neg Hx   . Colon cancer Neg Hx   . Esophageal cancer Neg Hx   . Pancreatic cancer Neg Hx   . Stomach cancer Neg Hx    Allergies  Allergen Reactions  . Augmentin [Amoxicillin-Pot Clavulanate] Diarrhea    Severe diarrhea  . Demerol [Meperidine] Nausea And Vomiting  . Latex Rash and Other (See Comments)    Reaction:  Blisters   . Other Itching    Nylon sheets, pt prefers cotton sheets   . Tramadol Nausea And Vomiting   Outpatient Encounter Medications as of 11/21/2019  Medication Sig  . Calcium Carbonate-Vitamin D (CALCIUM 600+D PO) Take 1 tablet by mouth 2 (two) times daily.  . Cholecalciferol (VITAMIN D) 2000 units tablet Take 2,000 Units by mouth daily.  . fenofibrate 160 MG tablet Take 1 tablet (160 mg total) by mouth daily.  . fluticasone (  FLONASE) 50 MCG/ACT nasal spray USE 1 SPRAY IN BOTH  NOSTRILS DAILY (Patient taking differently: Place 1 spray into both nostrils daily. )  . loteprednol (LOTEMAX) 0.5 % ophthalmic suspension Place 1 drop into both eyes in the morning, at noon, in the evening, and at bedtime.  . Melatonin 5 MG TABS Take 5 mg by mouth at bedtime.  . Multiple Vitamin (MULTIVITAMIN WITH MINERALS) TABS tablet Take 1 tablet by mouth daily.  . pantoprazole (PROTONIX) 40 MG tablet Take 1 tablet (40 mg total) by mouth 2 (two) times daily.  . polyethylene  glycol (MIRALAX / GLYCOLAX) packet Take 17 g by mouth daily.  . RESTASIS 0.05 % ophthalmic emulsion Place 1 drop into both eyes 2 (two) times daily.   No facility-administered encounter medications on file as of 11/21/2019.   Patient Care Team    Relationship Specialty Notifications Start End  [name], MD PCP - General   02/10/10   Lorretta Harp, MD PCP - Cardiology Cardiology  10/29/18   [name], MD Consulting Physician Internal Medicine  03/13/15   Festus Aloe, MD Consulting Physician Urology  03/13/15   Gaynelle Arabian, MD Consulting Physician Orthopedic Surgery  05/19/16   Harriett Sine, MD Consulting Physician Dermatology  05/19/16   Mauri Pole, MD Consulting Physician Gastroenterology  09/07/16   Hortencia Pilar, MD Consulting Physician Surgery  11/28/17    Comment: Opthalmology   Netta Cedars, MD Consulting Physician Orthopedic Surgery  11/28/17   Wylene Simmer, MD Consulting Physician Orthopedic Surgery  11/28/17    Comment: foot  Madelin Rear, [facility] Pharmacist Pharmacist  11/13/19    Comment: [phone]   Current Diagnosis/Assessment: Goals Addressed            This Visit's Progress   . PharmD Care Plan       CARE PLAN ENTRY (see longitudinal plan of care for additional care plan information)  Current Barriers:  . Chronic Disease Management support, education, and care coordination needs related to Anxiety and Osteoporosis   Osteoporosis . Pharmacist Clinical Goal(s) o Over the next 180 days, patient will work with PharmD and providers to prevent falls/fractures . Current regimen:  o Prolia injection every six months . Interventions: o Review calcium/vitamin D3 supplementation o Reviewed coverage options for Prolia and discussing plans with Medicare counselor [facility]): if interested their number is [phone] . Patient self care activities - Over the next 180 days, patient will: o Continue daily walks o Ensure adequate  supplementation with calcium (1200 mg/day in divided doses) and vitamin D3 (1000 units per day)   Depression/anxiety . Pharmacist Clinical Goal(s) o Over the next 365 days, patient will work with PharmD and providers to minimize anxiety related symptoms . Current regimen:  o No current medications . Interventions: o Discussed potential medication options and how they differ from Cymbalta (duloxetine). One example we discussed was sertraline (Zoloft), which can be started at a very low dose to minimize potential for side effects . Patient self care activities - Over the next 365 days, patient will: o Reach out to PCP or pharmacist if wanting to explore alternative treatments  Medication management . Pharmacist Clinical Goal(s): o Over the next 180 days, patient will work with PharmD and providers to maintain optimal medication adherence . [facility] . Interventions o Comprehensive medication review performed. o Continue current medication management strategy . Patient self care activities - Over the next 180 days, patient will: o Take medications as prescribed o  Report any questions or concerns to PharmD and/or provider(s) Initial goal documentation.      TG      Component Value Date/Time   CHOL 190 03/12/2019 0946   TRIG 131.0 03/12/2019 0946   HDL 61.60 03/12/2019 0946   LDLCALC 102 (H) 03/12/2019 0946   LDLDIRECT 69.8 01/16/2012 1628    Hepatic Function Latest Ref Rng & Units 03/12/2019 08/10/2018 11/28/2017  Total Protein 6.0 - 8.3 g/dL 6.6 7.0 6.7  Albumin 3.5 - 5.2 g/dL 4.1 4.7 4.3  AST 0 - 37 U/L _0 ALT 0 - 35 U/L _1 Alk Phosphatase 39 - 117 U/L 36(L) 36(L) 35(L)  Total Bilirubin 0.2 - 1.2 mg/dL 0.5 0.5 0.7  Bilirubin, Direct 0.0 - 0.3 mg/dL 0.1 0.1 0.2    The 10-year ASCVD risk score Mikey Bussing DC Jr., et al., 2013) is: 11.1%   Values used to calculate the score:     Age: 69 years     Sex: Female     Is Non-Hispanic African  American: No     Diabetic: No     Tobacco smoker: No     Systolic Blood Pressure: 956 mmHg     Is BP treated: No     HDL Cholesterol: 61.6 mg/dL     Total Cholesterol: 190 mg/dL   Patient is currently controlled on the following medications:  . Fenofibrate 160 mg once daily   We discussed:  diet and exercise extensively.  Plan  Continue current medications and control with diet and exercise.  GERD   Patient denies recent acid reflux.  Currently controlled on: . Pantoprazole 40 mg twice daily   Plan   Continue current medications.  Osteopenia / Osteoporosis   VITD  Date Value Ref Range Status  03/12/2019 50.31 30.00 - 100.00 ng/mL Final   Last DEXA Scan: 2019, osteoporosis. Multiple falls. Last prolia injection 09/13/2019. Reviewed opportunities to discuss  Patient is currently controlled on the following medications:  . Prolia inj every 6 months   We discussed:  Recommend [phone] units of vitamin D daily. Recommend 1200 mg of calcium daily from dietary and supplemental sources. Recommend weight-bearing and muscle strengthening exercises for building and maintaining bone density.  Plan  Continue current medications and control with diet and exercise.  Depression/Anxiety   PHQ9 SCORE ONLY 11/21/2019 07/15/2019 03/06/2019  PHQ-9 Total Score 0 0 0   Patient has failed these meds in past: cymbalta (states hallucinations).  We discussed:  Counseled on possible alternative tx, such as SSRIs. Has some interest, but not wanting to try at this time.    Plan  Continue current medications and control with diet and exercise.  Vaccines   Immunization History  Administered Date(s) Administered  . Influenza Whole 11/28/2012  . Influenza, High Dose Seasonal PF 11/10/2014, 10/26/2015, 11/09/2016, 10/31/2018, 11/20/2019  . Influenza,inj,Quad PF,6+ Mos 12/09/2013, 10/30/2017  . Influenza-Unspecified 10/31/2018  . PFIZER SARS-COV-2 Vaccination 03/22/2019, 04/12/2019  .  Pneumococcal Conjugate-13 09/08/2014  . Pneumococcal Polysaccharide-23 07/16/2013  . Tdap 06/28/2012   Reviewed and discussed patient's vaccination history. Annual flu vaccine received. Due for Shingrix. Would be due for COVID booster.  Plan  Recommended patient receive covid booster per cdc recommendation.  Pt to receive shingrix 2 weeks after covid booster.   Medication Management / Care Coordination   Receives prescription medications from:  [facility] #[id number] - [place], [place] [address] AT [address]  [place] [address] Phone: [phone] Fax: [phone]  [facility] [place], [address], [address] [place] [address] Phone: [phone] Fax: [phone]   Cost concern with prolia, restasis - discussed contacting [facility], not interested at this time in switching to or exploring other plans.   Discussed PAP requirements for restasis, agrees to bring with her to upcoming appointment with eye doctor. Will once application is prefilled and then send to her.   Other than cost, denies any issues with current medication management.   Discussed f/u with ortho, not wanting to do any surgery at this time.   Plan  Continue current medication management strategy. ___________________________ SDOH (Social Determinants of Health) assessments performed: Yes.  Future Appointments  Date Time Provider [place] [facility]  03/06/2020  9:00 AM [name], MD [facility]  05/15/2020 11:00 AM [facility] PHARMACIST [facility]   Visit follow-up:  . CPA follow-up: Restasis PAP - prefill application, send to patient,. . [place] follow-up: 6 month telephone visit. Review cost concerns, SSRIs for anxiety  Madelin Rear, Pharm.D., BCGP Clinical Pharmacist [facility] [phone]

## 2019-11-21 NOTE — Progress Notes (Addendum)
    Chronic Care Management Pharmacy Assistant   Name: Tracy [name]  MRN: [id number] DOB: [birth year]  Reason for Encounter: Patient Assistance Application   PCP : [name], MD  Allergies:   Allergies  Allergen Reactions   Augmentin [Amoxicillin-Pot Clavulanate] Diarrhea    Severe diarrhea   Demerol [Meperidine] Nausea And Vomiting   Latex Rash and Other (See Comments)    Reaction:  Blisters    Other Itching    Nylon sheets, pt prefers cotton sheets    Tramadol Nausea And Vomiting    Medications: Outpatient Encounter Medications as of 11/21/2019  Medication Sig   Calcium Carbonate-Vitamin D (CALCIUM 600+D PO) Take 1 tablet by mouth 2 (two) times daily.   Cholecalciferol (VITAMIN D) 2000 units tablet Take 2,000 Units by mouth daily.   fenofibrate 160 MG tablet Take 1 tablet (160 mg total) by mouth daily.   fluticasone (FLONASE) 50 MCG/ACT nasal spray USE 1 SPRAY IN BOTH  NOSTRILS DAILY (Patient taking differently: Place 1 spray into both nostrils daily. )   loteprednol (LOTEMAX) 0.5 % ophthalmic suspension Place 1 drop into both eyes in the morning, at noon, in the evening, and at bedtime.   Melatonin 5 MG TABS Take 5 mg by mouth at bedtime.   Multiple Vitamin (MULTIVITAMIN WITH MINERALS) TABS tablet Take 1 tablet by mouth daily.   pantoprazole (PROTONIX) 40 MG tablet Take 1 tablet (40 mg total) by mouth 2 (two) times daily.   polyethylene glycol (MIRALAX / GLYCOLAX) packet Take 17 g by mouth daily.   RESTASIS 0.05 % ophthalmic emulsion Place 1 drop into both eyes 2 (two) times daily.   No facility-administered encounter medications on file as of 11/21/2019.    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   Shoulder arthritis 03/01/2017   Perforation of sigmoid colon due to diverticulitis 07/05/2016   OA  (osteoarthritis) of knee 08/17/2015   Thyroid nodule, 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   Subclinical hyperthyroidism 06/05/2014   Trochanteric bursitis of left hip 04/01/2014   Rosacea 03/26/2014   Allergic rhinitis 02/19/2014   Left hip pain 12/19/2013   Atrophic vaginitis 10/07/2013   Screening for malignant neoplasm of the cervix 01/12/2011   Physical exam 01/12/2011   HYPERTRIGLYCERIDEMIA 12/03/2009   GERD 12/03/2009   UTI'S, RECURRENT 12/03/2009   LEG CRAMPS, NOCTURNAL 12/03/2009   DIVERTICULITIS, HX OF 12/03/2009   Depression with anxiety 11/05/2009   Osteoporosis 11/05/2009     Patient assistance application for Restasis Completed and placed in patient documents folder for pharmacist review . Please print out med list and allergie list for patient. Left voice message regarding paperwork coming in the mall for patient to fill out and return to office once completed .   Georgiana Shore ,[place] Pharmacist Assistant [phone]      Follow-Up:  Pharmacist Review  Reviewed and sent to pt.

## 2019-11-23 ENCOUNTER — Encounter: Payer: Self-pay | Admitting: Family Medicine

## 2019-12-02 DIAGNOSIS — H35363 Drusen (degenerative) of macula, bilateral: Secondary | ICD-10-CM | POA: Diagnosis not present

## 2019-12-02 DIAGNOSIS — Z961 Presence of intraocular lens: Secondary | ICD-10-CM | POA: Diagnosis not present

## 2019-12-02 DIAGNOSIS — H26493 Other secondary cataract, bilateral: Secondary | ICD-10-CM | POA: Diagnosis not present

## 2019-12-02 DIAGNOSIS — H04123 Dry eye syndrome of bilateral lacrimal glands: Secondary | ICD-10-CM | POA: Diagnosis not present

## 2019-12-02 DIAGNOSIS — H35372 Puckering of macula, left eye: Secondary | ICD-10-CM | POA: Diagnosis not present

## 2019-12-05 ENCOUNTER — Encounter: Payer: Self-pay | Admitting: Family Medicine

## 2019-12-13 DIAGNOSIS — H04123 Dry eye syndrome of bilateral lacrimal glands: Secondary | ICD-10-CM | POA: Diagnosis not present

## 2019-12-13 DIAGNOSIS — H16223 Keratoconjunctivitis sicca, not specified as Sjogren's, bilateral: Secondary | ICD-10-CM | POA: Diagnosis not present

## 2019-12-13 DIAGNOSIS — H02882 Meibomian gland dysfunction right lower eyelid: Secondary | ICD-10-CM | POA: Diagnosis not present

## 2019-12-13 DIAGNOSIS — L718 Other rosacea: Secondary | ICD-10-CM | POA: Diagnosis not present

## 2019-12-13 DIAGNOSIS — H02885 Meibomian gland dysfunction left lower eyelid: Secondary | ICD-10-CM | POA: Diagnosis not present

## 2020-01-03 ENCOUNTER — Encounter: Payer: Self-pay | Admitting: Family Medicine

## 2020-01-20 ENCOUNTER — Encounter: Payer: Self-pay | Admitting: Family Medicine

## 2020-01-20 ENCOUNTER — Other Ambulatory Visit: Payer: Self-pay

## 2020-01-20 MED ORDER — PANTOPRAZOLE SODIUM 40 MG PO TBEC: 40.0000 mg | DELAYED_RELEASE_TABLET | Freq: Two times a day (BID) | ORAL | 1 refills | Status: DC

## 2020-02-18 ENCOUNTER — Telehealth: Payer: Self-pay | Admitting: Family Medicine

## 2020-02-18 NOTE — Telephone Encounter (Signed)
Left message for patient to schedule Annual Wellness Visit.  Please schedule with Nurse Health Advisor Martha Stanley, RN at [facility]  

## 2020-02-25 ENCOUNTER — Other Ambulatory Visit: Payer: Self-pay | Admitting: Family Medicine

## 2020-03-03 ENCOUNTER — Encounter: Payer: Self-pay | Admitting: Family Medicine

## 2020-03-05 NOTE — Progress Notes (Signed)
Subjective:   Tracy [name] is a 70 y.o. female who presents for Medicare Annual (Subsequent) preventive examination.  I connected with Tracy [name] today by telephone and verified that I am speaking with the correct person using two identifiers. Location patient: home Location provider: work Persons participating in the virtual visit: patient, Engineer, civil (consulting).    I discussed the limitations, risks, security and privacy concerns of performing an evaluation and management service by telephone and the availability of in person appointments. I also discussed with the patient that there may be a patient responsible charge related to this service. The patient expressed understanding and verbally consented to this telephonic visit.    Interactive audio and video telecommunications were attempted between this provider and patient, however failed, due to patient having technical difficulties OR patient did not have access to video capability.  We continued and completed visit with audio only.  Some vital signs may be absent or patient reported.   Time Spent with patient on telephone encounter: 25 minutes   Review of Systems     Cardiac Risk Factors include: advanced age (>72men, >57 women);dyslipidemia     Objective:    Today's Vitals   03/09/20 0900  Weight: 153 lb (69.4 kg)  Height: 5\' 3"  (1.6 m)   Body mass index is 27.1 kg/m.  Advanced Directives 03/09/2020 07/12/2019 11/28/2017 06/23/2017 06/23/2017 06/19/2017 01/05/2017  Does Patient Have a Medical Advance Directive? Yes No Yes Yes Yes Yes Yes  Type of 13/09/2016 of [place];Living will - Healthcare Power of [place];Living will Living will Living will Living will Healthcare Power of [place];Living will  Does patient want to make changes to medical advance directive? - - - No - Patient declined No - Patient declined No - Patient declined -  Copy of Healthcare Power of Attorney in Chart? Yes - validated most recent copy  scanned in chart (See row information) - Yes - - - -  Would patient like information on creating a medical advance directive? - No - Patient declined - - - - -    Current Medications (verified) Outpatient Encounter Medications as of 03/09/2020  Medication Sig  . Calcium Carbonate-Vitamin D (CALCIUM 600+D PO) Take 1 tablet by mouth 2 (two) times daily.  . Cholecalciferol (VITAMIN D) 2000 units tablet Take 2,000 Units by mouth daily.  . fenofibrate 160 MG tablet TAKE 1 TABLET BY MOUTH  DAILY  . FLUoxetine (PROZAC) 10 MG capsule Take 1 capsule (10 mg total) by mouth daily.  . fluticasone (FLONASE) 50 MCG/ACT nasal spray USE 1 SPRAY IN BOTH  NOSTRILS DAILY (Patient taking differently: Place 1 spray into both nostrils daily.)  . loteprednol (LOTEMAX) 0.5 % ophthalmic suspension Place 1 drop into both eyes in the morning, at noon, in the evening, and at bedtime.  . Melatonin 5 MG TABS Take 5 mg by mouth at bedtime.  . Multiple Vitamin (MULTIVITAMIN WITH MINERALS) TABS tablet Take 1 tablet by mouth daily.  . pantoprazole (PROTONIX) 40 MG tablet Take 1 tablet (40 mg total) by mouth 2 (two) times daily.  . polyethylene glycol (MIRALAX / GLYCOLAX) packet Take 17 g by mouth daily.  . RESTASIS 0.05 % ophthalmic emulsion Place 1 drop into both eyes 2 (two) times daily.   No facility-administered encounter medications on file as of 03/09/2020.    Allergies (verified) Augmentin [amoxicillin-pot clavulanate], Demerol [meperidine], Latex, Other, and Tramadol   History: Past Medical History:  Diagnosis Date  . Allergy   . Anxiety   .  Arthritis   . Bursitis of left hip   . C. difficile diarrhea   . Cancer (HCC)    basil cell  . Cataract    bilateral  . Complication of anesthesia   . Diverticulitis   . GERD (gastroesophageal reflux disease)   . Hx: UTI (urinary tract infection)   . Hyperlipidemia   . Hyperthyroidism   . Osteopenia   . Osteoporosis   . PONV (postoperative nausea and vomiting)    . Rosacea   . Rotator cuff arthropathy, left    Past Surgical History:  Procedure Laterality Date  . APPENDECTOMY    . BACK SURGERY    . CATARACT EXTRACTION, BILATERAL  11/2018  . CHOLECYSTECTOMY    . FOOT SURGERY Right    X 2  . KNEE SURGERY    . LAPAROSCOPIC LIVER CYST FENESTRATION    . OPEN SURGICAL REPAIR OF GLUTEAL TENDON Left 04/01/2014   Procedure: LEFT HIP BURSECTOMY WITH GLUTEAL TENDON REPAIR;  Surgeon: Gearlean Alf, MD;  Location: [facility];  Service: Orthopedics;  Laterality: Left;  . REVERSE SHOULDER ARTHROPLASTY Right 06/23/2017   Procedure: RIGHT REVERSE SHOULDER ARTHROPLASTY;  Surgeon: Netta Cedars, MD;  Location: [facility];  Service: Orthopedics;  Laterality: Right;  . TONSILLECTOMY    . TOTAL KNEE ARTHROPLASTY Right 08/17/2015   Procedure: RIGHT TOTAL KNEE ARTHROPLASTY;  Surgeon: Gaynelle Arabian, MD;  Location: [facility];  Service: Orthopedics;  Laterality: Right;  . TUBAL LIGATION     Family History  Problem Relation Age of Onset  . Hypertension Mother   . Stroke Mother   . Liver disease Father        alcohol related  . Bone cancer Paternal Grandfather   . Cancer Neg Hx   . Diabetes Neg Hx   . Colon cancer Neg Hx   . Esophageal cancer Neg Hx   . Pancreatic cancer Neg Hx   . Stomach cancer Neg Hx    Social History   Socioeconomic History  . Marital status: Married    Spouse name: Not on file  . Number of children: Not on file  . Years of education: Not on file  . Highest education level: Not on file  Occupational History  . Not on file  Tobacco Use  . Smoking status: Never Smoker  . Smokeless tobacco: Never Used  Vaping Use  . Vaping Use: Never used  Substance and Sexual Activity  . Alcohol use: No  . Drug use: No  . Sexual activity: Not Currently  Other Topics Concern  . Not on file  Social History Narrative  . Not on file   Social Determinants of Health   Financial Resource Strain: Low Risk   . Difficulty of Paying Living Expenses: Not hard at  all  Food Insecurity: No Food Insecurity  . Worried About Charity fundraiser in the Last Year: Never true  . Ran Out of Food in the Last Year: Never true  Transportation Needs: No Transportation Needs  . Lack of Transportation (Medical): No  . Lack of Transportation (Non-Medical): No  Physical Activity: Sufficiently Active  . Days of Exercise per Week: 7 days  . Minutes of Exercise per Session: 30 min  Stress: Stress Concern Present  . Feeling of Stress : To some extent  Social Connections: Moderately Integrated  . Frequency of Communication with Friends and Family: More than three times a week  . Frequency of Social Gatherings with Friends and Family: Once a week  .  Attends Religious Services: More than 4 times per year  . Active Member of Clubs or Organizations: No  . Attends Banker Meetings: Never  . Marital Status: Married    Tobacco Counseling Counseling given: Not Answered   Clinical Intake:  Pre-visit preparation completed: Yes  Pain : No/denies pain     Nutritional Status: BMI 25 -29 Overweight Nutritional Risks: None Diabetes: No  How often do you need to have someone help you when you read instructions, pamphlets, or other written materials from your doctor or pharmacy?: 1 - Never  Diabetic?No  Interpreter Needed?: No  Information entered by :: Thomasenia Sales LPN   Activities of Daily Living In your present state of health, do you have any difficulty performing the following activities: 03/09/2020 03/06/2020  Hearing? N Y  Vision? N N  Difficulty concentrating or making decisions? N N  Walking or climbing stairs? N N  Dressing or bathing? N N  Doing errands, shopping? N N  Preparing Food and eating ? N -  Using the Toilet? N -  In the past six months, have you accidently leaked urine? N -  Do you have problems with loss of bowel control? N -  Managing your Medications? N -  Managing your Finances? N -  Housekeeping or managing your  Housekeeping? N -  Some recent data might be hidden    Patient Care Team: Sheliah Hatch, MD as PCP - General Allyson Sabal Delton See, MD as PCP - Cardiology (Cardiology) Carlus Pavlov, MD as Consulting Physician (Internal Medicine) Jerilee Field, MD as Consulting Physician (Urology) Ollen Gross, MD as Consulting Physician (Orthopedic Surgery) Aris Lot, MD as Consulting Physician (Dermatology) Napoleon Form, MD as Consulting Physician (Gastroenterology) Dimitri Ped, MD as Consulting Physician (Surgery) Beverely Low, MD as Consulting Physician (Orthopedic Surgery) Toni Arthurs, MD as Consulting Physician (Orthopedic Surgery) Dahlia Byes, [facility] as Pharmacist (Pharmacist)  Indicate any recent Medical Services you may have received from other than [facility] providers in the past year (date may be approximate).     Assessment:   This is a routine wellness examination for [facility].  Hearing/Vision screen  Hearing Screening   125Hz  250Hz  500Hz  1000Hz  2000Hz  3000Hz  4000Hz  6000Hz  8000Hz   Right ear:           Left ear:           Comments: No hearing loss  Vision Screening Comments: Last eye exam-2021 Dr. & Dr. )  Dietary issues and exercise activities discussed: Current Exercise Habits: Home exercise routine, Type of exercise: walking, Time (Minutes): 30, Frequency (Times/Week): 7, Weekly Exercise (Minutes/Week): 210, Intensity: Mild, Exercise limited by: None identified  Goals    . Increase physical activity     Increase activity by joining gym/water aerobics.     . Patient Stated     Continue eating healthy & drinking lots of water    . PharmD Care Plan     CARE PLAN ENTRY (see longitudinal plan of care for additional care plan information)  Current Barriers:  . Chronic Disease Management support, education, and care coordination needs related to Anxiety and Osteoporosis   Osteoporosis . Pharmacist Clinical Goal(s) o Over the  next 180 days, patient will work with PharmD and providers to prevent falls/fractures . Current regimen:  o Prolia injection every six months . Interventions: o Review calcium/vitamin D3 supplementation o Reviewed coverage options for Prolia and discussing plans with Medicare counselor [facility]): if interested their number is [phone] . Patient  self care activities - Over the next 180 days, patient will: o Continue daily walks o Ensure adequate supplementation with calcium (1200 mg/day in divided doses) and vitamin D3 (1000 units per day)   Depression/anxiety . Pharmacist Clinical Goal(s) o Over the next 365 days, patient will work with PharmD and providers to minimize anxiety related symptoms . Current regimen:  o No current medications . Interventions: o Discussed potential medication options and how they differ from Cymbalta (duloxetine). One example we discussed was sertraline (Zoloft), which can be started at a very low dose to minimize potential for side effects . Patient self care activities - Over the next 365 days, patient will: o Reach out to PCP or pharmacist if wanting to explore alternative treatments  Medication management . Pharmacist Clinical Goal(s): o Over the next 180 days, patient will work with PharmD and providers to maintain optimal medication adherence . Current pharmacy: [facility] . Interventions o Comprehensive medication review performed. o Continue current medication management strategy . Patient self care activities - Over the next 180 days, patient will: o Take medications as prescribed o Report any questions or concerns to PharmD and/or provider(s) Initial goal documentation.      Depression Screen PHQ 2/9 Scores 03/09/2020 03/06/2020 11/21/2019 07/15/2019 03/06/2019 08/10/2018 11/28/2017  PHQ - 2 Score 1 4 0 0 0 0 0  PHQ- 9 Score - 14 - - 0 - -  Exception Documentation - - - - - - -    Fall Risk Fall Risk  03/09/2020 03/06/2020 07/15/2019  03/06/2019 03/06/2019  Falls in the past year? 1 1 1 1 1   Comment - 01/29/2020 - - -  Number falls in past yr: 1 0 1 1 1   Injury with Fall? 1 0 1 0 0  Risk for fall due to : History of fall(s);Impaired balance/gait - - - Impaired vision  Follow up Falls prevention discussed - Falls evaluation completed Falls evaluation completed Falls evaluation completed    FALL RISK PREVENTION PERTAINING TO THE HOME:  Any stairs in or around the home? Yes  If so, are there any without handrails? No  Home free of loose throw rugs in walkways, pet beds, electrical cords, etc? Yes  Adequate lighting in your home to reduce risk of falls? Yes   ASSISTIVE DEVICES UTILIZED TO PREVENT FALLS:  Life alert? No  Use of a cane, walker or w/c? No  Grab bars in the bathroom? Yes  Shower chair or bench in shower? No  Elevated toilet seat or a handicapped toilet? No   TIMED UP AND GO:  Was the test performed? No . Phone visit   Cognitive Function:Normal cognitive status assessed by  this Nurse Health Advisor. No abnormalities found.   MMSE - Mini Mental State Exam 11/28/2017  Orientation to time 5  Orientation to Place 5  Registration 3  Attention/ Calculation 5  Recall 3  Language- name 2 objects 2  Language- repeat 1  Language- follow 3 step command 3  Language- read & follow direction 1  Write a sentence 1  Copy design 1  Total score 30     6CIT Screen 03/09/2020  What Year? 0 points  What month? 0 points  What time? 0 points  Count back from 20 0 points  Months in reverse 0 points  Repeat phrase 0 points  Total Score 0    Immunizations Immunization History  Administered Date(s) Administered  . Influenza Whole 11/28/2012  . Influenza, High Dose Seasonal PF 11/10/2014,  10/26/2015, 11/09/2016, 10/31/2018, 11/20/2019  . Influenza,inj,Quad PF,6+ Mos 12/09/2013, 10/30/2017  . Influenza-Unspecified 10/31/2018  . PFIZER SARS-COV-2 Vaccination 03/22/2019, 04/12/2019, 12/04/2019  . Pneumococcal  Conjugate-13 09/08/2014  . Pneumococcal Polysaccharide-23 07/16/2013  . Tdap 06/28/2012    TDAP status: Up to date  Flu Vaccine status: Up to date  Pneumococcal vaccine status: Up to date  Covid-19 vaccine status: Completed vaccines  Qualifies for Shingles Vaccine? Yes   Zostavax completed No   Shingrix Completed?: No.    Education has been provided regarding the importance of this vaccine. Patient has been advised to call insurance company to determine out of pocket expense if they have not yet received this vaccine. Advised may also receive vaccine at local pharmacy or Health Dept. Verbalized acceptance and understanding.  Screening Tests Health Maintenance  Topic Date Due  . MAMMOGRAM  10/22/2020  . TETANUS/TDAP  06/29/2022  . COLONOSCOPY (Pts 45-21yrs Insurance coverage will need to be confirmed)  01/06/2027  . INFLUENZA VACCINE  Completed  . DEXA SCAN  Completed  . COVID-19 Vaccine  Completed  . Hepatitis C Screening  Completed  . PNA vac Low Risk Adult  Completed    Health Maintenance  There are no preventive care reminders to display for this patient.  Colorectal cancer screening: Type of screening: Colonoscopy. Completed 01/05/2017. Repeat every 10 years  Mammogram status: Completed Bilateral-10/23/19. Repeat every year  Bone Density status: Due-Patient declined today.  Lung Cancer Screening: (Low Dose CT Chest recommended if Age 29-80 years, 30 pack-year currently smoking OR have quit w/in 15years.) does not qualify.     Additional Screening:  Hepatitis C Screening:Completed 03/13/2015  Vision Screening: Recommended annual ophthalmology exams for early detection of glaucoma and other disorders of the eye. Is the patient up to date with their annual eye exam?  Yes  Who is the provider or what is the name of the office in which the patient attends annual eye exams? Dr. Alben Spittle   Dental Screening: Recommended annual dental exams for proper oral  hygiene  Community Resource Referral / Chronic Care Management: CRR required this visit?  No   CCM required this visit?  No      Plan:     I have personally reviewed and noted the following in the patient's chart:   . Medical and social history . Use of alcohol, tobacco or illicit drugs  . Current medications and supplements . Functional ability and status . Nutritional status . Physical activity . Advanced directives . List of other physicians . Hospitalizations, surgeries, and ER visits in previous 12 months . Vitals . Screenings to include cognitive, depression, and falls . Referrals and appointments  In addition, I have reviewed and discussed with patient certain preventive protocols, quality metrics, and best practice recommendations. A written personalized care plan for preventive services as well as general preventive health recommendations were provided to patient.   Due to this being a telephonic visit, the after visit summary with patients personalized plan was offered to patient via mail or my-chart.  Patient would like to access on my-chart.   Roanna Raider, LPN   04/13/863  Nurse Health Advisor  Nurse Notes: None

## 2020-03-06 ENCOUNTER — Other Ambulatory Visit: Payer: Self-pay

## 2020-03-06 ENCOUNTER — Encounter: Payer: Self-pay | Admitting: Family Medicine

## 2020-03-06 ENCOUNTER — Ambulatory Visit (INDEPENDENT_AMBULATORY_CARE_PROVIDER_SITE_OTHER): Payer: Medicare Other | Admitting: Family Medicine

## 2020-03-06 VITALS — BP 128/80 | HR 62 | Temp 97.7°F | Resp 16 | Ht 63.0 in | Wt 153.0 lb

## 2020-03-06 DIAGNOSIS — M810 Age-related osteoporosis without current pathological fracture: Secondary | ICD-10-CM | POA: Diagnosis not present

## 2020-03-06 DIAGNOSIS — E0590 Thyrotoxicosis, unspecified without thyrotoxic crisis or storm: Secondary | ICD-10-CM | POA: Diagnosis not present

## 2020-03-06 DIAGNOSIS — F418 Other specified anxiety disorders: Secondary | ICD-10-CM

## 2020-03-06 DIAGNOSIS — R7989 Other specified abnormal findings of blood chemistry: Secondary | ICD-10-CM | POA: Diagnosis not present

## 2020-03-06 DIAGNOSIS — Z0000 Encounter for general adult medical examination without abnormal findings: Secondary | ICD-10-CM | POA: Diagnosis not present

## 2020-03-06 DIAGNOSIS — E663 Overweight: Secondary | ICD-10-CM

## 2020-03-06 MED ORDER — DENOSUMAB 60 MG/ML ~~LOC~~ SOSY
60.0000 mg | PREFILLED_SYRINGE | Freq: Once | SUBCUTANEOUS | Status: AC
  Administered 2020-03-06: 60 mg via SUBCUTANEOUS

## 2020-03-06 MED ORDER — FLUOXETINE HCL 10 MG PO CAPS: 10.0000 mg | ORAL_CAPSULE | Freq: Every day | ORAL | 3 refills | Status: DC

## 2020-03-06 NOTE — Patient Instructions (Addendum)
Follow up in 1 month to recheck anxiety/depression We'll notify you of your lab results and make any changes if needed Continue to work on healthy diet and regular exercise- you look great! Start the Fluoxetine 10mg  daily for mood Call with any questions or concerns Stay Safe!  Stay Healthy! Hang in there!!!

## 2020-03-06 NOTE — Assessment & Plan Note (Signed)
Chronic problem, on Prolia.  Injxn given today.  Check Vit D and replete prn.

## 2020-03-06 NOTE — Progress Notes (Signed)
Subjective:    Patient ID: Tracy [name], female    DOB: 1950/12/13, 70 y.o.   MRN: [id number]  HPI CPE- UTD on mammo, colonoscopy, immunizations  Reviewed past medical, surgical, family and social histories.   Patient Care Team    Relationship Specialty Notifications Start End  [name], MD PCP - General   02/10/10   Lorretta Harp, MD PCP - Cardiology Cardiology  10/29/18   [name], MD Consulting Physician Internal Medicine  03/13/15   Festus Aloe, MD Consulting Physician Urology  03/13/15   Gaynelle Arabian, MD Consulting Physician Orthopedic Surgery  05/19/16   Harriett Sine, MD Consulting Physician Dermatology  05/19/16   Mauri Pole, MD Consulting Physician Gastroenterology  09/07/16   Hortencia Pilar, MD Consulting Physician Surgery  11/28/17    Comment: Opthalmology   Netta Cedars, MD Consulting Physician Orthopedic Surgery  11/28/17   Wylene Simmer, MD Consulting Physician Orthopedic Surgery  11/28/17    Comment: foot  Madelin Rear, [facility] Pharmacist Pharmacist  11/13/19    Comment: [phone]    Health Maintenance  Topic Date Due  . MAMMOGRAM  10/22/2020  . TETANUS/TDAP  06/29/2022  . COLONOSCOPY (Pts 45-61yrs Insurance coverage will need to be confirmed)  01/06/2027  . INFLUENZA VACCINE  Completed  . DEXA SCAN  Completed  . COVID-19 Vaccine  Completed  . Hepatitis C Screening  Completed  . PNA vac Low Risk Adult  Completed      Review of Systems Patient reports no adenopathy,fever, persistant/recurrent hoarseness , swallowing issues, chest pain, palpitations, edema, persistant/recurrent cough, hemoptysis, dyspnea (rest/exertional/paroxysmal nocturnal), gastrointestinal bleeding (melena, rectal bleeding), abdominal pain, significant heartburn, bowel changes, GU symptoms (dysuria, hematuria, incontinence), Gyn symptoms (abnormal  bleeding, pain),  syncope, focal weakness, memory loss, numbness & tingling, skin/hair changes,  abnormal bruising or bleeding  + depression- pt has had this previously but is not currently on meds.  'we don't go anywhere'.  She and husband are 'arguing more than ever'.  Pt is concerned about short term memory- 'i'll have another thought and I get distracted and then I can't remember what I was going to do'.  Difficulty sleeping.  Pt reports 'feeling overwhelmed most days'.  'i'm retired now, but we don't go anywhere or do anything'. + 15 lb weight loss- 'i've been working on it'.  Walking daily + visual changes- going to [employer] + brittle nails- pt is picking nails/cuticles due to anxiety  This visit occurred during the SARS-CoV-2 public health emergency.  Safety protocols were in place, including screening questions prior to the visit, additional usage of staff PPE, and extensive cleaning of exam room while observing appropriate contact time as indicated for disinfecting solutions.       Objective:   Physical Exam General Appearance:    Alert, cooperative, no distress, appears stated age  Head:    Normocephalic, without obvious abnormality, atraumatic  Eyes:    PERRL, conjunctiva/corneas clear, EOM's intact, fundi    benign, both eyes  Ears:    Normal TM's and external ear canals, both ears  Nose:   Nares normal, septum midline, mucosa normal, no drainage    or sinus tenderness  Throat:   Lips, mucosa, and tongue normal; teeth and gums normal  Neck:   Supple, symmetrical, trachea midline, no adenopathy;    Thyroid: no enlargement/tenderness/nodules  Back:     Symmetric, no curvature, ROM normal, no CVA tenderness  Lungs:     Clear to auscultation bilaterally,  respirations unlabored  Chest Wall:    No tenderness or deformity   Heart:    Regular rate and rhythm, S1 and S2 normal, no murmur, rub   or gallop  Breast Exam:    Deferred to GYN  Abdomen:     Soft, non-tender, bowel sounds active all four quadrants,    no masses, no organomegaly  Genitalia:    Deferred to GYN  Rectal:     Extremities:   Extremities normal, atraumatic, no cyanosis or edema  Pulses:   2+ and symmetric all extremities  Skin:   Skin color, texture, turgor normal, no rashes or lesions  Lymph nodes:   Cervical, supraclavicular, and axillary nodes normal  Neurologic:   CNII-XII intact, normal strength, sensation and reflexes    throughout          Assessment & Plan:

## 2020-03-06 NOTE — Addendum Note (Signed)
Addended by: Fritz Pickerel on: 03/06/2020 11:40 AM   Modules accepted: Orders

## 2020-03-06 NOTE — Assessment & Plan Note (Signed)
Pt is down nearly 15 lbs since last visit.  Applauded her efforts.  Check labs to risk stratify.  Will follow.

## 2020-03-06 NOTE — Assessment & Plan Note (Signed)
Pt's PE WNL.  UTD on mammo, colonoscopy, immunizations.  Check labs.  Anticipatory guidance provided.  

## 2020-03-06 NOTE — Assessment & Plan Note (Signed)
Deteriorated.  Pt's sxs are severe.  She is tearful in office, scored 14 on PHQ9.  Suspect this is where her lack of focus and memory issues are coming from.  Will start low dose Fluoxetine and monitor carefully for improvement.  Pt expressed understanding and is in agreement w/ plan.

## 2020-03-09 ENCOUNTER — Ambulatory Visit (INDEPENDENT_AMBULATORY_CARE_PROVIDER_SITE_OTHER): Payer: Medicare Other

## 2020-03-09 ENCOUNTER — Encounter: Payer: Self-pay | Admitting: Family Medicine

## 2020-03-09 VITALS — Ht 63.0 in | Wt 153.0 lb

## 2020-03-09 DIAGNOSIS — Z0000 Encounter for general adult medical examination without abnormal findings: Secondary | ICD-10-CM | POA: Diagnosis not present

## 2020-03-09 NOTE — Patient Instructions (Signed)
Tracy [name] , Thank you for taking time to complete your Medicare Wellness Visit. I appreciate your ongoing commitment to your health goals. Please review the following plan we discussed and let me know if I can assist you in the future.   Screening recommendations/referrals: Colonoscopy: Completed 01/05/2017-Due 01/06/2027 Mammogram: Completed 10/23/2019-Due 10/22/2020 Bone Density: Due-Declined today. Please call the office if you change your mind. Recommended yearly ophthalmology/optometry visit for glaucoma screening and checkup Recommended yearly dental visit for hygiene and checkup  Vaccinations: Influenza vaccine: Up to date Pneumococcal vaccine: Completed vaccines Tdap vaccine: Up to date-Due-06/29/2022 Shingles vaccine: Discuss with pharmacy  Covid-19:Completed vaccines  Advanced directives: Copy in chart  Conditions/risks identified: See problem list  Next appointment: Follow up in one year for your annual wellness visit    Preventive Care 65 Years and Older, Female Preventive care refers to lifestyle choices and visits with your health care provider that can promote health and wellness. What does preventive care include?  A yearly physical exam. This is also called an annual well check.  Dental exams once or twice a year.  Routine eye exams. Ask your health care provider how often you should have your eyes checked.  Personal lifestyle choices, including:  Daily care of your teeth and gums.  Regular physical activity.  Eating a healthy diet.  Avoiding tobacco and drug use.  Limiting alcohol use.  Practicing safe sex.  Taking low-dose aspirin every day.  Taking vitamin and mineral supplements as recommended by your health care provider. What happens during an annual well check? The services and screenings done by your health care provider during your annual well check will depend on your age, overall health, lifestyle risk factors, and family history of  disease. Counseling  Your health care provider may ask you questions about your:  Alcohol use.  Tobacco use.  Drug use.  Emotional well-being.  Home and relationship well-being.  Sexual activity.  Eating habits.  History of falls.  Memory and ability to understand (cognition).  Work and work Statistician.  Reproductive health. Screening  You may have the following tests or measurements:  Height, weight, and BMI.  Blood pressure.  Lipid and cholesterol levels. These may be checked every 5 years, or more frequently if you are over 69 years old.  Skin check.  Lung cancer screening. You may have this screening every year starting at age 37 if you have a 30-pack-year history of smoking and currently smoke or have quit within the past 15 years.  Fecal occult blood test (FOBT) of the stool. You may have this test every year starting at age 35.  Flexible sigmoidoscopy or colonoscopy. You may have a sigmoidoscopy every 5 years or a colonoscopy every 10 years starting at age 46.  Hepatitis C blood test.  Hepatitis B blood test.  Sexually transmitted disease (STD) testing.  Diabetes screening. This is done by checking your blood sugar (glucose) after you have not eaten for a while (fasting). You may have this done every 1-3 years.  Bone density scan. This is done to screen for osteoporosis. You may have this done starting at age 28.  Mammogram. This may be done every 1-2 years. Talk to your health care provider about how often you should have regular mammograms. Talk with your health care provider about your test results, treatment options, and if necessary, the need for more tests. Vaccines  Your health care provider may recommend certain vaccines, such as:  Influenza vaccine. This is recommended every year.  Tetanus, diphtheria, and acellular pertussis (Tdap, Td) vaccine. You may need a Td booster every 10 years.  Zoster vaccine. You may need this after age  4.  Pneumococcal 13-valent conjugate (PCV13) vaccine. One dose is recommended after age 106.  Pneumococcal polysaccharide (PPSV23) vaccine. One dose is recommended after age 30. Talk to your health care provider about which screenings and vaccines you need and how often you need them. This information is not intended to replace advice given to you by your health care provider. Make sure you discuss any questions you have with your health care provider. Document Released: 03/13/2015 Document Revised: 11/04/2015 Document Reviewed: 12/16/2014 Elsevier Interactive Patient Education  2017 [employer] Prevention in the Home Falls can cause injuries. They can happen to people of all ages. There are many things you can do to make your home safe and to help prevent falls. What can I do on the outside of my home?  Regularly fix the edges of walkways and driveways and fix any cracks.  Remove anything that might make you trip as you walk through a door, such as a raised step or threshold.  Trim any bushes or trees on the path to your home.  Use bright outdoor lighting.  Clear any walking paths of anything that might make someone trip, such as rocks or tools.  Regularly check to see if handrails are loose or broken. Make sure that both sides of any steps have handrails.  Any raised decks and porches should have guardrails on the edges.  Have any leaves, snow, or ice cleared regularly.  Use sand or salt on walking paths during winter.  Clean up any spills in your garage right away. This includes oil or grease spills. What can I do in the bathroom?  Use night lights.  Install grab bars by the toilet and in the tub and shower. Do not use towel bars as grab bars.  Use non-skid mats or decals in the tub or shower.  If you need to sit down in the shower, use a plastic, non-slip stool.  Keep the floor dry. Clean up any water that spills on the floor as soon as it happens.  Remove  soap buildup in the tub or shower regularly.  Attach bath mats securely with double-sided non-slip rug tape.  Do not have throw rugs and other things on the floor that can make you trip. What can I do in the bedroom?  Use night lights.  Make sure that you have a light by your bed that is easy to reach.  Do not use any sheets or blankets that are too big for your bed. They should not hang down onto the floor.  Have a firm chair that has side arms. You can use this for support while you get dressed.  Do not have throw rugs and other things on the floor that can make you trip. What can I do in the kitchen?  Clean up any spills right away.  Avoid walking on wet floors.  Keep items that you use a lot in easy-to-reach places.  If you need to reach something above you, use a strong step stool that has a grab bar.  Keep electrical cords out of the way.  Do not use floor polish or wax that makes floors slippery. If you must use wax, use non-skid floor wax.  Do not have throw rugs and other things on the floor that can make you trip. What can I do  with my stairs?  Do not leave any items on the stairs.  Make sure that there are handrails on both sides of the stairs and use them. Fix handrails that are broken or loose. Make sure that handrails are as long as the stairways.  Check any carpeting to make sure that it is firmly attached to the stairs. Fix any carpet that is loose or worn.  Avoid having throw rugs at the top or bottom of the stairs. If you do have throw rugs, attach them to the floor with carpet tape.  Make sure that you have a light switch at the top of the stairs and the bottom of the stairs. If you do not have them, ask someone to add them for you. What else can I do to help prevent falls?  Wear shoes that:  Do not have high heels.  Have rubber bottoms.  Are comfortable and fit you well.  Are closed at the toe. Do not wear sandals.  If you use a  stepladder:  Make sure that it is fully opened. Do not climb a closed stepladder.  Make sure that both sides of the stepladder are locked into place.  Ask someone to hold it for you, if possible.  Clearly mark and make sure that you can see:  Any grab bars or handrails.  First and last steps.  Where the edge of each step is.  Use tools that help you move around (mobility aids) if they are needed. These include:  Canes.  Walkers.  Scooters.  Crutches.  Turn on the lights when you go into a dark area. Replace any light bulbs as soon as they burn out.  Set up your furniture so you have a clear path. Avoid moving your furniture around.  If any of your floors are uneven, fix them.  If there are any pets around you, be aware of where they are.  Review your medicines with your doctor. Some medicines can make you feel dizzy. This can increase your chance of falling. Ask your doctor what other things that you can do to help prevent falls. This information is not intended to replace advice given to you by your health care provider. Make sure you discuss any questions you have with your health care provider. Document Released: 12/11/2008 Document Revised: 07/23/2015 Document Reviewed: 03/21/2014 Elsevier Interactive Patient Education  2017 [employer].

## 2020-03-12 LAB — BASIC METABOLIC PANEL
BUN/Creatinine Ratio: 21 (calc) (ref 6–22)
BUN: 23 mg/dL (ref 7–25)
CO2: 29 mmol/L (ref 20–32)
Calcium: 10.7 mg/dL — ABNORMAL HIGH (ref 8.6–10.4)
Chloride: 106 mmol/L (ref 98–110)
Creat: 1.07 mg/dL — ABNORMAL HIGH (ref 0.60–0.93)
Glucose, Bld: 88 mg/dL (ref 65–99)
Potassium: 4.4 mmol/L (ref 3.5–5.3)
Sodium: 143 mmol/L (ref 135–146)

## 2020-03-12 LAB — CBC WITH DIFFERENTIAL/PLATELET
Absolute Monocytes: 543 cells/uL (ref 200–950)
Basophils Absolute: 39 cells/uL (ref 0–200)
Basophils Relative: 0.7 %
Eosinophils Absolute: 179 cells/uL (ref 15–500)
Eosinophils Relative: 3.2 %
HCT: 44.6 % (ref 35.0–45.0)
Hemoglobin: 15.3 g/dL (ref 11.7–15.5)
Lymphs Abs: 1473 cells/uL (ref 850–3900)
MCH: 30.9 pg (ref 27.0–33.0)
MCHC: 34.3 g/dL (ref 32.0–36.0)
MCV: 90.1 fL (ref 80.0–100.0)
MPV: 11.1 fL (ref 7.5–12.5)
Monocytes Relative: 9.7 %
Neutro Abs: 3366 cells/uL (ref 1500–7800)
Neutrophils Relative %: 60.1 %
Platelets: 265 10*3/uL (ref 140–400)
RBC: 4.95 10*6/uL (ref 3.80–5.10)
RDW: 12.2 % (ref 11.0–15.0)
Total Lymphocyte: 26.3 %
WBC: 5.6 10*3/uL (ref 3.8–10.8)

## 2020-03-12 LAB — LIPID PANEL
Cholesterol: 182 mg/dL (ref <200)
HDL: 78 mg/dL (ref >=50)
LDL Cholesterol (Calc): 85 mg/dL (calc)
Non-HDL Cholesterol (Calc): 104 mg/dL (calc) (ref <130)
Total CHOL/HDL Ratio: 2.3 (calc) (ref <5.0)
Triglycerides: 94 mg/dL (ref <150)

## 2020-03-12 LAB — T4, FREE: Free T4: 1.7 ng/dL (ref 0.8–1.8)

## 2020-03-12 LAB — HEPATIC FUNCTION PANEL
AG Ratio: 1.9 (calc) (ref 1.0–2.5)
ALT: 15 U/L (ref 6–29)
AST: 27 U/L (ref 10–35)
Albumin: 4.6 g/dL (ref 3.6–5.1)
Alkaline phosphatase (APISO): 36 U/L — ABNORMAL LOW (ref 37–153)
Bilirubin, Direct: 0.1 mg/dL (ref 0.0–0.2)
Globulin: 2.4 g/dL (calc) (ref 1.9–3.7)
Indirect Bilirubin: 0.4 mg/dL (calc) (ref 0.2–1.2)
Total Bilirubin: 0.5 mg/dL (ref 0.2–1.2)
Total Protein: 7 g/dL (ref 6.1–8.1)

## 2020-03-12 LAB — TSH: TSH: 0.13 mIU/L — ABNORMAL LOW (ref 0.40–4.50)

## 2020-03-12 LAB — TEST AUTHORIZATION

## 2020-03-12 LAB — VITAMIN D 25 HYDROXY (VIT D DEFICIENCY, FRACTURES): Vit D, 25-Hydroxy: 48 ng/mL (ref 30–100)

## 2020-03-12 LAB — T3, FREE: T3, Free: 3 pg/mL (ref 2.3–4.2)

## 2020-03-16 ENCOUNTER — Other Ambulatory Visit: Payer: Self-pay | Admitting: Emergency Medicine

## 2020-03-16 DIAGNOSIS — R7989 Other specified abnormal findings of blood chemistry: Secondary | ICD-10-CM

## 2020-03-23 ENCOUNTER — Other Ambulatory Visit: Payer: Self-pay

## 2020-03-23 ENCOUNTER — Other Ambulatory Visit (INDEPENDENT_AMBULATORY_CARE_PROVIDER_SITE_OTHER): Payer: Medicare Other

## 2020-03-23 DIAGNOSIS — R7989 Other specified abnormal findings of blood chemistry: Secondary | ICD-10-CM

## 2020-03-23 LAB — BASIC METABOLIC PANEL
BUN: 23 mg/dL (ref 6–23)
CO2: 29 mEq/L (ref 19–32)
Calcium: 9.9 mg/dL (ref 8.4–10.5)
Chloride: 109 mEq/L (ref 96–112)
Creatinine, Ser: 1 mg/dL (ref 0.40–1.20)
GFR: 56.5 mL/min — ABNORMAL LOW (ref >60.00)
Glucose, Bld: 101 mg/dL — ABNORMAL HIGH (ref 70–99)
Potassium: 4 mEq/L (ref 3.5–5.1)
Sodium: 145 mEq/L (ref 135–145)

## 2020-03-24 ENCOUNTER — Telehealth: Payer: Self-pay

## 2020-03-24 NOTE — Telephone Encounter (Signed)
Patient is calling in stating she missed a call about lab results.

## 2020-04-08 ENCOUNTER — Ambulatory Visit (INDEPENDENT_AMBULATORY_CARE_PROVIDER_SITE_OTHER): Payer: Medicare Other | Admitting: Family Medicine

## 2020-04-08 ENCOUNTER — Encounter: Payer: Self-pay | Admitting: Family Medicine

## 2020-04-08 ENCOUNTER — Other Ambulatory Visit: Payer: Self-pay

## 2020-04-08 VITALS — BP 100/80 | HR 70 | Temp 97.7°F | Resp 15 | Ht 63.0 in | Wt 156.0 lb

## 2020-04-08 DIAGNOSIS — R0683 Snoring: Secondary | ICD-10-CM | POA: Diagnosis not present

## 2020-04-08 DIAGNOSIS — F418 Other specified anxiety disorders: Secondary | ICD-10-CM | POA: Diagnosis not present

## 2020-04-08 MED ORDER — FLUOXETINE HCL 20 MG PO CAPS: 20.0000 mg | ORAL_CAPSULE | Freq: Every day | ORAL | 3 refills | Status: DC

## 2020-04-08 NOTE — Patient Instructions (Signed)
Follow up in 1 month to recheck mood INCREASE the Fluoxetine to 20mg  daily- 2 of what you have at home and 1 of the new prescription We'll call you with your pulmonary referral to address the sleep issue Consider counseling- individual, group, [place], etc.  If you need resources, let me know Call with any questions or concerns Hang in there! HAPPY BELATED BIRTHDAY!!!

## 2020-04-08 NOTE — Assessment & Plan Note (Signed)
Somewhat improved since starting low dose Fluoxetine but we have much room for improvement.  Based on this, will increase to 20mg  daily.  Based on the addiction issues w/ her son, her differing view point than her husband- I strongly encouraged counseling.  Whether group, addiction based, individual- I think this would be of great benefit to her.  She states she will think about it.  Total time spent w/ pt >30 minutes, time spent counseling >50%.

## 2020-04-08 NOTE — Progress Notes (Signed)
   Subjective:    Patient ID: Tracy [name], female    DOB: 1950-03-28, 70 y.o.   MRN: [id number]  HPI Depression w/ anxiety- pt was started on Fluoxetine 10mg  at last visit.  Pt reports 'some' improvement since starting medication.  Continues to have crying spells.  Is having difficulty w/ her son-hx of addiction, has been clean x2 yrs but she feels that she and her husband keep bailing him out of bad situations- thus enabling.  Husband disagrees.  This causes strain on their marriage.  Loud snoring- husband is not able to sleep in the same room.  Pt is having excessive daytime sleepiness.  Will fall asleep without realizing it.   Review of Systems For ROS see HPI   This visit occurred during the SARS-CoV-2 public health emergency.  Safety protocols were in place, including screening questions prior to the visit, additional usage of staff PPE, and extensive cleaning of exam room while observing appropriate contact time as indicated for disinfecting solutions.       Objective:   Physical Exam Vitals reviewed.  Constitutional:      General: She is not in acute distress.    Appearance: Normal appearance. She is not ill-appearing.  HENT:     Head: Normocephalic and atraumatic.  Neurological:     General: No focal deficit present.     Mental Status: She is alert and oriented to person, place, and time.  Psychiatric:        Mood and Affect: Mood normal.        Behavior: Behavior normal.        Thought Content: Thought content normal.           Assessment & Plan:  Snoring- new.  Given reports of snoring and excessive daytime sleepiness will refer to pulmonary for OSA evaluation.  Pt expressed understanding and is in agreement w/ plan.

## 2020-04-16 ENCOUNTER — Encounter: Payer: Self-pay | Admitting: Family Medicine

## 2020-04-16 NOTE — Telephone Encounter (Signed)
I have LM making mom aware that Dr. Birdie Riddle will take Tracy [name] but she is scheduled out for a NP appt/ [facility]

## 2020-04-17 ENCOUNTER — Encounter: Payer: Self-pay | Admitting: Family Medicine

## 2020-04-27 ENCOUNTER — Telehealth: Payer: Self-pay

## 2020-04-27 DIAGNOSIS — M2041 Other hammer toe(s) (acquired), right foot: Secondary | ICD-10-CM | POA: Diagnosis not present

## 2020-04-27 DIAGNOSIS — M2031 Hallux varus (acquired), right foot: Secondary | ICD-10-CM | POA: Diagnosis not present

## 2020-04-27 DIAGNOSIS — M2040 Other hammer toe(s) (acquired), unspecified foot: Secondary | ICD-10-CM | POA: Insufficient documentation

## 2020-04-27 NOTE — Chronic Care Management (AMB) (Signed)
    Chronic Care Management Pharmacy Assistant   Name: CANDITA BORENSTEIN  MRN: [id number] DOB: April 12, 1950  Reason for Encounter: Patient Assistance Coordination  PCP : [name], MD  Allergies:   Allergies  Allergen Reactions  . Augmentin [Amoxicillin-Pot Clavulanate] Diarrhea    Severe diarrhea  . Demerol [Meperidine] Nausea And Vomiting  . Latex Rash, Other (See Comments) and Hives    Reaction:  Blisters   . Other Itching    Nylon sheets, pt prefers cotton sheets   . Tramadol Nausea And Vomiting    Medications: Outpatient Encounter Medications as of 04/27/2020  Medication Sig  . Calcium Carbonate-Vitamin D (CALCIUM 600+D PO) Take 1 tablet by mouth 2 (two) times daily.  . Cholecalciferol (VITAMIN D) 2000 units tablet Take 2,000 Units by mouth daily.  . fenofibrate 160 MG tablet TAKE 1 TABLET BY MOUTH  DAILY  . FLUoxetine (PROZAC) 20 MG capsule Take 1 capsule (20 mg total) by mouth daily.  . fluticasone (FLONASE) 50 MCG/ACT nasal spray USE 1 SPRAY IN BOTH  NOSTRILS DAILY (Patient taking differently: Place 1 spray into both nostrils daily.)  . loteprednol (LOTEMAX) 0.5 % ophthalmic suspension Place 1 drop into both eyes in the morning, at noon, in the evening, and at bedtime.  . Melatonin 5 MG TABS Take 5 mg by mouth at bedtime.  . Multiple Vitamin (MULTIVITAMIN WITH MINERALS) TABS tablet Take 1 tablet by mouth daily.  . pantoprazole (PROTONIX) 40 MG tablet Take 1 tablet (40 mg total) by mouth 2 (two) times daily.  . polyethylene glycol (MIRALAX / GLYCOLAX) packet Take 17 g by mouth daily.  . RESTASIS 0.05 % ophthalmic emulsion Place 1 drop into both eyes 2 (two) times daily.   No facility-administered encounter medications on file as of 04/27/2020.    Current Diagnosis: Patient Active Problem List   Diagnosis Date Noted  . Dyspnea on exertion 10/10/2018  . Overweight (BMI 25.0-29.9) 11/28/2017  . 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 Shoulder arthritis  03/01/2017  . Perforation of sigmoid colon due to diverticulitis 07/05/2016  . OA (osteoarthritis) of knee 08/17/2015  . 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 Subclinical hyperthyroidism 06/05/2014  . Trochanteric bursitis of left hip 04/01/2014  . Rosacea 03/26/2014  . Allergic rhinitis 02/19/2014  . Left hip pain 12/19/2013  . Atrophic vaginitis 10/07/2013  . Screening for malignant neoplasm of the cervix 01/12/2011  . Physical exam 01/12/2011  . HYPERTRIGLYCERIDEMIA 12/03/2009  . GERD 12/03/2009  . UTI'S, RECURRENT 12/03/2009  . LEG CRAMPS, NOCTURNAL 12/03/2009  . DIVERTICULITIS, HX OF 12/03/2009  . Depression with anxiety 11/05/2009  . Osteoporosis 11/05/2009    I called my [employer] Assist Program to inquire status on application for medication Restasis. I was told this was not sent to their program. I called and spoke with the patient, she does not wish to proceed with this application at this time.  April D Calhoun, [place] Pharmacist Assistant [phone]   Follow-Up:  Patient Assistance Coordination and Pharmacist Review

## 2020-05-05 DIAGNOSIS — M2041 Other hammer toe(s) (acquired), right foot: Secondary | ICD-10-CM | POA: Diagnosis not present

## 2020-05-06 ENCOUNTER — Encounter: Payer: Self-pay | Admitting: Family Medicine

## 2020-05-06 ENCOUNTER — Telehealth (INDEPENDENT_AMBULATORY_CARE_PROVIDER_SITE_OTHER): Payer: Medicare Other | Admitting: Family Medicine

## 2020-05-06 DIAGNOSIS — F418 Other specified anxiety disorders: Secondary | ICD-10-CM

## 2020-05-06 NOTE — Progress Notes (Signed)
Virtual Visit via Video   I connected with patient on 05/06/20 at 11:00 AM EST by a video enabled telemedicine application and verified that I am speaking with the correct person using two identifiers.  Location patient: Home Location provider: [facility], Office Persons participating in the virtual visit: Patient, Provider, [place] (Sabrina M)  I discussed the limitations of evaluation and management by telemedicine and the availability of in person appointments. The patient expressed understanding and agreed to proceed.  Subjective:   HPI:   Depression w/ anxiety- at last visit Prozac was increased from 10mg  --> 20mg .  Husband notes improvement in overall mood.  Pt also reports 'i'm doing pretty good, actually'.  Her 1 concern is that she feels she has less energy since increasing the dose.  She is taking meds in the morning.  ROS:   See pertinent positives and negatives per HPI.  Patient Active Problem List   Diagnosis Date Noted  . Hammer toe 04/27/2020  . Dyspnea on exertion 10/10/2018  . Overweight (BMI 25.0-29.9) 11/28/2017  . 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 Shoulder arthritis 03/01/2017  . Perforation of sigmoid colon due to diverticulitis 07/05/2016  . OA (osteoarthritis) of knee 08/17/2015  . 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 Subclinical hyperthyroidism 06/05/2014  . Trochanteric bursitis of left hip 04/01/2014  . Rosacea 03/26/2014  . Allergic rhinitis 02/19/2014  . Left hip pain 12/19/2013  . Atrophic vaginitis 10/07/2013  . Screening for malignant neoplasm of the cervix 01/12/2011  . Physical exam 01/12/2011  . HYPERTRIGLYCERIDEMIA 12/03/2009  . GERD 12/03/2009  . UTI'S, RECURRENT 12/03/2009  . LEG CRAMPS, NOCTURNAL 12/03/2009  . DIVERTICULITIS, HX OF 12/03/2009  . Depression with anxiety 11/05/2009  . Osteoporosis 11/05/2009    Social History   Tobacco Use  . Smoking status: Never Smoker  . Smokeless tobacco: Never Used   Substance Use Topics  . Alcohol use: No    Current Outpatient Medications:  .  Calcium Carbonate-Vitamin D (CALCIUM 600+D PO), Take 1 tablet by mouth 2 (two) times daily., Disp: , Rfl:  .  Cholecalciferol (VITAMIN D) 2000 units tablet, Take 2,000 Units by mouth daily., Disp: , Rfl:  .  fenofibrate 160 MG tablet, TAKE 1 TABLET BY MOUTH  DAILY, Disp: 90 tablet, Rfl: 3 .  FLUoxetine (PROZAC) 20 MG capsule, Take 1 capsule (20 mg total) by mouth daily., Disp: 30 capsule, Rfl: 3 .  fluticasone (FLONASE) 50 MCG/ACT nasal spray, USE 1 SPRAY IN BOTH  NOSTRILS DAILY (Patient taking differently: Place 1 spray into both nostrils daily.), Disp: 32 g, Rfl: 3 .  Melatonin 5 MG TABS, Take 5 mg by mouth at bedtime., Disp: , Rfl:  .  Multiple Vitamin (MULTIVITAMIN WITH MINERALS) TABS tablet, Take 1 tablet by mouth daily., Disp: , Rfl:  .  pantoprazole (PROTONIX) 40 MG tablet, Take 1 tablet (40 mg total) by mouth 2 (two) times daily., Disp: 180 tablet, Rfl: 1 .  polyethylene glycol (MIRALAX / GLYCOLAX) packet, Take 17 g by mouth daily., Disp: 14 each, Rfl: 0 .  RESTASIS 0.05 % ophthalmic emulsion, Place 1 drop into both eyes 2 (two) times daily., Disp: , Rfl:  .  loteprednol (LOTEMAX) 0.5 % ophthalmic suspension, Place 1 drop into both eyes in the morning, at noon, in the evening, and at bedtime. (Patient not taking: Reported on 05/06/2020), Disp: , Rfl:   Allergies  Allergen Reactions  . Augmentin [Amoxicillin-Pot Clavulanate] Diarrhea    Severe diarrhea  . Demerol [Meperidine]  Nausea And Vomiting  . Latex Rash, Other (See Comments) and Hives    Reaction:  Blisters   . Other Itching    Nylon sheets, pt prefers cotton sheets   . Tramadol Nausea And Vomiting    Objective:   There were no vitals taken for this visit. AAOx3, NAD NCAT, EOMI No obvious CN deficits Coloring WNL Pt is able to speak clearly, coherently without shortness of breath or increased work of breathing.  Thought process is linear.   Mood is appropriate.   Assessment and Plan:   Depression w/ anxiety- improved.  Pt's mood is better since increasing the Prozac.  Rather than tapering down or off the medication, will have pt take it in the evening and see if she feels her energy improves.  She is to message me in the next 1-2 weeks if there is no change.  Pt expressed understanding and is in agreement w/ plan.    Annye Asa, MD 05/06/2020

## 2020-05-06 NOTE — Progress Notes (Signed)
I connected with  [name] on 05/06/20 by a video enabled telemedicine application and verified that I am speaking with the correct person using two identifiers.   I discussed the limitations of evaluation and management by telemedicine. The patient expressed understanding and agreed to proceed.

## 2020-05-15 ENCOUNTER — Telehealth: Payer: Self-pay

## 2020-05-15 ENCOUNTER — Ambulatory Visit (INDEPENDENT_AMBULATORY_CARE_PROVIDER_SITE_OTHER): Payer: Medicare Other

## 2020-05-15 DIAGNOSIS — F418 Other specified anxiety disorders: Secondary | ICD-10-CM | POA: Diagnosis not present

## 2020-05-15 DIAGNOSIS — E781 Pure hyperglyceridemia: Secondary | ICD-10-CM | POA: Diagnosis not present

## 2020-05-15 DIAGNOSIS — M810 Age-related osteoporosis without current pathological fracture: Secondary | ICD-10-CM | POA: Diagnosis not present

## 2020-05-15 DIAGNOSIS — K219 Gastro-esophageal reflux disease without esophagitis: Secondary | ICD-10-CM

## 2020-05-15 NOTE — Patient Instructions (Addendum)
Tracy [name],  Thank you for taking the time to review your medications with me today.  I have included our care plan/goals in the following pages. Please review and call me at [phone] with any questions!  Thanks! Ellin Mayhew, Pharm.D., BCGP Clinical Pharmacist [facility] [place] [phone] Patient Care Plan: [place] Plan    Problem Identified: Osteoporosis, GERD, Depression/Anxiety, hypertriglyceridemia,   Priority: High    Long-Range Goal: Disease Management   Start Date: 05/15/2020  Expected End Date: 05/15/2021  This Visit's Progress: On track  Priority: High  Note:    Current Barriers:  . Chronic disease management  Pharmacist Clinical Goal(s):  Marland Kitchen Over the next 365 days, patient will verbalize ability to afford treatment regimen . achieve adherence to monitoring guidelines and medication adherence to achieve therapeutic efficacy . contact provider office for questions/concerns as evidenced notation of same in electronic health record through collaboration with PharmD and provider.   Interventions: . 1:1 collaboration with [name], MD regarding development and update of comprehensive plan of care as evidenced by provider attestation and co-signature . Inter-disciplinary care team collaboration (see longitudinal plan of care) . Comprehensive medication review performed; medication list updated in electronic medical record  Hypertriglyceridemia (TG goal <150) -Controlled  -Peak TG 411 12/2011 -Most recent TG 94 02/2020 -Low ASCVD risk -Alcohol use: none -Current treatment: . Fenofibrate 160 mg (2013) -Medications previously tried: atorvastatin tried 2013-2014 (leg cramps)  -Current dietary patterns: cutting down on sweets, focusing on fruit and vegetable intake. Cereal or egg sandwich for breakfast -Current exercise habits: walking daily  -Educated on Exercise goal of 150 minutes per  week; -Counseled on diet and exercise extensively Assessed side effects - no problems noted  Depression/Anxiety (Goal: minimize symptoms) -Uncontrolled Fluoxetine 10 mg started 03/06/2020, was increased to 20 mg 04/08/2020. Recently met with pcp for virtual visit 05/06/2020- advised to take fluoxetine 20 mg before bed. Today she reports night time use has been causing her to wake up with headaches. She continues to feel lethargic and states problems are mostly sleep oriented and is now requesting to go off of medication. Does not want to consider alternative such as sertraline. Could consider buspar if patient is willing at f/u.  -Current treatment: . Fluoxetine 20 mg once daily  -Medications previously tried/failed: cymbalta, fluoxetine 10 mg started 03/06/2020 and increased to 20 mg 04/08/2020 -Educated on Benefits of medication for symptom control -Patient requesting to go off of fluoxetine 20 mg - RPH will talk with PCP   Osteoporosis / Osteopenia (Goal ensure medication safety) -Controlled -Last DEXA Scan: 2019- osteoporosis  -Long term PPI use -Current treatment  . Prolia injections every 6 months (last dose 02/2020, started 07/2017) . Calcium/vitamin-d3 supplementation -Medications previously tried: alendronate (did not tolerate)  -Recommend [phone] units of vitamin D daily. Recommend 1200 mg of calcium daily from dietary and supplemental sources. Recommend weight-bearing and muscle strengthening exercises for building and maintaining bone density. -Counseled on diet and exercise extensively Recommended to continue current medication  GERD (Goal: minimize symptoms) -Controlled -Current treatment  . Pantoprazole 40 mg twice daily  -Counseled on reflux triggers - expresses understanding of avoidance of common triggers  -Consider b12 lab at next f/u   Patient Goals/Self-Care Activities . Over the next 365 days, patient will:  - take medications as prescribed target a minimum of 150  minutes of moderate intensity exercise weekly      The patient verbalized  understanding of instructions provided today and agreed to receive a MyChart copy of patient instruction and/or educational materials. Telephone follow up appointment with pharmacy team member scheduled for: See next appointment with "Care Management Staff" under "What's Next" below.

## 2020-05-15 NOTE — Progress Notes (Signed)
Chronic Care Management Pharmacy Note  05/15/2020 Name:  Tracy [name] MRN:  [id number] DOB:  1950/10/31  Subjective: Tracy [name] is an 70 y.o. year old female who is a primary patient of Tabori, Aundra Millet, MD.  The CCM team was consulted for assistance with disease management and care coordination needs.    Engaged with patient by telephone for follow up visit in response to provider referral for pharmacy case management and/or care coordination services.   Consent to Services:  The patient was given information about Chronic Care Management services, agreed to services, and gave verbal consent prior to initiation of services.  Please see initial visit note for detailed documentation.   Patient Care Team: [name], MD as PCP - General Gwenlyn Found Pearletha Forge, MD as PCP - Cardiology (Cardiology) [name], MD as Consulting Physician (Internal Medicine) Festus Aloe, MD as Consulting Physician (Urology) Gaynelle Arabian, MD as Consulting Physician (Orthopedic Surgery) Harriett Sine, MD as Consulting Physician (Dermatology) Mauri Pole, MD as Consulting Physician (Gastroenterology) Hortencia Pilar, MD as Consulting Physician (Surgery) Netta Cedars, MD as Consulting Physician (Orthopedic Surgery) Wylene Simmer, MD as Consulting Physician (Orthopedic Surgery) Madelin Rear, [facility] as Pharmacist (Pharmacist)  Recent office visits: 04/08/2020 (PCP): f/u on depression started on fluoxetine 10 mg 03/06/2020 increased to 20 mg 04/08/2020. New problem of snoring and excessive daytime sleepiness- pulm referral for OSA evaluation.   Hospital visits: None in previous 6 months  Objective: Lab Results  Component Value Date   CREATININE 1.00 03/23/2020   CREATININE 1.07 (H) 03/06/2020   CREATININE 0.95 03/12/2019   GFR 56.50 (L) 03/23/2020   GFR 58.00 (L) 03/12/2019   GFRNONAA 49 (L) 10/17/2018   GFRNONAA 55 (L) 06/24/2017  Last diabetic Eye exam: No  results found for: HMDIABEYEEXA  Last diabetic Foot exam: No results found for: HMDIABFOOTEX  Lab Results  Component Value Date   CHOL 182 03/06/2020   CHOL 190 03/12/2019   TRIG 94 03/06/2020   TRIG 131.0 03/12/2019   HDL 78 03/06/2020   HDL 61.60 03/12/2019   CHOLHDL 2.3 03/06/2020   CHOLHDL 3 03/12/2019   VLDL 26.2 03/12/2019   VLDL 18.4 11/28/2017   LDLCALC 85 03/06/2020   LDLCALC 102 (H) 03/12/2019   LDLDIRECT 69.8 01/16/2012   Hepatic Function Latest Ref Rng & Units 03/06/2020 03/12/2019 08/10/2018  Total Protein 6.1 - 8.1 g/dL 7.0 6.6 7.0  Albumin 3.5 - 5.2 g/dL - 4.1 4.7  AST 10 - 35 U/L _0 ALT 6 - 29 U/L _1 Alk Phosphatase 39 - 117 U/L - 36(L) 36(L)  Total Bilirubin 0.2 - 1.2 mg/dL 0.5 0.5 0.5  Bilirubin, Direct 0.0 - 0.2 mg/dL 0.1 0.1 0.1   Lab Results  Component Value Date/Time   TSH 0.13 (L) 03/06/2020 11:41 AM   TSH 0.04 (L) 03/12/2019 09:46 AM   FREET4 1.7 03/06/2020 11:41 AM   FREET4 1.57 03/14/2019 11:59 AM   CBC Latest Ref Rng & Units 03/06/2020 03/12/2019 02/25/2019  WBC 3.8 - 10.8 Thousand/uL 5.6 5.2 7.4  Hemoglobin 11.7 - 15.5 g/dL 15.3 13.0 14.1  Hematocrit 35.0 - 45.0 % 44.6 39.1 41.9  Platelets 140 - 400 Thousand/uL 265 223.0 232.0   Lab Results  Component Value Date/Time   VD25OH 48 03/06/2020 11:41 AM   VD25OH 50.31 03/12/2019 09:46 AM   VD25OH 56.91 06/01/2017 09:46 AM   Clinical ASCVD: No  The 10-year ASCVD risk score Mikey Bussing DC Brooke Bonito., et al.,  2013) is: 7%   Values used to calculate the score:     Age: 3 years     Sex: Female     Is Non-Hispanic African American: No     Diabetic: No     Tobacco smoker: No     Systolic Blood Pressure: 100 mmHg     Is BP treated: No     HDL Cholesterol: 78 mg/dL     Total Cholesterol: 182 mg/dL    Social History   Tobacco Use  Smoking Status Never Smoker  Smokeless Tobacco Never Used   BP Readings from Last 3 Encounters:  04/08/20 100/80  03/06/20 128/80  07/15/19 134/84   Pulse  Readings from Last 3 Encounters:  04/08/20 70  03/06/20 62  07/15/19 78   Wt Readings from Last 3 Encounters:  04/08/20 156 lb (70.8 kg)  03/09/20 153 lb (69.4 kg)  03/06/20 153 lb (69.4 kg)   Assessment: Review of patient past medical history, allergies, medications, health status, including review of consultants reports, laboratory and other test data, was performed as part of comprehensive evaluation and provision of chronic care management services.   SDOH:  (Social Determinants of Health) assessments and interventions performed:   CCM Care Plan Allergies  Allergen Reactions  . Augmentin [Amoxicillin-Pot Clavulanate] Diarrhea    Severe diarrhea  . Demerol [Meperidine] Nausea And Vomiting  . Latex Rash, Other (See Comments) and Hives    Reaction:  Blisters   . Other Itching    Nylon sheets, pt prefers cotton sheets   . Tramadol Nausea And Vomiting   Medications Reviewed Today    Reviewed by [name], MD (Physician) on 05/06/20 at 1115  Med List Status: <None>  Medication Order Taking? Sig Documenting Provider Last Dose Status Informant  Calcium Carbonate-Vitamin D (CALCIUM 600+D PO) [id number] Yes Take 1 tablet by mouth 2 (two) times daily. [provider] Taking Active Self           Med Note Caryn Section, KYLE A   Wed Jun 14, 2017  5:39 PM)    Cholecalciferol (VITAMIN D) 2000 units tablet [id number] Yes Take 2,000 Units by mouth daily. [provider] Taking Active Self  fenofibrate 160 MG tablet [id number] Yes TAKE 1 TABLET BY MOUTH  DAILY [name], MD Taking Active   FLUoxetine (PROZAC) 20 MG capsule [id number] Yes Take 1 capsule (20 mg total) by mouth daily. [name], MD Taking Active   fluticasone Asencion Islam) 50 MCG/ACT nasal spray [id number] Yes USE 1 SPRAY IN BOTH  NOSTRILS DAILY  Patient taking differently: Place 1 spray into both nostrils daily.   [name], MD Taking Active   loteprednol (LOTEMAX) 0.5 % ophthalmic  suspension [id number] No Place 1 drop into both eyes in the morning, at noon, in the evening, and at bedtime.  Patient not taking: Reported on 05/06/2020   [provider] Not Taking Active Self  Melatonin 5 MG TABS [id number] Yes Take 5 mg by mouth at bedtime. [provider] Taking Active Self  Multiple Vitamin (MULTIVITAMIN WITH MINERALS) TABS tablet [id number] Yes Take 1 tablet by mouth daily. [provider] Taking Active Self           Med Note (DAVIS, SOPHIA A   Mon Mar 25, 2019 10:57 AM)    pantoprazole (PROTONIX) 40 MG tablet [id number] Yes Take 1 tablet (40 mg total) by mouth 2 (two) times daily. [name], MD Taking Active   polyethylene glycol [facility] /  GLYCOLAX) packet [id number] Yes Take 17 g by mouth daily. Brunetta Jeans, PA-C Taking Active Self  RESTASIS 0.05 % ophthalmic emulsion [id number] Yes Place 1 drop into both eyes 2 (two) times daily. [provider] Taking Active Self         Patient Active Problem List   Diagnosis Date Noted  . Hammer toe 04/27/2020  . Dyspnea on exertion 10/10/2018  . Overweight (BMI 25.0-29.9) 11/28/2017  . 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 Shoulder arthritis 03/01/2017  . Perforation of sigmoid colon due to diverticulitis 07/05/2016  . OA (osteoarthritis) of knee 08/17/2015  . 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 Subclinical hyperthyroidism 06/05/2014  . Trochanteric bursitis of left hip 04/01/2014  . Rosacea 03/26/2014  . Allergic rhinitis 02/19/2014  . Left hip pain 12/19/2013  . Atrophic vaginitis 10/07/2013  . Screening for malignant neoplasm of the cervix 01/12/2011  . Physical exam 01/12/2011  . HYPERTRIGLYCERIDEMIA 12/03/2009  . GERD 12/03/2009  . UTI'S, RECURRENT 12/03/2009  . LEG CRAMPS, NOCTURNAL 12/03/2009  . DIVERTICULITIS, HX OF 12/03/2009  . Depression with anxiety 11/05/2009  . Osteoporosis 11/05/2009   Immunization History  Administered Date(s) Administered  .  Influenza Whole 11/28/2012  . Influenza, High Dose Seasonal PF 11/10/2014, 10/26/2015, 11/09/2016, 10/31/2018, 11/20/2019  . Influenza,inj,Quad PF,6+ Mos 12/09/2013, 10/30/2017  . Influenza-Unspecified 10/31/2018  . PFIZER(Purple Top)SARS-COV-2 Vaccination 03/22/2019, 04/12/2019, 12/04/2019  . Pneumococcal Conjugate-13 09/08/2014  . Pneumococcal Polysaccharide-23 07/16/2013  . Tdap 06/28/2012   Conditions to be addressed/monitored: Osteoporosis, GERD, Depression/Anxiety, hypertriglyceridemia  Care Plan : [place]  Updates made by Madelin Rear, [facility] since 05/15/2020 12:00 AM    Problem: Osteoporosis, GERD, Depression/Anxiety, hypertriglyceridemia,   Priority: High    Long-Range Goal: Disease Management   Start Date: 05/15/2020  Expected End Date: 05/15/2021  This Visit's Progress: On track  Priority: High  Note:    Current Barriers:  . Chronic disease management  Pharmacist Clinical Goal(s):  Marland Kitchen Over the next 365 days, patient will verbalize ability to afford treatment regimen . achieve adherence to monitoring guidelines and medication adherence to achieve therapeutic efficacy . contact provider office for questions/concerns as evidenced notation of same in electronic health record through collaboration with PharmD and provider.   Interventions: . 1:1 collaboration with [name], MD regarding development and update of comprehensive plan of care as evidenced by provider attestation and co-signature . Inter-disciplinary care team collaboration (see longitudinal plan of care) . Comprehensive medication review performed; medication list updated in electronic medical record  Hypertriglyceridemia (TG goal <150) -Controlled  -Peak TG 411 12/2011 -Most recent TG 94 02/2020 -Low ASCVD risk -Alcohol use: none -Current treatment: . Fenofibrate 160 mg (2013) -Medications previously tried: atorvastatin tried 2013-2014 (leg cramps)  -Current dietary patterns:  cutting down on sweets, focusing on fruit and vegetable intake. Cereal or egg sandwich for breakfast -Current exercise habits: walking daily  -Educated on Exercise goal of 150 minutes per week; -Counseled on diet and exercise extensively Assessed side effects - no problems noted  Depression/Anxiety (Goal: minimize symptoms) -Uncontrolled Fluoxetine 10 mg started 03/06/2020, was increased to 20 mg 04/08/2020. Recently met with pcp for virtual visit 05/06/2020- advised to take fluoxetine 20 mg before bed. Today she reports night time use has been causing her to wake up with headaches. She continues to feel lethargic and states problems are mostly sleep oriented and is now requesting to go off of medication. Does not want to consider alternative such as sertraline. Could consider buspar  if patient is willing at f/u.  -Current treatment: . Fluoxetine 20 mg once daily  -Medications previously tried/failed: cymbalta, fluoxetine 10 mg started 03/06/2020 and increased to 20 mg 04/08/2020 -Educated on Benefits of medication for symptom control -Patient requesting to go off of fluoxetine 20 mg - RPH will talk with PCP   Osteoporosis / Osteopenia (Goal ensure medication safety) -Controlled -Last DEXA Scan: 2019- osteoporosis  -Long term PPI use -Current treatment  . Prolia injections every 6 months (last dose 02/2020, started 07/2017) . Calcium/vitamin-d3 supplementation -Medications previously tried: alendronate (did not tolerate)  -Recommend [id number] units of vitamin D daily. Recommend 1200 mg of calcium daily from dietary and supplemental sources. Recommend weight-bearing and muscle strengthening exercises for building and maintaining bone density. -Counseled on diet and exercise extensively Recommended to continue current medication  GERD (Goal: minimize symptoms) -Controlled -Current treatment  . Pantoprazole 40 mg twice daily  -Counseled on reflux triggers - expresses understanding of avoidance of  common triggers  -Consider b12 lab at next f/u   Patient Goals/Self-Care Activities . Over the next 365 days, patient will:  - take medications as prescribed target a minimum of 150 minutes of moderate intensity exercise weekly    Medication Assistance: None required.  Patient affirms current coverage meets needs.  Patient's preferred pharmacy is:  [id number] [address], [address] AT [facility] OF [address] [place] [address] Phone: [phone] Fax: [phone]  [place], [address], [address] [place], [place] [address] [place] [address] Phone: [phone] Fax: [phone]  Follow Up:  Patient agrees to Care Plan and Follow-up. Plan: 6 month telephone visit Future Appointments  Date Time Provider [place]  06/03/2020 10:00 AM Chesley Mires, MD [facility] None  11/26/2020  1:00 PM [facility] CCM PHARMACIST [facility]   Madelin Rear, Pharm.D., BCGP Clinical Pharmacist [facility] [place] [phone]

## 2020-05-15 NOTE — Telephone Encounter (Signed)
-----   Message from [name], MD sent at 05/15/2020  2:51 PM EDT ----- I'm ok with her stopping the medication.  She may want to drop down to every other night for a week or 2 and then stop to lessen risk of withdrawal symptoms  KT ----- Message ----- From: Madelin Rear, [facility] Sent: 05/15/2020   2:21 PM EDT To: [name], MD  Patient wanted me to ask if you would be ok with her stopping fluoxetine 20 mg. Following 05/06/2020 video visit she's been taking fluoxetine every night before bed and now feels this is giving her headaches throughout the night and not helping her.   If you are ok with this, I will call patient and tell her to stop. Thanks!

## 2020-05-20 ENCOUNTER — Other Ambulatory Visit: Payer: Self-pay | Admitting: Family Medicine

## 2020-05-20 DIAGNOSIS — S98139A Complete traumatic amputation of one unspecified lesser toe, initial encounter: Secondary | ICD-10-CM | POA: Insufficient documentation

## 2020-06-03 ENCOUNTER — Other Ambulatory Visit: Payer: Self-pay

## 2020-06-03 ENCOUNTER — Ambulatory Visit (INDEPENDENT_AMBULATORY_CARE_PROVIDER_SITE_OTHER): Payer: Medicare Other | Admitting: Pulmonary Disease

## 2020-06-03 ENCOUNTER — Encounter: Payer: Self-pay | Admitting: Pulmonary Disease

## 2020-06-03 VITALS — BP 124/72 | HR 72 | Temp 97.8°F | Ht 65.0 in | Wt 147.2 lb

## 2020-06-03 DIAGNOSIS — R0683 Snoring: Secondary | ICD-10-CM

## 2020-06-03 NOTE — Patient Instructions (Signed)
Will arrange for home sleep study Will call to arrange for follow up after sleep study reviewed  

## 2020-06-03 NOTE — Progress Notes (Signed)
[facility]  Chief Complaint  Patient presents with  . Consult    Sleep consult    Constitutional:  BP 124/72 (BP Location: Left Arm, Cuff Size: Normal)   Pulse 72   Temp 97.8 F (36.6 C) (Temporal)   Ht 5\' 5"  (1.651 m)   Wt 147 lb 3.2 oz (66.8 kg)   SpO2 96% Comment: Room air  BMI 24.50 kg/m   Past Medical History:  Allergies, Anxiety, OA, C diff, Cataract, Diverticulitis, GERD, HH, HLD, Hypothyroidism, Osteopenia, Osteoporosis, Rosacea  Past Surgical History:  She  has a past surgical history that includes Knee surgery; Back surgery; Laparoscopic liver cyst fenestration; Foot surgery (Right); Cholecystectomy; Tonsillectomy; Appendectomy; Tubal ligation; Open surgical repair of gluteal tendon (Left, 04/01/2014); Total knee arthroplasty (Right, 08/17/2015); Reverse shoulder arthroplasty (Right, 06/23/2017); and Cataract extraction, bilateral (11/2018).  Brief Summary:  Tracy [name] is a 70 y.o. female with snoring.      Subjective:   Her husband has been worried her snoring for years.  This has been getting worse.  They sleep in separate bedrooms.  She will stop breathing at night, and wake up with a snort.  She can't sleep on her sides due to orthopedic issues.  She can fall asleep anytime she is sitting for too long.  She goes to sleep at 10 pm.  She falls asleep in 10 minutes.  She wakes up several times to use the bathroom.  She gets out of bed at 7 am.  She feels tired in the morning.  She has been getting morning headaches.  She takes melatonin before going to bed.  She drinks a cup of coffee in the morning.  She denies sleep walking, sleep talking, bruxism, or nightmares.  There is no history of restless legs.  She denies sleep hallucinations, sleep paralysis, or cataplexy.  The Epworth score is 13 out of 24.    Physical Exam:   Appearance - well kempt   ENMT - no sinus tenderness, no oral exudate, no LAN, Mallampati  2 airway, no stridor, decreased AP diameter  Respiratory - equal breath sounds bilaterally, no wheezing or rales  CV - s1s2 regular rate and rhythm, no murmurs  Ext - no clubbing, no edema  Skin - no rashes  Psych - normal mood and affect   Sleep Tests:    Cardiac Tests:   Echo 10/17/18 >> EF 60 to 65%  Social History:  She  reports that she has never smoked. She has never used smokeless tobacco. She reports that she does not drink alcohol and does not use drugs.  Family History:  Her family history includes Bone cancer in her paternal grandfather; Hypertension in her mother; Liver disease in her father; Stroke in her mother.    Discussion:  She has snoring, sleep disruption, apnea, and daytime sleepiness.  She has history of mood disorder.  I am concerned she could have obstructive sleep apnea.  Assessment/Plan:   Snoring with excessive daytime sleepiness. - will need to arrange for a home sleep study  Obesity. - discussed how weight can impact sleep and risk for sleep disordered breathing - discussed options to assist with weight loss: combination of diet modification, cardiovascular and strength training exercises  Cardiovascular risk. - had an extensive discussion regarding the adverse health consequences related to untreated sleep disordered breathing - specifically discussed the risks for hypertension, coronary artery disease, cardiac dysrhythmias, cerebrovascular disease, and diabetes - lifestyle modification discussed  Safe driving practices. - discussed how sleep disruption can increase risk of accidents, particularly when driving - safe driving practices were discussed  Therapies for obstructive sleep apnea. - if the sleep study shows significant sleep apnea, then various therapies for treatment were reviewed: CPAP, oral appliance, and surgical interventions   Time Spent Involved in Patient Care on Day of Examination:  32 minutes  Follow up:  Patient  Instructions  Will arrange for home sleep study Will call to arrange for follow up after sleep study reviewed    Medication List:   Allergies as of 06/03/2020      Reactions   Augmentin [amoxicillin-pot Clavulanate] Diarrhea   Severe diarrhea   Demerol [meperidine] Nausea And Vomiting   Latex Rash, Other (See Comments), Hives   Reaction:  Blisters    Other Itching   Nylon sheets, pt prefers cotton sheets    Tramadol Nausea And Vomiting      Medication List       Accurate as of June 03, 2020 10:25 AM. If you have any questions, ask your nurse or doctor.        STOP taking these medications   FLUoxetine 20 MG capsule Commonly known as: PROzac Stopped by: Chesley Mires, MD     TAKE these medications   CALCIUM 600+D PO Take 1 tablet by mouth 2 (two) times daily.   fenofibrate 160 MG tablet TAKE 1 TABLET BY MOUTH  DAILY   fluticasone 50 MCG/ACT nasal spray Commonly known as: FLONASE USE 1 SPRAY IN BOTH  NOSTRILS DAILY   loteprednol 0.5 % ophthalmic suspension Commonly known as: LOTEMAX Place 1 drop into both eyes in the morning, at noon, in the evening, and at bedtime.   melatonin 5 MG Tabs Take 5 mg by mouth at bedtime.   multivitamin with minerals Tabs tablet Take 1 tablet by mouth daily.   pantoprazole 40 MG tablet Commonly known as: PROTONIX Take 1 tablet (40 mg total) by mouth 2 (two) times daily.   polyethylene glycol 17 g packet Commonly known as: MIRALAX / GLYCOLAX Take 17 g by mouth daily.   Restasis 0.05 % ophthalmic emulsion Generic drug: cycloSPORINE Place 1 drop into both eyes 2 (two) times daily.   Vitamin D 50 MCG (2000 UT) tablet Take 2,000 Units by mouth daily.       Signature:  Chesley Mires, MD [place] Pager - [phone] 06/03/2020, 10:25 AM

## 2020-06-10 DIAGNOSIS — H16223 Keratoconjunctivitis sicca, not specified as Sjogren's, bilateral: Secondary | ICD-10-CM | POA: Diagnosis not present

## 2020-06-10 DIAGNOSIS — H543 Unqualified visual loss, both eyes: Secondary | ICD-10-CM | POA: Diagnosis not present

## 2020-06-10 DIAGNOSIS — H04123 Dry eye syndrome of bilateral lacrimal glands: Secondary | ICD-10-CM | POA: Diagnosis not present

## 2020-06-17 ENCOUNTER — Ambulatory Visit: Payer: Medicare Other

## 2020-06-17 ENCOUNTER — Other Ambulatory Visit: Payer: Self-pay

## 2020-06-17 DIAGNOSIS — G4733 Obstructive sleep apnea (adult) (pediatric): Secondary | ICD-10-CM | POA: Diagnosis not present

## 2020-06-17 DIAGNOSIS — R0683 Snoring: Secondary | ICD-10-CM

## 2020-06-18 ENCOUNTER — Telehealth: Payer: Self-pay | Admitting: Pulmonary Disease

## 2020-06-18 DIAGNOSIS — G4733 Obstructive sleep apnea (adult) (pediatric): Secondary | ICD-10-CM | POA: Diagnosis not present

## 2020-06-18 NOTE — Telephone Encounter (Signed)
Patient returned phone call, name and birth date confirmed. Went over [employer] results per Dr Halford Chessman with patient. All questions answered and patient expressed full understanding. Scheduled office visit with NP for Monday 06/22/2020 at [place] at the [place] office. Patient agreeable to time, date and location.

## 2020-06-18 NOTE — Telephone Encounter (Signed)
Called and left message on voicemail to please return phone call to go over HST results. Contact number provided.  

## 2020-06-18 NOTE — Telephone Encounter (Signed)
HST 06/17/20 >> AHI 64.6, SpO2 low 65%  Please inform her that her sleep study shows severe obstructive sleep apnea.  Please arrange for ROV with me or NP to discuss treatment options.

## 2020-06-22 ENCOUNTER — Telehealth: Payer: Self-pay | Admitting: Adult Health

## 2020-06-22 ENCOUNTER — Ambulatory Visit: Payer: Medicare Other | Admitting: Adult Health

## 2020-06-22 ENCOUNTER — Encounter: Payer: Self-pay | Admitting: Adult Health

## 2020-06-22 ENCOUNTER — Other Ambulatory Visit: Payer: Self-pay

## 2020-06-22 DIAGNOSIS — G4733 Obstructive sleep apnea (adult) (pediatric): Secondary | ICD-10-CM | POA: Diagnosis not present

## 2020-06-22 DIAGNOSIS — E663 Overweight: Secondary | ICD-10-CM | POA: Diagnosis not present

## 2020-06-22 NOTE — Assessment & Plan Note (Signed)
Healthy weight loss 

## 2020-06-22 NOTE — Progress Notes (Signed)
Reviewed and agree with assessment/plan.   Chesley Mires, MD [facility] 06/22/2020, 12:15 PM Pager:  [phone]

## 2020-06-22 NOTE — Progress Notes (Signed)
@Patient  ID: Tracy [name], female    DOB: Mar 10, 1950, 69 y.o.   MRN: [id number]  Chief Complaint  Patient presents with  . Follow-up    Referring provider: [name], MD  HPI: 70 year old female seen for sleep consult June 03, 2020 for snoring, daytime sleepiness and restless sleep set up for a home sleep study that showed severe obstructive sleep apnea Medical history significant for GERD, hyperlipidemia, hypothyroidism  TEST/EVENTS :  Home sleep study June 17, 2020 AHI 64.6, SPO2 low at 65%  06/22/2020 Follow up : OSA  Patient presents for a follow-up visit.  Patient was seen 2 weeks ago for a sleep consult.  She had snoring, daytime sleepiness and restless sleep.  She was set up for a home sleep study that showed severe obstructive sleep apnea.  Sleep study was done on June 17, 2020 that showed AHI at 64.6, SPO2 low at 65%.  Patient education was given on sleep apnea.  We went over treatment options including weight loss oral appliance and CPAP.  Given her severity of sleep apnea we discussed proceeding with CPAP. Discussed CPAP usage .   Lives with husband, independent . Drives . No sleepiness with driving .  Walks daily for 30 min .  Husband could not sleep in same room with her due to snoring.      Allergies  Allergen Reactions  . Augmentin [Amoxicillin-Pot Clavulanate] Diarrhea    Severe diarrhea  . Demerol [Meperidine] Nausea And Vomiting  . Latex Rash, Other (See Comments) and Hives    Reaction:  Blisters   . Other Itching    Nylon sheets, pt prefers cotton sheets   . Tramadol Nausea And Vomiting    Immunization History  Administered Date(s) Administered  . Influenza Whole 11/28/2012  . Influenza, High Dose Seasonal PF 11/10/2014, 10/26/2015, 11/09/2016, 10/31/2018, 11/20/2019  . Influenza,inj,Quad PF,6+ Mos 12/09/2013, 10/30/2017  . Influenza-Unspecified 10/31/2018  . PFIZER(Purple Top)SARS-COV-2 Vaccination 03/22/2019, 04/12/2019, 12/04/2019   . Pneumococcal Conjugate-13 09/08/2014  . Pneumococcal Polysaccharide-23 07/16/2013  . Tdap 06/28/2012    Past Medical History:  Diagnosis Date  . Allergy   . Anxiety   . Arthritis   . Bursitis of left hip   . C. difficile diarrhea   . Cancer (HCC)    basil cell  . Cataract    bilateral  . Complication of anesthesia   . Diverticulitis   . GERD (gastroesophageal reflux disease)   . Hx: UTI (urinary tract infection)   . Hyperlipidemia   . Hyperthyroidism   . Osteopenia   . Osteoporosis   . PONV (postoperative nausea and vomiting)   . Rosacea   . Rotator cuff arthropathy, left     Tobacco History: Social History   Tobacco Use  Smoking Status Never Smoker  Smokeless Tobacco Never Used   Counseling given: Not Answered   Outpatient Medications Prior to Visit  Medication Sig Dispense Refill  . Calcium Carbonate-Vitamin D (CALCIUM 600+D PO) Take 1 tablet by mouth 2 (two) times daily.    . Cholecalciferol (VITAMIN D) 2000 units tablet Take 2,000 Units by mouth daily.    . fenofibrate 160 MG tablet TAKE 1 TABLET BY MOUTH  DAILY 90 tablet 3  . fluticasone (FLONASE) 50 MCG/ACT nasal spray USE 1 SPRAY IN BOTH  NOSTRILS DAILY 32 g 3  . loteprednol (LOTEMAX) 0.5 % ophthalmic suspension Place 1 drop into both eyes in the morning, at noon, in the evening, and at bedtime.    Marland Kitchen  Melatonin 5 MG TABS Take 5 mg by mouth at bedtime.    . Multiple Vitamin (MULTIVITAMIN WITH MINERALS) TABS tablet Take 1 tablet by mouth daily.    . pantoprazole (PROTONIX) 40 MG tablet Take 1 tablet (40 mg total) by mouth 2 (two) times daily. 180 tablet 1  . polyethylene glycol (MIRALAX / GLYCOLAX) packet Take 17 g by mouth daily. 14 each 0  . RESTASIS 0.05 % ophthalmic emulsion Place 1 drop into both eyes 2 (two) times daily.     No facility-administered medications prior to visit.     Review of Systems:   Constitutional:   No  weight loss, night sweats,  Fevers, chills,  +fatigue, or   lassitude.  HEENT:   No headaches,  Difficulty swallowing,  Tooth/dental problems, or  Sore throat,                No sneezing, itching, ear ache, nasal congestion, post nasal drip,   CV:  No chest pain,  Orthopnea, PND, swelling in lower extremities, anasarca, dizziness, palpitations, syncope.   GI  No heartburn, indigestion, abdominal pain, nausea, vomiting, diarrhea, change in bowel habits, loss of appetite, bloody stools.   Resp:    No chest wall deformity  Skin: no rash or lesions.  GU: no dysuria, change in color of urine, no urgency or frequency.  No flank pain, no hematuria   MS:  No joint pain or swelling.  No decreased range of motion.  No back pain.    Physical Exam  BP 116/64 (BP Location: Left Arm, Cuff Size: Normal)   Pulse 72   Temp (!) 97.5 F (36.4 C) (Temporal)   Ht 5' (1.524 m)   Wt 148 lb 12.8 oz (67.5 kg)   SpO2 95% Comment: RA  BMI 29.06 kg/m   GEN: A/Ox3; pleasant , NAD, well nourished    HEENT:  [place]/AT,  , NOSE-clear, THROAT-clear, no lesions, no postnasal drip or exudate noted. Class 2 MP airway   NECK:  Supple w/ fair ROM; no JVD; normal carotid impulses w/o bruits; no thyromegaly or nodules palpated; no lymphadenopathy.    RESP  Clear  P & A; w/o, wheezes/ rales/ or rhonchi. no accessory muscle use, no dullness to percussion  CARD:  RRR, no m/r/g, no peripheral edema, pulses intact, no cyanosis or clubbing.  GI:   Soft & nt; nml bowel sounds; no organomegaly or masses detected.   Musco: Warm bil, no deformities or joint swelling noted.   Neuro: alert, no focal deficits noted.    Skin: Warm, no lesions or rashes    Lab Results:    BNP No results found for: BNP  ProBNP No results found for: PROBNP  Imaging: No results found.    No flowsheet data found.  No results found for: NITRICOXIDE      Assessment & Plan:   OSA (obstructive sleep apnea) Severe OSA , patient education on OSA and treatment options -  Will proceed  with CPAP   Plan  Patient Instructions  Begin CPAP at bedtime Wear your CPAP all night long for at least 4 to 6 hours or more. Work on healthy weight Do not drive if sleepy Follow-up in 2 to 3 months with Dr. Halford Chessman or Timmothy Baranowski NP and As needed        Overweight (BMI 25.0-29.9) Healthy weight loss      Rexene Edison, NP 06/22/2020

## 2020-06-22 NOTE — Telephone Encounter (Signed)
TP please advise if we can add to the order for the chin strap for the cpap machine.  Thanks

## 2020-06-22 NOTE — Telephone Encounter (Signed)
Sent order to my nurse Janett Billow to handle

## 2020-06-22 NOTE — Patient Instructions (Signed)
Begin CPAP at bedtime Wear your CPAP all night long for at least 4 to 6 hours or more. Work on healthy weight Do not drive if sleepy Follow-up in 2 to 3 months with Dr. Halford Chessman or Tiyanna Larcom NP and As needed

## 2020-06-22 NOTE — Addendum Note (Signed)
Addended by: Geanie Logan on: 06/22/2020 09:47 AM   Modules accepted: Orders

## 2020-06-22 NOTE — Assessment & Plan Note (Signed)
Severe OSA , patient education on OSA and treatment options -  Will proceed with CPAP   Plan  Patient Instructions  Begin CPAP at bedtime Wear your CPAP all night long for at least 4 to 6 hours or more. Work on healthy weight Do not drive if sleepy Follow-up in 2 to 3 months with Dr. Halford Chessman or Mikaya Bunner NP and As needed

## 2020-06-22 NOTE — Addendum Note (Signed)
Addended by: Geanie Logan on: 06/22/2020 12:34 PM   Modules accepted: Orders

## 2020-06-23 ENCOUNTER — Telehealth: Payer: Self-pay | Admitting: Adult Health

## 2020-06-23 NOTE — Telephone Encounter (Signed)
Called and spoke with pt and she stated that she was concerned about using [employer] since they have horrible reviews.  She is aware that we do have pts that use them and they are happy with the service.    She also wanted to make sure that the order got fixed to get her fitted for the nasal mask.  She is aware that the order was sent in for this as well.  Nothing further is needed.

## 2020-07-01 ENCOUNTER — Ambulatory Visit (INDEPENDENT_AMBULATORY_CARE_PROVIDER_SITE_OTHER): Payer: Medicare Other | Admitting: Family Medicine

## 2020-07-01 ENCOUNTER — Other Ambulatory Visit: Payer: Self-pay

## 2020-07-01 ENCOUNTER — Encounter: Payer: Self-pay | Admitting: Family Medicine

## 2020-07-01 VITALS — BP 120/70 | HR 62 | Temp 97.7°F | Resp 20 | Ht 61.24 in | Wt 146.2 lb

## 2020-07-01 DIAGNOSIS — K5792 Diverticulitis of intestine, part unspecified, without perforation or abscess without bleeding: Secondary | ICD-10-CM

## 2020-07-01 MED ORDER — METRONIDAZOLE 500 MG PO TABS: 500.0000 mg | ORAL_TABLET | Freq: Three times a day (TID) | ORAL | 0 refills | Status: AC

## 2020-07-01 MED ORDER — CIPROFLOXACIN HCL 500 MG PO TABS: 500.0000 mg | ORAL_TABLET | Freq: Two times a day (BID) | ORAL | 0 refills | Status: DC

## 2020-07-01 NOTE — Progress Notes (Signed)
   Subjective:    Patient ID: Tracy [name], female    DOB: 07/12/1950, 70 y.o.   MRN: [id number]  HPI abd pain- pt reports feeling 'bloated and having a lot of gas'.  Having L sided abd pain.  'it feels swollen and it burns'.  'it feels like someone has been sticking a hot poker in me'.  Sxs started Saturday.  Painful to touch.  No rash present.  No nausea or vomiting.  BMs have been 'looser'.  + decreased appetite.  No burning w/ urination.   Review of Systems For ROS see HPI   This visit occurred during the SARS-CoV-2 public health emergency.  Safety protocols were in place, including screening questions prior to the visit, additional usage of staff PPE, and extensive cleaning of exam room while observing appropriate contact time as indicated for disinfecting solutions.       Objective:   Physical Exam Vitals reviewed.  Constitutional:      General: She is not in acute distress.    Appearance: Normal appearance. She is not ill-appearing.  HENT:     Head: Normocephalic and atraumatic.  Cardiovascular:     Rate and Rhythm: Normal rate and regular rhythm.     Pulses: Normal pulses.  Pulmonary:     Effort: Pulmonary effort is normal.     Breath sounds: Normal breath sounds.  Abdominal:     General: Bowel sounds are normal.     Palpations: Abdomen is soft.     Tenderness: There is abdominal tenderness (TTP over L mid abdomen). There is guarding (voluntary guarding). There is no rebound.  Skin:    General: Skin is warm and dry.     Findings: No rash.  Neurological:     General: No focal deficit present.     Mental Status: She is alert and oriented to person, place, and time.  Psychiatric:        Mood and Affect: Mood normal.        Behavior: Behavior normal.        Thought Content: Thought content normal.           Assessment & Plan:  Diverticulitis- given pt's hx of recurrent diverticulitis and hx of perforation will start tx w/ Cipro and Flagyl.  She is to notify  immediately if sxs fail to improve or are worsening.  At that time she would need CT scan.  Reviewed supportive care and red flags that should prompt return.  Pt expressed understanding and is in agreement w/ plan.

## 2020-07-01 NOTE — Patient Instructions (Signed)
Follow up as needed or as scheduled START the Cipro twice daily and the Flagyl 3x/day for the next 7 days Drink LOTS of fluids Stick with a liquid-ish diet- broth, jello, apple sauce, etc- until pain is improving Call with any questions or concerns- particularly if not improving Hang in there!!!

## 2020-07-03 ENCOUNTER — Encounter (HOSPITAL_COMMUNITY): Payer: Self-pay | Admitting: Emergency Medicine

## 2020-07-03 ENCOUNTER — Emergency Department (HOSPITAL_BASED_OUTPATIENT_CLINIC_OR_DEPARTMENT_OTHER)
Admission: RE | Admit: 2020-07-03 | Discharge: 2020-07-03 | Disposition: A | Discharge status: Home / Self Care | Payer: Medicare Other | Source: Ambulatory Visit | Attending: Family Medicine | Admitting: Family Medicine

## 2020-07-03 ENCOUNTER — Other Ambulatory Visit: Payer: Self-pay

## 2020-07-03 ENCOUNTER — Encounter: Payer: Self-pay | Admitting: Family Medicine

## 2020-07-03 ENCOUNTER — Other Ambulatory Visit (INDEPENDENT_AMBULATORY_CARE_PROVIDER_SITE_OTHER): Payer: Medicare Other

## 2020-07-03 ENCOUNTER — Emergency Department (HOSPITAL_COMMUNITY)
Admission: EM | Admit: 2020-07-03 | Discharge: 2020-07-04 | Disposition: A | Discharge status: Home / Self Care | Payer: Medicare Other | Attending: Emergency Medicine | Admitting: Emergency Medicine

## 2020-07-03 DIAGNOSIS — E278 Other specified disorders of adrenal gland: Secondary | ICD-10-CM | POA: Diagnosis not present

## 2020-07-03 DIAGNOSIS — Z85828 Personal history of other malignant neoplasm of skin: Secondary | ICD-10-CM | POA: Insufficient documentation

## 2020-07-03 DIAGNOSIS — D3500 Benign neoplasm of unspecified adrenal gland: Secondary | ICD-10-CM | POA: Diagnosis not present

## 2020-07-03 DIAGNOSIS — R197 Diarrhea, unspecified: Secondary | ICD-10-CM | POA: Insufficient documentation

## 2020-07-03 DIAGNOSIS — R1032 Left lower quadrant pain: Secondary | ICD-10-CM

## 2020-07-03 DIAGNOSIS — Z91040 Latex allergy status: Secondary | ICD-10-CM | POA: Insufficient documentation

## 2020-07-03 DIAGNOSIS — K760 Fatty (change of) liver, not elsewhere classified: Secondary | ICD-10-CM | POA: Diagnosis not present

## 2020-07-03 DIAGNOSIS — Z96651 Presence of right artificial knee joint: Secondary | ICD-10-CM | POA: Insufficient documentation

## 2020-07-03 DIAGNOSIS — K5790 Diverticulosis of intestine, part unspecified, without perforation or abscess without bleeding: Secondary | ICD-10-CM | POA: Diagnosis not present

## 2020-07-03 DIAGNOSIS — R1012 Left upper quadrant pain: Secondary | ICD-10-CM | POA: Insufficient documentation

## 2020-07-03 LAB — COMPREHENSIVE METABOLIC PANEL
ALT: 19 U/L (ref 0–44)
AST: 30 U/L (ref 15–41)
Albumin: 4.4 g/dL (ref 3.5–5.0)
Alkaline Phosphatase: 33 U/L — ABNORMAL LOW (ref 38–126)
Anion gap: 12 (ref 5–15)
BUN: 14 mg/dL (ref 8–23)
CO2: 23 mmol/L (ref 22–32)
Calcium: 9.6 mg/dL (ref 8.9–10.3)
Chloride: 109 mmol/L (ref 98–111)
Creatinine, Ser: 1.04 mg/dL — ABNORMAL HIGH (ref 0.44–1.00)
GFR, Estimated: 57 mL/min — ABNORMAL LOW (ref >60)
Glucose, Bld: 96 mg/dL (ref 70–99)
Potassium: 3.8 mmol/L (ref 3.5–5.1)
Sodium: 144 mmol/L (ref 135–145)
Total Bilirubin: 0.6 mg/dL (ref 0.3–1.2)
Total Protein: 7.6 g/dL (ref 6.5–8.1)

## 2020-07-03 LAB — CBC WITH DIFFERENTIAL/PLATELET
Abs Immature Granulocytes: 0.02 10*3/uL (ref 0.00–0.07)
Basophils Absolute: 0.1 10*3/uL (ref 0.0–0.1)
Basophils Relative: 1 %
Eosinophils Absolute: 0.1 10*3/uL (ref 0.0–0.5)
Eosinophils Relative: 2 %
HCT: 43 % (ref 36.0–46.0)
Hemoglobin: 14.1 g/dL (ref 12.0–15.0)
Immature Granulocytes: 0 %
Lymphocytes Relative: 30 %
Lymphs Abs: 1.7 10*3/uL (ref 0.7–4.0)
MCH: 30 pg (ref 26.0–34.0)
MCHC: 32.8 g/dL (ref 30.0–36.0)
MCV: 91.5 fL (ref 80.0–100.0)
Monocytes Absolute: 0.6 10*3/uL (ref 0.1–1.0)
Monocytes Relative: 11 %
Neutro Abs: 3.1 10*3/uL (ref 1.7–7.7)
Neutrophils Relative %: 56 %
Platelets: 258 10*3/uL (ref 150–400)
RBC: 4.7 MIL/uL (ref 3.87–5.11)
RDW: 12.6 % (ref 11.5–15.5)
WBC: 5.6 10*3/uL (ref 4.0–10.5)
nRBC: 0 % (ref 0.0–0.2)

## 2020-07-03 LAB — C DIFFICILE QUICK SCREEN W PCR REFLEX
C Diff antigen: NEGATIVE
C Diff interpretation: NEGATIVE
C Diff toxin: NEGATIVE

## 2020-07-03 LAB — BASIC METABOLIC PANEL
BUN: 14 mg/dL (ref 6–23)
CO2: 27 mEq/L (ref 19–32)
Calcium: 9.7 mg/dL (ref 8.4–10.5)
Chloride: 104 mEq/L (ref 96–112)
Creatinine, Ser: 0.99 mg/dL (ref 0.40–1.20)
GFR: 57.07 mL/min — ABNORMAL LOW (ref >60.00)
Glucose, Bld: 92 mg/dL (ref 70–99)
Potassium: 4 mEq/L (ref 3.5–5.1)
Sodium: 140 mEq/L (ref 135–145)

## 2020-07-03 LAB — LIPASE, BLOOD: Lipase: 30 U/L (ref 11–51)

## 2020-07-03 MED ORDER — IOHEXOL 300 MG/ML  SOLN
100.0000 mL | Freq: Once | INTRAMUSCULAR | Status: AC
  Administered 2020-07-03: 100 mL via INTRAVENOUS

## 2020-07-03 NOTE — ED Provider Notes (Signed)
Emergency Medicine Provider Triage Evaluation Note  Tracy [name] , a 70 y.o. female  was evaluated in triage.  Pt complains of diarrhea.  Review of Systems  Positive: LUQ abd pain, diarrhea Negative: Fever, vomit, hematochezia, melena  Physical Exam  BP 120/90 (BP Location: Right Arm)   Pulse 88   Temp 98.4 F (36.9 C) (Oral)   Resp 16   Ht 5\' 1"  (1.549 m)   Wt 65.3 kg   SpO2 100%   BMI 27.21 kg/m  Gen:   Awake, no distress   Resp:  Normal effort  MSK:   Moves extremities without difficulty  Other:  Mild LUQ tenderness  Medical Decision Making  Medically screening exam initiated at 9:02 PM.  Appropriate orders placed.  [name] was informed that the remainder of the evaluation will be completed by another provider, this initial triage assessment does not replace that evaluation, and the importance of remaining in the ED until their evaluation is complete.  abd pain x 3-4 days, pcp started cipro/flagyl, now having recurrent diarrhea today while getting outpt abd/pelvis CT scan.  Hx of C.diff, as well as diverticular disease.    Domenic Moras, PA-C 07/03/20 2104    Sherwood Gambler, MD 07/03/20 2352

## 2020-07-03 NOTE — ED Provider Notes (Signed)
[facility] DEPT Provider Note: Georgena Spurling, MD, FACEP  CSN: [id number] MRN: [id number] ARRIVAL: 07/03/20 at 2049 ROOM: WA11/WA11   CHIEF COMPLAINT  Diarrhea   HISTORY OF PRESENT ILLNESS  07/03/20 10:56 PM Tracy [name] is a 70 y.o. female with a history of diverticulitis, ischemic colitis and C. difficile colitis.  She has had left upper quadrant, burning abdominal pain for the past 6 days.  She rates the pain as a 7 out of 10 at its worst.  Pain is worse with movement or palpation.  She contacted her PCP whom she saw 2 days ago.  She was started on Cipro and Flagyl for suspected diverticulitis.  She has had diverticulitis but previously was in the left lower quadrant.  Her pain did not resolve so she had a CT of the abdomen and pelvis performed at 4 PM today.  The results are pending.  After the CT she went home and started having profuse, watery, clear diarrhea which she states is like that of previous C. difficile infection.  She has not had a fever, nausea or vomiting.  The CT was read urgently at my request and showed fatty liver, stable left adrenal adenoma and diverticulosis without diverticulitis.  No evidence of obstruction or inflammatory changes of the colon.   Past Medical History:  Diagnosis Date  . Allergy   . Anxiety   . Arthritis   . Bursitis of left hip   . C. difficile diarrhea   . Cancer (HCC)    basil cell  . Cataract    bilateral  . Complication of anesthesia   . Diverticulitis   . GERD (gastroesophageal reflux disease)   . Hx: UTI (urinary tract infection)   . Hyperlipidemia   . Hyperthyroidism   . Osteopenia   . Osteoporosis   . PONV (postoperative nausea and vomiting)   . Rosacea   . Rotator cuff arthropathy, left     Past Surgical History:  Procedure Laterality Date  . APPENDECTOMY    . BACK SURGERY    . CATARACT EXTRACTION, BILATERAL  11/2018  . CHOLECYSTECTOMY    . FOOT SURGERY Right    X 2  . KNEE SURGERY    . LAPAROSCOPIC  LIVER CYST FENESTRATION    . OPEN SURGICAL REPAIR OF GLUTEAL TENDON Left 04/01/2014   Procedure: LEFT HIP BURSECTOMY WITH GLUTEAL TENDON REPAIR;  Surgeon: Gearlean Alf, MD;  Location: [facility];  Service: Orthopedics;  Laterality: Left;  . REVERSE SHOULDER ARTHROPLASTY Right 06/23/2017   Procedure: RIGHT REVERSE SHOULDER ARTHROPLASTY;  Surgeon: Netta Cedars, MD;  Location: [facility];  Service: Orthopedics;  Laterality: Right;  . TONSILLECTOMY    . TOTAL KNEE ARTHROPLASTY Right 08/17/2015   Procedure: RIGHT TOTAL KNEE ARTHROPLASTY;  Surgeon: Gaynelle Arabian, MD;  Location: [facility];  Service: Orthopedics;  Laterality: Right;  . TUBAL LIGATION      Family History  Problem Relation Age of Onset  . Hypertension Mother   . Stroke Mother   . Liver disease Father        alcohol related  . Bone cancer Paternal Grandfather   . Cancer Neg Hx   . Diabetes Neg Hx   . Colon cancer Neg Hx   . Esophageal cancer Neg Hx   . Pancreatic cancer Neg Hx   . Stomach cancer Neg Hx     Social History   Tobacco Use  . Smoking status: Never Smoker  . Smokeless tobacco: Never Used  Vaping Use  .  Vaping Use: Never used  Substance Use Topics  . Alcohol use: No  . Drug use: No    Prior to Admission medications   Medication Sig Start Date End Date Taking? Authorizing Provider  Calcium Carbonate-Vitamin D (CALCIUM 600+D PO) Take 1 tablet by mouth 2 (two) times daily.    [provider]  Cholecalciferol (VITAMIN D) 2000 units tablet Take 2,000 Units by mouth daily.    [provider]  ciprofloxacin (CIPRO) 500 MG tablet Take 1 tablet (500 mg total) by mouth 2 (two) times daily. 07/01/20   [name], MD  fenofibrate 160 MG tablet TAKE 1 TABLET BY MOUTH  DAILY 02/25/20   [name], MD  fluticasone [facility]) 50 MCG/ACT nasal spray USE 1 SPRAY IN BOTH  NOSTRILS DAILY 05/20/20   [name], MD  loteprednol (LOTEMAX) 0.5 % ophthalmic suspension Place 1 drop into both eyes in the  morning, at noon, in the evening, and at bedtime. 07/06/19   [provider]  Melatonin 5 MG TABS Take 5 mg by mouth at bedtime.    [provider]  metroNIDAZOLE (FLAGYL) 500 MG tablet Take 1 tablet (500 mg total) by mouth 3 (three) times daily for 7 days. 07/01/20 07/08/20  [name], MD  Multiple Vitamin (MULTIVITAMIN WITH MINERALS) TABS tablet Take 1 tablet by mouth daily.    [provider]  pantoprazole (PROTONIX) 40 MG tablet Take 1 tablet (40 mg total) by mouth 2 (two) times daily. 01/20/20   [name], MD  polyethylene glycol (MIRALAX / Floria Raveling) packet Take 17 g by mouth daily. 08/29/16   Brunetta Jeans, PA-C    Allergies Augmentin [amoxicillin-pot clavulanate], Demerol [meperidine], Latex, Other, and Tramadol   REVIEW OF SYSTEMS  Negative except as noted here or in the History of Present Illness.   PHYSICAL EXAMINATION  Initial Vital Signs Blood pressure 137/69, pulse 77, temperature 98.4 F (36.9 C), temperature source Oral, resp. rate 16, height 5\' 1"  (1.549 m), weight 65.3 kg, SpO2 99 %.  Examination General: Well-developed, well-nourished female in no acute distress; appearance consistent with age of record HENT: normocephalic; atraumatic Eyes: pupils equal, round and reactive to light; extraocular muscles intact; bilateral pseudophakia Neck: supple Heart: regular rate and rhythm Lungs: clear to auscultation bilaterally Abdomen: soft; nondistended; left upper quadrant tenderness; bowel sounds present Extremities: No deformity; full range of motion; pulses normal Neurologic: Awake, alert and oriented; motor function intact in all extremities and symmetric; no facial droop Skin: Warm and dry Psychiatric: Normal mood and affect   RESULTS  Summary of this visit's results, reviewed and interpreted by myself:   EKG Interpretation  Date/Time:    Ventricular Rate:    PR Interval:    QRS Duration:   QT Interval:    QTC  Calculation:   R Axis:     Text Interpretation:        Laboratory Studies: Results for orders placed or performed during the hospital encounter of 07/03/20 (from the past 24 hour(s))  CBC with Differential     Status: None   Collection Time: 07/03/20  9:03 PM  Result Value Ref Range   WBC 5.6 4.0 - 10.5 K/uL   RBC 4.70 3.87 - 5.11 MIL/uL   Hemoglobin 14.1 12.0 - 15.0 g/dL   HCT 43.0 36.0 - 46.0 %   MCV 91.5 80.0 - 100.0 fL   MCH 30.0 26.0 - 34.0 pg   MCHC 32.8 30.0 - 36.0 g/dL  RDW 12.6 11.5 - 15.5 %   Platelets 258 150 - 400 K/uL   nRBC 0.0 0.0 - 0.2 %   Neutrophils Relative % 56 %   Neutro Abs 3.1 1.7 - 7.7 K/uL   Lymphocytes Relative 30 %   Lymphs Abs 1.7 0.7 - 4.0 K/uL   Monocytes Relative 11 %   Monocytes Absolute 0.6 0.1 - 1.0 K/uL   Eosinophils Relative 2 %   Eosinophils Absolute 0.1 0.0 - 0.5 K/uL   Basophils Relative 1 %   Basophils Absolute 0.1 0.0 - 0.1 K/uL   Immature Granulocytes 0 %   Abs Immature Granulocytes 0.02 0.00 - 0.07 K/uL  Comprehensive metabolic panel     Status: Abnormal   Collection Time: 07/03/20  9:03 PM  Result Value Ref Range   Sodium 144 135 - 145 mmol/L   Potassium 3.8 3.5 - 5.1 mmol/L   Chloride 109 98 - 111 mmol/L   CO2 23 22 - 32 mmol/L   Glucose, Bld 96 70 - 99 mg/dL   BUN 14 8 - 23 mg/dL   Creatinine, Ser 1.04 (H) 0.44 - 1.00 mg/dL   Calcium 9.6 8.9 - 10.3 mg/dL   Total Protein 7.6 6.5 - 8.1 g/dL   Albumin 4.4 3.5 - 5.0 g/dL   AST 30 15 - 41 U/L   ALT 19 0 - 44 U/L   Alkaline Phosphatase 33 (L) 38 - 126 U/L   Total Bilirubin 0.6 0.3 - 1.2 mg/dL   GFR, Estimated 57 (L) >60 mL/min   Anion gap 12 5 - 15  Lipase, blood     Status: None   Collection Time: 07/03/20  9:03 PM  Result Value Ref Range   Lipase 30 11 - 51 U/L  Urinalysis, Routine w reflex microscopic Urine, Clean Catch     Status: Abnormal   Collection Time: 07/03/20  9:03 PM  Result Value Ref Range   Color, Urine YELLOW YELLOW   APPearance CLEAR CLEAR    Specific Gravity, Urine 1.023 1.005 - 1.030   pH 5.0 5.0 - 8.0   Glucose, UA NEGATIVE NEGATIVE mg/dL   Hgb urine dipstick NEGATIVE NEGATIVE   Bilirubin Urine NEGATIVE NEGATIVE   Ketones, ur NEGATIVE NEGATIVE mg/dL   Protein, ur NEGATIVE NEGATIVE mg/dL   Nitrite NEGATIVE NEGATIVE   Leukocytes,Ua TRACE (A) NEGATIVE   RBC / HPF 0-5 0 - 5 RBC/hpf   WBC, UA 0-5 0 - 5 WBC/hpf   Bacteria, UA NONE SEEN NONE SEEN   Mucus PRESENT   C Difficile Quick Screen w PCR reflex     Status: None   Collection Time: 07/03/20  9:03 PM   Specimen: STOOL  Result Value Ref Range   C Diff antigen NEGATIVE NEGATIVE   C Diff toxin NEGATIVE NEGATIVE   C Diff interpretation NEGATIVE    Imaging Studies: CT Abdomen Pelvis W Contrast  Result Date: 07/03/2020 CLINICAL DATA:  Left lower quadrant pain EXAM: CT ABDOMEN AND PELVIS WITH CONTRAST TECHNIQUE: Multidetector CT imaging of the abdomen and pelvis was performed using the standard protocol following bolus administration of intravenous contrast. CONTRAST:  124mL OMNIPAQUE IOHEXOL 300 MG/ML  SOLN COMPARISON:  02/26/2019 FINDINGS: Lower chest: No acute abnormality. Hepatobiliary: Fatty infiltration of the liver is noted. Scattered small hypodensities are noted consistent with cysts stable in appearance from the prior exam. Gallbladder has been surgically removed. Pancreas: Unremarkable. No pancreatic ductal dilatation or surrounding inflammatory changes. Spleen: Normal in size without focal abnormality. Adrenals/Urinary Tract:  Right adrenal gland is within normal limits. Left adrenal lesion is noted and stable consistent with adrenal adenoma. Kidneys demonstrate a normal enhancement pattern. No renal calculi or obstructive changes are noted. Normal excretion is noted on delayed images. The ureters are within normal limits. Bladder is decompressed. Stomach/Bowel: Scattered diverticular changes noted without evidence of diverticulitis. No obstructive or inflammatory changes of  the colon are noted. Small bowel and stomach are within normal limits. Vascular/Lymphatic: Aortic atherosclerosis. No enlarged abdominal or pelvic lymph nodes. Reproductive: Status post hysterectomy. No adnexal masses. Other: No abdominal wall hernia or abnormality. No abdominopelvic ascites. Musculoskeletal: No acute or significant osseous findings. IMPRESSION: Fatty liver. Stable left adrenal adenoma. Diverticulosis without diverticulitis. Electronically Signed   By: Inez Catalina M.D.   On: 07/03/2020 22:56    ED COURSE and MDM  Nursing notes, initial and subsequent vitals signs, including pulse oximetry, reviewed and interpreted by myself.  Vitals:   07/04/20 0130 07/04/20 0200 07/04/20 0230 07/04/20 0330  BP: 119/60 119/74 110/63 (!) 102/57  Pulse: 68 69 69 63  Resp: 15 14 16 15   Temp:      TempSrc:      SpO2: 96% 95% 93% 96%  Weight:      Height:       Medications - No data to display  3:42 AM Patient's C. difficile screen is negative.  Her comprehensive GI panel by PCR is pending.  She declines analgesics.  The cause of her diarrhea is unclear but may be related to the left upper quadrant pain as it is significantly higher than I would expect for bowel pain.  She was advised of reassuring CT scan.  PROCEDURES  Procedures   ED DIAGNOSES     ICD-10-CM   1. Diarrhea of presumed infectious origin  R19.7   2. LUQ abdominal pain  R10.12        Shanon Rosser, MD 07/04/20 [id number]

## 2020-07-03 NOTE — ED Triage Notes (Signed)
Pt reports a hx of diverticulitis, c diff, and ischemic colitis. States that abdominal pain started on Saturday.  She is on cipro and flagyl and a clear liquid diet since Wednesday. Reports that her symptoms have not improved. She had a CT done around 4p today, but has not had any results because everyone went home for the weekend. States that today, she started experiencing severe diarrhea. She states that it feels like when she had c diff.

## 2020-07-03 NOTE — ED Notes (Signed)
Called lab, they need more stool to get the next gastrointestinal panel by PCR.

## 2020-07-04 LAB — GASTROINTESTINAL PANEL BY PCR, STOOL (REPLACES STOOL CULTURE)

## 2020-07-04 LAB — URINALYSIS, ROUTINE W REFLEX MICROSCOPIC
Bacteria, UA: NONE SEEN
Bilirubin Urine: NEGATIVE
Glucose, UA: NEGATIVE mg/dL
Hgb urine dipstick: NEGATIVE
Ketones, ur: NEGATIVE mg/dL
Nitrite: NEGATIVE
Protein, ur: NEGATIVE mg/dL
Specific Gravity, Urine: 1.023 (ref 1.005–1.030)
pH: 5 (ref 5.0–8.0)

## 2020-07-06 ENCOUNTER — Ambulatory Visit (HOSPITAL_BASED_OUTPATIENT_CLINIC_OR_DEPARTMENT_OTHER): Payer: Medicare Other

## 2020-07-08 DIAGNOSIS — H02883 Meibomian gland dysfunction of right eye, unspecified eyelid: Secondary | ICD-10-CM | POA: Diagnosis not present

## 2020-07-08 DIAGNOSIS — Z961 Presence of intraocular lens: Secondary | ICD-10-CM | POA: Diagnosis not present

## 2020-07-08 DIAGNOSIS — H04123 Dry eye syndrome of bilateral lacrimal glands: Secondary | ICD-10-CM | POA: Diagnosis not present

## 2020-07-08 DIAGNOSIS — H02886 Meibomian gland dysfunction of left eye, unspecified eyelid: Secondary | ICD-10-CM | POA: Diagnosis not present

## 2020-07-08 DIAGNOSIS — H26492 Other secondary cataract, left eye: Secondary | ICD-10-CM | POA: Diagnosis not present

## 2020-08-05 ENCOUNTER — Other Ambulatory Visit: Payer: Self-pay | Admitting: Family Medicine

## 2020-08-05 DIAGNOSIS — H26492 Other secondary cataract, left eye: Secondary | ICD-10-CM | POA: Diagnosis not present

## 2020-08-05 DIAGNOSIS — H04123 Dry eye syndrome of bilateral lacrimal glands: Secondary | ICD-10-CM | POA: Diagnosis not present

## 2020-08-05 DIAGNOSIS — H02883 Meibomian gland dysfunction of right eye, unspecified eyelid: Secondary | ICD-10-CM | POA: Diagnosis not present

## 2020-08-05 DIAGNOSIS — H02886 Meibomian gland dysfunction of left eye, unspecified eyelid: Secondary | ICD-10-CM | POA: Diagnosis not present

## 2020-08-26 ENCOUNTER — Ambulatory Visit (INDEPENDENT_AMBULATORY_CARE_PROVIDER_SITE_OTHER): Payer: Medicare Other

## 2020-08-26 ENCOUNTER — Ambulatory Visit: Payer: Medicare Other

## 2020-08-26 ENCOUNTER — Encounter: Payer: Self-pay | Admitting: *Deleted

## 2020-08-26 ENCOUNTER — Other Ambulatory Visit: Payer: Self-pay

## 2020-08-26 DIAGNOSIS — M810 Age-related osteoporosis without current pathological fracture: Secondary | ICD-10-CM | POA: Diagnosis not present

## 2020-08-26 MED ORDER — DENOSUMAB 60 MG/ML ~~LOC~~ SOSY
60.0000 mg | PREFILLED_SYRINGE | Freq: Once | SUBCUTANEOUS | Status: AC
  Administered 2020-08-26: 60 mg via SUBCUTANEOUS

## 2020-08-26 NOTE — Progress Notes (Signed)
Patient presents to the office to receive her prolia injection. Patient received 59ml denosumab prolia  in her left arm and tolerated well. Patient will return in 6 months to get her next dose of the prolia injection.

## 2020-10-17 DIAGNOSIS — G4733 Obstructive sleep apnea (adult) (pediatric): Secondary | ICD-10-CM | POA: Diagnosis not present

## 2020-10-21 ENCOUNTER — Telehealth: Payer: Self-pay

## 2020-10-21 NOTE — Telephone Encounter (Signed)
I called and spoke with the pt to verify her address. The HST results have been mailed to her. I advised she contact DME to troubleshoot mask issue and call us back if needed. Nothing further needed.

## 2020-10-21 NOTE — Chronic Care Management (AMB) (Signed)
    Chronic Care Management Pharmacy Assistant   Name: ISAMARY STEINBRINK  MRN: [id number] DOB: Jul 07, 1950   Reason for Encounter: Chart Review / PAP update   Chart review performed. Per note from CPA on 04/27/20 : I called my [employer] Assist Program to inquire status on application for medication Restasis. I was told this was not sent to their program. I called and spoke with the patient, she does not wish to proceed with this application at this time.  Updated panel and noted to discuss if patient is still interested in pursuing the PAP for Restasis at her next scheduled follow up with CPP in September.     Jobe Gibbon, [place] Pharmacist Assistant  [phone]  Time Spent: 10 minutes

## 2020-10-26 ENCOUNTER — Ambulatory Visit: Payer: Medicare Other | Admitting: Pulmonary Disease

## 2020-10-27 DIAGNOSIS — Z1231 Encounter for screening mammogram for malignant neoplasm of breast: Secondary | ICD-10-CM | POA: Diagnosis not present

## 2020-10-27 LAB — HM MAMMOGRAPHY

## 2020-10-29 ENCOUNTER — Encounter: Payer: Self-pay | Admitting: Family Medicine

## 2020-10-29 DIAGNOSIS — L821 Other seborrheic keratosis: Secondary | ICD-10-CM | POA: Diagnosis not present

## 2020-10-29 DIAGNOSIS — Z85828 Personal history of other malignant neoplasm of skin: Secondary | ICD-10-CM | POA: Diagnosis not present

## 2020-10-29 DIAGNOSIS — C44519 Basal cell carcinoma of skin of other part of trunk: Secondary | ICD-10-CM | POA: Diagnosis not present

## 2020-10-29 DIAGNOSIS — L718 Other rosacea: Secondary | ICD-10-CM | POA: Diagnosis not present

## 2020-10-30 DIAGNOSIS — G4733 Obstructive sleep apnea (adult) (pediatric): Secondary | ICD-10-CM | POA: Diagnosis not present

## 2020-11-05 ENCOUNTER — Encounter: Payer: Self-pay | Admitting: Family Medicine

## 2020-11-13 ENCOUNTER — Encounter: Payer: Self-pay | Admitting: Family Medicine

## 2020-11-17 DIAGNOSIS — G4733 Obstructive sleep apnea (adult) (pediatric): Secondary | ICD-10-CM | POA: Diagnosis not present

## 2020-11-23 ENCOUNTER — Ambulatory Visit: Payer: Medicare Other | Admitting: Pulmonary Disease

## 2020-11-23 ENCOUNTER — Other Ambulatory Visit: Payer: Self-pay

## 2020-11-23 ENCOUNTER — Encounter: Payer: Self-pay | Admitting: Pulmonary Disease

## 2020-11-23 VITALS — BP 112/68 | Temp 97.7°F | Ht 64.0 in | Wt 148.2 lb

## 2020-11-23 DIAGNOSIS — Z789 Other specified health status: Secondary | ICD-10-CM

## 2020-11-23 DIAGNOSIS — G4733 Obstructive sleep apnea (adult) (pediatric): Secondary | ICD-10-CM | POA: Diagnosis not present

## 2020-11-23 NOTE — Patient Instructions (Signed)
Will arrange for refitting of your CPAP mask at the sleep lab  Check with your dentist about an oral appliance for sleep apnea  Will arrange for referral to ENT to discuss option of an Inspire device to treat sleep apnea  Follow up in 2 months

## 2020-11-23 NOTE — Progress Notes (Signed)
[facility], [facility] Sleep [facility]  Chief Complaint  Patient presents with   Follow-up    Irritated nose, started CPAP therapy September 1st.    Constitutional:  BP 112/68 (Patient Position: Sitting)   Temp 97.7 F (36.5 C)   Ht 5\' 4"  (1.626 m)   Wt 148 lb 3.2 oz (67.2 kg)   SpO2 99%   BMI 25.44 kg/m   Past Medical History:  Allergies, Anxiety, OA, C diff, Cataract, Diverticulitis, GERD, HH, HLD, Hypothyroidism, Osteopenia, Osteoporosis, Rosacea  Past Surgical History:  She  has a past surgical history that includes Knee surgery; Back surgery; Laparoscopic liver cyst fenestration; Foot surgery (Right); Cholecystectomy; Tonsillectomy; Appendectomy; Tubal ligation; Open surgical repair of gluteal tendon (Left, 04/01/2014); Total knee arthroplasty (Right, 08/17/2015); Reverse shoulder arthroplasty (Right, 06/23/2017); and Cataract extraction, bilateral (11/2018).  Brief Summary:  Tracy [name] is a 70 y.o. female with obstructive sleep apnea.      Subjective:   She had home sleep study in April.  Showed severe  sleep apnea.  Started on auto CPAP earlier this month.  She isn't waking up during the night anymore.  Her main issue is that she can't tolerate using the mask.  She has nasal cushion mask.  She has developed sores around her nose and her nose feels swollen.  She wants to know if there are other options for sleep apnea.  Physical Exam:   Appearance - well kempt   ENMT - no sinus tenderness, no oral exudate, no LAN, Mallampati 2 airway, no stridor, decreased AP diameter  Respiratory - equal breath sounds bilaterally, no wheezing or rales  CV - s1s2 regular rate and rhythm, no murmurs  Ext - no clubbing, no edema  Skin - no rashes  Psych - normal mood and affect   Sleep Tests:  HST 06/17/20 >> AHI 64.6, SpO2 low 65% Auto CPAP 10/29/20 to 11/19/20 >> used on 22 of 22 nights with average 7 hrs 17 min.  Average AHI 3.7 with median CPAP 6 and 95  th percentile CPAP 8 cm H2O  Cardiac Tests:  Echo 10/17/18 >> EF 60 to 65%  Social History:  She  reports that she has never smoked. She has never used smokeless tobacco. She reports that she does not drink alcohol and does not use drugs.  Family History:  Her family history includes Bone cancer in her paternal grandfather; Hypertension in her mother; Liver disease in her father; Stroke in her mother.     Assessment/Plan:   Obstructive sleep apnea. - reviewed her sleep study results - discussed options for treating sleep apnea - will arrange for sleep lab to refit her CPAP mask; advised her to not resume CPAP until she gets a different mask - she will check with her dentist about whether she would be a candidate for an oral appliance - will have her referred to ENT to discuss whether she is a candidate for an Inspire device - discussed positional therapy as an added option for sleep apnea   Time Spent Involved in Patient Care on Day of Examination:  34 minutes  Follow up:   Patient Instructions  Will arrange for refitting of your CPAP mask at the sleep lab  Check with your dentist about an oral appliance for sleep apnea  Will arrange for referral to ENT to discuss option of an Inspire device to treat sleep apnea  Follow up in 2 months  Medication List:   Allergies as of 11/23/2020  Reactions   Augmentin [amoxicillin-pot Clavulanate] Diarrhea   Severe diarrhea   Demerol [meperidine] Nausea And Vomiting   Latex Rash, Other (See Comments), Hives   Reaction:  Blisters    Other Itching   Nylon sheets, pt prefers cotton sheets    Tramadol Nausea And Vomiting        Medication List        Accurate as of November 23, 2020  1:03 PM. If you have any questions, ask your nurse or doctor.          STOP taking these medications    ciprofloxacin 500 MG tablet Commonly known as: Cipro Stopped by: Chesley Mires, MD   loteprednol 0.5 % ophthalmic  suspension Commonly known as: LOTEMAX Stopped by: Chesley Mires, MD       TAKE these medications    CALCIUM 600+D PO Take 1 tablet by mouth 2 (two) times daily.   fenofibrate 160 MG tablet TAKE 1 TABLET BY MOUTH  DAILY   fluticasone 50 MCG/ACT nasal spray Commonly known as: FLONASE USE 1 SPRAY IN BOTH  NOSTRILS DAILY   melatonin 5 MG Tabs Take 5 mg by mouth at bedtime.   multivitamin with minerals Tabs tablet Take 1 tablet by mouth daily.   pantoprazole 40 MG tablet Commonly known as: PROTONIX TAKE 1 TABLET BY MOUTH  TWICE DAILY   polyethylene glycol 17 g packet Commonly known as: MIRALAX / GLYCOLAX Take 17 g by mouth daily.   Vitamin D 50 MCG (2000 UT) tablet Take 2,000 Units by mouth daily.        Signature:  Chesley Mires, MD [place] Pager - [phone] 11/23/2020, 1:03 PM

## 2020-11-25 ENCOUNTER — Telehealth: Payer: Medicare Other

## 2020-11-26 ENCOUNTER — Telehealth: Payer: Medicare Other

## 2020-11-30 ENCOUNTER — Encounter: Payer: Self-pay | Admitting: Family Medicine

## 2020-12-02 ENCOUNTER — Ambulatory Visit (HOSPITAL_BASED_OUTPATIENT_CLINIC_OR_DEPARTMENT_OTHER): Payer: Medicare Other | Attending: Pulmonary Disease | Admitting: Pulmonary Disease

## 2020-12-02 ENCOUNTER — Other Ambulatory Visit: Payer: Self-pay

## 2020-12-02 DIAGNOSIS — G4733 Obstructive sleep apnea (adult) (pediatric): Secondary | ICD-10-CM

## 2020-12-03 NOTE — Telephone Encounter (Signed)
Dr. Sood, please see mychart message sent by pt and advise. °

## 2020-12-14 ENCOUNTER — Other Ambulatory Visit (HOSPITAL_BASED_OUTPATIENT_CLINIC_OR_DEPARTMENT_OTHER): Payer: Medicare Other | Admitting: Pulmonary Disease

## 2020-12-17 DIAGNOSIS — G4733 Obstructive sleep apnea (adult) (pediatric): Secondary | ICD-10-CM | POA: Diagnosis not present

## 2020-12-30 DIAGNOSIS — Z9989 Dependence on other enabling machines and devices: Secondary | ICD-10-CM | POA: Diagnosis not present

## 2020-12-30 DIAGNOSIS — G4733 Obstructive sleep apnea (adult) (pediatric): Secondary | ICD-10-CM | POA: Diagnosis not present

## 2021-01-05 ENCOUNTER — Telehealth: Payer: Self-pay | Admitting: Pulmonary Disease

## 2021-01-05 NOTE — Telephone Encounter (Signed)
She was seen by Dr. Melida Quitter on 12/30/20 to assess for [facility] device.  Recommending she have in lab NPSG prior to arranging for [facility] device.  Please let patient know this is the plan, and if she is agreeable then place order for NPSG.

## 2021-01-05 NOTE — Telephone Encounter (Signed)
Patient is concerned about being approved for inspire device because she was told by Dr. Redmond Baseman during appt. That she had 64.9 central events during her sleep study and to have inspire device you cannot have more than 65. Wanted to make sure that Dr. Redmond Baseman was requesting the NPSG to be done for that reason.

## 2021-01-06 NOTE — Telephone Encounter (Signed)
Spoke to patient. She is going to reach out to Dr. Redmond Baseman and discuss.

## 2021-01-06 NOTE — Telephone Encounter (Signed)
Dr. Redmond Baseman is the one who requested to have NPSG scheduled.

## 2021-01-12 DIAGNOSIS — M25511 Pain in right shoulder: Secondary | ICD-10-CM | POA: Diagnosis not present

## 2021-01-17 ENCOUNTER — Encounter: Payer: Self-pay | Admitting: Family Medicine

## 2021-01-17 DIAGNOSIS — G4733 Obstructive sleep apnea (adult) (pediatric): Secondary | ICD-10-CM | POA: Diagnosis not present

## 2021-01-25 ENCOUNTER — Encounter: Payer: Self-pay | Admitting: Pulmonary Disease

## 2021-01-25 ENCOUNTER — Ambulatory Visit: Payer: Medicare Other | Admitting: Pulmonary Disease

## 2021-01-25 ENCOUNTER — Other Ambulatory Visit: Payer: Self-pay

## 2021-01-25 VITALS — BP 124/64 | HR 57 | Temp 98.0°F | Ht 64.0 in | Wt 147.6 lb

## 2021-01-25 DIAGNOSIS — G4733 Obstructive sleep apnea (adult) (pediatric): Secondary | ICD-10-CM | POA: Diagnosis not present

## 2021-01-25 DIAGNOSIS — F418 Other specified anxiety disorders: Secondary | ICD-10-CM | POA: Diagnosis not present

## 2021-01-25 DIAGNOSIS — F5105 Insomnia due to other mental disorder: Secondary | ICD-10-CM | POA: Diagnosis not present

## 2021-01-25 NOTE — Progress Notes (Signed)
[name], Critical Care, and Sleep Medicine  Chief Complaint  Patient presents with   Follow-up    CPAP is working well per patient. But states that she had a bad night last night. Wants to know if CPAP is effecting her osteoarthritis     Constitutional:  BP 124/64 (BP Location: Left Arm, Patient Position: Sitting, Cuff Size: Normal)   Pulse (!) 57   Temp 98 F (36.7 C) (Oral)   Ht 5\' 4"  (1.626 m)   Wt 147 lb 9.6 oz (67 kg)   SpO2 100%   BMI 25.34 kg/m   Past Medical History:  Allergies, Anxiety, OA, C diff, Cataract, Diverticulitis, GERD, HH, HLD, Hypothyroidism, Osteopenia, Osteoporosis, Rosacea  Past Surgical History:  She  has a past surgical history that includes Knee surgery; Back surgery; Laparoscopic liver cyst fenestration; Foot surgery (Right); Cholecystectomy; Tonsillectomy; Appendectomy; Tubal ligation; Open surgical repair of gluteal tendon (Left, 04/01/2014); Total knee arthroplasty (Right, 08/17/2015); Reverse shoulder arthroplasty (Right, 06/23/2017); and Cataract extraction, bilateral (11/2018).  Brief Summary:  Tracy [name] is a 70 y.o. female with obstructive sleep apnea.      Subjective:   She saw Dr. Redmond Baseman with ENT in November.  He requested that she have an in lab sleep study to further assess whether her respiratory events were mostly obstructive or if she has significant central apneas.  She opted against setting up an Hypericum device.  She has been using CPAP.  Has to wash her mask twice per day, and use cream to prevent nose irritation.  Pressure is okay.  She wakes up 2 to 3 times per night.  She is feeling depressed and stressed.  She is having more trouble with joint pain also.  Physical Exam:   Appearance - well kempt   ENMT - no sinus tenderness, no oral exudate, no LAN, Mallampati 3 airway, no stridor  Respiratory - equal breath sounds bilaterally, no wheezing or rales  CV - s1s2 regular rate and rhythm, no murmurs  Ext -  no clubbing, no edema  Skin - no rashes  Psych - normal mood and affect    Sleep Tests:  HST 06/17/20 >> AHI 64.6, SpO2 low 65% Auto CPAP 12/25/20 to 01/21/21 >> used on 30 of 30 nights with average 7 hrs 11 min.  Average AHI 1.9 with median CPAP 6 and 95 th percentile CPAP 8 cm H2O  Cardiac Tests:  Echo 10/17/18 >> EF 60 to 65%  Social History:  She  reports that she has never smoked. She has never used smokeless tobacco. She reports that she does not drink alcohol and does not use drugs.  Family History:  Her family history includes Bone cancer in her paternal grandfather; Hypertension in her mother; Liver disease in her father; Stroke in her mother.     Assessment/Plan:   Obstructive sleep apnea. - she is compliant with CPAP and reports benefit from therapy - she uses [employer] for her DME - continue auto CPAP 5 to 15 cm H2O  Insomnia with depression. - she will f/u with her PCP for further management of her depression - discussed stimulus control and relaxation techniques - advised her to try eating a small protein rich snack about 30 minutes before bedtime to help regulate her cortisol and insulin levels - advised her to maintain a regular exercise regimen in the afternoon hours   Time Spent Involved in Patient Care on Day of Examination:  32 minutes  Follow up:   Patient  Instructions  Follow up in 1 year  Medication List:   Allergies as of 01/25/2021       Reactions   Augmentin [amoxicillin-pot Clavulanate] Diarrhea   Severe diarrhea   Demerol [meperidine] Nausea And Vomiting   Latex Rash, Other (See Comments), Hives   Reaction:  Blisters    Other Itching   Nylon sheets, pt prefers cotton sheets    Tramadol Nausea And Vomiting        Medication List        Accurate as of January 25, 2021  2:20 PM. If you have any questions, ask your nurse or doctor.          CALCIUM 600+D PO Take 1 tablet by mouth 2 (two) times daily.   fenofibrate 160 MG  tablet TAKE 1 TABLET BY MOUTH  DAILY   fluticasone 50 MCG/ACT nasal spray Commonly known as: FLONASE USE 1 SPRAY IN BOTH  NOSTRILS DAILY   melatonin 5 MG Tabs Take 5 mg by mouth at bedtime.   multivitamin with minerals Tabs tablet Take 1 tablet by mouth daily.   pantoprazole 40 MG tablet Commonly known as: PROTONIX TAKE 1 TABLET BY MOUTH  TWICE DAILY   polyethylene glycol 17 g packet Commonly known as: MIRALAX / GLYCOLAX Take 17 g by mouth daily.   Vitamin D 50 MCG (2000 UT) tablet Take 2,000 Units by mouth daily.        Signature:  Chesley Mires, MD [place] Pager - [phone] 01/25/2021, 2:20 PM

## 2021-01-25 NOTE — Patient Instructions (Signed)
Follow up in 1 year.

## 2021-02-10 DIAGNOSIS — H04123 Dry eye syndrome of bilateral lacrimal glands: Secondary | ICD-10-CM | POA: Diagnosis not present

## 2021-02-10 DIAGNOSIS — L718 Other rosacea: Secondary | ICD-10-CM | POA: Diagnosis not present

## 2021-02-10 DIAGNOSIS — H02886 Meibomian gland dysfunction of left eye, unspecified eyelid: Secondary | ICD-10-CM | POA: Diagnosis not present

## 2021-02-10 DIAGNOSIS — H018 Other specified inflammations of eyelid: Secondary | ICD-10-CM | POA: Diagnosis not present

## 2021-02-10 DIAGNOSIS — H02883 Meibomian gland dysfunction of right eye, unspecified eyelid: Secondary | ICD-10-CM | POA: Diagnosis not present

## 2021-02-16 DIAGNOSIS — G4733 Obstructive sleep apnea (adult) (pediatric): Secondary | ICD-10-CM | POA: Diagnosis not present

## 2021-03-02 ENCOUNTER — Telehealth: Payer: Self-pay | Admitting: Family Medicine

## 2021-03-02 NOTE — Telephone Encounter (Signed)
Pt called in asking if we can go ahead and order her Prolia injection (if we haven't already) she has an appt with Tabori on 03/12/21

## 2021-03-02 NOTE — Telephone Encounter (Signed)
Placed Card on action/order board to be ordered prior to her appt on 03/12/21

## 2021-03-04 NOTE — Telephone Encounter (Signed)
Case has been initiated, reached out to Tracy [name] to speed up the process hopefully

## 2021-03-05 ENCOUNTER — Encounter: Payer: Self-pay | Admitting: Family Medicine

## 2021-03-09 DIAGNOSIS — G4733 Obstructive sleep apnea (adult) (pediatric): Secondary | ICD-10-CM | POA: Diagnosis not present

## 2021-03-12 ENCOUNTER — Ambulatory Visit (INDEPENDENT_AMBULATORY_CARE_PROVIDER_SITE_OTHER): Payer: Medicare Other | Admitting: Family Medicine

## 2021-03-12 ENCOUNTER — Encounter: Payer: Medicare Other | Admitting: Family Medicine

## 2021-03-12 ENCOUNTER — Encounter: Payer: Self-pay | Admitting: Family Medicine

## 2021-03-12 VITALS — BP 132/82 | HR 62 | Temp 98.0°F | Resp 16 | Ht 63.5 in | Wt 146.6 lb

## 2021-03-12 DIAGNOSIS — R221 Localized swelling, mass and lump, neck: Secondary | ICD-10-CM

## 2021-03-12 DIAGNOSIS — M810 Age-related osteoporosis without current pathological fracture: Secondary | ICD-10-CM

## 2021-03-12 DIAGNOSIS — E663 Overweight: Secondary | ICD-10-CM | POA: Diagnosis not present

## 2021-03-12 DIAGNOSIS — Z0000 Encounter for general adult medical examination without abnormal findings: Secondary | ICD-10-CM | POA: Diagnosis not present

## 2021-03-12 DIAGNOSIS — F418 Other specified anxiety disorders: Secondary | ICD-10-CM

## 2021-03-12 LAB — CBC WITH DIFFERENTIAL/PLATELET
Basophils Absolute: 0 10*3/uL (ref 0.0–0.1)
Basophils Relative: 1.2 % (ref 0.0–3.0)
Eosinophils Absolute: 0.1 10*3/uL (ref 0.0–0.7)
Eosinophils Relative: 3.6 % (ref 0.0–5.0)
HCT: 39.3 % (ref 36.0–46.0)
Hemoglobin: 13 g/dL (ref 12.0–15.0)
Lymphocytes Relative: 34.6 % (ref 12.0–46.0)
Lymphs Abs: 1.2 10*3/uL (ref 0.7–4.0)
MCHC: 33.1 g/dL (ref 30.0–36.0)
MCV: 89.6 fl (ref 78.0–100.0)
Monocytes Absolute: 0.4 10*3/uL (ref 0.1–1.0)
Monocytes Relative: 10.4 % (ref 3.0–12.0)
Neutro Abs: 1.7 10*3/uL (ref 1.4–7.7)
Neutrophils Relative %: 50.2 % (ref 43.0–77.0)
Platelets: 207 10*3/uL (ref 150.0–400.0)
RBC: 4.39 Mil/uL (ref 3.87–5.11)
RDW: 13.4 % (ref 11.5–15.5)
WBC: 3.5 10*3/uL — ABNORMAL LOW (ref 4.0–10.5)

## 2021-03-12 LAB — HEPATIC FUNCTION PANEL
ALT: 12 U/L (ref 0–35)
AST: 23 U/L (ref 0–37)
Albumin: 4.2 g/dL (ref 3.5–5.2)
Alkaline Phosphatase: 33 U/L — ABNORMAL LOW (ref 39–117)
Bilirubin, Direct: 0.1 mg/dL (ref 0.0–0.3)
Total Bilirubin: 0.5 mg/dL (ref 0.2–1.2)
Total Protein: 6.8 g/dL (ref 6.0–8.3)

## 2021-03-12 LAB — LIPID PANEL
Cholesterol: 161 mg/dL (ref 0–200)
HDL: 74.1 mg/dL (ref >39.00)
LDL Cholesterol: 74 mg/dL (ref 0–99)
NonHDL: 86.42
Total CHOL/HDL Ratio: 2
Triglycerides: 61 mg/dL (ref 0.0–149.0)
VLDL: 12.2 mg/dL (ref 0.0–40.0)

## 2021-03-12 LAB — TSH: TSH: 0.18 u[IU]/mL — ABNORMAL LOW (ref 0.35–5.50)

## 2021-03-12 LAB — BASIC METABOLIC PANEL
BUN: 20 mg/dL (ref 6–23)
CO2: 30 mEq/L (ref 19–32)
Calcium: 10.3 mg/dL (ref 8.4–10.5)
Chloride: 105 mEq/L (ref 96–112)
Creatinine, Ser: 1.04 mg/dL (ref 0.40–1.20)
GFR: 53.53 mL/min — ABNORMAL LOW (ref >60.00)
Glucose, Bld: 87 mg/dL (ref 70–99)
Potassium: 4.1 mEq/L (ref 3.5–5.1)
Sodium: 143 mEq/L (ref 135–145)

## 2021-03-12 LAB — VITAMIN D 25 HYDROXY (VIT D DEFICIENCY, FRACTURES): VITD: 72.5 ng/mL (ref 30.00–100.00)

## 2021-03-12 MED ORDER — SERTRALINE HCL 25 MG PO TABS: 25.0000 mg | ORAL_TABLET | Freq: Every day | ORAL | 3 refills | Status: DC

## 2021-03-12 MED ORDER — DENOSUMAB 60 MG/ML ~~LOC~~ SOSY
60.0000 mg | PREFILLED_SYRINGE | Freq: Once | SUBCUTANEOUS | Status: AC
  Administered 2021-03-12: 60 mg via SUBCUTANEOUS

## 2021-03-12 NOTE — Patient Instructions (Signed)
Follow up in 3-4 weeks to recheck mood We'll notify you of your lab results and make any changes if needed START the Sertraline once daily.  We will likely need to increase the dose Continue to work on healthy diet and regular exercise- you can do it! We'll call you to schedule your ultrasound of the neck Call with any questions or concerns Stay Safe!  Stay Healthy! Hang In There!!!

## 2021-03-12 NOTE — Progress Notes (Signed)
Subjective:    Patient ID: Tracy [name], female    DOB: [birth year], 71 y.o.   MRN: [id number]  HPI CPE- UTD on colonoscopy, mammo, PNA vaccine, Tdap, flu.  Patient Care Team    Relationship Specialty Notifications Start End  [name], MD PCP - General   02/10/10   Lorretta Harp, MD PCP - Cardiology Cardiology  10/29/18   [name], MD Consulting Physician Internal Medicine  03/13/15   Festus Aloe, MD Consulting Physician Urology  03/13/15   Gaynelle Arabian, MD Consulting Physician Orthopedic Surgery  05/19/16   Harriett Sine, MD Consulting Physician Dermatology  05/19/16   Mauri Pole, MD Consulting Physician Gastroenterology  09/07/16   Hortencia Pilar, MD Consulting Physician Surgery  11/28/17    Comment: Opthalmology   Netta Cedars, MD Consulting Physician Orthopedic Surgery  11/28/17   Wylene Simmer, MD Consulting Physician Orthopedic Surgery  11/28/17    Comment: foot  Madelin Rear, [facility] Pharmacist Pharmacist  11/13/19    Comment: [phone]    Health Maintenance  Topic Date Due   COVID-19 Vaccine (5 - Booster for Pfizer series) 01/05/2021   Zoster Vaccines- Shingrix (1 of 2) 06/10/2021 (Originally 04/02/1998)   MAMMOGRAM  10/27/2021   TETANUS/TDAP  06/29/2022   COLONOSCOPY (Pts 45-90yrs Insurance coverage will need to be confirmed)  01/06/2027   Pneumonia Vaccine 23+ Years old  Completed   INFLUENZA VACCINE  Completed   DEXA SCAN  Completed   Hepatitis C Screening  Completed   HPV VACCINES  Aged Out      Review of Systems Patient reports no vision/ hearing changes, adenopathy,fever, weight change,  persistant/recurrent hoarseness , swallowing issues, chest pain, palpitations, edema, persistant/recurrent cough, hemoptysis, dyspnea (rest/exertional/paroxysmal nocturnal), gastrointestinal bleeding (melena, rectal bleeding), abdominal pain, significant heartburn, bowel changes, GU symptoms (dysuria, hematuria, incontinence), Gyn  symptoms (abnormal  bleeding, pain),  syncope, focal weakness, memory loss, numbness & tingling, skin/hair/nail changes, abnormal bruising or bleeding  + anxiety- pt reports she is much more anxious than before.  She's concerned about her husband and the possibility of dementia, overwhelmed w/ musculoskeletal pain.  States she and her husband are fighting daily.  Very tearful.  Son continues to struggle w/ substance abuse issues.  Has previously been on Fluoxetine and Cymbalta   This visit occurred during the SARS-CoV-2 public health emergency.  Safety protocols were in place, including screening questions prior to the visit, additional usage of staff PPE, and extensive cleaning of exam room while observing appropriate contact time as indicated for disinfecting solutions.      Objective:   Physical Exam General Appearance:    Alert, cooperative, no distress, appears stated age  Head:    Normocephalic, without obvious abnormality, atraumatic  Eyes:    PERRL, conjunctiva/corneas clear, EOM's intact, fundi    benign, both eyes  Ears:    Normal TM's and external ear canals, both ears  Nose:   Nares normal, septum midline, mucosa normal, no drainage    or sinus tenderness  Throat:   Lips, mucosa, and tongue normal; teeth and gums normal  Neck:   Supple, symmetrical, trachea midline, no adenopathy;    Thyroid: no enlargement/tenderness/nodules  Back:     Symmetric, no curvature, ROM normal, no CVA tenderness  Lungs:     Clear to auscultation bilaterally, respirations unlabored  Chest Wall:    No tenderness or deformity   Heart:    Regular rate and rhythm, S1 and S2 normal, no murmur,  rub   or gallop  Breast Exam:    Deferred to mammo  Abdomen:     Soft, non-tender, bowel sounds active all four quadrants,    no masses, no organomegaly  Genitalia:    Deferred to GYN  Rectal:    Extremities:   Extremities normal, atraumatic, no cyanosis or edema  Pulses:   2+ and symmetric all extremities  Skin:    Skin color, texture, turgor normal, no rashes or lesions  Lymph nodes:   Cervical, supraclavicular, and axillary nodes normal  Neurologic:   CNII-XII intact, normal strength, sensation and reflexes    throughout          Assessment & Plan:

## 2021-03-14 NOTE — Assessment & Plan Note (Signed)
Deteriorated.  Pt is tearful, overly anxious, depressed.  She and husband are fighting regularly- though she does report feeling safe at home.  Son is still struggling w/ mental health and substance issues.  She is concerned her husband has dementia.  She is aware that a lot of her physical symptoms are either caused by or worsened by her anxiety.  She is open to idea of starting medication.  She has been on Prozac and Cymbalta in the past.  Start low dose Sertraline and counseled her that we will likely need to titrate dose.  Pt expressed understanding and is in agreement w/ plan.

## 2021-03-14 NOTE — Assessment & Plan Note (Signed)
prolia injxn given.

## 2021-03-14 NOTE — Assessment & Plan Note (Signed)
Encouraged regular physical activity and healthy food choices.  Will continue to follow.

## 2021-03-14 NOTE — Assessment & Plan Note (Signed)
Pt's PE unchanged from previous.  UTD on colonoscopy, mammo, vaccines.  Check labs.  Anticipatory guidance provided.

## 2021-03-15 ENCOUNTER — Other Ambulatory Visit: Payer: Self-pay

## 2021-03-15 ENCOUNTER — Ambulatory Visit (HOSPITAL_BASED_OUTPATIENT_CLINIC_OR_DEPARTMENT_OTHER)
Admission: RE | Admit: 2021-03-15 | Discharge: 2021-03-15 | Disposition: A | Discharge status: Home / Self Care | Payer: Medicare Other | Source: Ambulatory Visit | Attending: Family Medicine | Admitting: Family Medicine

## 2021-03-15 ENCOUNTER — Encounter: Payer: Self-pay | Admitting: Family Medicine

## 2021-03-15 DIAGNOSIS — R221 Localized swelling, mass and lump, neck: Secondary | ICD-10-CM | POA: Insufficient documentation

## 2021-03-16 ENCOUNTER — Encounter: Payer: Self-pay | Admitting: Family Medicine

## 2021-03-16 NOTE — Addendum Note (Signed)
Addended by: [name] on: 03/16/2021 07:28 AM   Modules accepted: Orders

## 2021-03-17 ENCOUNTER — Encounter: Payer: Self-pay | Admitting: Family Medicine

## 2021-03-17 MED ORDER — PANTOPRAZOLE SODIUM 40 MG PO TBEC: 40.0000 mg | DELAYED_RELEASE_TABLET | Freq: Two times a day (BID) | ORAL | 1 refills | Status: DC

## 2021-03-17 MED ORDER — FENOFIBRATE 160 MG PO TABS: 160.0000 mg | ORAL_TABLET | Freq: Every day | ORAL | 3 refills | Status: DC

## 2021-03-17 MED ORDER — FLUTICASONE PROPIONATE 50 MCG/ACT NA SUSP: NASAL | 3 refills | Status: DC

## 2021-03-18 ENCOUNTER — Encounter: Payer: Self-pay | Admitting: Family Medicine

## 2021-03-18 DIAGNOSIS — G4733 Obstructive sleep apnea (adult) (pediatric): Secondary | ICD-10-CM | POA: Diagnosis not present

## 2021-03-18 DIAGNOSIS — R7989 Other specified abnormal findings of blood chemistry: Secondary | ICD-10-CM

## 2021-03-19 ENCOUNTER — Other Ambulatory Visit: Payer: Self-pay

## 2021-03-19 ENCOUNTER — Other Ambulatory Visit (INDEPENDENT_AMBULATORY_CARE_PROVIDER_SITE_OTHER): Payer: Medicare Other

## 2021-03-19 DIAGNOSIS — G4733 Obstructive sleep apnea (adult) (pediatric): Secondary | ICD-10-CM | POA: Diagnosis not present

## 2021-03-19 DIAGNOSIS — R7989 Other specified abnormal findings of blood chemistry: Secondary | ICD-10-CM | POA: Diagnosis not present

## 2021-03-19 LAB — T4, FREE: Free T4: 0.94 ng/dL (ref 0.60–1.60)

## 2021-03-19 LAB — T3, FREE: T3, Free: 3.4 pg/mL (ref 2.3–4.2)

## 2021-03-22 ENCOUNTER — Telehealth: Payer: Self-pay

## 2021-03-22 ENCOUNTER — Encounter (HOSPITAL_BASED_OUTPATIENT_CLINIC_OR_DEPARTMENT_OTHER): Payer: Self-pay

## 2021-03-22 ENCOUNTER — Other Ambulatory Visit: Payer: Self-pay

## 2021-03-22 ENCOUNTER — Ambulatory Visit (HOSPITAL_BASED_OUTPATIENT_CLINIC_OR_DEPARTMENT_OTHER)
Admission: RE | Admit: 2021-03-22 | Discharge: 2021-03-22 | Disposition: A | Discharge status: Home / Self Care | Payer: Medicare Other | Source: Ambulatory Visit | Attending: Family Medicine | Admitting: Family Medicine

## 2021-03-22 ENCOUNTER — Ambulatory Visit (HOSPITAL_BASED_OUTPATIENT_CLINIC_OR_DEPARTMENT_OTHER): Payer: Medicare Other

## 2021-03-22 DIAGNOSIS — R221 Localized swelling, mass and lump, neck: Secondary | ICD-10-CM | POA: Diagnosis not present

## 2021-03-22 MED ORDER — IOHEXOL 300 MG/ML  SOLN
100.0000 mL | Freq: Once | INTRAMUSCULAR | Status: AC | PRN
  Administered 2021-03-22: 75 mL via INTRAVENOUS

## 2021-03-22 NOTE — Telephone Encounter (Signed)
Lvm for patient letting her know about lab results. Ask that she call back with any questions

## 2021-03-22 NOTE — Telephone Encounter (Signed)
-----   Message from [name], MD sent at 03/22/2021  7:33 AM EST ----- T3 and T4 are both normal.  Great news!!

## 2021-04-09 ENCOUNTER — Encounter: Payer: Self-pay | Admitting: Family Medicine

## 2021-04-09 ENCOUNTER — Ambulatory Visit (INDEPENDENT_AMBULATORY_CARE_PROVIDER_SITE_OTHER): Payer: Medicare Other | Admitting: Family Medicine

## 2021-04-09 VITALS — BP 124/82 | HR 69 | Temp 98.0°F | Resp 16 | Wt 146.0 lb

## 2021-04-09 DIAGNOSIS — F418 Other specified anxiety disorders: Secondary | ICD-10-CM | POA: Diagnosis not present

## 2021-04-09 MED ORDER — SERTRALINE HCL 50 MG PO TABS: 50.0000 mg | ORAL_TABLET | Freq: Every day | ORAL | 3 refills | Status: DC

## 2021-04-09 NOTE — Progress Notes (Signed)
° °  Subjective:    Patient ID: Tracy [name], female    DOB: Jun 24, 1950, 71 y.o.   MRN: [id number]  HPI Anxiety/Depression- pt was started on Sertraline 25mg  at last visit.  Feeling 'much better' than before.  Pt reports she is no longer tearful and doesn't 'get upset the way I used to'.  Still has some anxiety but nowhere near the level it was.  Pt feels less reactive.  Is no longer hopeless.  No medication side effects.  Walking daily which is enjoyable for her.   Review of Systems For ROS see HPI   This visit occurred during the SARS-CoV-2 public health emergency.  Safety protocols were in place, including screening questions prior to the visit, additional usage of staff PPE, and extensive cleaning of exam room while observing appropriate contact time as indicated for disinfecting solutions.      Objective:   Physical Exam Vitals reviewed.  Constitutional:      General: She is not in acute distress.    Appearance: Normal appearance. She is not ill-appearing.  HENT:     Head: Normocephalic and atraumatic.  Eyes:     Extraocular Movements: Extraocular movements intact.     Conjunctiva/sclera: Conjunctivae normal.     Pupils: Pupils are equal, round, and reactive to light.  Neurological:     General: No focal deficit present.     Mental Status: She is alert and oriented to person, place, and time.  Psychiatric:        Mood and Affect: Mood normal.        Behavior: Behavior normal.        Thought Content: Thought content normal.          Assessment & Plan:

## 2021-04-09 NOTE — Patient Instructions (Signed)
Follow up in 6-8 weeks to recheck mood INCREASE the Sertraline to 50mg  daily- 2 of what you have at home and 1 of the new prescription (higher dose) I'm so proud of you for deciding you deserve better!!! Call with any questions or concerns Happy Bel[employer] Birthday!!!

## 2021-04-09 NOTE — Assessment & Plan Note (Signed)
Improved w/ addition of Sertraline at last visit.  Pt still feels that there is room for improvement and would like to increase to 50mg  daily.  New prescription sent.

## 2021-04-19 DIAGNOSIS — G4733 Obstructive sleep apnea (adult) (pediatric): Secondary | ICD-10-CM | POA: Diagnosis not present

## 2021-05-12 DIAGNOSIS — H04123 Dry eye syndrome of bilateral lacrimal glands: Secondary | ICD-10-CM | POA: Diagnosis not present

## 2021-05-17 DIAGNOSIS — G4733 Obstructive sleep apnea (adult) (pediatric): Secondary | ICD-10-CM | POA: Diagnosis not present

## 2021-05-21 ENCOUNTER — Ambulatory Visit (INDEPENDENT_AMBULATORY_CARE_PROVIDER_SITE_OTHER): Payer: Medicare Other | Admitting: Family Medicine

## 2021-05-21 ENCOUNTER — Encounter: Payer: Self-pay | Admitting: Family Medicine

## 2021-05-21 VITALS — BP 122/66 | HR 50 | Temp 97.7°F | Resp 18 | Ht 63.5 in | Wt 146.2 lb

## 2021-05-21 DIAGNOSIS — F418 Other specified anxiety disorders: Secondary | ICD-10-CM | POA: Diagnosis not present

## 2021-05-21 MED ORDER — SERTRALINE HCL 100 MG PO TABS: 100.0000 mg | ORAL_TABLET | Freq: Every day | ORAL | 3 refills | Status: DC

## 2021-05-21 NOTE — Progress Notes (Signed)
? ?  Subjective:  ? ? Patient ID: Tracy [name], female    DOB: Jul 13, 1950, 71 y.o.   MRN: [id number] ? ?HPI ?Depression/anxiety- at last visit Sertraline was increased to '50mg'$  daily.  Pt reports the increased dose of Sertraline has been helpful.  Pt was wondering if medication was supposed to eliminate anxiety and worry.  Pt reports feeling better physically and 'somewhat mentally'.  Pt is walking daily and has started pushing herself to go faster.  Walking has been helpful for her anxiety. ? ? ?Review of Systems ?For ROS see HPI  ? ?This visit occurred during the SARS-CoV-2 public health emergency.  Safety protocols were in place, including screening questions prior to the visit, additional usage of staff PPE, and extensive cleaning of exam room while observing appropriate contact time as indicated for disinfecting solutions.   ?   ?Objective:  ? Physical Exam ?Vitals reviewed.  ?Constitutional:   ?   General: She is not in acute distress. ?   Appearance: Normal appearance. She is not ill-appearing.  ?HENT:  ?   Head: Normocephalic and atraumatic.  ?Skin: ?   General: Skin is warm and dry.  ?Neurological:  ?   General: No focal deficit present.  ?   Mental Status: She is alert and oriented to person, place, and time.  ?Psychiatric:     ?   Mood and Affect: Mood normal.     ?   Behavior: Behavior normal.     ?   Thought Content: Thought content normal.  ? ? ? ? ? ?   ?Assessment & Plan:  ? ? ?

## 2021-05-21 NOTE — Assessment & Plan Note (Signed)
Improved w/ increased dose of Sertraline.  Pt feels there is still room for improvement and is interested in increasing to '100mg'$  daily.  Discussed benefit of counseling but pt is hesitant at this time.  Will continue to follow closely.  Pt expressed understanding and is in agreement w/ plan.  ?

## 2021-05-21 NOTE — Patient Instructions (Addendum)
Follow up in 6-8 weeks to recheck mood ?INCREASE the Sertraline to '100mg'$  daily ?If for whatever reason this higher dose doesn't agree w/ you, we can cut it in half (back to '50mg'$ ) ?I'm thrilled that you are walking and getting that time to clear your head ?Call with any questions or concerns ?Have a great weekend!!! ?

## 2021-06-17 DIAGNOSIS — G4733 Obstructive sleep apnea (adult) (pediatric): Secondary | ICD-10-CM | POA: Diagnosis not present

## 2021-06-24 ENCOUNTER — Ambulatory Visit (INDEPENDENT_AMBULATORY_CARE_PROVIDER_SITE_OTHER): Payer: Medicare Other

## 2021-06-24 DIAGNOSIS — Z0000 Encounter for general adult medical examination without abnormal findings: Secondary | ICD-10-CM

## 2021-06-24 NOTE — Patient Instructions (Signed)
Ms. Sforza , ?Thank you for taking time to come for your Medicare Wellness Visit. I appreciate your ongoing commitment to your health goals. Please review the following plan we discussed and let me know if I can assist you in the future.  ? ?Screening recommendations/referrals: ?Colonoscopy: 01/05/2017 ?Mammogram: 10/27/2020 ?Bone Density: 05/16/2017 ?Recommended yearly ophthalmology/optometry visit for glaucoma screening and checkup ?Recommended yearly dental visit for hygiene and checkup ? ?Vaccinations: ?Influenza vaccine: completed  ?Pneumococcal vaccine: completed  ?Tdap vaccine: 06/28/2012 ?Shingles vaccine: will consider    ? ?Advanced directives: yes  ? ?Conditions/risks identified: none  ? ?Next appointment: none  ? ? ?Preventive Care 65 Years and Older, Female ?Preventive care refers to lifestyle choices and visits with your health care provider that can promote health and wellness. ?What does preventive care include? ?A yearly physical exam. This is also called an annual well check. ?Dental exams once or twice a year. ?Routine eye exams. Ask your health care provider how often you should have your eyes checked. ?Personal lifestyle choices, including: ?Daily care of your teeth and gums. ?Regular physical activity. ?Eating a healthy diet. ?Avoiding tobacco and drug use. ?Limiting alcohol use. ?Practicing safe sex. ?Taking low-dose aspirin every day. ?Taking vitamin and mineral supplements as recommended by your health care provider. ?What happens during an annual well check? ?The services and screenings done by your health care provider during your annual well check will depend on your age, overall health, lifestyle risk factors, and family history of disease. ?Counseling  ?Your health care provider may ask you questions about your: ?Alcohol use. ?Tobacco use. ?Drug use. ?Emotional well-being. ?Home and relationship well-being. ?Sexual activity. ?Eating habits. ?History of falls. ?Memory and ability to  understand (cognition). ?Work and work Statistician. ?Reproductive health. ?Screening  ?You may have the following tests or measurements: ?Height, weight, and BMI. ?Blood pressure. ?Lipid and cholesterol levels. These may be checked every 5 years, or more frequently if you are over 32 years old. ?Skin check. ?Lung cancer screening. You may have this screening every year starting at age 82 if you have a 30-pack-year history of smoking and currently smoke or have quit within the past 15 years. ?Fecal occult blood test (FOBT) of the stool. You may have this test every year starting at age 77. ?Flexible sigmoidoscopy or colonoscopy. You may have a sigmoidoscopy every 5 years or a colonoscopy every 10 years starting at age 31. ?Hepatitis C blood test. ?Hepatitis B blood test. ?Sexually transmitted disease (STD) testing. ?Diabetes screening. This is done by checking your blood sugar (glucose) after you have not eaten for a while (fasting). You may have this done every 1-3 years. ?Bone density scan. This is done to screen for osteoporosis. You may have this done starting at age 9. ?Mammogram. This may be done every 1-2 years. Talk to your health care provider about how often you should have regular mammograms. ?Talk with your health care provider about your test results, treatment options, and if necessary, the need for more tests. ?Vaccines  ?Your health care provider may recommend certain vaccines, such as: ?Influenza vaccine. This is recommended every year. ?Tetanus, diphtheria, and acellular pertussis (Tdap, Td) vaccine. You may need a Td booster every 10 years. ?Zoster vaccine. You may need this after age 76. ?Pneumococcal 13-valent conjugate (PCV13) vaccine. One dose is recommended after age 55. ?Pneumococcal polysaccharide (PPSV23) vaccine. One dose is recommended after age 56. ?Talk to your health care provider about which screenings and vaccines you need and how often  you need them. ?This information is not  intended to replace advice given to you by your health care provider. Make sure you discuss any questions you have with your health care provider. ?Document Released: 03/13/2015 Document Revised: 11/04/2015 Document Reviewed: 12/16/2014 ?Elsevier Interactive Patient Education ? 2017 [employer]. ? ?Fall Prevention in the Home ?Falls can cause injuries. They can happen to people of all ages. There are many things you can do to make your home safe and to help prevent falls. ?What can I do on the outside of my home? ?Regularly fix the edges of walkways and driveways and fix any cracks. ?Remove anything that might make you trip as you walk through a door, such as a raised step or threshold. ?Trim any bushes or trees on the path to your home. ?Use bright outdoor lighting. ?Clear any walking paths of anything that might make someone trip, such as rocks or tools. ?Regularly check to see if handrails are loose or broken. Make sure that both sides of any steps have handrails. ?Any raised decks and porches should have guardrails on the edges. ?Have any leaves, snow, or ice cleared regularly. ?Use sand or salt on walking paths during winter. ?Clean up any spills in your garage right away. This includes oil or grease spills. ?What can I do in the bathroom? ?Use night lights. ?Install grab bars by the toilet and in the tub and shower. Do not use towel bars as grab bars. ?Use non-skid mats or decals in the tub or shower. ?If you need to sit down in the shower, use a plastic, non-slip stool. ?Keep the floor dry. Clean up any water that spills on the floor as soon as it happens. ?Remove soap buildup in the tub or shower regularly. ?Attach bath mats securely with double-sided non-slip rug tape. ?Do not have throw rugs and other things on the floor that can make you trip. ?What can I do in the bedroom? ?Use night lights. ?Make sure that you have a light by your bed that is easy to reach. ?Do not use any sheets or blankets that are  too big for your bed. They should not hang down onto the floor. ?Have a firm chair that has side arms. You can use this for support while you get dressed. ?Do not have throw rugs and other things on the floor that can make you trip. ?What can I do in the kitchen? ?Clean up any spills right away. ?Avoid walking on wet floors. ?Keep items that you use a lot in easy-to-reach places. ?If you need to reach something above you, use a strong step stool that has a grab bar. ?Keep electrical cords out of the way. ?Do not use floor polish or wax that makes floors slippery. If you must use wax, use non-skid floor wax. ?Do not have throw rugs and other things on the floor that can make you trip. ?What can I do with my stairs? ?Do not leave any items on the stairs. ?Make sure that there are handrails on both sides of the stairs and use them. Fix handrails that are broken or loose. Make sure that handrails are as Retter as the stairways. ?Check any carpeting to make sure that it is firmly attached to the stairs. Fix any carpet that is loose or worn. ?Avoid having throw rugs at the top or bottom of the stairs. If you do have throw rugs, attach them to the floor with carpet tape. ?Make sure that you have a light  switch at the top of the stairs and the bottom of the stairs. If you do not have them, ask someone to add them for you. ?What else can I do to help prevent falls? ?Wear shoes that: ?Do not have high heels. ?Have rubber bottoms. ?Are comfortable and fit you well. ?Are closed at the toe. Do not wear sandals. ?If you use a stepladder: ?Make sure that it is fully opened. Do not climb a closed stepladder. ?Make sure that both sides of the stepladder are locked into place. ?Ask someone to hold it for you, if possible. ?Clearly mark and make sure that you can see: ?Any grab bars or handrails. ?First and last steps. ?Where the edge of each step is. ?Use tools that help you move around (mobility aids) if they are needed. These  include: ?Canes. ?Walkers. ?Scooters. ?Crutches. ?Turn on the lights when you go into a dark area. Replace any light bulbs as soon as they burn out. ?Set up your furniture so you have a clear path. Avoid moving your f

## 2021-06-24 NOTE — Progress Notes (Signed)
? ?Subjective:  ? Tracy [name] is a 71 y.o. female who presents for Medicare Annual (Subsequent) preventive examination. ? ? ?I connected with Tracy [name] today by telephone and verified that I am speaking with the correct person using two identifiers. ?Location patient: home ?Location provider: work ?Persons participating in the virtual visit: patient, provider. ?  ?I discussed the limitations, risks, security and privacy concerns of performing an evaluation and management service by telephone and the availability of in person appointments. I also discussed with the patient that there may be a patient responsible charge related to this service. The patient expressed understanding and verbally consented to this telephonic visit.  ?  ?Interactive audio and video telecommunications were attempted between this provider and patient, however failed, due to patient having technical difficulties OR patient did not have access to video capability.  We continued and completed visit with audio only. ? ?  ?Review of Systems    ? ?Cardiac Risk Factors include: advanced age (>11mn, >>6women) ? ?   ?Objective:  ?  ?Today's Vitals  ? ?There is no height or weight on file to calculate BMI. ? ? ?  06/24/2021  ?  4:04 PM 07/03/2020  ?  9:00 PM 03/09/2020  ?  9:04 AM 07/12/2019  ? 12:43 PM 11/28/2017  ? 10:30 AM 06/23/2017  ?  5:35 PM 06/23/2017  ? 12:05 PM  ?Advanced Directives  ?Does Patient Have a Medical Advance Directive? Yes No Yes No Yes Yes Yes  ?Type of [place]  [place] will  [place] will Living will Living will  ?Does patient want to make changes to medical advance directive?      No - Patient declined No - Patient declined  ?Copy of [place]? No - copy requested  Yes - validated most recent copy scanned in chart (See row information)  Yes    ?Would patient like information on creating a  medical advance directive?  No - Patient declined  No - Patient declined     ? ? ?Current Medications (verified) ?Outpatient Encounter Medications as of 06/24/2021  ?Medication Sig  ? Calcium Carbonate-Vitamin D (CALCIUM 600+D PO) Take 1 tablet by mouth 2 (two) times daily.  ? Cholecalciferol (VITAMIN D) 2000 units tablet Take 2,000 Units by mouth daily.  ? fenofibrate 160 MG tablet Take 1 tablet (160 mg total) by mouth daily.  ? fluticasone (FLONASE) 50 MCG/ACT nasal spray USE 1 SPRAY IN BOTH  NOSTRILS DAILY  ? Lifitegrast (XIIDRA) 5 % SOLN Apply to eye.  ? Melatonin 5 MG TABS Take 5 mg by mouth at bedtime.  ? Multiple Vitamin (MULTIVITAMIN WITH MINERALS) TABS tablet Take 1 tablet by mouth daily.  ? pantoprazole (PROTONIX) 40 MG tablet Take 1 tablet (40 mg total) by mouth 2 (two) times daily.  ? polyethylene glycol (MIRALAX / GLYCOLAX) packet Take 17 g by mouth daily.  ? sertraline (ZOLOFT) 100 MG tablet Take 1 tablet (100 mg total) by mouth daily.  ? ?No facility-administered encounter medications on file as of 06/24/2021.  ? ? ?Allergies (verified) ?Augmentin [amoxicillin-pot clavulanate], Demerol [meperidine], Latex, Other, and Tramadol  ? ?History: ?Past Medical History:  ?Diagnosis Date  ? Allergy   ? Anxiety   ? Arthritis   ? Bursitis of left hip   ? C. difficile diarrhea   ? 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 Cataract   ?  bilateral  ? Complication of anesthesia   ? Diverticulitis   ? GERD (gastroesophageal reflux disease)   ? Hx: UTI (urinary tract infection)   ? Hyperlipidemia   ? Hyperthyroidism   ? Osteopenia   ? Osteoporosis   ? PONV (postoperative nausea and vomiting)   ? Rosacea   ? Rotator cuff arthropathy, left   ? ?Past Surgical History:  ?Procedure Laterality Date  ? APPENDECTOMY    ? BACK SURGERY    ? CATARACT EXTRACTION, BILATERAL  11/2018  ? CHOLECYSTECTOMY    ? FOOT SURGERY Right   ? X 2  ? KNEE SURGERY    ? LAPAROSCOPIC LIVER CYST FENESTRATION    ? OPEN SURGICAL REPAIR OF GLUTEAL TENDON Left  04/01/2014  ? Procedure: LEFT HIP BURSECTOMY WITH GLUTEAL TENDON REPAIR;  Surgeon: Gearlean Alf, MD;  Location: [facility];  Service: Orthopedics;  Laterality: Left;  ? REVERSE SHOULDER ARTHROPLASTY Right 06/23/2017  ? Procedure: RIGHT REVERSE SHOULDER ARTHROPLASTY;  Surgeon: Netta Cedars, MD;  Location: [facility];  Service: Orthopedics;  Laterality: Right;  ? TONSILLECTOMY    ? TOTAL KNEE ARTHROPLASTY Right 08/17/2015  ? Procedure: RIGHT TOTAL KNEE ARTHROPLASTY;  Surgeon: Gaynelle Arabian, MD;  Location: [facility];  Service: Orthopedics;  Laterality: Right;  ? TUBAL LIGATION    ? ?Family History  ?Problem Relation Age of Onset  ? Hypertension Mother   ? Stroke Mother   ? Liver disease Father   ?     alcohol related  ? Bone cancer Paternal Grandfather   ? Cancer Neg Hx   ? Diabetes Neg Hx   ? Colon cancer Neg Hx   ? Esophageal cancer Neg Hx   ? Pancreatic cancer Neg Hx   ? Stomach cancer Neg Hx   ? ?Social History  ? ?Socioeconomic History  ? Marital status: Married  ?  Spouse name: Not on file  ? Number of children: Not on file  ? Years of education: Not on file  ? Highest education level: Not on file  ?Occupational History  ? Not on file  ?Tobacco Use  ? Smoking status: Never  ? Smokeless tobacco: Never  ?Vaping Use  ? Vaping Use: Never used  ?Substance and Sexual Activity  ? Alcohol use: No  ? Drug use: No  ? Sexual activity: Not Currently  ?Other Topics Concern  ? Not on file  ?Social History Narrative  ? Not on file  ? ?Social Determinants of Health  ? ?Financial Resource Strain: Low Risk   ? Difficulty of Paying Living Expenses: Not hard at all  ?Food Insecurity: No Food Insecurity  ? Worried About Charity fundraiser in the Last Year: Never true  ? Ran Out of Food in the Last Year: Never true  ?Transportation Needs: No Transportation Needs  ? Lack of Transportation (Medical): No  ? Lack of Transportation (Non-Medical): No  ?Physical Activity: Sufficiently Active  ? Days of Exercise per Week: 7 days  ? Minutes of Exercise  per Session: 30 min  ?Stress: No Stress Concern Present  ? Feeling of Stress : Not at all  ?Social Connections: Moderately Integrated  ? Frequency of Communication with Friends and Family: Three times a week  ? Frequency of Social Gatherings with Friends and Family: Three times a week  ? Attends Religious Services: More than 4 times per year  ? Active Member of Clubs or Organizations: No  ? Attends Archivist Meetings: Never  ? Marital Status: Married  ? ? ?  Tobacco Counseling ?Counseling given: Not Answered ? ? ?Clinical Intake: ? ?Pre-visit preparation completed: Yes ? ?Pain : No/denies pain ? ?  ? ?Nutritional Risks: None ?Diabetes: No ? ?How often do you need to have someone help you when you read instructions, pamphlets, or other written materials from your doctor or pharmacy?: 1 - Never ?What is the last grade level you completed in school?: college ? ?Diabetic?no  ? ?Interpreter Needed?: No ? ?Information entered by :: [email or web address] ? ? ?Activities of Daily Living ? ?  06/24/2021  ?  4:07 PM 03/12/2021  ? 10:02 AM  ?In your present state of health, do you have any difficulty performing the following activities:  ?Hearing? 0 0  ?Vision? 0 0  ?Difficulty concentrating or making decisions? 0 0  ?Walking or climbing stairs? 0 0  ?Dressing or bathing? 0 0  ?Doing errands, shopping? 0 0  ?Preparing Food and eating ? N   ?Using the Toilet? N   ?In the past six months, have you accidently leaked urine? N   ?Do you have problems with loss of bowel control? N   ?Managing your Medications? N   ?Managing your Finances? N   ?Housekeeping or managing your Housekeeping? N   ? ? ?Patient Care Team: ?[name], MD as PCP - General ?Lorretta Harp, MD as PCP - Cardiology (Cardiology) ?[name], MD as Consulting Physician (Internal Medicine) ?Festus Aloe, MD as Consulting Physician (Urology) ?Gaynelle Arabian, MD as Consulting Physician (Orthopedic Surgery) ?Harriett Sine, MD as Consulting  Physician (Dermatology) ?Mauri Pole, MD as Consulting Physician (Gastroenterology) ?Hortencia Pilar, MD as Consulting Physician (Surgery) ?Netta Cedars, MD as Consulting Physician ([place]

## 2021-06-30 ENCOUNTER — Ambulatory Visit (INDEPENDENT_AMBULATORY_CARE_PROVIDER_SITE_OTHER): Payer: Medicare Other | Admitting: Family Medicine

## 2021-06-30 ENCOUNTER — Encounter: Payer: Self-pay | Admitting: Family Medicine

## 2021-06-30 VITALS — BP 116/68 | HR 64 | Temp 98.1°F | Resp 16 | Ht 63.5 in | Wt 148.6 lb

## 2021-06-30 DIAGNOSIS — F418 Other specified anxiety disorders: Secondary | ICD-10-CM | POA: Diagnosis not present

## 2021-06-30 NOTE — Patient Instructions (Signed)
Schedule your complete physical for January ?No med changes at this time ?Keep up the good work!  You look great!! ?Call with any questions or concerns ?Stay Safe!  Stay Healthy! ?Have a great summer!!! ?

## 2021-06-30 NOTE — Assessment & Plan Note (Signed)
Pt is feeling so much better since starting medication.  She feels that increasing the dose at last visit made things even better.  She is smiling, she is happy, she feels like she is back to herself.  No med changes at this time.  Will follow. ?

## 2021-06-30 NOTE — Progress Notes (Signed)
? ?  Subjective:  ? ? Patient ID: [name], female    DOB: 1950/08/10, 71 y.o.   MRN: [id number] ? ?HPI ?Depression w/ anxiety- pt's Sertraline was increased to '100mg'$  at last visit.  Pt reports feeling 'so much better'.  Pt finds that it's easier to remain calm.  She is now able to ignore some of the things her husband says that used to really upset her.  Exercising regularly- this is a great outlet for her.  Pt feels dose is appropriate. ? ? ?Review of Systems ?For ROS see HPI  ?   ?Objective:  ? Physical Exam ?Vitals reviewed.  ?Constitutional:   ?   General: She is not in acute distress. ?   Appearance: Normal appearance. She is not ill-appearing.  ?HENT:  ?   Head: Normocephalic and atraumatic.  ?Skin: ?   General: Skin is warm and dry.  ?Neurological:  ?   General: No focal deficit present.  ?   Mental Status: She is alert and oriented to person, place, and time.  ?Psychiatric:     ?   Mood and Affect: Mood normal.     ?   Behavior: Behavior normal.     ?   Thought Content: Thought content normal.  ? ? ? ? ? ?   ?Assessment & Plan:  ? ? ?

## 2021-07-02 ENCOUNTER — Encounter: Payer: Self-pay | Admitting: Family Medicine

## 2021-07-07 ENCOUNTER — Encounter: Payer: Self-pay | Admitting: Pulmonary Disease

## 2021-07-07 ENCOUNTER — Ambulatory Visit: Payer: Medicare Other | Admitting: Pulmonary Disease

## 2021-07-07 VITALS — BP 124/68 | HR 57 | Temp 98.2°F | Ht 62.0 in | Wt 147.4 lb

## 2021-07-07 DIAGNOSIS — F5105 Insomnia due to other mental disorder: Secondary | ICD-10-CM

## 2021-07-07 DIAGNOSIS — G4733 Obstructive sleep apnea (adult) (pediatric): Secondary | ICD-10-CM

## 2021-07-07 DIAGNOSIS — F418 Other specified anxiety disorders: Secondary | ICD-10-CM

## 2021-07-07 NOTE — Progress Notes (Signed)
? ? Pulmonary, Critical Care, and Sleep Medicine ? ?Chief Complaint  ?Patient presents with  ? Follow-up  ?  No c/o   ? ? ?Constitutional:  ?BP 124/68 (BP Location: Left Arm, Patient Position: Sitting, Cuff Size: Normal)   Pulse (!) 57   Temp 98.2 ?F (36.8 ?C) (Oral)   Ht '5\' 2"'$  (1.575 m)   Wt 147 lb 6.4 oz (66.9 kg)   SpO2 97%   BMI 26.96 kg/m?  ? ?Past Medical History:  ?Allergies, Anxiety, OA, C diff, Cataract, Diverticulitis, GERD, HH, HLD, Hypothyroidism, Osteopenia, Osteoporosis, Rosacea ? ?Past Surgical History:  ?She  has a past surgical history that includes Knee surgery; Back surgery; Laparoscopic liver cyst fenestration; Foot surgery (Right); Cholecystectomy; Tonsillectomy; Appendectomy; Tubal ligation; Open surgical repair of gluteal tendon (Left, 04/01/2014); Total knee arthroplasty (Right, 08/17/2015); Reverse shoulder arthroplasty (Right, 06/23/2017); and Cataract extraction, bilateral (11/2018). ? ?Brief Summary:  ?Tracy [name] is a 71 y.o. female with obstructive sleep apnea. ?  ? ? ? ?Subjective:  ? ?She was started on zoloft.  Her depression is much better, and with this her sleep has improved.  She is sleeping through the night now.  Using CPAP nightly.  Has nasal pillow mask.  Try a chin strap but didn't like this.  Has dry mouth in the morning.   ? ?Physical Exam:  ? ?Appearance - well kempt  ? ?ENMT - no sinus tenderness, no oral exudate, no LAN, Mallampati 3 airway, no stridor ? ?Respiratory - equal breath sounds bilaterally, no wheezing or rales ? ?CV - s1s2 regular rate and rhythm, no murmurs ? ?Ext - no clubbing, no edema ? ?Skin - no rashes ? ?Psych - normal mood and affect ? ? ?  ?Sleep Tests:  ?HST 06/17/20 >> AHI 64.6, SpO2 low 65% ?Auto CPAP 06/07/21 to 07/06/21 >> used on 30 of 30 nights with average 7 hrs 55 min.  Average AHI 1.9 with median CPAP 6 and 95 th percentile CPAP 7 cm H2O ? ?Cardiac Tests:  ?Echo 10/17/18 >> EF 60 to 65% ? ?Social History:  ?She  reports that  she has never smoked. She has never used smokeless tobacco. She reports that she does not drink alcohol and does not use drugs. ? ?Family History:  ?Her family history includes Bone cancer in her paternal grandfather; Hypertension in her mother; Liver disease in her father; Stroke in her mother. ?  ? ? ?Assessment/Plan:  ? ?Obstructive sleep apnea. ?- she is compliant with CPAP and reports benefit from therapy ?- she uses [facility] for her DME ?- she has some mouth dryness in the morning ?- will change her auto CPAP to 5 - 8 cm H2O ? ?Insomnia with depression. ?- improved since she was started on zoloft by her PCP ? ?Time Spent Involved in Patient Care on Day of Examination:  ?26 minutes ? ?Follow up:  ? ?Patient Instructions  ?Will have [facility] change your auto CPAP setting to 5 - 8 cm water pressure  ? ?Follow up in 1 year ? ?Medication List:  ? ?Allergies as of 07/07/2021   ? ?   Reactions  ? Augmentin [amoxicillin-pot Clavulanate] Diarrhea  ? Severe diarrhea  ? Demerol [meperidine] Nausea And Vomiting  ? Latex Rash, Other (See Comments), Hives  ? Reaction:  Blisters   ? Other Itching  ? Nylon sheets, pt prefers cotton sheets   ? Tramadol Nausea And Vomiting  ? ?  ? ?  ?Medication List  ?  ? ?  ?  Accurate as of Jul 07, 2021  4:50 PM. If you have any questions, ask your nurse or doctor.  ?  ?  ? ?  ? ?CALCIUM 600+D PO ?Take 1 tablet by mouth 2 (two) times daily. ?  ?fenofibrate 160 MG tablet ?Take 1 tablet (160 mg total) by mouth daily. ?  ?fluticasone 50 MCG/ACT nasal spray ?Commonly known as: FLONASE ?USE 1 SPRAY IN BOTH  NOSTRILS DAILY ?  ?melatonin 5 MG Tabs ?Take 5 mg by mouth at bedtime. ?  ?multivitamin with minerals Tabs tablet ?Take 1 tablet by mouth daily. ?  ?pantoprazole 40 MG tablet ?Commonly known as: PROTONIX ?Take 1 tablet (40 mg total) by mouth 2 (two) times daily. ?  ?polyethylene glycol 17 g packet ?Commonly known as: MIRALAX / GLYCOLAX ?Take 17 g by mouth daily. ?  ?sertraline 100 MG tablet ?Commonly  known as: ZOLOFT ?Take 1 tablet (100 mg total) by mouth daily. ?  ?Vitamin D 50 MCG (2000 UT) tablet ?Take 2,000 Units by mouth daily. ?  ?Xiidra 5 % Soln ?Generic drug: Lifitegrast ?Apply to eye. ?  ? ?  ? ? ?Signature:  ?Chesley Mires, MD ?[facility] ?Pager - [phone] ?07/07/2021, 4:50 PM ?  ? ? ? ? ? ? ? ? ?

## 2021-07-07 NOTE — Patient Instructions (Signed)
Will have [facility] change your auto CPAP setting to 5 - 8 cm water pressure  ? ?Follow up in 1 year ?

## 2021-07-17 DIAGNOSIS — G4733 Obstructive sleep apnea (adult) (pediatric): Secondary | ICD-10-CM | POA: Diagnosis not present

## 2021-07-21 ENCOUNTER — Encounter: Payer: Self-pay | Admitting: Family Medicine

## 2021-07-21 ENCOUNTER — Ambulatory Visit (INDEPENDENT_AMBULATORY_CARE_PROVIDER_SITE_OTHER): Payer: Medicare Other | Admitting: Family Medicine

## 2021-07-21 VITALS — BP 110/62 | HR 53 | Temp 98.3°F | Resp 17 | Ht 62.0 in | Wt 147.2 lb

## 2021-07-21 DIAGNOSIS — M159 Polyosteoarthritis, unspecified: Secondary | ICD-10-CM

## 2021-07-21 DIAGNOSIS — R252 Cramp and spasm: Secondary | ICD-10-CM

## 2021-07-21 DIAGNOSIS — K146 Glossodynia: Secondary | ICD-10-CM | POA: Diagnosis not present

## 2021-07-21 LAB — CBC WITH DIFFERENTIAL/PLATELET
Basophils Absolute: 0 10*3/uL (ref 0.0–0.1)
Basophils Relative: 0.8 % (ref 0.0–3.0)
Eosinophils Absolute: 0.1 10*3/uL (ref 0.0–0.7)
Eosinophils Relative: 2.8 % (ref 0.0–5.0)
HCT: 38.4 % (ref 36.0–46.0)
Hemoglobin: 12.8 g/dL (ref 12.0–15.0)
Lymphocytes Relative: 26.8 % (ref 12.0–46.0)
Lymphs Abs: 1.1 10*3/uL (ref 0.7–4.0)
MCHC: 33.4 g/dL (ref 30.0–36.0)
MCV: 90.6 fl (ref 78.0–100.0)
Monocytes Absolute: 0.4 10*3/uL (ref 0.1–1.0)
Monocytes Relative: 10.1 % (ref 3.0–12.0)
Neutro Abs: 2.5 10*3/uL (ref 1.4–7.7)
Neutrophils Relative %: 59.5 % (ref 43.0–77.0)
Platelets: 212 10*3/uL (ref 150.0–400.0)
RBC: 4.24 Mil/uL (ref 3.87–5.11)
RDW: 13.5 % (ref 11.5–15.5)
WBC: 4.3 10*3/uL (ref 4.0–10.5)

## 2021-07-21 LAB — BASIC METABOLIC PANEL
BUN: 22 mg/dL (ref 6–23)
CO2: 30 mEq/L (ref 19–32)
Calcium: 9.9 mg/dL (ref 8.4–10.5)
Chloride: 104 mEq/L (ref 96–112)
Creatinine, Ser: 1.09 mg/dL (ref 0.40–1.20)
GFR: 50.47 mL/min — ABNORMAL LOW (ref >60.00)
Glucose, Bld: 86 mg/dL (ref 70–99)
Potassium: 4.2 mEq/L (ref 3.5–5.1)
Sodium: 142 mEq/L (ref 135–145)

## 2021-07-21 LAB — B12 AND FOLATE PANEL
Folate: >24.2 ng/mL (ref >5.9)
Vitamin B-12: 328 pg/mL (ref 211–911)

## 2021-07-21 LAB — MAGNESIUM: Magnesium: 2 mg/dL (ref 1.5–2.5)

## 2021-07-21 MED ORDER — CELECOXIB 100 MG PO CAPS: 100.0000 mg | ORAL_CAPSULE | Freq: Two times a day (BID) | ORAL | 3 refills | Status: DC

## 2021-07-21 NOTE — Patient Instructions (Addendum)
Follow up as needed or as scheduled We'll notify you of your lab results and make any changes if needed Continue to drink LOTS of water Try either tonic water or mustard before bed to help w/ the cramping START Celebrex twice daily for pain and inflammation You can continue to use Tylenol as needed for pain Call with any questions or concerns Hang in there!

## 2021-07-21 NOTE — Assessment & Plan Note (Signed)
Recurrent problem for pt but these are more severe than usual and for the first time are occurring about the knees.  She reports good water intake but will check labs to ensure no magnesium or K+ deficits.  Discussed at home tx w/ tonic water or mustard.  Pt will see how things go.  Will follow.

## 2021-07-21 NOTE — Assessment & Plan Note (Signed)
Deteriorated.  Pt reports that in the last ~4 weeks pain has markedly worsened throughout her body.  She denies any changes to medication or activity level.  No dietary changes.  Will start Celebrex '100mg'$  BID w/ tylenol prn.  Pt expressed understanding and is in agreement w/ plan.

## 2021-07-21 NOTE — Assessment & Plan Note (Signed)
New.  sxs started abruptly 2-3 weeks ago.  Reports pain is less intense but still present and mouth feels 'swollen'.  PE WNL.  No notable swelling of mouth or tongue.  Will check labs to r/o vitamin deficiency as cause of pt's pain.  If no deficiencies to treat, will need to consider tx w/ Clonazepam or gabapentin.  Will follow.

## 2021-07-21 NOTE — Progress Notes (Signed)
   Subjective:    Patient ID: Tracy [name], female    DOB: 11/21/1950, 71 y.o.   MRN: [id number]  HPI Burning tongue- pt reports 2-3 weeks ago it 'felt like I drank scalding hot chocolate' but w/o actually burning her mouth.  Pain is less intense than at onset but still painful and mouth feels 'swollen'.  Joint pain- 'my arthritis is back w/ a passion'.  Pt reports she has been ignoring it for ~4 weeks but she is now having severe cramping of feet/ankles/lower legs/thighs.  Cramps are only occurring at night.  Pt reports excellent water intake.  Not taking any OTC meds for pain relief.  Had foot swelling w/ Aleve.   Review of Systems For ROS see HPI     Objective:   Physical Exam Vitals reviewed.  Constitutional:      General: She is not in acute distress.    Appearance: Normal appearance. She is not ill-appearing.  HENT:     Head: Normocephalic and atraumatic.     Mouth/Throat:     Mouth: Mucous membranes are moist.     Pharynx: Oropharynx is clear. No oropharyngeal exudate or posterior oropharyngeal erythema.  Musculoskeletal:     Right lower leg: No edema.     Left lower leg: No edema.  Lymphadenopathy:     Cervical: No cervical adenopathy.  Skin:    Findings: No bruising or rash.  Neurological:     General: No focal deficit present.     Mental Status: She is alert and oriented to person, place, and time.  Psychiatric:        Mood and Affect: Mood normal.        Behavior: Behavior normal.        Thought Content: Thought content normal.          Assessment & Plan:

## 2021-07-29 LAB — VITAMIN B6: Vitamin B6: 25 ng/mL — ABNORMAL HIGH (ref 2.1–21.7)

## 2021-07-29 LAB — VITAMIN B2(RIBOFLAVIN),PLASMA: Vitamin B2(Riboflavin),Plasma: 47.8 nmol/L — ABNORMAL HIGH (ref 6.2–39.0)

## 2021-07-29 LAB — IRON,TIBC AND FERRITIN PANEL
%SAT: 20 % (calc) (ref 16–45)
Ferritin: 146 ng/mL (ref 16–288)
Iron: 87 ug/dL (ref 45–160)
TIBC: 443 mcg/dL (calc) (ref 250–450)

## 2021-07-29 LAB — ZINC: Zinc: 60 ug/dL (ref 60–130)

## 2021-07-29 LAB — VITAMIN B1: Vitamin B1 (Thiamine): 40 nmol/L — ABNORMAL HIGH (ref 8–30)

## 2021-07-30 ENCOUNTER — Telehealth: Payer: Self-pay

## 2021-07-30 NOTE — Telephone Encounter (Signed)
-----   Message from [name], MD sent at 07/29/2021  7:28 AM EDT ----- So far labs look good!  Zinc is at low end of normal so starting a once daily OTC Zinc supplement is recommended.

## 2021-08-16 ENCOUNTER — Other Ambulatory Visit: Payer: Self-pay | Admitting: Family Medicine

## 2021-08-20 DIAGNOSIS — M7632 Iliotibial band syndrome, left leg: Secondary | ICD-10-CM | POA: Diagnosis not present

## 2021-08-20 DIAGNOSIS — M1712 Unilateral primary osteoarthritis, left knee: Secondary | ICD-10-CM | POA: Diagnosis not present

## 2021-08-26 ENCOUNTER — Encounter: Payer: Self-pay | Admitting: Family Medicine

## 2021-09-15 ENCOUNTER — Ambulatory Visit (INDEPENDENT_AMBULATORY_CARE_PROVIDER_SITE_OTHER): Payer: Medicare Other | Admitting: Family Medicine

## 2021-09-15 DIAGNOSIS — M810 Age-related osteoporosis without current pathological fracture: Secondary | ICD-10-CM | POA: Diagnosis not present

## 2021-09-15 MED ORDER — DENOSUMAB 60 MG/ML ~~LOC~~ SOSY
60.0000 mg | PREFILLED_SYRINGE | Freq: Once | SUBCUTANEOUS | Status: AC
  Administered 2021-09-15: 60 mg via SUBCUTANEOUS

## 2021-09-15 NOTE — Progress Notes (Signed)
Pt presents today for routine prolia injection notes no issues with past injections.

## 2021-10-07 ENCOUNTER — Other Ambulatory Visit: Payer: Self-pay

## 2021-10-07 ENCOUNTER — Encounter: Payer: Self-pay | Admitting: Family Medicine

## 2021-10-07 MED ORDER — SERTRALINE HCL 100 MG PO TABS: 100.0000 mg | ORAL_TABLET | Freq: Every day | ORAL | 1 refills | Status: DC

## 2021-10-15 ENCOUNTER — Encounter: Payer: Self-pay | Admitting: Family Medicine

## 2021-10-22 ENCOUNTER — Other Ambulatory Visit: Payer: Self-pay | Admitting: *Deleted

## 2021-10-22 NOTE — Patient Outreach (Signed)
  Care Coordination   10/22/2021 Name: Tracy [name] MRN: [id number] DOB: 1950-10-05   Care Coordination Outreach Attempts:  An unsuccessful telephone outreach was attempted today to offer the patient information about available care coordination services as a benefit of their health plan.   Follow Up Plan:  Additional outreach attempts will be made to offer the patient care coordination information and services.   Encounter Outcome:  No Answer  Care Coordination Interventions Activated:  No   Care Coordination Interventions:  No, not indicated    Raina Mina, RN Care Management Coordinator [place] Office [phone]

## 2021-10-28 DIAGNOSIS — Z1231 Encounter for screening mammogram for malignant neoplasm of breast: Secondary | ICD-10-CM | POA: Diagnosis not present

## 2021-10-29 DIAGNOSIS — M25562 Pain in left knee: Secondary | ICD-10-CM | POA: Diagnosis not present

## 2021-10-29 LAB — HM MAMMOGRAPHY

## 2021-11-02 ENCOUNTER — Encounter: Payer: Self-pay | Admitting: Family Medicine

## 2021-11-04 DIAGNOSIS — L570 Actinic keratosis: Secondary | ICD-10-CM | POA: Diagnosis not present

## 2021-11-04 DIAGNOSIS — L821 Other seborrheic keratosis: Secondary | ICD-10-CM | POA: Diagnosis not present

## 2021-11-04 DIAGNOSIS — Z85828 Personal history of other malignant neoplasm of skin: Secondary | ICD-10-CM | POA: Diagnosis not present

## 2021-11-04 DIAGNOSIS — D225 Melanocytic nevi of trunk: Secondary | ICD-10-CM | POA: Diagnosis not present

## 2021-11-10 ENCOUNTER — Ambulatory Visit: Payer: Self-pay

## 2021-11-10 NOTE — Patient Outreach (Signed)
  Care Coordination   11/10/2021 Name: Tracy [name] MRN: [id number] DOB: 01/22/1951   Care Coordination Outreach Attempts:  A second unsuccessful outreach was attempted today to offer the patient with information about available care coordination services as a benefit of their health plan.     Follow Up Plan:  Additional outreach attempts will be made to offer the patient care coordination information and services.   Encounter Outcome:  No Answer  Care Coordination Interventions Activated:  No   Care Coordination Interventions:  No, not indicated    Daneen Schick, BSW, CDP Social Worker, Certified Dementia Practitioner [facility]  Care Coordination [phone]

## 2021-11-12 DIAGNOSIS — M25562 Pain in left knee: Secondary | ICD-10-CM | POA: Diagnosis not present

## 2021-11-15 ENCOUNTER — Ambulatory Visit: Payer: Self-pay

## 2021-11-15 NOTE — Patient Outreach (Signed)
  Care Coordination   11/15/2021 Name: Tracy [name] MRN: [id number] DOB: [birth year]   Care Coordination Outreach Attempts:  A third unsuccessful outreach was attempted today to offer the patient with information about available care coordination services as a benefit of their health plan.   Follow Up Plan:  No further outreach attempts will be made at this time. We have been unable to contact the patient to offer or enroll patient in care coordination services  Encounter Outcome:  No Answer  Care Coordination Interventions Activated:  No   Care Coordination Interventions:  No, not indicated    Daneen Schick, BSW, CDP Social Worker, Certified Dementia Practitioner [place] Coordination [phone]

## 2021-11-17 ENCOUNTER — Ambulatory Visit (INDEPENDENT_AMBULATORY_CARE_PROVIDER_SITE_OTHER): Payer: Medicare Other | Admitting: Family Medicine

## 2021-11-17 ENCOUNTER — Encounter: Payer: Self-pay | Admitting: Family Medicine

## 2021-11-17 VITALS — BP 116/80 | HR 60 | Temp 98.2°F | Resp 16 | Ht 62.0 in | Wt 145.0 lb

## 2021-11-17 DIAGNOSIS — F418 Other specified anxiety disorders: Secondary | ICD-10-CM

## 2021-11-17 DIAGNOSIS — R5383 Other fatigue: Secondary | ICD-10-CM | POA: Diagnosis not present

## 2021-11-17 LAB — BASIC METABOLIC PANEL
BUN: 25 mg/dL — ABNORMAL HIGH (ref 6–23)
CO2: 31 mEq/L (ref 19–32)
Calcium: 10.1 mg/dL (ref 8.4–10.5)
Chloride: 106 mEq/L (ref 96–112)
Creatinine, Ser: 1.16 mg/dL (ref 0.40–1.20)
GFR: 46.73 mL/min — ABNORMAL LOW (ref >60.00)
Glucose, Bld: 89 mg/dL (ref 70–99)
Potassium: 4.6 mEq/L (ref 3.5–5.1)
Sodium: 142 mEq/L (ref 135–145)

## 2021-11-17 LAB — HEPATIC FUNCTION PANEL
ALT: 11 U/L (ref 0–35)
AST: 20 U/L (ref 0–37)
Albumin: 3.9 g/dL (ref 3.5–5.2)
Alkaline Phosphatase: 27 U/L — ABNORMAL LOW (ref 39–117)
Bilirubin, Direct: 0.1 mg/dL (ref 0.0–0.3)
Total Bilirubin: 0.5 mg/dL (ref 0.2–1.2)
Total Protein: 6.8 g/dL (ref 6.0–8.3)

## 2021-11-17 LAB — CBC WITH DIFFERENTIAL/PLATELET
Basophils Absolute: 0 10*3/uL (ref 0.0–0.1)
Basophils Relative: 0.5 % (ref 0.0–3.0)
Eosinophils Absolute: 0.1 10*3/uL (ref 0.0–0.7)
Eosinophils Relative: 1.4 % (ref 0.0–5.0)
HCT: 38.8 % (ref 36.0–46.0)
Hemoglobin: 12.8 g/dL (ref 12.0–15.0)
Lymphocytes Relative: 16.9 % (ref 12.0–46.0)
Lymphs Abs: 1.2 10*3/uL (ref 0.7–4.0)
MCHC: 33.1 g/dL (ref 30.0–36.0)
MCV: 90.4 fl (ref 78.0–100.0)
Monocytes Absolute: 0.6 10*3/uL (ref 0.1–1.0)
Monocytes Relative: 8.6 % (ref 3.0–12.0)
Neutro Abs: 5.3 10*3/uL (ref 1.4–7.7)
Neutrophils Relative %: 72.6 % (ref 43.0–77.0)
Platelets: 194 10*3/uL (ref 150.0–400.0)
RBC: 4.29 Mil/uL (ref 3.87–5.11)
RDW: 13.7 % (ref 11.5–15.5)
WBC: 7.4 10*3/uL (ref 4.0–10.5)

## 2021-11-17 LAB — VITAMIN B12: Vitamin B-12: 385 pg/mL (ref 211–911)

## 2021-11-17 LAB — VITAMIN D 25 HYDROXY (VIT D DEFICIENCY, FRACTURES): VITD: 66.89 ng/mL (ref 30.00–100.00)

## 2021-11-17 LAB — TSH: TSH: 0.33 u[IU]/mL — ABNORMAL LOW (ref 0.35–5.50)

## 2021-11-17 MED ORDER — SERTRALINE HCL 25 MG PO TABS: 25.0000 mg | ORAL_TABLET | Freq: Every day | ORAL | 0 refills | Status: DC

## 2021-11-17 NOTE — Progress Notes (Signed)
   Subjective:    Patient ID: Tracy [name], female    DOB: Oct 16, 1950, 71 y.o.   MRN: [id number]  HPI Depression w/ anxiety- pt reports she wants to get off Zoloft b/c she reports brain fog and HAs- which she didn't have before.  Reports increased fatigue.  Reports sleeping well.  Continues to walk regularly.  She is concerned that her previous fall where she hit her face on the concrete has caused some sort of issue.  Does admit to feeling overwhelmed and sad at times.  'i get easily agitated'.   Review of Systems For ROS see HPI     Objective:   Physical Exam Vitals reviewed.  Constitutional:      General: She is not in acute distress.    Appearance: Normal appearance. She is not ill-appearing.  HENT:     Head: Normocephalic and atraumatic.  Eyes:     General:        Right eye: No discharge.        Left eye: No discharge.     Extraocular Movements: Extraocular movements intact.     Conjunctiva/sclera: Conjunctivae normal.     Pupils: Pupils are equal, round, and reactive to light.  Cardiovascular:     Rate and Rhythm: Normal rate and regular rhythm.     Heart sounds: Normal heart sounds.  Pulmonary:     Effort: Pulmonary effort is normal. No respiratory distress.     Breath sounds: Normal breath sounds. No wheezing or rhonchi.  Musculoskeletal:     Cervical back: Normal range of motion and neck supple.  Lymphadenopathy:     Cervical: No cervical adenopathy.  Skin:    General: Skin is warm and dry.  Neurological:     General: No focal deficit present.     Mental Status: She is alert and oriented to person, place, and time.  Psychiatric:        Mood and Affect: Mood normal.        Behavior: Behavior normal.        Thought Content: Thought content normal.           Assessment & Plan:  Fatigue- pt reports increased fatigue recently which is odd bc she was feeling much better after starting her CPAP.  She feels that her fatigue and 'brain fog' are related to the  Sertraline and she wants to wean off the medication.  Provided her a weaning scheduled- '50mg'$  x2 weeks and then '25mg'$  x2 weeks and then stop.  Will check labs to determine if there is a metabolic cause to her fatigue.  Will follow  Anxiety/depression- pt admits that she still finds herself overwhelmed, sad, and irritable.  Discussed that this could worsen as we wean the Sertraline.  Offered to start or change SSRI but she prefers to have a washout period and see how things go.  Will follow closely.

## 2021-11-17 NOTE — Progress Notes (Signed)
Informed pt of lab results  

## 2021-11-17 NOTE — Patient Instructions (Addendum)
Follow up in 6-8 weeks to recheck mood We'll notify you of your lab results and make any changes if needed DECREASE the Sertraline (Zoloft) to 1/2 tab x2 weeks THEN take the Sertraline '25mg'$  x2 weeks and STOP Make sure you are drinking plenty of fluids and taking a daily OTC allergy medication to help w/ headaches Call with any questions or concerns Hang in there!

## 2021-11-19 ENCOUNTER — Ambulatory Visit (INDEPENDENT_AMBULATORY_CARE_PROVIDER_SITE_OTHER): Payer: Medicare Other | Admitting: Family Medicine

## 2021-11-19 ENCOUNTER — Encounter: Payer: Self-pay | Admitting: Family Medicine

## 2021-11-19 VITALS — BP 116/68 | HR 60 | Temp 97.8°F | Resp 16 | Ht 62.0 in | Wt 144.2 lb

## 2021-11-19 DIAGNOSIS — R109 Unspecified abdominal pain: Secondary | ICD-10-CM | POA: Diagnosis not present

## 2021-11-19 LAB — POCT URINALYSIS DIPSTICK
Blood, UA: NEGATIVE
Glucose, UA: NEGATIVE
Leukocytes, UA: NEGATIVE
Protein, UA: NEGATIVE
Spec Grav, UA: 1.015 (ref 1.010–1.025)
Urobilinogen, UA: 0.2 E.U./dL
pH, UA: 6.5 (ref 5.0–8.0)

## 2021-11-19 LAB — CBC WITH DIFFERENTIAL/PLATELET
Basophils Absolute: 0.1 10*3/uL (ref 0.0–0.1)
Basophils Relative: 0.8 % (ref 0.0–3.0)
Eosinophils Absolute: 0.1 10*3/uL (ref 0.0–0.7)
Eosinophils Relative: 2.1 % (ref 0.0–5.0)
HCT: 39.6 % (ref 36.0–46.0)
Hemoglobin: 13.4 g/dL (ref 12.0–15.0)
Lymphocytes Relative: 22 % (ref 12.0–46.0)
Lymphs Abs: 1.5 10*3/uL (ref 0.7–4.0)
MCHC: 33.8 g/dL (ref 30.0–36.0)
MCV: 90.4 fl (ref 78.0–100.0)
Monocytes Absolute: 0.6 10*3/uL (ref 0.1–1.0)
Monocytes Relative: 8.9 % (ref 3.0–12.0)
Neutro Abs: 4.4 10*3/uL (ref 1.4–7.7)
Neutrophils Relative %: 66.2 % (ref 43.0–77.0)
Platelets: 208 10*3/uL (ref 150.0–400.0)
RBC: 4.38 Mil/uL (ref 3.87–5.11)
RDW: 13.9 % (ref 11.5–15.5)
WBC: 6.7 10*3/uL (ref 4.0–10.5)

## 2021-11-19 LAB — AMYLASE: Amylase: 33 U/L (ref 27–131)

## 2021-11-19 LAB — LIPASE: Lipase: 15 U/L (ref 11.0–59.0)

## 2021-11-19 MED ORDER — SULFAMETHOXAZOLE-TRIMETHOPRIM 800-160 MG PO TABS: 1.0000 | ORAL_TABLET | Freq: Two times a day (BID) | ORAL | 0 refills | Status: DC

## 2021-11-19 NOTE — Progress Notes (Signed)
Informed pt of lab results  

## 2021-11-19 NOTE — Progress Notes (Signed)
   Subjective:    Patient ID: Tracy [name], female    DOB: [birth year], 71 y.o.   MRN: [id number]  HPI Abd pain- pt reports pain is under L rib.  Sxs started on Monday.  Initially thought it was pain due to walking but feels like pain is worsening.  Pt reports area is TTP and feels 'swollen'.  Has pain w/ deep breathing.  + nausea.  No vomiting.  + chills but no fever.  Had 2 nights of night sweats which is very unusual for her.  Had CBC 2 days ago- WNL.  No burning w/ urination, no pain.  Denies urinary frequency.   Review of Systems For ROS see HPI     Objective:   Physical Exam Vitals reviewed.  Constitutional:      General: She is not in acute distress.    Appearance: She is well-developed. She is ill-appearing.  HENT:     Head: Normocephalic and atraumatic.  Pulmonary:     Effort: Pulmonary effort is normal.     Breath sounds: Normal breath sounds. No wheezing, rhonchi or rales.  Abdominal:     General: There is no distension.     Palpations: Abdomen is soft. There is no splenomegaly or mass.     Tenderness: There is abdominal tenderness in the left upper quadrant and left lower quadrant. There is no guarding or rebound.  Skin:    General: Skin is warm and dry.     Findings: No rash.  Neurological:     General: No focal deficit present.     Mental Status: She is alert.  Psychiatric:        Mood and Affect: Mood normal.        Behavior: Behavior normal.           Assessment & Plan:   L sided abdominal pain- new.  Pt reports this is higher than her usual diverticulitis but otherwise feels similar.  Her sweats and chills (even without recorded fever) are concerning for infection.  She has hx of perforation w/ diverticulitis in the past which makes me more inclined to treat early w/ abx.  Will start Bactrim DS as pt can't tolerate Augmentin and also had issues w/ Cipro/Flagyl.  Did discuss CT scan but she has had 6 in the past 5 years (and more prior to that) which  concerns me about her level of radiation.  Will hold off on imaging at this time but pt aware that if sxs change or worsen over the weekend she is to go to ER for evaluation and tx.  Pt expressed understanding and is in agreement w/ plan.

## 2021-11-19 NOTE — Patient Instructions (Addendum)
Follow up as needed or as scheduled We'll notify you of your lab results and make any changes if needed START the Trimethoprim Sulfa (Bactrim) twice daily Liquid diet until pain improves If pain doesn't improve, changes, or worsens please call GI and/or go to the ER Call with any questions or concerns Hang in there!!!

## 2021-11-29 ENCOUNTER — Telehealth: Payer: Self-pay | Admitting: Gastroenterology

## 2021-11-29 NOTE — Telephone Encounter (Signed)
Hello Doctors, patient is requesting a transfer of care from [facility] to Dr. Hilarie Fredrickson, she really don't have a reason, just states  is not able to get  sooner's  appointment with her current provider. Please advise

## 2021-12-01 NOTE — Telephone Encounter (Signed)
Sure not a problem

## 2021-12-01 NOTE — Telephone Encounter (Signed)
[place] with me if ok with Dr. Silverio Decamp The patient's husband is also my patient That said she should know that the wait for office visit with me is also often long (in absence of cancellations) and due to my administrative duties I have less clinic time than Dr. Silverio Decamp Our APPs are also always available to see patients sooner than I when urgent issues arise. I have high confidence in our APPs. JMP

## 2021-12-03 ENCOUNTER — Ambulatory Visit: Payer: Medicare Other | Admitting: Family Medicine

## 2021-12-09 ENCOUNTER — Other Ambulatory Visit: Payer: Self-pay | Admitting: Family Medicine

## 2021-12-24 DIAGNOSIS — G4733 Obstructive sleep apnea (adult) (pediatric): Secondary | ICD-10-CM | POA: Diagnosis not present

## 2021-12-29 ENCOUNTER — Ambulatory Visit (INDEPENDENT_AMBULATORY_CARE_PROVIDER_SITE_OTHER): Payer: Medicare Other | Admitting: Family Medicine

## 2021-12-29 ENCOUNTER — Encounter: Payer: Self-pay | Admitting: Family Medicine

## 2021-12-29 VITALS — BP 116/78 | HR 57 | Temp 97.0°F | Resp 16 | Ht 62.0 in | Wt 149.1 lb

## 2021-12-29 DIAGNOSIS — F418 Other specified anxiety disorders: Secondary | ICD-10-CM | POA: Diagnosis not present

## 2021-12-29 NOTE — Assessment & Plan Note (Signed)
Ongoing issue.  Feels that head has cleared since stopping medication but mood is only 'so-so'.  She would like to avoid medication at this time but is open to the idea of counseling since she has never tried that before.  Referral placed.  Had long discussion that she doesn't need to 'get through it' on her own.  Will continue to follow.

## 2021-12-29 NOTE — Patient Instructions (Addendum)
Schedule your complete physical for January They should call you to set up counseling I'm proud of you for taking this step!!  You DESERVE to feel better! Call with any questions or concerns Hang in there!

## 2021-12-29 NOTE — Progress Notes (Signed)
   Subjective:    Patient ID: Tracy [name], female    DOB: Jul 21, 1950, 71 y.o.   MRN: [id number]  HPI Depression w/ anxiety- pt wanted to wean off all medication after last visit.  She is off her Sertraline.  She says things are currently ''so-so'.  Pt has been off medication x2-3 weeks.  'i just don't want to use those things any more in my brain.  I prefer to get through it on my own'.  Doesn't feel that any of the medications have worked.  Previously on Cymbalta, Prozac, Sertraline.     Review of Systems For ROS see HPI     Objective:   Physical Exam Vitals reviewed.  Constitutional:      General: She is not in acute distress.    Appearance: Normal appearance. She is not ill-appearing.  HENT:     Head: Normocephalic and atraumatic.  Eyes:     Extraocular Movements: Extraocular movements intact.     Conjunctiva/sclera: Conjunctivae normal.     Pupils: Pupils are equal, round, and reactive to light.  Skin:    General: Skin is warm and dry.  Neurological:     General: No focal deficit present.     Mental Status: She is alert and oriented to person, place, and time.  Psychiatric:        Mood and Affect: Mood normal.        Behavior: Behavior normal.        Thought Content: Thought content normal.           Assessment & Plan:

## 2022-01-10 ENCOUNTER — Encounter (HOSPITAL_BASED_OUTPATIENT_CLINIC_OR_DEPARTMENT_OTHER): Payer: Self-pay | Admitting: Pulmonary Disease

## 2022-02-01 ENCOUNTER — Ambulatory Visit: Payer: Medicare Other | Admitting: Psychology

## 2022-02-04 ENCOUNTER — Ambulatory Visit (INDEPENDENT_AMBULATORY_CARE_PROVIDER_SITE_OTHER): Payer: Medicare Other | Admitting: Family Medicine

## 2022-02-04 ENCOUNTER — Encounter: Payer: Self-pay | Admitting: Family Medicine

## 2022-02-04 VITALS — BP 116/70 | HR 61 | Temp 99.1°F | Resp 17 | Ht 62.0 in | Wt 150.2 lb

## 2022-02-04 DIAGNOSIS — M7062 Trochanteric bursitis, left hip: Secondary | ICD-10-CM

## 2022-02-04 MED ORDER — PREDNISONE 10 MG PO TABS: ORAL_TABLET | ORAL | 0 refills | Status: DC

## 2022-02-04 NOTE — Patient Instructions (Signed)
Follow up as needed or as scheduled START the Prednisone as directed- take w/ food.  3 pills at the same time x3 days, then 2 pills at the same time x3 days, and then 1 pill daily ICE!!! If no improvement, call ortho and they can do an injection Call with any questions or concerns Stay Safe!  Stay Healthy! Hang in there!!! Happy Holidays!!!

## 2022-02-04 NOTE — Progress Notes (Signed)
   Subjective:    Patient ID: Tracy [name], female    DOB: Apr 24, 1950, 71 y.o.   MRN: [id number]  HPI Hip pain- 'i think it's my trochanteric bursitis'.  Pt reports she can hardly sit.  Able to walk but unable to sit or lie down.  L sided.  Very TTP over lateral hip.  Sxs have been severe for 10 days.  Pt has been taking Tylenol w/o relief.     Review of Systems For ROS see HPI     Objective:   Physical Exam Vitals reviewed.  Constitutional:      General: She is not in acute distress.    Appearance: Normal appearance. She is not ill-appearing.  HENT:     Head: Normocephalic and atraumatic.  Cardiovascular:     Pulses: Normal pulses.  Musculoskeletal:        General: Tenderness (pt point tender over L greater trochanter, no TTP on R) present. No swelling.  Skin:    General: Skin is warm and dry.  Neurological:     Mental Status: She is alert and oriented to person, place, and time. Mental status is at baseline.           Assessment & Plan:  Trochanteric bursitis- Pt's sxs are similar to previous episode and she nearly jumped out of the chair when I palpated the area.  Again L sided.  Start Prednisone taper.  Reviewed supportive care and red flags that should prompt return.  Pt expressed understanding and is in agreement w/ plan.

## 2022-02-16 DIAGNOSIS — M19012 Primary osteoarthritis, left shoulder: Secondary | ICD-10-CM | POA: Diagnosis not present

## 2022-02-16 DIAGNOSIS — M19112 Post-traumatic osteoarthritis, left shoulder: Secondary | ICD-10-CM | POA: Diagnosis not present

## 2022-03-19 ENCOUNTER — Other Ambulatory Visit: Payer: Self-pay | Admitting: Family Medicine

## 2022-03-21 ENCOUNTER — Encounter: Payer: Self-pay | Admitting: Family Medicine

## 2022-03-21 DIAGNOSIS — G4733 Obstructive sleep apnea (adult) (pediatric): Secondary | ICD-10-CM | POA: Diagnosis not present

## 2022-03-22 ENCOUNTER — Telehealth: Payer: Self-pay

## 2022-03-22 NOTE — Telephone Encounter (Signed)
Prolia VOB initiated via [email or web address]

## 2022-03-24 ENCOUNTER — Telehealth: Payer: Self-pay

## 2022-03-24 NOTE — Telephone Encounter (Signed)
Received a notice stating pt Prolia needs a PA

## 2022-03-25 NOTE — Telephone Encounter (Signed)
Patient Advocate Encounter  Prior Authorization for [place] has been approved.    PA# [id number] Effective dates: 03/25/22 through 03/26/23

## 2022-03-25 NOTE — Telephone Encounter (Signed)
Pt ready for scheduling on or after 03/25/22  Out-of-pocket cost due at time of visit: $327  Primary: [employer] - Medicare Prolia co-insurance: 20% (approximately $302) Admin fee co-insurance: 20% (approximately $30)  Deductible: no  Secondary:  Prolia co-insurance:  Admin fee co-insurance:   Deductible:   Prior Auth: approved PA# [id number] Valid: 03/25/22 to 03/26/23  ** This summary of benefits is an estimation of the patient's out-of-pocket cost. Exact cost may vary based on individual plan coverage.

## 2022-03-25 NOTE — Telephone Encounter (Signed)
Called and LM for pt letting her know approved and pick up and pharmacy

## 2022-03-25 NOTE — Telephone Encounter (Signed)
Informed pt that she has been approved for her Prolia and her out of pt pay at the time of visit is $327. Pt expressed verbal understanding and we will scheudle her next injection

## 2022-03-29 ENCOUNTER — Ambulatory Visit (INDEPENDENT_AMBULATORY_CARE_PROVIDER_SITE_OTHER): Payer: Medicare Other | Admitting: Family

## 2022-03-29 VITALS — BP 114/60 | HR 71 | Temp 98.1°F | Ht 62.0 in | Wt 153.6 lb

## 2022-03-29 DIAGNOSIS — B3731 Acute candidiasis of vulva and vagina: Secondary | ICD-10-CM | POA: Diagnosis not present

## 2022-03-29 MED ORDER — NYSTATIN 100000 UNIT/GM EX CREA: 1.0000 | TOPICAL_CREAM | Freq: Two times a day (BID) | CUTANEOUS | 0 refills | Status: DC

## 2022-03-29 MED ORDER — FLUCONAZOLE 150 MG PO TABS: 150.0000 mg | ORAL_TABLET | ORAL | 0 refills | Status: DC

## 2022-03-30 NOTE — Progress Notes (Signed)
Acute Office Visit  Subjective:     Patient ID: Tracy [name], female    DOB: [birth year], 72 y.o.   MRN: [id number]  Chief Complaint  Patient presents with  . Vaginitis    Notes starting Saturday had itching tried 1 day monostat Monday felt like she still had something going on, feels harm, denies current discharge, notes did have a lot post monostat     HPI Patient is in today with c/o vaginal itching and discharge x 3 days. She is not sexually active. She has used one dose monistat that has not helped as quickly as she would have liked. No foul odor. No bleeding.   Review of Systems  Genitourinary:        Vaginal itching and irritation  All other systems reviewed and are negative. Past Medical History:  Diagnosis Date  . Allergy   . Anxiety   . Arthritis   . Bursitis of left hip   . C. difficile diarrhea   . Cancer (HCC)    basil cell  . Cataract    bilateral  . Complication of anesthesia   . Diverticulitis   . GERD (gastroesophageal reflux disease)   . Hx: UTI (urinary tract infection)   . Hyperlipidemia   . Hyperthyroidism   . Osteopenia   . Osteoporosis   . PONV (postoperative nausea and vomiting)   . Rosacea   . Rotator cuff arthropathy, left     Social History   Socioeconomic History  . Marital status: Married    Spouse name: Not on file  . Number of children: Not on file  . Years of education: Not on file  . Highest education level: Not on file  Occupational History  . Not on file  Tobacco Use  . Smoking status: Never  . Smokeless tobacco: Never  Vaping Use  . Vaping Use: Never used  Substance and Sexual Activity  . Alcohol use: No  . Drug use: No  . Sexual activity: Not Currently  Other Topics Concern  . Not on file  Social History Narrative  . Not on file   Social Determinants of Health   Financial Resource Strain: Low Risk  (06/24/2021)   Overall Financial Resource Strain (CARDIA)   . Difficulty of Paying Living Expenses: Not  hard at all  Food Insecurity: No Food Insecurity (06/24/2021)   Hunger Vital Sign   . Worried About Charity fundraiser in the Last Year: Never true   . Ran Out of Food in the Last Year: Never true  Transportation Needs: No Transportation Needs (06/24/2021)   PRAPARE - Transportation   . Lack of Transportation (Medical): No   . Lack of Transportation (Non-Medical): No  Physical Activity: Sufficiently Active (06/24/2021)   Exercise Vital Sign   . Days of Exercise per Week: 7 days   . Minutes of Exercise per Session: 30 min  Stress: No Stress Concern Present (06/24/2021)   [place]   . Feeling of Stress : Not at all  Social Connections: Moderately Integrated (06/24/2021)   Social Connection and Isolation Panel [NHANES]   . Frequency of Communication with Friends and Family: Three times a week   . Frequency of Social Gatherings with Friends and Family: Three times a week   . Attends Religious Services: More than 4 times per year   . Active Member of Clubs or Organizations: No   . Attends Archivist Meetings: Never   .  Marital Status: Married  Human resources officer Violence: Not At Risk (06/24/2021)   Humiliation, Afraid, Rape, and Kick questionnaire   . Fear of Current or Ex-Partner: No   . Emotionally Abused: No   . Physically Abused: No   . Sexually Abused: No    Past Surgical History:  Procedure Laterality Date  . APPENDECTOMY    . BACK SURGERY    . CATARACT EXTRACTION, BILATERAL  11/2018  . CHOLECYSTECTOMY    . FOOT SURGERY Right    X 2  . KNEE SURGERY    . LAPAROSCOPIC LIVER CYST FENESTRATION    . OPEN SURGICAL REPAIR OF GLUTEAL TENDON Left 04/01/2014   Procedure: LEFT HIP BURSECTOMY WITH GLUTEAL TENDON REPAIR;  Surgeon: Gearlean Alf, MD;  Location: [facility];  Service: Orthopedics;  Laterality: Left;  . REVERSE SHOULDER ARTHROPLASTY Right 06/23/2017   Procedure: RIGHT REVERSE SHOULDER ARTHROPLASTY;   Surgeon: Netta Cedars, MD;  Location: [facility];  Service: Orthopedics;  Laterality: Right;  . TONSILLECTOMY    . TOTAL KNEE ARTHROPLASTY Right 08/17/2015   Procedure: RIGHT TOTAL KNEE ARTHROPLASTY;  Surgeon: Gaynelle Arabian, MD;  Location: [facility];  Service: Orthopedics;  Laterality: Right;  . TUBAL LIGATION      Family History  Problem Relation Age of Onset  . Hypertension Mother   . Stroke Mother   . Liver disease Father        alcohol related  . Bone cancer Paternal Grandfather   . Cancer Neg Hx   . Diabetes Neg Hx   . Colon cancer Neg Hx   . Esophageal cancer Neg Hx   . Pancreatic cancer Neg Hx   . Stomach cancer Neg Hx     Allergies  Allergen Reactions  . Amoxicillin-Pot Clavulanate Diarrhea    Severe diarrhea Severe diarrhea Severe diarrhea  . Nylon Hives  . Demerol [Meperidine] Nausea And Vomiting  . Latex Rash, Other (See Comments) and Hives    Reaction:  Blisters   . Other Itching    Nylon sheets, pt prefers cotton sheets   . Tramadol Nausea And Vomiting    Current Outpatient Medications on File Prior to Visit  Medication Sig Dispense Refill  . Calcium Carbonate-Vitamin D (CALCIUM 600+D PO) Take 1 tablet by mouth 2 (two) times daily.    . Cholecalciferol (VITAMIN D) 2000 units tablet Take 2,000 Units by mouth daily.    . fenofibrate 160 MG tablet TAKE 1 TABLET BY MOUTH DAILY 100 tablet 2  . fluticasone (FLONASE) 50 MCG/ACT nasal spray USE 1 SPRAY IN BOTH NOSTRILS  DAILY 32 g 3  . Lifitegrast (XIIDRA) 5 % SOLN Apply to eye.    . Melatonin 5 MG TABS Take 5 mg by mouth at bedtime.    . Multiple Vitamin (MULTIVITAMIN WITH MINERALS) TABS tablet Take 1 tablet by mouth daily.    . pantoprazole (PROTONIX) 40 MG tablet TAKE 1 TABLET BY MOUTH TWICE  DAILY 180 tablet 3  . polyethylene glycol (MIRALAX / GLYCOLAX) packet Take 17 g by mouth daily. 14 each 0  . predniSONE (DELTASONE) 10 MG tablet 3 tabs x3 days and then 2 tabs x3 days and then 1 tab x3 days.  Take w/ food. 18  tablet 0   No current facility-administered medications on file prior to visit.    BP 114/60   Pulse 71   Temp 98.1 F (36.7 C) (Oral)   Ht '5\' 2"'$  (1.575 m)   Wt 153 lb 9.6 oz (69.7 kg)  SpO2 96%   BMI 28.09 kg/m chart      Objective:    BP 114/60   Pulse 71   Temp 98.1 F (36.7 C) (Oral)   Ht '5\' 2"'$  (1.575 m)   Wt 153 lb 9.6 oz (69.7 kg)   SpO2 96%   BMI 28.09 kg/m    Physical Exam Vitals and nursing note reviewed.  Constitutional:      Appearance: Normal appearance.  Cardiovascular:     Rate and Rhythm: Normal rate and regular rhythm.     Pulses: Normal pulses.     Heart sounds: Normal heart sounds.  Pulmonary:     Effort: Pulmonary effort is normal.     Breath sounds: Normal breath sounds.  Musculoskeletal:     Cervical back: Normal range of motion and neck supple.  Neurological:     General: No focal deficit present.     Mental Status: She is alert and oriented to person, place, and time.  Psychiatric:        Mood and Affect: Mood normal.        Behavior: Behavior normal.   No results found for any visits on 03/29/22.      Assessment & Plan:   Problem List Items Addressed This Visit   None Visit Diagnoses     Vaginal yeast infection    -  Primary   Relevant Medications   fluconazole (DIFLUCAN) 150 MG tablet   nystatin cream (MYCOSTATIN)       Meds ordered this encounter  Medications  . fluconazole (DIFLUCAN) 150 MG tablet    Sig: Take 1 tablet (150 mg total) by mouth once a week.    Dispense:  2 tablet    Refill:  0  . nystatin cream (MYCOSTATIN)    Sig: Apply 1 Application topically 2 (two) times daily.    Dispense:  30 g    Refill:  0   Call the office if symptoms worsen or persist. Recheck as scheduled  and sooner as needed.   No follow-ups on file.  Kennyth Arnold, FNP

## 2022-04-06 ENCOUNTER — Telehealth: Payer: Self-pay | Admitting: Family Medicine

## 2022-04-06 ENCOUNTER — Encounter: Payer: Self-pay | Admitting: Family Medicine

## 2022-04-06 ENCOUNTER — Ambulatory Visit (INDEPENDENT_AMBULATORY_CARE_PROVIDER_SITE_OTHER): Payer: Medicare Other | Admitting: Family Medicine

## 2022-04-06 ENCOUNTER — Other Ambulatory Visit (HOSPITAL_COMMUNITY): Payer: Self-pay

## 2022-04-06 VITALS — BP 126/74 | HR 59 | Temp 98.8°F | Resp 16 | Ht 62.0 in | Wt 152.2 lb

## 2022-04-06 DIAGNOSIS — E663 Overweight: Secondary | ICD-10-CM

## 2022-04-06 DIAGNOSIS — Z0000 Encounter for general adult medical examination without abnormal findings: Secondary | ICD-10-CM

## 2022-04-06 DIAGNOSIS — M810 Age-related osteoporosis without current pathological fracture: Secondary | ICD-10-CM | POA: Diagnosis not present

## 2022-04-06 LAB — HEPATIC FUNCTION PANEL
ALT: 14 U/L (ref 0–35)
AST: 25 U/L (ref 0–37)
Albumin: 4.4 g/dL (ref 3.5–5.2)
Alkaline Phosphatase: 29 U/L — ABNORMAL LOW (ref 39–117)
Bilirubin, Direct: 0.1 mg/dL (ref 0.0–0.3)
Total Bilirubin: 0.4 mg/dL (ref 0.2–1.2)
Total Protein: 7.2 g/dL (ref 6.0–8.3)

## 2022-04-06 LAB — BASIC METABOLIC PANEL
BUN: 21 mg/dL (ref 6–23)
CO2: 31 mEq/L (ref 19–32)
Calcium: 10.3 mg/dL (ref 8.4–10.5)
Chloride: 105 mEq/L (ref 96–112)
Creatinine, Ser: 1.03 mg/dL (ref 0.40–1.20)
GFR: 53.75 mL/min — ABNORMAL LOW (ref >60.00)
Glucose, Bld: 92 mg/dL (ref 70–99)
Potassium: 4.5 mEq/L (ref 3.5–5.1)
Sodium: 143 mEq/L (ref 135–145)

## 2022-04-06 LAB — CBC WITH DIFFERENTIAL/PLATELET
Basophils Absolute: 0 10*3/uL (ref 0.0–0.1)
Basophils Relative: 0.7 % (ref 0.0–3.0)
Eosinophils Absolute: 0.1 10*3/uL (ref 0.0–0.7)
Eosinophils Relative: 2.6 % (ref 0.0–5.0)
HCT: 40.9 % (ref 36.0–46.0)
Hemoglobin: 13.8 g/dL (ref 12.0–15.0)
Lymphocytes Relative: 27.6 % (ref 12.0–46.0)
Lymphs Abs: 1.5 10*3/uL (ref 0.7–4.0)
MCHC: 33.8 g/dL (ref 30.0–36.0)
MCV: 92.4 fl (ref 78.0–100.0)
Monocytes Absolute: 0.5 10*3/uL (ref 0.1–1.0)
Monocytes Relative: 8.9 % (ref 3.0–12.0)
Neutro Abs: 3.2 10*3/uL (ref 1.4–7.7)
Neutrophils Relative %: 60.2 % (ref 43.0–77.0)
Platelets: 274 10*3/uL (ref 150.0–400.0)
RBC: 4.42 Mil/uL (ref 3.87–5.11)
RDW: 13.4 % (ref 11.5–15.5)
WBC: 5.2 10*3/uL (ref 4.0–10.5)

## 2022-04-06 LAB — LIPID PANEL
Cholesterol: 179 mg/dL (ref 0–200)
HDL: 73.5 mg/dL (ref >39.00)
LDL Cholesterol: 90 mg/dL (ref 0–99)
NonHDL: 105.34
Total CHOL/HDL Ratio: 2
Triglycerides: 77 mg/dL (ref 0.0–149.0)
VLDL: 15.4 mg/dL (ref 0.0–40.0)

## 2022-04-06 LAB — TSH: TSH: 0.26 u[IU]/mL — ABNORMAL LOW (ref 0.35–5.50)

## 2022-04-06 LAB — VITAMIN D 25 HYDROXY (VIT D DEFICIENCY, FRACTURES): VITD: 64.98 ng/mL (ref 30.00–100.00)

## 2022-04-06 NOTE — Assessment & Plan Note (Signed)
Pt's PE unchanged from previous.  UTD on mammo, colonoscopy, immunizations.  Check labs.  Anticipatory guidance provided.

## 2022-04-06 NOTE — Progress Notes (Signed)
Subjective:    Patient ID: Tracy [name], female    DOB: 1950-07-26, 72 y.o.   MRN: [id number]  HPI CPE- UTD on Tdap, PNA, colonoscopy, mammo.  Pt reports feeling well.  Patient Care Team    Relationship Specialty Notifications Start End  [name], MD PCP - General Family Medicine  09/15/21   Lorretta Harp, MD PCP - Cardiology Cardiology  10/29/18   [name], MD Consulting Physician Internal Medicine  03/13/15   Festus Aloe, MD Consulting Physician Urology  03/13/15   Gaynelle Arabian, MD Consulting Physician Orthopedic Surgery  05/19/16   Harriett Sine, MD Consulting Physician Dermatology  05/19/16   Mauri Pole, MD Consulting Physician Gastroenterology  09/07/16   Hortencia Pilar, MD Consulting Physician Surgery  11/28/17    Comment: Opthalmology   Netta Cedars, MD Consulting Physician Orthopedic Surgery  11/28/17   Wylene Simmer, MD Consulting Physician Orthopedic Surgery  11/28/17    Comment: foot  Madelin Rear, [facility] (Inactive) Pharmacist Pharmacist  11/13/19    Comment: [phone]    Health Maintenance  Topic Date Due   Medicare Annual Wellness (AWV)  06/25/2022   DTaP/Tdap/Td (2 - Td or Tdap) 06/29/2022   MAMMOGRAM  10/30/2022   COLONOSCOPY (Pts 45-49yr Insurance coverage will need to be confirmed)  01/06/2027   Pneumonia Vaccine 72 Years old  Completed   INFLUENZA VACCINE  Completed   DEXA SCAN  Completed   Hepatitis C Screening  Completed   HPV VACCINES  Aged Out   COVID-19 Vaccine  Discontinued   Zoster Vaccines- Shingrix  Discontinued      Review of Systems Patient reports no vision/ hearing changes, adenopathy,fever, weight change,  persistant/recurrent hoarseness , swallowing issues, chest pain, palpitations, edema, persistant/recurrent cough, hemoptysis, dyspnea (rest/exertional/paroxysmal nocturnal), gastrointestinal bleeding (melena, rectal bleeding), abdominal pain, significant heartburn, bowel changes, GU symptoms  (dysuria, hematuria, incontinence), Gyn symptoms (abnormal  bleeding, pain),  syncope, focal weakness, memory loss, skin/hair/nail changes, abnormal bruising or bleeding, anxiety, or depression.   + numbness of feet bilaterally    Objective:   Physical Exam General Appearance:    Alert, cooperative, no distress, appears stated age  Head:    Normocephalic, without obvious abnormality, atraumatic  Eyes:    PERRL, conjunctiva/corneas clear, EOM's intact both eyes  Ears:    Normal TM's and external ear canals, both ears  Nose:   Nares normal, septum midline, mucosa normal, no drainage    or sinus tenderness  Throat:   Lips, mucosa, and tongue normal; teeth and gums normal  Neck:   Supple, symmetrical, trachea midline, no adenopathy;    Thyroid: no enlargement/tenderness/nodules  Back:     Symmetric, no curvature, ROM normal, no CVA tenderness  Lungs:     Clear to auscultation bilaterally, respirations unlabored  Chest Wall:    No tenderness or deformity   Heart:    Regular rate and rhythm, S1 and S2 normal, no murmur, rub   or gallop  Breast Exam:    Deferred to mammo  Abdomen:     Soft, non-tender, bowel sounds active all four quadrants,    no masses, no organomegaly  Genitalia:    Deferred  Rectal:    Extremities:   Extremities normal, atraumatic, no cyanosis or edema  Pulses:   2+ and symmetric all extremities  Skin:   Skin color, texture, turgor normal, no rashes or lesions  Lymph nodes:   Cervical, supraclavicular, and axillary nodes normal  Neurologic:  CNII-XII intact, normal strength and reflexes    throughout          Assessment & Plan:

## 2022-04-06 NOTE — Assessment & Plan Note (Signed)
Check Vit D level and replete prn.

## 2022-04-06 NOTE — Assessment & Plan Note (Signed)
Ongoing issue for pt.  Weight is stable and BMI is 27.85  Encouraged healthy diet and regular exercise.  Check labs to risk stratify.  Will follow.

## 2022-04-06 NOTE — Patient Instructions (Signed)
Follow up in 1 year or as needed We'll notify you of your lab results and make any changes if needed Keep up the good work on healthy diet and regular exercise- you look great!!! Call with any questions or concerns Stay Safe!  Stay Healthy! Happy [birth year] [employer]!!!

## 2022-04-07 ENCOUNTER — Telehealth: Payer: Self-pay

## 2022-04-07 NOTE — Telephone Encounter (Signed)
-----   Message from [name], MD sent at 04/07/2022  7:40 AM EST ----- Labs are stable and look great!  No changes at this time

## 2022-04-07 NOTE — Telephone Encounter (Signed)
Informed pt of lab results  

## 2022-04-08 NOTE — Telephone Encounter (Signed)
error 

## 2022-04-11 ENCOUNTER — Ambulatory Visit: Payer: Medicare Other

## 2022-04-11 DIAGNOSIS — M810 Age-related osteoporosis without current pathological fracture: Secondary | ICD-10-CM

## 2022-04-11 MED ORDER — DENOSUMAB 60 MG/ML ~~LOC~~ SOSY
60.0000 mg | PREFILLED_SYRINGE | Freq: Once | SUBCUTANEOUS | Status: AC
  Administered 2022-04-11: 60 mg via SUBCUTANEOUS

## 2022-05-05 ENCOUNTER — Ambulatory Visit (HOSPITAL_BASED_OUTPATIENT_CLINIC_OR_DEPARTMENT_OTHER): Payer: Medicare Other | Admitting: Pulmonary Disease

## 2022-05-05 ENCOUNTER — Encounter (HOSPITAL_BASED_OUTPATIENT_CLINIC_OR_DEPARTMENT_OTHER): Payer: Self-pay | Admitting: Pulmonary Disease

## 2022-05-05 VITALS — BP 130/70 | HR 67 | Temp 98.1°F | Ht 62.0 in | Wt 154.0 lb

## 2022-05-05 DIAGNOSIS — G4733 Obstructive sleep apnea (adult) (pediatric): Secondary | ICD-10-CM

## 2022-05-05 NOTE — Progress Notes (Signed)
[name]  Chief Complaint  Patient presents with   Follow-up    Pt states she has been doing good since last visit and denies any complaints. States CPAP is working well for her.    Constitutional:  BP 130/70 (BP Location: Left Arm, Patient Position: Sitting, Cuff Size: Normal)   Pulse 67   Temp 98.1 F (36.7 C) (Oral)   Ht '5\' 2"'$  (1.575 m)   Wt 154 lb (69.9 kg)   SpO2 96% Comment: RA  BMI 28.17 kg/m   Past Medical History:  Allergies, Anxiety, OA, C diff, Cataract, Diverticulitis, GERD, HH, HLD, Hypothyroidism, Osteopenia, Osteoporosis, Rosacea  Past Surgical History:  She  has a past surgical history that includes Knee surgery; Back surgery; Laparoscopic liver cyst fenestration; Foot surgery (Right); Cholecystectomy; Tonsillectomy; Appendectomy; Tubal ligation; Open surgical repair of gluteal tendon (Left, 04/01/2014); Total knee arthroplasty (Right, 08/17/2015); Reverse shoulder arthroplasty (Right, 06/23/2017); and Cataract extraction, bilateral (11/2018).  Brief Summary:  Tracy [name] is a 72 y.o. female with obstructive sleep apnea.      Subjective:   Doing well with CPAP.  No issues with mask fit.  Takes melatonin at 1030 pm.  Falls asleep by 11 pm.  Gets out of bed between 7 and 8 am.  Feels rested.  Gets a bit of a lull in mid-afternoon, but doesn't nap.  Physical Exam:   Appearance - well kempt   ENMT - no sinus tenderness, no oral exudate, no LAN, Mallampati 2 airway, no stridor, decreased AP diameter  Respiratory - equal breath sounds bilaterally, no wheezing or rales  CV - s1s2 regular rate and rhythm, no murmurs  Ext - no clubbing, no edema  Skin - no rashes  Psych - normal mood and affect    Sleep Tests:  HST 06/17/20 >> AHI 64.6, SpO2 low 65% Auto CPAP 04/04/22 to 05/03/22 >> used on 30 of 30 nights with average 7 hrs 25 min.  Average AHI 1.5 with median CPAP 6 and 95 th percentile CPAP 7 cm  H2O  Cardiac Tests:  Echo 10/17/18 >> EF 60 to 65%  Social History:  She  reports that she has never smoked. She has never used smokeless tobacco. She reports that she does not drink alcohol and does not use drugs.  Family History:  Her family history includes Bone cancer in her paternal grandfather; Hypertension in her mother; Liver disease in her father; Stroke in her mother.     Assessment/Plan:   Obstructive sleep apnea. - she is compliant with CPAP and reports benefit from therapy - she uses [employer] for her DME - current CPAP ordered April 2022 - continue auto CPAP 5 to 8 cm H2O  Time Spent Involved in Patient Care on Day of Examination:  15 minutes  Follow up:   Patient Instructions  Follow up in 1 year  Medication List:   Allergies as of 05/05/2022       Reactions   Amoxicillin-pot Clavulanate Diarrhea   Severe diarrhea Severe diarrhea Severe diarrhea   Nylon Hives   Demerol [meperidine] Nausea And Vomiting   Latex Rash, Other (See Comments), Hives   Reaction:  Blisters    Other Itching   Nylon sheets, pt prefers cotton sheets    Tramadol Nausea And Vomiting        Medication List        Accurate as of May 05, 2022  3:18 PM. If you have any questions, ask  your nurse or doctor.          STOP taking these medications    nystatin cream Commonly known as: MYCOSTATIN Stopped by: Chesley Mires, MD       TAKE these medications    CALCIUM 600+D PO Take 1 tablet by mouth 2 (two) times daily.   fenofibrate 160 MG tablet TAKE 1 TABLET BY MOUTH DAILY   fluticasone 50 MCG/ACT nasal spray Commonly known as: FLONASE USE 1 SPRAY IN BOTH NOSTRILS  DAILY   melatonin 5 MG Tabs Take 5 mg by mouth at bedtime.   multivitamin with minerals Tabs tablet Take 1 tablet by mouth daily.   pantoprazole 40 MG tablet Commonly known as: PROTONIX TAKE 1 TABLET BY MOUTH TWICE  DAILY   polyethylene glycol 17 g packet Commonly known as: MIRALAX / GLYCOLAX Take  17 g by mouth daily.   Vitamin D 50 MCG (2000 UT) tablet Take 2,000 Units by mouth daily.        Signature:  Chesley Mires, MD [place] Pager - [phone] 05/05/2022, 3:18 PM

## 2022-05-05 NOTE — Patient Instructions (Signed)
Follow up in 1 year.

## 2022-05-11 DIAGNOSIS — M1712 Unilateral primary osteoarthritis, left knee: Secondary | ICD-10-CM | POA: Diagnosis not present

## 2022-05-12 DIAGNOSIS — G4733 Obstructive sleep apnea (adult) (pediatric): Secondary | ICD-10-CM | POA: Diagnosis not present

## 2022-05-24 DIAGNOSIS — H04123 Dry eye syndrome of bilateral lacrimal glands: Secondary | ICD-10-CM | POA: Diagnosis not present

## 2022-05-24 DIAGNOSIS — H26493 Other secondary cataract, bilateral: Secondary | ICD-10-CM | POA: Diagnosis not present

## 2022-05-24 DIAGNOSIS — Z961 Presence of intraocular lens: Secondary | ICD-10-CM | POA: Diagnosis not present

## 2022-05-24 DIAGNOSIS — H02831 Dermatochalasis of right upper eyelid: Secondary | ICD-10-CM | POA: Diagnosis not present

## 2022-05-24 DIAGNOSIS — H524 Presbyopia: Secondary | ICD-10-CM | POA: Diagnosis not present

## 2022-05-24 LAB — HM DIABETES EYE EXAM

## 2022-05-25 ENCOUNTER — Encounter: Payer: Self-pay | Admitting: Family Medicine

## 2022-05-27 ENCOUNTER — Other Ambulatory Visit: Payer: Self-pay | Admitting: Family Medicine

## 2022-05-30 ENCOUNTER — Telehealth: Payer: Self-pay

## 2022-06-15 ENCOUNTER — Encounter: Payer: Self-pay | Admitting: Family Medicine

## 2022-06-15 ENCOUNTER — Ambulatory Visit (INDEPENDENT_AMBULATORY_CARE_PROVIDER_SITE_OTHER): Payer: Medicare Other | Admitting: Family Medicine

## 2022-06-15 VITALS — BP 110/70 | HR 65 | Temp 97.8°F | Resp 16 | Ht 62.0 in | Wt 153.0 lb

## 2022-06-15 DIAGNOSIS — R413 Other amnesia: Secondary | ICD-10-CM | POA: Diagnosis not present

## 2022-06-15 DIAGNOSIS — R5383 Other fatigue: Secondary | ICD-10-CM | POA: Diagnosis not present

## 2022-06-15 DIAGNOSIS — F418 Other specified anxiety disorders: Secondary | ICD-10-CM | POA: Diagnosis not present

## 2022-06-15 LAB — CBC WITH DIFFERENTIAL/PLATELET
Basophils Absolute: 0 10*3/uL (ref 0.0–0.1)
Basophils Relative: 0.5 % (ref 0.0–3.0)
Eosinophils Absolute: 0.2 10*3/uL (ref 0.0–0.7)
Eosinophils Relative: 2.1 % (ref 0.0–5.0)
HCT: 39.1 % (ref 36.0–46.0)
Hemoglobin: 13.2 g/dL (ref 12.0–15.0)
Lymphocytes Relative: 20.5 % (ref 12.0–46.0)
Lymphs Abs: 1.5 10*3/uL (ref 0.7–4.0)
MCHC: 33.8 g/dL (ref 30.0–36.0)
MCV: 91.8 fl (ref 78.0–100.0)
Monocytes Absolute: 0.7 10*3/uL (ref 0.1–1.0)
Monocytes Relative: 9.5 % (ref 3.0–12.0)
Neutro Abs: 4.8 10*3/uL (ref 1.4–7.7)
Neutrophils Relative %: 67.4 % (ref 43.0–77.0)
Platelets: 239 10*3/uL (ref 150.0–400.0)
RBC: 4.26 Mil/uL (ref 3.87–5.11)
RDW: 13.1 % (ref 11.5–15.5)
WBC: 7.1 10*3/uL (ref 4.0–10.5)

## 2022-06-15 LAB — BASIC METABOLIC PANEL
BUN: 27 mg/dL — ABNORMAL HIGH (ref 6–23)
CO2: 27 mEq/L (ref 19–32)
Calcium: 10.1 mg/dL (ref 8.4–10.5)
Chloride: 106 mEq/L (ref 96–112)
Creatinine, Ser: 1.1 mg/dL (ref 0.40–1.20)
GFR: 49.61 mL/min — ABNORMAL LOW (ref >60.00)
Glucose, Bld: 93 mg/dL (ref 70–99)
Potassium: 4.5 mEq/L (ref 3.5–5.1)
Sodium: 143 mEq/L (ref 135–145)

## 2022-06-15 LAB — HEPATIC FUNCTION PANEL
ALT: 15 U/L (ref 0–35)
AST: 29 U/L (ref 0–37)
Albumin: 4.2 g/dL (ref 3.5–5.2)
Alkaline Phosphatase: 30 U/L — ABNORMAL LOW (ref 39–117)
Bilirubin, Direct: 0.1 mg/dL (ref 0.0–0.3)
Total Bilirubin: 0.4 mg/dL (ref 0.2–1.2)
Total Protein: 7 g/dL (ref 6.0–8.3)

## 2022-06-15 LAB — VITAMIN D 25 HYDROXY (VIT D DEFICIENCY, FRACTURES): VITD: 54.09 ng/mL (ref 30.00–100.00)

## 2022-06-15 LAB — T3, FREE: T3, Free: 2.9 pg/mL (ref 2.3–4.2)

## 2022-06-15 LAB — T4, FREE: Free T4: 1.23 ng/dL (ref 0.60–1.60)

## 2022-06-15 LAB — TSH: TSH: 0.16 u[IU]/mL — ABNORMAL LOW (ref 0.35–5.50)

## 2022-06-15 LAB — B12 AND FOLATE PANEL
Folate: >23.5 ng/mL (ref >5.9)
Vitamin B-12: 564 pg/mL (ref 211–911)

## 2022-06-15 MED ORDER — DULOXETINE HCL 20 MG PO CPEP: 20.0000 mg | ORAL_CAPSULE | Freq: Every day | ORAL | 3 refills | Status: DC

## 2022-06-15 NOTE — Patient Instructions (Addendum)
Follow up in 4-6 weeks to recheck anxiety/mood We'll notify you of your lab results and make any changes if needed START the Cymbalta (Duloxetine)  daily Try and be more present in the moment and less in your head.  If you're able to stay in the moment, you will be better able to retain the information Allow yourself time to clear your head and just be Call with any questions or concerns Hang in there!  You've got this!!

## 2022-06-15 NOTE — Progress Notes (Signed)
   Subjective:    Patient ID: Tracy [name], female    DOB: 1950-03-02, 72 y.o.   MRN: [id number]  HPI Fatigue- 'i have a tired brain.  It's just tired all the time'.  Pt walks daily, reads the newspaper, does puzzles/crosswords.  + HA's for the last month that pt feels is pollen related.  Some forgetfulness.  Husband will say something and that makes pt forget what she was going to say.  No difficulties using machines/equipment/appliances.  Pt reports increased worry.  Was previously on medication for anxiety.  Not sleeping well due to racing thoughts   Review of Systems For ROS see HPI     Objective:   Physical Exam Vitals reviewed.  Constitutional:      General: She is not in acute distress.    Appearance: Normal appearance. She is not ill-appearing.  HENT:     Head: Normocephalic and atraumatic.  Eyes:     Extraocular Movements: Extraocular movements intact.     Conjunctiva/sclera: Conjunctivae normal.     Pupils: Pupils are equal, round, and reactive to light.  Cardiovascular:     Rate and Rhythm: Normal rate and regular rhythm.  Pulmonary:     Effort: Pulmonary effort is normal. No respiratory distress.     Breath sounds: Normal breath sounds. No wheezing or rhonchi.  Skin:    General: Skin is warm and dry.  Neurological:     General: No focal deficit present.     Mental Status: She is alert and oriented to person, place, and time.  Psychiatric:        Behavior: Behavior normal.     Comments: anxious           Assessment & Plan:  Fatigue- new.  Pt reports her 'brain is tired'.  She is having a difficult time explaining that but admits anxiety has been high and she hasn't been sleeping well.  I suspect her fatigue is anxiety related but will check labs for underlying metabolic cause.  Pt expressed understanding and is in agreement w/ plan.   Memory changes- new.  Husband doesn't feel that she is having issues w/ memory and pt is able to remember medications and  dosages that she took years ago.  Again, I think her memory issues are related to her anxiety and are not true memory issues but rather she is not focused and retaining the information in the first place.  Encouraged her to try and be more present and focus on what is being said to her rather than being 3 steps ahead in her head.  Will follow.

## 2022-06-16 ENCOUNTER — Telehealth: Payer: Self-pay

## 2022-06-16 NOTE — Telephone Encounter (Signed)
-----   Message from Sheliah Hatch, MD sent at 06/16/2022  7:25 AM EDT ----- Labs are stable and look good!  Your TSH remains low but your T3 and T4 are in normal ranges- meaning the thyroid function is normal.

## 2022-06-16 NOTE — Telephone Encounter (Signed)
Pt seen results Via my chart  

## 2022-06-18 NOTE — Assessment & Plan Note (Signed)
Deteriorated.  Pt has been on meds before and admits that her anxiety is worse.  Will start low dose Cymbalta  daily and monitor closely for improvement.  Pt expressed understanding and is in agreement w/ plan.

## 2022-06-21 ENCOUNTER — Telehealth: Payer: Self-pay | Admitting: Family Medicine

## 2022-06-21 NOTE — Telephone Encounter (Signed)
Contacted Tracy [name] to schedule their annual wellness visit. Appointment made for 06/29/2022.  Thank you,  [facility] Support [facility] Direct dial  [phone]

## 2022-06-21 NOTE — Telephone Encounter (Signed)
Opened in error

## 2022-06-29 ENCOUNTER — Ambulatory Visit (INDEPENDENT_AMBULATORY_CARE_PROVIDER_SITE_OTHER): Payer: Medicare Other | Admitting: *Deleted

## 2022-06-29 DIAGNOSIS — Z0000 Encounter for general adult medical examination without abnormal findings: Secondary | ICD-10-CM | POA: Diagnosis not present

## 2022-06-29 NOTE — Progress Notes (Signed)
Subjective:   Tracy [name] is a 72 y.o. female who presents for Medicare Annual (Subsequent) preventive examination.  I connected with  Tracy [name] on 06/29/22 by a telephone enabled telemedicine application and verified that I am speaking with the correct person using two identifiers.   I discussed the limitations of evaluation and management by telemedicine. The patient expressed understanding and agreed to proceed.  Patient location: home  Provider location: telephone/home  .   Review of Systems     Cardiac Risk Factors include: advanced age (>63men, >25 women)     Objective:    There were no vitals filed for this visit. There is no height or weight on file to calculate BMI.     06/29/2022    2:03 PM 06/24/2021    4:04 PM 07/03/2020    9:00 PM 03/09/2020    9:04 AM 07/12/2019   12:43 PM 11/28/2017   10:30 AM 06/23/2017    5:35 PM  Advanced Directives  Does Patient Have a Medical Advance Directive? Yes Yes No Yes No Yes Yes  Type of Advance Directive Living will;Healthcare Power of [employer] Power of [place];Living will  Healthcare Power of [place];Living will  Healthcare Power of [place];Living will Living will  Does patient want to make changes to medical advance directive?       No - Patient declined  Copy of Healthcare Power of Attorney in Chart? Yes - validated most recent copy scanned in chart (See row information) No - copy requested  Yes - validated most recent copy scanned in chart (See row information)  Yes   Would patient like information on creating a medical advance directive?   No - Patient declined  No - Patient declined      Current Medications (verified) Outpatient Encounter Medications as of 06/29/2022  Medication Sig   Calcium Carbonate-Vitamin D (CALCIUM 600+D PO) Take 1 tablet by mouth 2 (two) times daily.   Cholecalciferol (VITAMIN D) 2000 units tablet Take 2,000 Units by mouth daily.   DULoxetine (CYMBALTA) 20 MG capsule Take 1  capsule (20 mg total) by mouth daily.   fenofibrate 160 MG tablet TAKE 1 TABLET BY MOUTH DAILY   fluticasone (FLONASE) 50 MCG/ACT nasal spray USE 1 SPRAY IN BOTH NOSTRILS  DAILY   Melatonin 5 MG TABS Take 5 mg by mouth at bedtime.   Multiple Vitamin (MULTIVITAMIN WITH MINERALS) TABS tablet Take 1 tablet by mouth daily.   pantoprazole (PROTONIX) 40 MG tablet TAKE 1 TABLET BY MOUTH TWICE  DAILY   polyethylene glycol (MIRALAX / GLYCOLAX) packet Take 17 g by mouth daily.   No facility-administered encounter medications on file as of 06/29/2022.    Allergies (verified) Amoxicillin-pot clavulanate, Nylon, Demerol [meperidine], Latex, Other, and Tramadol   History: Past Medical History:  Diagnosis Date   Allergy    Anxiety    Arthritis    Bursitis of left hip    C. difficile diarrhea    Cancer (HCC)    basil cell   Cataract    bilateral   Complication of anesthesia    Diverticulitis    GERD (gastroesophageal reflux disease)    Hx: UTI (urinary tract infection)    Hyperlipidemia    Hyperthyroidism    Osteopenia    Osteoporosis    PONV (postoperative nausea and vomiting)    Rosacea    Rotator cuff arthropathy, left    Past Surgical History:  Procedure Laterality Date   APPENDECTOMY  BACK SURGERY     CATARACT EXTRACTION, BILATERAL  11/2018   CHOLECYSTECTOMY     FOOT SURGERY Right    X 2   KNEE SURGERY     LAPAROSCOPIC LIVER CYST FENESTRATION     OPEN SURGICAL REPAIR OF GLUTEAL TENDON Left 04/01/2014   Procedure: LEFT HIP BURSECTOMY WITH GLUTEAL TENDON REPAIR;  Surgeon: Loanne Drilling, MD;  Location: [facility];  Service: Orthopedics;  Laterality: Left;   REVERSE SHOULDER ARTHROPLASTY Right 06/23/2017   Procedure: RIGHT REVERSE SHOULDER ARTHROPLASTY;  Surgeon: Beverely Low, MD;  Location: [facility] OR;  Service: Orthopedics;  Laterality: Right;   TONSILLECTOMY     TOTAL KNEE ARTHROPLASTY Right 08/17/2015   Procedure: RIGHT TOTAL KNEE ARTHROPLASTY;  Surgeon: Ollen Gross, MD;   Location: [facility];  Service: Orthopedics;  Laterality: Right;   TUBAL LIGATION     Family History  Problem Relation Age of Onset   Hypertension Mother    Stroke Mother    Liver disease Father        alcohol related   Bone cancer Paternal Grandfather    Cancer Neg Hx    Diabetes Neg Hx    Colon cancer Neg Hx    Esophageal cancer Neg Hx    Pancreatic cancer Neg Hx    Stomach cancer Neg Hx    Social History   Socioeconomic History   Marital status: Married    Spouse name: Not on file   Number of children: Not on file   Years of education: Not on file   Highest education level: Associate degree: academic program  Occupational History   Not on file  Tobacco Use   Smoking status: Never   Smokeless tobacco: Never  Vaping Use   Vaping Use: Never used  Substance and Sexual Activity   Alcohol use: No   Drug use: No   Sexual activity: Not Currently  Other Topics Concern   Not on file  Social History Narrative   Not on file   Social Determinants of Health   Financial Resource Strain: Low Risk  (06/29/2022)   Overall Financial Resource Strain (CARDIA)    Difficulty of Paying Living Expenses: Not hard at all  Food Insecurity: No Food Insecurity (06/29/2022)   Hunger Vital Sign    Worried About Running Out of Food in the Last Year: Never true    Ran Out of Food in the Last Year: Never true  Transportation Needs: No Transportation Needs (06/29/2022)   PRAPARE - Administrator, Civil Service (Medical): No    Lack of Transportation (Non-Medical): No  Physical Activity: Sufficiently Active (06/29/2022)   Exercise Vital Sign    Days of Exercise per Week: 7 days    Minutes of Exercise per Session: 30 min  Stress: Stress Concern Present (06/29/2022)   [employer] of Occupational Health - Occupational Stress Questionnaire    Feeling of Stress : To some extent  Social Connections: Socially Integrated (06/29/2022)   Social Connection and Isolation Panel [NHANES]    Frequency  of Communication with Friends and Family: More than three times a week    Frequency of Social Gatherings with Friends and Family: More than three times a week    Attends Religious Services: More than 4 times per year    Active Member of [employer] or Organizations: Yes    Attends Engineer, structural: More than 4 times per year    Marital Status: Married    Tobacco Counseling Counseling given: Not  Answered   Clinical Intake:  Pre-visit preparation completed: Yes  Pain : No/denies pain     Nutritional Risks: None Diabetes: No  How often do you need to have someone help you when you read instructions, pamphlets, or other written materials from your doctor or pharmacy?: 1 - Never  Diabetic?  no  Interpreter Needed?: No  Information entered by :: Remi Haggard LPN   Activities of Daily Living    06/29/2022    2:17 PM 06/26/2022    1:03 PM  In your present state of health, do you have any difficulty performing the following activities:  Hearing?  0  Vision? 0 0  Difficulty concentrating or making decisions? 1 1  Walking or climbing stairs? 0 0  Dressing or bathing? 0 0  Doing errands, shopping? 0 0  Preparing Food and eating ? N N  Using the Toilet? N N  In the past six months, have you accidently leaked urine? N N  Do you have problems with loss of bowel control? N N  Managing your Medications? N N  Managing your Finances? N N  Housekeeping or managing your Housekeeping? N N    Patient Care Team: Sheliah Hatch, MD as PCP - General (Family Medicine) Runell Gess, MD as PCP - Cardiology (Cardiology) Carlus Pavlov, MD as Consulting Physician (Internal Medicine) Jerilee Field, MD as Consulting Physician (Urology) Ollen Gross, MD as Consulting Physician (Orthopedic Surgery) Aris Lot, MD as Consulting Physician (Dermatology) Napoleon Form, MD as Consulting Physician (Gastroenterology) Dimitri Ped, MD as Consulting  Physician (Surgery) Beverely Low, MD as Consulting Physician (Orthopedic Surgery) Toni Arthurs, MD as Consulting Physician (Orthopedic Surgery) Dahlia Byes, [facility] (Inactive) as Pharmacist (Pharmacist)  Indicate any recent Medical Services you may have received from other than [facility] providers in the past year (date may be approximate).     Assessment:   This is a routine wellness examination for [facility].  Hearing/Vision screen Hearing Screening - Comments:: No trouble hearing Vision Screening - Comments:: [facility] Up to date  Dietary issues and exercise activities discussed: Current Exercise Habits: Home exercise routine, Type of exercise: walking, Time (Minutes): 30, Frequency (Times/Week): 7, Weekly Exercise (Minutes/Week): 210, Intensity: Mild   Goals Addressed             This Visit's Progress    Increase physical activity   On track    Increase activity by joining gym/water aerobics.      Patient Stated   On track    Continue eating healthy & drinking lots of water     Patient Stated       Continue current lifestyle        Depression Screen    06/29/2022    2:07 PM 06/15/2022   12:50 PM 04/06/2022   10:57 AM 02/04/2022   10:18 AM 12/29/2021   10:19 AM 11/17/2021   10:25 AM 06/30/2021    9:13 AM  PHQ 2/9 Scores  PHQ - 2 Score 0 3 2 0 3 2 3   PHQ- 9 Score 2 6 4  0 7 5 4     Fall Risk    06/29/2022    2:02 PM 06/26/2022    1:03 PM 06/15/2022   12:50 PM 04/06/2022   10:58 AM 02/04/2022   10:18 AM  Fall Risk   Falls in the past year? 0 0 0 0 0  Number falls in past yr: 0  0 0   Injury with Fall? 0 0 0 0  Risk for fall due to :   No Fall Risks No Fall Risks No Fall Risks  Follow up Falls evaluation completed;Education provided;Falls prevention discussed  Falls evaluation completed Falls evaluation completed Falls evaluation completed    FALL RISK PREVENTION PERTAINING TO THE HOME:  Any stairs in or around the home? Yes  If so, are there any without handrails? No   Home free of loose throw rugs in walkways, pet beds, electrical cords, etc? Yes  Adequate lighting in your home to reduce risk of falls? Yes   ASSISTIVE DEVICES UTILIZED TO PREVENT FALLS:  Life alert? No  Use of a cane, walker or w/c? No  Grab bars in the bathroom? No  Shower chair or bench in shower? No  Elevated toilet seat or a handicapped toilet? No   TIMED UP AND GO:  Was the test performed? No .    Cognitive Function:    11/28/2017   10:35 AM  MMSE - Mini Mental State Exam  Orientation to time 5  Orientation to Place 5  Registration 3  Attention/ Calculation 5  Recall 3  Language- name 2 objects 2  Language- repeat 1  Language- follow 3 step command 3  Language- read & follow direction 1  Write a sentence 1  Copy design 1  Total score 30        06/29/2022    2:05 PM 03/09/2020    9:18 AM  6CIT Screen  What Year? 0 points 0 points  What month? 0 points 0 points  What time? 0 points 0 points  Count back from 20 0 points 0 points  Months in reverse 0 points 0 points  Repeat phrase 0 points 0 points  Total Score 0 points 0 points    Immunizations Immunization History  Administered Date(s) Administered   Influenza Whole 11/28/2012   Influenza, High Dose Seasonal PF 11/10/2014, 10/26/2015, 11/09/2016, 10/31/2018, 11/20/2019, 11/03/2020   Influenza,inj,Quad PF,6+ Mos 12/09/2013, 10/30/2017   Influenza-Unspecified 10/31/2018, 11/10/2021   PFIZER(Purple Top)SARS-COV-2 Vaccination 03/22/2019, 04/12/2019, 12/04/2019, 06/22/2020, 11/10/2020   Pfizer Covid Bivalent Pediatric Vaccine(49mos to <13yrs) 06/30/2021   Pneumococcal Conjugate-13 09/08/2014   Pneumococcal Polysaccharide-23 07/16/2013   Tdap 06/28/2012    TDAP status: Due, Education has been provided regarding the importance of this vaccine. Advised may receive this vaccine at local pharmacy or Health Dept. Aware to provide a copy of the vaccination record if obtained from local pharmacy or Health Dept.  Verbalized acceptance and understanding.  Flu Vaccine status: Up to date  Pneumococcal vaccine status: Up to date  Covid-19 vaccine status: Information provided on how to obtain vaccines.   Qualifies for Shingles Vaccine? No   Zostavax completed Yes   Shingrix Completed?: Yes  Screening Tests Health Maintenance  Topic Date Due   DTaP/Tdap/Td (2 - Td or Tdap) 06/29/2022   INFLUENZA VACCINE  09/29/2022   MAMMOGRAM  10/30/2022   Medicare Annual Wellness (AWV)  06/29/2023   COLONOSCOPY (Pts 45-78yrs Insurance coverage will need to be confirmed)  01/06/2027   Pneumonia Vaccine 68+ Years old  Completed   DEXA SCAN  Completed   Hepatitis C Screening  Completed   HPV VACCINES  Aged Out   COVID-19 Vaccine  Discontinued   Zoster Vaccines- Shingrix  Discontinued    Health Maintenance  Health Maintenance Due  Topic Date Due   DTaP/Tdap/Td (2 - Td or Tdap) 06/29/2022    Colorectal cancer screening: No longer required.   Mammogram status: Completed  . Repeat every year  Bone Density status: Completed  . Results reflect: Bone density results: OSTEOPOROSIS. Repeat every 0 years.  Lung Cancer Screening: (Low Dose CT Chest recommended if Age 38-80 years, 30 pack-year currently smoking OR have quit w/in 15years.) does not qualify.   Lung Cancer Screening Referral:   Additional Screening:  Hepatitis C Screening: does not qualify; Completed 2017  Vision Screening: Recommended annual ophthalmology exams for early detection of glaucoma and other disorders of the eye. Is the patient up to date with their annual eye exam?  Yes  Who is the provider or what is the name of the office in which the patient attends annual eye exams? [facility] If pt is not established with a provider, would they like to be referred to a provider to establish care? No .   Dental Screening: Recommended annual dental exams for proper oral hygiene  Community Resource Referral / Chronic Care Management: CRR required  this visit?  No   CCM required this visit?  No      Plan:     I have personally reviewed and noted the following in the patient's chart:   Medical and social history Use of alcohol, tobacco or illicit drugs  Current medications and supplements including opioid prescriptions. Patient is not currently taking opioid prescriptions. Functional ability and status Nutritional status Physical activity Advanced directives List of other physicians Hospitalizations, surgeries, and ER visits in previous 12 months Vitals Screenings to include cognitive, depression, and falls Referrals and appointments  In addition, I have reviewed and discussed with patient certain preventive protocols, quality metrics, and best practice recommendations. A written personalized care plan for preventive services as well as general preventive health recommendations were provided to patient.     Remi Haggard, LPN   4/0/9811   Nurse Notes:

## 2022-06-29 NOTE — Patient Instructions (Signed)
Tracy [name] , Thank you for taking time to come for your Medicare Wellness Visit. I appreciate your ongoing commitment to your health goals. Please review the following plan we discussed and let me know if I can assist you in the future.   Screening recommendations/referrals: Colonoscopy: no longer required Mammogram: up to date Bone Density: up to date Recommended yearly ophthalmology/optometry visit for glaucoma screening and checkup Recommended yearly dental visit for hygiene and checkup  Vaccinations: Influenza vaccine: up to date Pneumococcal vaccine: up to date Tdap vaccine: Education provided Shingles vaccine: up to date    Advanced directives: on file    Preventive Care 74 Years and Older, Female Preventive care refers to lifestyle choices and visits with your health care provider that can promote health and wellness. What does preventive care include? A yearly physical exam. This is also called an annual well check. Dental exams once or twice a year. Routine eye exams. Ask your health care provider how often you should have your eyes checked. Personal lifestyle choices, including: Daily care of your teeth and gums. Regular physical activity. Eating a healthy diet. Avoiding tobacco and drug use. Limiting alcohol use. Practicing safe sex. Taking low-dose aspirin every day. Taking vitamin and mineral supplements as recommended by your health care provider. What happens during an annual well check? The services and screenings done by your health care provider during your annual well check will depend on your age, overall health, lifestyle risk factors, and family history of disease. Counseling  Your health care provider may ask you questions about your: Alcohol use. Tobacco use. Drug use. Emotional well-being. Home and relationship well-being. Sexual activity. Eating habits. History of falls. Memory and ability to understand (cognition). Work and work  Astronomer. Reproductive health. Screening  You may have the following tests or measurements: Height, weight, and BMI. Blood pressure. Lipid and cholesterol levels. These may be checked every 5 years, or more frequently if you are over 34 years old. Skin check. Lung cancer screening. You may have this screening every year starting at age 74 if you have a 30-pack-year history of smoking and currently smoke or have quit within the past 15 years. Fecal occult blood test (FOBT) of the stool. You may have this test every year starting at age 74. Flexible sigmoidoscopy or colonoscopy. You may have a sigmoidoscopy every 5 years or a colonoscopy every 10 years starting at age 74. Hepatitis C blood test. Hepatitis B blood test. Sexually transmitted disease (STD) testing. Diabetes screening. This is done by checking your blood sugar (glucose) after you have not eaten for a while (fasting). You may have this done every 1-3 years. Bone density scan. This is done to screen for osteoporosis. You may have this done starting at age 74. Mammogram. This may be done every 1-2 years. Talk to your health care provider about how often you should have regular mammograms. Talk with your health care provider about your test results, treatment options, and if necessary, the need for more tests. Vaccines  Your health care provider may recommend certain vaccines, such as: Influenza vaccine. This is recommended every year. Tetanus, diphtheria, and acellular pertussis (Tdap, Td) vaccine. You may need a Td booster every 10 years. Zoster vaccine. You may need this after age 74. Pneumococcal 13-valent conjugate (PCV13) vaccine. One dose is recommended after age 74. Pneumococcal polysaccharide (PPSV23) vaccine. One dose is recommended after age 74. Talk to your health care provider about which screenings and vaccines you need and how often  you need them. This information is not intended to replace advice given to you by  your health care provider. Make sure you discuss any questions you have with your health care provider. Document Released: 03/13/2015 Document Revised: 11/04/2015 Document Reviewed: 12/16/2014 Elsevier Interactive Patient Education  2017 [place] Prevention in the Home Falls can cause injuries. They can happen to people of all ages. There are many things you can do to make your home safe and to help prevent falls. What can I do on the outside of my home? Regularly fix the edges of walkways and driveways and fix any cracks. Remove anything that might make you trip as you walk through a door, such as a raised step or threshold. Trim any bushes or trees on the path to your home. Use bright outdoor lighting. Clear any walking paths of anything that might make someone trip, such as rocks or tools. Regularly check to see if handrails are loose or broken. Make sure that both sides of any steps have handrails. Any raised decks and porches should have guardrails on the edges. Have any leaves, snow, or ice cleared regularly. Use sand or salt on walking paths during winter. Clean up any spills in your garage right away. This includes oil or grease spills. What can I do in the bathroom? Use night lights. Install grab bars by the toilet and in the tub and shower. Do not use towel bars as grab bars. Use non-skid mats or decals in the tub or shower. If you need to sit down in the shower, use a plastic, non-slip stool. Keep the floor dry. Clean up any water that spills on the floor as soon as it happens. Remove soap buildup in the tub or shower regularly. Attach bath mats securely with double-sided non-slip rug tape. Do not have throw rugs and other things on the floor that can make you trip. What can I do in the bedroom? Use night lights. Make sure that you have a light by your bed that is easy to reach. Do not use any sheets or blankets that are too big for your bed. They should not hang  down onto the floor. Have a firm chair that has side arms. You can use this for support while you get dressed. Do not have throw rugs and other things on the floor that can make you trip. What can I do in the kitchen? Clean up any spills right away. Avoid walking on wet floors. Keep items that you use a lot in easy-to-reach places. If you need to reach something above you, use a strong step stool that has a grab bar. Keep electrical cords out of the way. Do not use floor polish or wax that makes floors slippery. If you must use wax, use non-skid floor wax. Do not have throw rugs and other things on the floor that can make you trip. What can I do with my stairs? Do not leave any items on the stairs. Make sure that there are handrails on both sides of the stairs and use them. Fix handrails that are broken or loose. Make sure that handrails are as long as the stairways. Check any carpeting to make sure that it is firmly attached to the stairs. Fix any carpet that is loose or worn. Avoid having throw rugs at the top or bottom of the stairs. If you do have throw rugs, attach them to the floor with carpet tape. Make sure that you have a light  switch at the top of the stairs and the bottom of the stairs. If you do not have them, ask someone to add them for you. What else can I do to help prevent falls? Wear shoes that: Do not have high heels. Have rubber bottoms. Are comfortable and fit you well. Are closed at the toe. Do not wear sandals. If you use a stepladder: Make sure that it is fully opened. Do not climb a closed stepladder. Make sure that both sides of the stepladder are locked into place. Ask someone to hold it for you, if possible. Clearly mark and make sure that you can see: Any grab bars or handrails. First and last steps. Where the edge of each step is. Use tools that help you move around (mobility aids) if they are needed. These  include: Canes. Walkers. Scooters. Crutches. Turn on the lights when you go into a dark area. Replace any light bulbs as soon as they burn out. Set up your furniture so you have a clear path. Avoid moving your furniture around. If any of your floors are uneven, fix them. If there are any pets around you, be aware of where they are. Review your medicines with your doctor. Some medicines can make you feel dizzy. This can increase your chance of falling. Ask your doctor what other things that you can do to help prevent falls. This information is not intended to replace advice given to you by your health care provider. Make sure you discuss any questions you have with your health care provider. Document Released: 12/11/2008 Document Revised: 07/23/2015 Document Reviewed: 03/21/2014 Elsevier Interactive Patient Education  2017 [employer].

## 2022-07-06 DIAGNOSIS — H26493 Other secondary cataract, bilateral: Secondary | ICD-10-CM | POA: Diagnosis not present

## 2022-07-06 DIAGNOSIS — H04123 Dry eye syndrome of bilateral lacrimal glands: Secondary | ICD-10-CM | POA: Diagnosis not present

## 2022-07-14 ENCOUNTER — Encounter: Payer: Self-pay | Admitting: Family Medicine

## 2022-07-14 ENCOUNTER — Ambulatory Visit (INDEPENDENT_AMBULATORY_CARE_PROVIDER_SITE_OTHER): Payer: Medicare Other | Admitting: Family Medicine

## 2022-07-14 VITALS — BP 110/60 | HR 60 | Temp 97.9°F | Resp 17 | Ht 62.0 in | Wt 152.5 lb

## 2022-07-14 DIAGNOSIS — F418 Other specified anxiety disorders: Secondary | ICD-10-CM | POA: Diagnosis not present

## 2022-07-14 NOTE — Patient Instructions (Signed)
Schedule your complete physical for Feb No med changes at this time I'm so glad that you're feeling better Call with any questions or concerns Have a great summer!!!

## 2022-07-14 NOTE — Progress Notes (Signed)
   Subjective:    Patient ID: Tracy [name], female    DOB: 03-Mar-1950, 72 y.o.   MRN: [id number]  HPI Depression w/ anxiety- pt reports she has not felt tearful or overwhelmed since starting the medication.  Finds herself less irritable.  No improvement in pain levels.  Pt is attempting to remain active.  Pt is not interested in increasing medication- prefers to stay at current dose.   Review of Systems For ROS see HPI     Objective:   Physical Exam Vitals reviewed.  Constitutional:      General: She is not in acute distress.    Appearance: Normal appearance. She is not ill-appearing.  HENT:     Head: Normocephalic and atraumatic.  Skin:    General: Skin is warm and dry.  Neurological:     General: No focal deficit present.     Mental Status: She is alert and oriented to person, place, and time.  Psychiatric:        Mood and Affect: Mood normal.        Behavior: Behavior normal.        Thought Content: Thought content normal.           Assessment & Plan:

## 2022-07-14 NOTE — Assessment & Plan Note (Signed)
Improved w/ addition of Cymbalta 20mg  daily.  Pt feels better overall and would like to keep dose the same.  No changes at this time.

## 2022-08-10 DIAGNOSIS — M19112 Post-traumatic osteoarthritis, left shoulder: Secondary | ICD-10-CM | POA: Diagnosis not present

## 2022-08-10 DIAGNOSIS — M19012 Primary osteoarthritis, left shoulder: Secondary | ICD-10-CM | POA: Diagnosis not present

## 2022-09-19 ENCOUNTER — Encounter: Payer: Self-pay | Admitting: Family Medicine

## 2022-09-19 ENCOUNTER — Ambulatory Visit (INDEPENDENT_AMBULATORY_CARE_PROVIDER_SITE_OTHER): Payer: Medicare Other | Admitting: Family Medicine

## 2022-09-19 VITALS — BP 116/80 | HR 63 | Temp 98.1°F | Resp 17 | Ht 62.0 in | Wt 155.5 lb

## 2022-09-19 DIAGNOSIS — M79605 Pain in left leg: Secondary | ICD-10-CM

## 2022-09-19 DIAGNOSIS — M79604 Pain in right leg: Secondary | ICD-10-CM

## 2022-09-19 DIAGNOSIS — E663 Overweight: Secondary | ICD-10-CM

## 2022-09-19 MED ORDER — CELECOXIB 100 MG PO CAPS: 100.0000 mg | ORAL_CAPSULE | Freq: Every day | ORAL | 3 refills | Status: DC

## 2022-09-19 NOTE — Progress Notes (Signed)
   Subjective:    Patient ID: Tracy [name], female    DOB: May 31, 1950, 72 y.o.   MRN: [id number]  HPI Leg pain- pt reports when she gets in bed her legs develop a pain 'that's in the center of the bone'.  Not muscular.  Pain is more intense recently.  Denies RLS sxs.  Will also have pain w/ prolonged standing but not walking.  Weight gain- pt is frustrated b/c she hasn't changed her diet and is walking daily.  She has only gained 2 lbs since starting Cymbalta on 4/18   Review of Systems For ROS see HPI     Objective:   Physical Exam Vitals reviewed.  Constitutional:      General: She is not in acute distress.    Appearance: Normal appearance. She is not ill-appearing.  HENT:     Head: Normocephalic and atraumatic.  Eyes:     Extraocular Movements: Extraocular movements intact.     Conjunctiva/sclera: Conjunctivae normal.  Cardiovascular:     Rate and Rhythm: Normal rate and regular rhythm.     Heart sounds: No murmur heard. Pulmonary:     Effort: Pulmonary effort is normal. No respiratory distress.     Breath sounds: Normal breath sounds. No wheezing.  Musculoskeletal:        General: No swelling or tenderness.     Right lower leg: No edema.     Left lower leg: No edema.  Skin:    General: Skin is warm and dry.     Findings: No erythema or rash.  Neurological:     Mental Status: She is alert and oriented to person, place, and time. Mental status is at baseline.  Psychiatric:        Behavior: Behavior normal.     Comments: anxious           Assessment & Plan:

## 2022-09-19 NOTE — Assessment & Plan Note (Signed)
Reviewed pt's last few weights and discussed with her that weight has been relatively stable.  She has only gained 2 lbs since starting the Cymbalta.  She thought it was much more.  She is a bit relieved knowing that things have not changed much.  Applauded her efforts at healthy diet and regular exercise.  Will follow.

## 2022-09-19 NOTE — Assessment & Plan Note (Signed)
New.  Pt reports this pain occurs at night or w/ prolonged standing.  States it is not a muscle pain and is not consistent w/ RLS.  She has known OA but is not on any scheduled NSAIDs.  Takes Tylenol 1gm BID.  Will add Celebrex 100mg  daily and monitor for side effects or improvement.  Can titrate if needed.  Pt expressed understanding and is in agreement w/ plan.

## 2022-09-19 NOTE — Patient Instructions (Signed)
Follow up in 6 weeks to recheck pain START the Celebrex once daily- take w/ food CONTINUE the Tylenol twice daily No changes to the Cymbalta for now Thankfully your weight has only changed by 2 lbs.  Do NOT stress about this Call with any questions or concerns Hang in there!!!

## 2022-09-26 ENCOUNTER — Telehealth: Payer: Self-pay | Admitting: Family Medicine

## 2022-09-26 NOTE — Telephone Encounter (Signed)
Patient states that she is currently having issues with celebrex causing her upset stomach and loose stools. She wanted to know if she could come off of it and just continue the cymbalta. I let her know that I would get this message to Dr. Beverely Low and we would give her a call as soon as she responds.

## 2022-09-27 NOTE — Telephone Encounter (Signed)
Ok to hold Celebrex and see if upset stomach and stools return to normal

## 2022-09-27 NOTE — Telephone Encounter (Signed)
I  have informed pt to hold medication and see if her stools return to normal and if the upset stomach calms down . She stated she will let us know how it goes .

## 2022-09-28 ENCOUNTER — Ambulatory Visit: Payer: Medicare Other | Admitting: Family Medicine

## 2022-09-28 ENCOUNTER — Encounter: Payer: Self-pay | Admitting: Family Medicine

## 2022-09-28 VITALS — BP 118/78 | HR 58 | Temp 97.9°F | Resp 17 | Ht 62.0 in | Wt 153.5 lb

## 2022-09-28 DIAGNOSIS — R1013 Epigastric pain: Secondary | ICD-10-CM

## 2022-09-28 DIAGNOSIS — R140 Abdominal distension (gaseous): Secondary | ICD-10-CM | POA: Diagnosis not present

## 2022-09-28 LAB — CBC WITH DIFFERENTIAL/PLATELET
Basophils Absolute: 0 10*3/uL (ref 0.0–0.1)
Basophils Relative: 0.9 % (ref 0.0–3.0)
Eosinophils Absolute: 0.1 10*3/uL (ref 0.0–0.7)
Eosinophils Relative: 2 % (ref 0.0–5.0)
HCT: 40.7 % (ref 36.0–46.0)
Hemoglobin: 13.4 g/dL (ref 12.0–15.0)
Lymphocytes Relative: 23.8 % (ref 12.0–46.0)
Lymphs Abs: 1.3 10*3/uL (ref 0.7–4.0)
MCHC: 32.8 g/dL (ref 30.0–36.0)
MCV: 92.5 fl (ref 78.0–100.0)
Monocytes Absolute: 0.5 10*3/uL (ref 0.1–1.0)
Monocytes Relative: 9.1 % (ref 3.0–12.0)
Neutro Abs: 3.5 10*3/uL (ref 1.4–7.7)
Neutrophils Relative %: 64.2 % (ref 43.0–77.0)
Platelets: 222 10*3/uL (ref 150.0–400.0)
RBC: 4.4 Mil/uL (ref 3.87–5.11)
RDW: 13.9 % (ref 11.5–15.5)
WBC: 5.4 10*3/uL (ref 4.0–10.5)

## 2022-09-28 LAB — BASIC METABOLIC PANEL
BUN: 27 mg/dL — ABNORMAL HIGH (ref 6–23)
CO2: 31 mEq/L (ref 19–32)
Calcium: 9.9 mg/dL (ref 8.4–10.5)
Chloride: 105 mEq/L (ref 96–112)
Creatinine, Ser: 0.94 mg/dL (ref 0.40–1.20)
GFR: 59.78 mL/min — ABNORMAL LOW (ref >60.00)
Glucose, Bld: 81 mg/dL (ref 70–99)
Potassium: 4.2 mEq/L (ref 3.5–5.1)
Sodium: 141 mEq/L (ref 135–145)

## 2022-09-28 LAB — HEPATIC FUNCTION PANEL
ALT: 15 U/L (ref 0–35)
AST: 25 U/L (ref 0–37)
Albumin: 4.1 g/dL (ref 3.5–5.2)
Alkaline Phosphatase: 29 U/L — ABNORMAL LOW (ref 39–117)
Bilirubin, Direct: 0.1 mg/dL (ref 0.0–0.3)
Total Bilirubin: 0.4 mg/dL (ref 0.2–1.2)
Total Protein: 6.9 g/dL (ref 6.0–8.3)

## 2022-09-28 LAB — LIPASE: Lipase: 20 U/L (ref 11.0–59.0)

## 2022-09-28 LAB — AMYLASE: Amylase: 35 U/L (ref 27–131)

## 2022-09-28 NOTE — Progress Notes (Signed)
   Subjective:    Patient ID: Tracy [name], female    DOB: March 27, 1950, 72 y.o.   MRN: [id number]  HPI Abd bloating- pt feels that stomach is 'really really swollen'.  + nausea.  Feels lightheaded.  Thinks this is related to taking the Celebrex.  Just stopped medication yesterday.  Leaves tomorrow for vacation.  No fevers.  BM's have been soft but no constipation.  Abd is not particularly TTP.  Soreness is more epigastric than lower quadrants.  + gas.   Review of Systems For ROS see HPI     Objective:   Physical Exam Vitals reviewed.  Constitutional:      General: She is not in acute distress.    Appearance: She is well-developed. She is not ill-appearing.  HENT:     Head: Normocephalic and atraumatic.  Eyes:     General: No scleral icterus.    Extraocular Movements: Extraocular movements intact.  Cardiovascular:     Rate and Rhythm: Normal rate and regular rhythm.  Pulmonary:     Effort: Pulmonary effort is normal. No respiratory distress.  Abdominal:     General: Bowel sounds are normal. There is no distension.     Palpations: Abdomen is soft. There is no hepatomegaly or mass.     Tenderness: There is abdominal tenderness (mild periumbilical TTP) in the periumbilical area. There is no guarding or rebound. Negative signs include Murphy's sign and McBurney's sign.  Skin:    General: Skin is warm and dry.  Neurological:     General: No focal deficit present.     Mental Status: She is alert.  Psychiatric:        Mood and Affect: Mood normal.        Behavior: Behavior normal.           Assessment & Plan:   Abdominal bloating/pain- new.  Pt reports sxs correlate w/ starting Celebrex.  Just stopped that yesterday.  No TTP over lower quadrants which makes diverticulitis less likely.  Only mild periumbilical TTP.  Will get CBC, CMP, amylase/lipase to assess for possible infection or other abnormality.  In the meantime, encouraged her to eat a bland diet, increase water  intake, and monitor sxs.  Pt expressed understanding and is in agreement w/ plan.

## 2022-09-28 NOTE — Patient Instructions (Signed)
Follow up as needed or as scheduled We'll notify you of your lab results and make any changes if needed Continue to drink LOTS of water to help flush your system Try eating bland things like toast, applesauce, rice, chicken until stomach is feeling better Call with any questions or concerns ENJOY YOUR VACATION!!!

## 2022-09-29 ENCOUNTER — Telehealth: Payer: Self-pay

## 2022-09-29 NOTE — Telephone Encounter (Signed)
-----   Message from Neena Rhymes sent at 09/29/2022  7:26 AM EDT ----- Labs look great!  No evidence of infection, liver, or pancreas issues.

## 2022-09-29 NOTE — Telephone Encounter (Signed)
Pt is aware of lab results.

## 2022-10-03 ENCOUNTER — Other Ambulatory Visit: Payer: Self-pay | Admitting: Family Medicine

## 2022-10-03 ENCOUNTER — Encounter: Payer: Self-pay | Admitting: Family Medicine

## 2022-10-10 ENCOUNTER — Ambulatory Visit: Payer: Medicare Other

## 2022-10-10 DIAGNOSIS — M810 Age-related osteoporosis without current pathological fracture: Secondary | ICD-10-CM

## 2022-10-10 MED ORDER — DENOSUMAB 60 MG/ML ~~LOC~~ SOSY
60.0000 mg | PREFILLED_SYRINGE | Freq: Once | SUBCUTANEOUS | Status: AC
Start: 2022-10-10 — End: 2022-10-10
  Administered 2022-10-10: 60 mg via SUBCUTANEOUS

## 2022-10-10 NOTE — Progress Notes (Addendum)
Tracy [name] is a 72 y.o. female presents to the office today for Prolia: Injections, per physician's orders. Original order: 03/25/2022 Prolia (med), 60mg  (dose),  injection Sub Q (route) was administered LT Deltoid (location) today. Patient tolerated injection. Patient due for follow up labs/provider appt: No. Date due: PRN, appt made No Patient next injection due: 04/2023, appt made No  Tracy [name]

## 2022-10-17 MED ORDER — DENOSUMAB 60 MG/ML ~~LOC~~ SOSY
60.0000 mg | PREFILLED_SYRINGE | Freq: Once | SUBCUTANEOUS | Status: AC
Start: 2022-10-17 — End: 2022-10-10
  Administered 2022-10-10: 60 mg via SUBCUTANEOUS

## 2022-10-17 NOTE — Addendum Note (Signed)
Addended by: Eldred Manges on: 10/17/2022 02:29 PM   Modules accepted: Orders

## 2022-11-01 ENCOUNTER — Ambulatory Visit: Payer: Medicare Other | Admitting: Family Medicine

## 2022-11-02 ENCOUNTER — Encounter: Payer: Self-pay | Admitting: Family Medicine

## 2022-11-04 DIAGNOSIS — M1712 Unilateral primary osteoarthritis, left knee: Secondary | ICD-10-CM | POA: Diagnosis not present

## 2022-11-04 DIAGNOSIS — Z96651 Presence of right artificial knee joint: Secondary | ICD-10-CM | POA: Diagnosis not present

## 2022-11-07 ENCOUNTER — Encounter: Payer: Self-pay | Admitting: Family Medicine

## 2022-11-23 DIAGNOSIS — Z85828 Personal history of other malignant neoplasm of skin: Secondary | ICD-10-CM | POA: Diagnosis not present

## 2022-11-23 DIAGNOSIS — L814 Other melanin hyperpigmentation: Secondary | ICD-10-CM | POA: Diagnosis not present

## 2022-11-23 DIAGNOSIS — L820 Inflamed seborrheic keratosis: Secondary | ICD-10-CM | POA: Diagnosis not present

## 2022-11-23 DIAGNOSIS — L821 Other seborrheic keratosis: Secondary | ICD-10-CM | POA: Diagnosis not present

## 2022-11-23 DIAGNOSIS — C44719 Basal cell carcinoma of skin of left lower limb, including hip: Secondary | ICD-10-CM | POA: Diagnosis not present

## 2022-11-28 DIAGNOSIS — M25562 Pain in left knee: Secondary | ICD-10-CM | POA: Diagnosis not present

## 2022-11-30 DIAGNOSIS — C44719 Basal cell carcinoma of skin of left lower limb, including hip: Secondary | ICD-10-CM | POA: Diagnosis not present

## 2022-12-12 DIAGNOSIS — Z1231 Encounter for screening mammogram for malignant neoplasm of breast: Secondary | ICD-10-CM | POA: Diagnosis not present

## 2022-12-12 LAB — HM MAMMOGRAPHY

## 2022-12-22 ENCOUNTER — Other Ambulatory Visit: Payer: Self-pay | Admitting: Family Medicine

## 2022-12-30 ENCOUNTER — Ambulatory Visit: Payer: Medicare Other | Admitting: Family Medicine

## 2022-12-30 ENCOUNTER — Encounter: Payer: Self-pay | Admitting: Family Medicine

## 2022-12-30 VITALS — BP 130/70 | HR 61 | Temp 97.9°F | Ht 62.0 in | Wt 155.5 lb

## 2022-12-30 DIAGNOSIS — M7061 Trochanteric bursitis, right hip: Secondary | ICD-10-CM | POA: Diagnosis not present

## 2022-12-30 MED ORDER — PREDNISONE 10 MG PO TABS: ORAL_TABLET | ORAL | 0 refills | Status: DC

## 2022-12-30 NOTE — Progress Notes (Signed)
   Subjective:    Patient ID: Tracy [name], female    DOB: 1950/12/19, 72 y.o.   MRN: [id number]  HPI 'i'm having a hard time walking'- pt stopped walking ~2 months ago b/c 'my hips and knees can't handle it'.  She is seeing Dr Lequita Halt who is going to do injxns in L knee weekly x3 weeks.  If that doesn't work, will consider replacement.  Since she has stopped walking, R trochanteric bursitis has returned.  Painful to sit, stand, lie on R side.   Review of Systems For ROS see HPI     Objective:   Physical Exam Vitals reviewed.  Constitutional:      General: She is not in acute distress.    Appearance: Normal appearance. She is not ill-appearing.  HENT:     Head: Normocephalic and atraumatic.  Eyes:     Extraocular Movements: Extraocular movements intact.     Conjunctiva/sclera: Conjunctivae normal.  Musculoskeletal:        General: Tenderness (TTP over R greater trochanter) present.  Skin:    General: Skin is warm and dry.     Findings: No bruising.  Neurological:     Mental Status: She is alert and oriented to person, place, and time. Mental status is at baseline.  Psychiatric:        Mood and Affect: Mood normal.        Behavior: Behavior normal.        Thought Content: Thought content normal.           Assessment & Plan:  Trochanteric Bursitis- recurrent.  R sided.  Following w/ Ortho and is having knee injxns.  Feels that her knee pain has caused hip pain to return.  Will start Prednisone taper and if no improvement will need to discuss w/ Ortho for possible injxn.  Pt expressed understanding and is in agreement w/ plan.

## 2022-12-30 NOTE — Patient Instructions (Signed)
Follow up as needed or as scheduled START the Prednisone as directed- 3 pills at the same time x3 days, then 2 pills at the same time x3 days, then 1 pill daily.  Take w/ food  ICE your R hip Call with any questions or concerns Stay Safe!  Stay Healthy! Hang in there!!

## 2023-01-05 DIAGNOSIS — M1712 Unilateral primary osteoarthritis, left knee: Secondary | ICD-10-CM | POA: Diagnosis not present

## 2023-01-11 DIAGNOSIS — M1712 Unilateral primary osteoarthritis, left knee: Secondary | ICD-10-CM | POA: Diagnosis not present

## 2023-01-13 ENCOUNTER — Ambulatory Visit: Payer: Medicare Other | Admitting: Family Medicine

## 2023-01-17 ENCOUNTER — Encounter: Payer: Self-pay | Admitting: Family Medicine

## 2023-01-17 ENCOUNTER — Ambulatory Visit: Payer: Medicare Other | Admitting: Family Medicine

## 2023-01-17 ENCOUNTER — Telehealth: Payer: Self-pay | Admitting: Family Medicine

## 2023-01-17 VITALS — BP 108/74 | HR 59 | Temp 97.7°F | Ht 62.0 in | Wt 156.0 lb

## 2023-01-17 DIAGNOSIS — R29898 Other symptoms and signs involving the musculoskeletal system: Secondary | ICD-10-CM | POA: Diagnosis not present

## 2023-01-17 DIAGNOSIS — M5410 Radiculopathy, site unspecified: Secondary | ICD-10-CM

## 2023-01-17 NOTE — Telephone Encounter (Addendum)
Caller name: Dollene Primrose Desk   On DPR?: Yes  Call back number:   Provider they see: Sheliah Hatch, MD  Reason for call:   Sent Pt to triage for this reason per Tabori.  Cannot lift rt leg at all when laying down, having trouble walking because rt leg feels heavy.  Patient Name First: Tracy Last: Liam [name] Gender: Female DOB: 05/11/1950 Age: 72 Y 9 M 16 D Return Phone Number: [phone] (Primary) Address: City/ State/ Zip: [place]  [address] Client [facility] [place] Client Site [facility] Provider Lezlie Octave- MD Contact Type Call Who Is Calling Patient / Member / Family / Caregiver Call Type Triage / Clinical Relationship To Patient Self Return Phone Number [phone] (Primary) Chief Complaint NUMBNESS/TINGLING- sudden on one side of the body or face Reason for Call Symptomatic / Request for Health Information Initial Comment Caller states the patient can't lift her right leg and has difficulty walking. States the leg feels heavy and confirms she has had numbness in the leg. She has an appt in the office and they are needing her triaged before the appt. Translation No Nurse Assessment Nurse: Carylon Perches, RN, Hilda Lias Date/Time Lamount Cohen Time): 01/17/2023 7:52:02 AM Confirm and document reason for call. If symptomatic, describe symptoms. ---Caller states she has a numb and painful right leg. It is also weak. She is being treated for bursitis in that leg, but medication is not helping at all. When she lies down it tingles and she can't lift it. She has to make it move when she is walking. Afebrile. Does the patient have any new or worsening symptoms? ---Yes Will a triage be completed? ---Yes Related visit to physician within the last 2 weeks? ---No Does the PT have any chronic conditions? (i.e. diabetes, asthma, this includes High risk factors for pregnancy, etc.) ---No Is this  a behavioral health or substance abuse call? ---No Guidelines Guideline Title Affirmed Question Affirmed Notes Nurse Date/Time Lamount Cohen Time) Neurologic Deficit Back pain (and neurologic deficit) Mordecai Maes 01/17/2023 7:54:50 AM PLEASE NOTE: All timestamps contained within this report are represented as [place] Standard Time. CONFIDENTIALTY NOTICE: This fax transmission is intended only for the addressee. It contains information that is legally privileged, confidential or otherwise protected from use or disclosure. If you are not the intended recipient, you are strictly prohibited from reviewing, disclosing, copying using or disseminating any of this information or taking any action in reliance on or regarding this information. If you have received this fax in error, please notify us immediately by telephone so that we can arrange for its return to [name]. Phone: [phone], Toll-Free: [phone], Fax: [phone] Page: 2 of 2 Call Id: [id number] Disp. Time Lamount Cohen Time) Disposition Final User 01/17/2023 7:48:09 AM Send to Urgent Beulah Gandy 01/17/2023 7:57:02 AM See HCP within 4 Hours (or PCP triage) Yes Carylon Perches, RN, Hilda Lias Final Disposition 01/17/2023 7:57:02 AM See HCP within 4 Hours (or PCP triage) Yes Carylon Perches, RN, Seward Grater Disagree/Comply Comply Caller Understands Yes PreDisposition Did not know what to do Care Advice Given Per Guideline SEE HCP (OR PCP TRIAGE) WITHIN 4 HOURS: * IF OFFICE WILL BE OPEN: You need to be seen within the next 3 or 4 hours. Call your doctor (or NP/PA) now or as soon as the office opens. CALL BACK IF: * You become worse CARE ADVICE given per Neurologic Deficit (Adult) guideline. Referrals GO TO FACILITY UNDECIDED  Pt already has appt for  today with PCP

## 2023-01-17 NOTE — Patient Instructions (Signed)
Follow up as needed or as scheduled Thankfully your strength is intact We'll call you to schedule your MRI Call Dr Verlee Rossetti office and see if you can get in for an appt IF you lose strength or sensation or there is a sudden change in symptoms- go to the ER Call with any questions or concerns Hang in there!!!

## 2023-01-17 NOTE — Telephone Encounter (Signed)
FYI pt sent to triage

## 2023-01-17 NOTE — Progress Notes (Signed)
   Subjective:    Patient ID: Tracy [name], female    DOB: 1951/02/03, 72 y.o.   MRN: [id number]  HPI R leg pain- pt reports she is not able to lift her leg when she is lying down.  Pt had back surgery w/ Dr Danielle Dess and she also called his office.  She will have pain 'all the way down' the front of her leg.  Also having a hard time walking due to leg heaviness.  Has a difficult time finding a comfortable position due to ongoing back pain.  Also has bursitis that did not respond to recent Prednisone tx.  Previously had to get injxns.     Review of Systems For ROS see HPI     Objective:   Physical Exam Vitals reviewed.  Constitutional:      General: She is not in acute distress.    Appearance: Normal appearance. She is not ill-appearing.  HENT:     Head: Normocephalic and atraumatic.  Cardiovascular:     Pulses: Normal pulses.  Musculoskeletal:        General: Tenderness (TTP over R greater trochanter, also at R SI joint) present.     Comments: Manual muscle testing of lower extremities was symmetric when testing abductors, adductors, hip flexion  Skin:    General: Skin is warm and dry.     Findings: No rash.  Neurological:     Mental Status: She is alert.     Gait: Gait abnormal (antalgic gait).  Psychiatric:     Comments: anxious           Assessment & Plan:  Radicular low back pain/Radicular leg pain/leg weakness- new.  Pt had back surgery in 2010 w/ Dr Danielle Dess.  Now reports she is unable to move her leg when lying down at night.  During the day leg feels heavy and weak.  Given that sxs are pronounced with position changes, concern for impingement or structural abnormality.  Will get lumbar MRI to assess.  Pt to call Dr Verlee Rossetti office and get an appt ASAP.  Reviewed supportive care and red flags that should prompt return.  Pt expressed understanding and is in agreement w/ plan.

## 2023-01-18 ENCOUNTER — Ambulatory Visit (HOSPITAL_COMMUNITY)
Admission: RE | Admit: 2023-01-18 | Discharge: 2023-01-18 | Disposition: A | Discharge status: Home / Self Care | Payer: Medicare Other | Source: Ambulatory Visit | Attending: Family Medicine | Admitting: Family Medicine

## 2023-01-18 DIAGNOSIS — M5126 Other intervertebral disc displacement, lumbar region: Secondary | ICD-10-CM | POA: Diagnosis not present

## 2023-01-18 DIAGNOSIS — R29898 Other symptoms and signs involving the musculoskeletal system: Secondary | ICD-10-CM | POA: Diagnosis not present

## 2023-01-18 DIAGNOSIS — M5410 Radiculopathy, site unspecified: Secondary | ICD-10-CM | POA: Diagnosis not present

## 2023-01-18 DIAGNOSIS — M48061 Spinal stenosis, lumbar region without neurogenic claudication: Secondary | ICD-10-CM | POA: Diagnosis not present

## 2023-01-18 DIAGNOSIS — M4807 Spinal stenosis, lumbosacral region: Secondary | ICD-10-CM | POA: Diagnosis not present

## 2023-01-18 DIAGNOSIS — M5125 Other intervertebral disc displacement, thoracolumbar region: Secondary | ICD-10-CM | POA: Diagnosis not present

## 2023-01-19 ENCOUNTER — Telehealth: Payer: Self-pay

## 2023-01-19 DIAGNOSIS — M1712 Unilateral primary osteoarthritis, left knee: Secondary | ICD-10-CM | POA: Diagnosis not present

## 2023-01-19 NOTE — Telephone Encounter (Signed)
Pt has been reviewed via MyChart

## 2023-01-19 NOTE — Telephone Encounter (Signed)
-----   Message from Neena Rhymes sent at 01/18/2023  2:57 PM EST ----- Your MRI shows progressing spinal stenosis- R>L.  This is likely the cause of your symptoms.  Please call Dr Verlee Rossetti office and schedule an appt ASAP to determine how best to proceed.

## 2023-01-20 ENCOUNTER — Ambulatory Visit (INDEPENDENT_AMBULATORY_CARE_PROVIDER_SITE_OTHER): Payer: Medicare Other | Admitting: Family Medicine

## 2023-01-20 ENCOUNTER — Encounter (INDEPENDENT_AMBULATORY_CARE_PROVIDER_SITE_OTHER): Payer: Medicare Other | Admitting: Family Medicine

## 2023-01-20 ENCOUNTER — Encounter: Payer: Self-pay | Admitting: Family Medicine

## 2023-01-20 ENCOUNTER — Telehealth: Payer: Self-pay | Admitting: Family Medicine

## 2023-01-20 DIAGNOSIS — R350 Frequency of micturition: Secondary | ICD-10-CM

## 2023-01-20 LAB — POCT URINALYSIS DIPSTICK
Bilirubin, UA: NEGATIVE
Blood, UA: NEGATIVE
Glucose, UA: NEGATIVE
Ketones, UA: NEGATIVE
Nitrite, UA: NEGATIVE
Protein, UA: NEGATIVE
Spec Grav, UA: 1.01 (ref 1.010–1.025)
Urobilinogen, UA: 0.2 U/dL
pH, UA: 7 (ref 5.0–8.0)

## 2023-01-20 MED ORDER — CEPHALEXIN 500 MG PO CAPS: 500.0000 mg | ORAL_CAPSULE | Freq: Two times a day (BID) | ORAL | 0 refills | Status: AC

## 2023-01-20 NOTE — Telephone Encounter (Signed)
She can come by for a nurse visit for a UA and possible culture but she needs to send me a MyChart message w/ her symptoms so we have documentation for this

## 2023-01-20 NOTE — Telephone Encounter (Signed)
Pt has been advised and will come by office before we close

## 2023-01-20 NOTE — Progress Notes (Signed)
Per PCP pt could drop off Urine Office for urine culture

## 2023-01-20 NOTE — Telephone Encounter (Signed)
[facility] VISIT   Patient agreed to [facility] visit and is aware that copayment and coinsurance may apply. Patient was treated using telemedicine according to accepted telemedicine protocols.  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   Shoulder arthritis 03/01/2017   Perforation of sigmoid colon due to diverticulitis 07/05/2016   OA (osteoarthritis) of knee 08/17/2015   Thyroid nodule, 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   Subclinical hyperthyroidism 06/05/2014   Trochanteric bursitis of left hip 04/01/2014   Rosacea 03/26/2014   Allergic rhinitis 02/19/2014   Left hip pain 12/19/2013   Atrophic vaginitis 10/07/2013   Screening for malignant neoplasm of cervix 01/12/2011   Physical exam 01/12/2011   HYPERTRIGLYCERIDEMIA 12/03/2009   GERD 12/03/2009   UTI'S, RECURRENT 12/03/2009   LEG CRAMPS, NOCTURNAL 12/03/2009   DIVERTICULITIS, HX OF 12/03/2009   Depression with anxiety 11/05/2009   Osteoporosis 11/05/2009   Social History   Tobacco Use   Smoking status: Never   Smokeless tobacco: Never  Substance Use Topics   Alcohol use: No    Current Outpatient Medications:    Acetaminophen Extra Strength 500 MG TABS, , Disp: , Rfl:    Calcium Carbonate-Vitamin D (CALCIUM 600+D PO), Take 1 tablet by mouth 2 (two) times daily., Disp: , Rfl:    Cholecalciferol (VITAMIN D) 2000 units tablet, Take 2,000 Units by mouth daily., Disp: , Rfl:    DULoxetine (CYMBALTA) 20 MG capsule, TAKE 1 CAPSULE(20 MG) BY MOUTH DAILY, Disp: 30 capsule, Rfl: 3   fenofibrate 160 MG tablet, TAKE 1 TABLET BY MOUTH DAILY,  Disp: 100 tablet, Rfl: 2   Melatonin 5 MG TABS, Take 5 mg by mouth at bedtime., Disp: , Rfl:    Multiple Vitamin (MULTIVITAMIN WITH MINERALS) TABS tablet, Take 1 tablet by mouth daily., Disp: , Rfl:    pantoprazole (PROTONIX) 40 MG tablet, TAKE 1 TABLET BY MOUTH TWICE  DAILY, Disp: 200 tablet, Rfl: 2   polyethylene glycol (MIRALAX / GLYCOLAX) packet, Take 17 g by mouth daily., Disp: 14 each, Rfl: 0  Allergies  Allergen Reactions   Amoxicillin-Pot Clavulanate Diarrhea    Severe diarrhea Severe diarrhea Severe diarrhea   Nylon Hives   Demerol [Meperidine] Nausea And Vomiting   Latex Rash, Other (See Comments) and Hives    Reaction:  Blisters    Other Itching    Nylon sheets, pt prefers cotton sheets    Tramadol Nausea And Vomiting    Assessment and Plan:   Diagnosis: urinary frequency. Please see myChart communication and orders below.   No orders of the defined types were placed in this encounter.  No orders of the defined types were placed in this encounter.   Neena Rhymes, MD 01/20/2023  A total of 13 minutes were spent by me to personally review the patient-generated inquiry, review patient records and data pertinent to assessment of the patient's problem, develop a management plan including generation of prescriptions and/or orders, and on subsequent communication with the patient through secure the MyChart portal service.   There is no separately reported E/M service related to this service in the past 7 days nor does the patient  have an upcoming soonest available appointment for this issue. This work was completed in less than 7 days.   The patient consented to this service today (see patient agreement prior to ongoing communication). Patient counseled regarding the need for in-person exam for certain conditions and was advised to call the office if any changing or worsening symptoms occur.   The codes to be used for the E/M service are: []   99421 for 5-10 minutes of  time spent on the inquiry. [x]   [id number] for 11-20 minutes. []   [id number] for 21+ minutes.

## 2023-01-20 NOTE — Telephone Encounter (Signed)
Caller name: RODNESHA ENGHOLM  On DPR?: Yes  Call back number: [phone] (home)  Provider they see: Sheliah Hatch, MD  Reason for call:   Pt would like to stop by and drop urine sample being tested for UTI- advise

## 2023-01-20 NOTE — Addendum Note (Signed)
Addended by: Sheliah Hatch on: 01/20/2023 04:26 PM   Modules accepted: Orders

## 2023-01-21 LAB — URINE CULTURE
MICRO NUMBER:: 15769011
Result:: NO GROWTH
SPECIMEN QUALITY:: ADEQUATE

## 2023-01-23 ENCOUNTER — Telehealth: Payer: Self-pay

## 2023-01-23 ENCOUNTER — Encounter: Payer: Self-pay | Admitting: Family Medicine

## 2023-01-23 NOTE — Telephone Encounter (Signed)
Pt has reviewed via MyChart

## 2023-01-23 NOTE — Telephone Encounter (Signed)
-----   Message from Neena Rhymes sent at 01/23/2023  7:26 AM EST ----- No evidence of UTI.  This is good news but please let me know how you are feeling

## 2023-01-24 NOTE — Telephone Encounter (Signed)
This has been asked and forward to Dr.Tabori in another message

## 2023-01-25 ENCOUNTER — Emergency Department (HOSPITAL_BASED_OUTPATIENT_CLINIC_OR_DEPARTMENT_OTHER)
Admission: EM | Admit: 2023-01-25 | Discharge: 2023-01-25 | Disposition: A | Discharge status: Home / Self Care | Payer: Medicare Other | Attending: Emergency Medicine | Admitting: Emergency Medicine

## 2023-01-25 ENCOUNTER — Encounter (HOSPITAL_BASED_OUTPATIENT_CLINIC_OR_DEPARTMENT_OTHER): Payer: Self-pay | Admitting: Emergency Medicine

## 2023-01-25 ENCOUNTER — Other Ambulatory Visit: Payer: Self-pay

## 2023-01-25 DIAGNOSIS — G8929 Other chronic pain: Secondary | ICD-10-CM | POA: Insufficient documentation

## 2023-01-25 DIAGNOSIS — M4800 Spinal stenosis, site unspecified: Secondary | ICD-10-CM | POA: Diagnosis not present

## 2023-01-25 DIAGNOSIS — M545 Low back pain, unspecified: Secondary | ICD-10-CM | POA: Diagnosis present

## 2023-01-25 DIAGNOSIS — Z91040 Latex allergy status: Secondary | ICD-10-CM | POA: Diagnosis not present

## 2023-01-25 DIAGNOSIS — M48061 Spinal stenosis, lumbar region without neurogenic claudication: Secondary | ICD-10-CM | POA: Diagnosis not present

## 2023-01-25 DIAGNOSIS — M5441 Lumbago with sciatica, right side: Secondary | ICD-10-CM | POA: Diagnosis not present

## 2023-01-25 MED ORDER — IBUPROFEN 400 MG PO TABS
600.0000 mg | ORAL_TABLET | Freq: Once | ORAL | Status: AC
  Administered 2023-01-25: 600 mg via ORAL
  Filled 2023-01-25: qty 1

## 2023-01-25 MED ORDER — LIDOCAINE 5 % EX PTCH
1.0000 | MEDICATED_PATCH | CUTANEOUS | Status: DC
  Administered 2023-01-25: 1 via TRANSDERMAL
  Filled 2023-01-25: qty 1

## 2023-01-25 MED ORDER — LIDOCAINE 5 % EX PTCH: 1.0000 | MEDICATED_PATCH | CUTANEOUS | 0 refills | Status: DC

## 2023-01-25 MED ORDER — PREDNISONE 50 MG PO TABS
60.0000 mg | ORAL_TABLET | Freq: Once | ORAL | Status: AC
  Administered 2023-01-25: 60 mg via ORAL
  Filled 2023-01-25: qty 1

## 2023-01-25 MED ORDER — GABAPENTIN 300 MG PO CAPS
300.0000 mg | ORAL_CAPSULE | Freq: Once | ORAL | Status: AC
  Administered 2023-01-25: 300 mg via ORAL
  Filled 2023-01-25: qty 1

## 2023-01-25 NOTE — ED Triage Notes (Signed)
C/o severe lower right side back pain. Chronic back pain d/t spinal stenosis. Unable to get with primary doctor until January. Patient states pain is uncontrollable. Denies urinary symptoms.

## 2023-01-25 NOTE — ED Provider Notes (Signed)
[place] EMERGENCY DEPARTMENT AT [facility] Provider Note   CSN: [id number] Arrival date & time: 01/25/23  1526     History  Chief Complaint  Patient presents with   Back Pain    Tracy [name] is a 72 y.o. female.  With a history of chronic back pain, spinal stenosis and osteoarthritis who presents to the ED for back pain.  Patient reports 1 month of ongoing right lower back pain with radiation down right lower extremity.  For this reason she had an outpatient MRI taken last week which showed multiple levels of spinal stenosis.  Has been able to ambulate although with some discomfort.  Was able to schedule an outpatient appointment with her neurosurgery team but not until January and was in too much discomfort at home to wait this long.  Has been taking only Tylenol for pain relief.  Last dose was 0800.  No urinary incontinence, urinary retention, fecal incontinence, paresthesias or recent fall/trauma.  Denies fevers chills no other complaints this time.   Back Pain      Home Medications Prior to Admission medications   Medication Sig Start Date End Date Taking? Authorizing Provider  lidocaine (LIDODERM) 5 % Place 1 patch onto the skin daily. Remove & Discard patch within 12 hours or as directed by MD 01/25/23  Yes Royanne Foots, DO  Acetaminophen Extra Strength 500 MG TABS     [provider]  Calcium Carbonate-Vitamin D (CALCIUM 600+D PO) Take 1 tablet by mouth 2 (two) times daily.    [provider]  cephALEXin (KEFLEX) 500 MG capsule Take 1 capsule (500 mg total) by mouth 2 (two) times daily for 10 doses. 01/20/23 01/25/23  Sheliah Hatch, MD  Cholecalciferol (VITAMIN D) 2000 units tablet Take 2,000 Units by mouth daily.    [provider]  DULoxetine (CYMBALTA) 20 MG capsule TAKE 1 CAPSULE(20 MG) BY MOUTH DAILY 10/03/22   Sheliah Hatch, MD  fenofibrate 160 MG tablet TAKE 1 TABLET BY MOUTH DAILY 12/22/22   Sheliah Hatch, MD  Melatonin 5 MG TABS Take 5 mg by mouth at bedtime.    [provider]  Multiple Vitamin (MULTIVITAMIN WITH MINERALS) TABS tablet Take 1 tablet by mouth daily.    [provider]  pantoprazole (PROTONIX) 40 MG tablet TAKE 1 TABLET BY MOUTH TWICE  DAILY 05/30/22   Sheliah Hatch, MD  polyethylene glycol (MIRALAX / GLYCOLAX) packet Take 17 g by mouth daily. 08/29/16   Waldon Merl, PA-C      Allergies    Amoxicillin-pot clavulanate, Nylon, Demerol [meperidine], Latex, Other, and Tramadol    Review of Systems   Review of Systems  Musculoskeletal:  Positive for back pain.    Physical Exam Updated Vital Signs BP (!) 145/80 (BP Location: Left Arm)   Pulse 75   Temp 97.7 F (36.5 C) (Oral)   Resp 18   Ht 5\' 2"  (1.575 m)   Wt 68 kg   SpO2 98%   BMI 27.44 kg/m  Physical Exam Vitals and nursing note reviewed.  HENT:     Head: Normocephalic and atraumatic.  Eyes:     Pupils: Pupils are equal, round, and reactive to light.  Cardiovascular:     Rate and Rhythm: Normal rate and regular rhythm.  Pulmonary:     Effort: Pulmonary effort is normal.     Breath sounds: Normal breath sounds.  Abdominal:     Palpations: Abdomen is soft.  Tenderness: There is no abdominal tenderness.  Musculoskeletal:     Comments: No midline tenderness step-off deformity Right paraspinal lumbar tenderness as well as tenderness over the right buttock  Skin:    General: Skin is warm and dry.  Neurological:     General: No focal deficit present.     Mental Status: She is alert.     Sensory: No sensory deficit.     Motor: No weakness.  Psychiatric:        Mood and Affect: Mood normal.     ED Results / Procedures / Treatments   Labs (all labs ordered are listed, but only abnormal results are displayed) Labs Reviewed - No data to display  EKG None  Radiology No results found.  Procedures Procedures    Medications Ordered in ED Medications  lidocaine  (LIDODERM) 5 % 1 patch (1 patch Transdermal Patch Applied 01/25/23 1751)  ibuprofen (ADVIL) tablet 600 mg (600 mg Oral Given 01/25/23 1751)  gabapentin (NEURONTIN) capsule 300 mg (300 mg Oral Given 01/25/23 1751)  predniSONE (DELTASONE) tablet 60 mg (60 mg Oral Given 01/25/23 1752)    ED Course/ Medical Decision Making/ A&P Clinical Course as of 01/25/23 1852  Wed Jan 25, 2023  1850 Patient reports significant improvement in her lower back pain and right lower extremity pain after above interventions.  Will give her prescription for lidocaine patches to pick up from her pharmacy.  She will follow-up with her neurosurgery team.  Return precautions discussed with her in detail [MP]    Clinical Course User Index [MP] Royanne Foots, DO                                 Medical Decision Making 72 year old female with history as above including recent MRI showing spinal stenosis presenting for worsening right-sided back pain with radiation down right lower extremity.  Severe pain in this region for the last month or so.  No midline tenderness on my exam.  Exam notable only for right paraspinal lumbar tenderness with radiation down the right lower extremity and buttock.  No sensory or neurodeficits.  Low suspicion for acute process such as spinal epidural abscess or cauda equina.  This is most likely related to chronic spinal stenosis.  She is only taken acetaminophen at home.  Will trial multimodal approach here to get a more comfortable including lidocaine patch, prednisone, gabapentin and ibuprofen  Risk Prescription drug management.           Final Clinical Impression(s) / ED Diagnoses Final diagnoses:  Chronic right-sided low back pain with right-sided sciatica  Spinal stenosis, unspecified spinal region    Rx / DC Orders ED Discharge Orders          Ordered    lidocaine (LIDODERM) 5 %  Every 24 hours        01/25/23 1851              Royanne Foots, DO 01/25/23  1852

## 2023-01-25 NOTE — Discharge Instructions (Addendum)
You were seen in the Emergency Department for lower back pain This is most likely related to your chronic spinal stenosis Your pain improved after a lidocaine patch, ibuprofen, a dose of prednisone and gabapentin We have called in a prescription for lidocaine patch for you to pick up from your pharmacy and begin taking as directed for lower back pain You can also take Tylenol Motrin as directed for discomfort Is important that you follow-up with your neurosurgery team Please call the office during the next business day to schedule an appointment to be seen.  Hopefully they can see you sooner than January Return to the emerged part for severe pain, if you are unable to walk, unable to use your right leg at all or have any other concerns

## 2023-01-29 ENCOUNTER — Encounter: Payer: Self-pay | Admitting: Family Medicine

## 2023-01-30 ENCOUNTER — Other Ambulatory Visit: Payer: Self-pay

## 2023-01-30 ENCOUNTER — Telehealth: Payer: Self-pay | Admitting: Family Medicine

## 2023-01-30 MED ORDER — FENOFIBRATE 160 MG PO TABS: 160.0000 mg | ORAL_TABLET | Freq: Every day | ORAL | 3 refills | Status: DC

## 2023-01-30 MED ORDER — DULOXETINE HCL 20 MG PO CPEP: 20.0000 mg | ORAL_CAPSULE | Freq: Every day | ORAL | 3 refills | Status: DC

## 2023-01-30 NOTE — Telephone Encounter (Signed)
Pt is stating Dr Danielle Dess appt is Jan 15 and she can't wait that long- advise

## 2023-02-24 NOTE — Telephone Encounter (Signed)
error 

## 2023-03-03 DIAGNOSIS — M48061 Spinal stenosis, lumbar region without neurogenic claudication: Secondary | ICD-10-CM | POA: Diagnosis not present

## 2023-03-07 DIAGNOSIS — M5116 Intervertebral disc disorders with radiculopathy, lumbar region: Secondary | ICD-10-CM | POA: Diagnosis not present

## 2023-03-07 DIAGNOSIS — M5416 Radiculopathy, lumbar region: Secondary | ICD-10-CM | POA: Diagnosis not present

## 2023-04-11 ENCOUNTER — Ambulatory Visit (INDEPENDENT_AMBULATORY_CARE_PROVIDER_SITE_OTHER): Payer: Medicare Other | Admitting: Family Medicine

## 2023-04-11 ENCOUNTER — Encounter: Payer: Self-pay | Admitting: Family Medicine

## 2023-04-11 ENCOUNTER — Encounter: Payer: Medicare Other | Admitting: Family Medicine

## 2023-04-11 VITALS — BP 128/74 | HR 98 | Temp 98.7°F | Ht 64.5 in | Wt 157.0 lb

## 2023-04-11 DIAGNOSIS — M810 Age-related osteoporosis without current pathological fracture: Secondary | ICD-10-CM | POA: Diagnosis not present

## 2023-04-11 DIAGNOSIS — E781 Pure hyperglyceridemia: Secondary | ICD-10-CM

## 2023-04-11 DIAGNOSIS — Z0000 Encounter for general adult medical examination without abnormal findings: Secondary | ICD-10-CM | POA: Diagnosis not present

## 2023-04-11 LAB — LIPID PANEL
Cholesterol: 181 mg/dL (ref 0–200)
HDL: 79.9 mg/dL (ref >39.00)
LDL Cholesterol: 82 mg/dL (ref 0–99)
NonHDL: 101.51
Total CHOL/HDL Ratio: 2
Triglycerides: 97 mg/dL (ref 0.0–149.0)
VLDL: 19.4 mg/dL (ref 0.0–40.0)

## 2023-04-11 LAB — CBC WITH DIFFERENTIAL/PLATELET
Basophils Absolute: 0 10*3/uL (ref 0.0–0.1)
Basophils Relative: 0.7 % (ref 0.0–3.0)
Eosinophils Absolute: 0.1 10*3/uL (ref 0.0–0.7)
Eosinophils Relative: 1.9 % (ref 0.0–5.0)
HCT: 40.8 % (ref 36.0–46.0)
Hemoglobin: 13.5 g/dL (ref 12.0–15.0)
Lymphocytes Relative: 21.3 % (ref 12.0–46.0)
Lymphs Abs: 1.5 10*3/uL (ref 0.7–4.0)
MCHC: 33 g/dL (ref 30.0–36.0)
MCV: 92.5 fL (ref 78.0–100.0)
Monocytes Absolute: 0.7 10*3/uL (ref 0.1–1.0)
Monocytes Relative: 9.4 % (ref 3.0–12.0)
Neutro Abs: 4.7 10*3/uL (ref 1.4–7.7)
Neutrophils Relative %: 66.7 % (ref 43.0–77.0)
Platelets: 269 10*3/uL (ref 150.0–400.0)
RBC: 4.41 Mil/uL (ref 3.87–5.11)
RDW: 13.6 % (ref 11.5–15.5)
WBC: 7 10*3/uL (ref 4.0–10.5)

## 2023-04-11 LAB — BASIC METABOLIC PANEL
BUN: 27 mg/dL — ABNORMAL HIGH (ref 6–23)
CO2: 32 meq/L (ref 19–32)
Calcium: 9.9 mg/dL (ref 8.4–10.5)
Chloride: 104 meq/L (ref 96–112)
Creatinine, Ser: 1.01 mg/dL (ref 0.40–1.20)
GFR: 54.64 mL/min — ABNORMAL LOW (ref >60.00)
Glucose, Bld: 77 mg/dL (ref 70–99)
Potassium: 4.2 meq/L (ref 3.5–5.1)
Sodium: 143 meq/L (ref 135–145)

## 2023-04-11 LAB — HEPATIC FUNCTION PANEL
ALT: 14 U/L (ref 0–35)
AST: 23 U/L (ref 0–37)
Albumin: 4 g/dL (ref 3.5–5.2)
Alkaline Phosphatase: 31 U/L — ABNORMAL LOW (ref 39–117)
Bilirubin, Direct: 0.1 mg/dL (ref 0.0–0.3)
Total Bilirubin: 0.5 mg/dL (ref 0.2–1.2)
Total Protein: 6.9 g/dL (ref 6.0–8.3)

## 2023-04-11 LAB — VITAMIN D 25 HYDROXY (VIT D DEFICIENCY, FRACTURES): VITD: 54.78 ng/mL (ref 30.00–100.00)

## 2023-04-11 LAB — TSH: TSH: 0.38 u[IU]/mL (ref 0.35–5.50)

## 2023-04-11 MED ORDER — PANTOPRAZOLE SODIUM 40 MG PO TBEC: 40.0000 mg | DELAYED_RELEASE_TABLET | Freq: Two times a day (BID) | ORAL | 2 refills | Status: AC

## 2023-04-11 NOTE — Patient Instructions (Signed)
Follow up in 6 months to recheck cholesterol We'll notify you of your lab results and make any changes if needed Call and schedule w/ Ortho about the knees Call with any questions or concerns Stay Safe!  Stay Healthy! Happy [birth year] [employer]!

## 2023-04-11 NOTE — Progress Notes (Signed)
Subjective:    Patient ID: Tracy [name], female    DOB: 1951-02-08, 73 y.o.   MRN: [id number]  HPI CPE- UTD on colonoscopy, mammo, PNA, flu.  Patient Care Team    Relationship Specialty Notifications Start End  Sheliah Hatch, MD PCP - General Family Medicine  04/11/22   Runell Gess, MD PCP - Cardiology Cardiology  10/29/18   Carlus Pavlov, MD Consulting Physician Internal Medicine  03/13/15   Jerilee Field, MD Consulting Physician Urology  03/13/15   Ollen Gross, MD Consulting Physician Orthopedic Surgery  05/19/16   Aris Lot, MD Consulting Physician Dermatology  05/19/16   Napoleon Form, MD Consulting Physician Gastroenterology  09/07/16   Dimitri Ped, MD Consulting Physician Surgery  11/28/17    Comment: Opthalmology   Beverely Low, MD Consulting Physician Orthopedic Surgery  11/28/17   Toni Arthurs, MD Consulting Physician Orthopedic Surgery  11/28/17    Comment: foot  Dahlia Byes, [facility] (Inactive) Pharmacist Pharmacist  11/13/19    Comment: [phone]  Ollen Gross, MD Consulting Physician Orthopedic Surgery  12/30/22     Health Maintenance  Topic Date Due   Zoster Vaccines- Shingrix (1 of 2) Never done   DTaP/Tdap/Td (2 - Td or Tdap) 06/29/2022   COVID-19 Vaccine (7 - 2024-25 season) 10/30/2022   Medicare Annual Wellness (AWV)  06/29/2023   MAMMOGRAM  12/12/2023   Colonoscopy  01/06/2027   Pneumonia Vaccine 79+ Years old  Completed   INFLUENZA VACCINE  Completed   DEXA SCAN  Completed   Hepatitis C Screening  Completed   HPV VACCINES  Aged Out      Review of Systems Patient reports no vision/ hearing changes, adenopathy,fever, weight change,  persistant/recurrent hoarseness , swallowing issues, chest pain, palpitations, edema, persistant/recurrent cough, hemoptysis, dyspnea (rest/exertional/paroxysmal nocturnal), gastrointestinal bleeding (melena, rectal bleeding), abdominal pain, significant heartburn, bowel changes, GU  symptoms (dysuria, hematuria, incontinence), Gyn symptoms (abnormal  bleeding, pain),  syncope, focal weakness, memory loss, skin/hair/nail changes, abnormal bruising or bleeding, anxiety, or depression.   + neuropathy of feet    Objective:   Physical Exam General Appearance:    Alert, cooperative, no distress, appears stated age  Head:    Normocephalic, without obvious abnormality, atraumatic  Eyes:    PERRL, conjunctiva/corneas clear, EOM's intact both eyes  Ears:    Normal TM's and external ear canals, both ears  Nose:   Nares normal, septum midline, mucosa normal, no drainage    or sinus tenderness  Throat:   Lips, mucosa, and tongue normal; teeth and gums normal  Neck:   Supple, symmetrical, trachea midline, no adenopathy;    Thyroid: no enlargement/tenderness/nodules  Back:     Symmetric, no curvature, ROM normal, no CVA tenderness  Lungs:     Clear to auscultation bilaterally, respirations unlabored  Chest Wall:    No tenderness or deformity   Heart:    Regular rate and rhythm, S1 and S2 normal, no murmur, rub   or gallop  Breast Exam:    Deferred to mammo  Abdomen:     Soft, non-tender, bowel sounds active all four quadrants,    no masses, no organomegaly  Genitalia:    Deferred to GYN  Rectal:    Extremities:   Extremities normal, atraumatic, no cyanosis or edema  Pulses:   2+ and symmetric all extremities  Skin:   Skin color, texture, turgor normal, no rashes or lesions  Lymph nodes:   Cervical, supraclavicular, and axillary nodes normal  Neurologic:   CNII-XII intact          Assessment & Plan:

## 2023-04-11 NOTE — Assessment & Plan Note (Signed)
Pt's PE WNL w/ exception of antalgic gait due to knee pain.  UTD on mammo, colonoscopy, PNA, flu.  Check labs.  Anticipatory guidance provided.

## 2023-04-11 NOTE — Assessment & Plan Note (Signed)
Check Vit D and replete prn.

## 2023-04-12 ENCOUNTER — Encounter: Payer: Self-pay | Admitting: Family Medicine

## 2023-04-12 ENCOUNTER — Telehealth: Payer: Self-pay

## 2023-04-12 NOTE — Telephone Encounter (Signed)
Pt has reviewed labs via MyChart

## 2023-04-12 NOTE — Telephone Encounter (Signed)
-----   Message from Neena Rhymes sent at 04/12/2023  7:28 AM EST ----- Labs are stable and look good!  No changes at this time

## 2023-04-13 DIAGNOSIS — M1712 Unilateral primary osteoarthritis, left knee: Secondary | ICD-10-CM | POA: Diagnosis not present

## 2023-04-13 DIAGNOSIS — M25562 Pain in left knee: Secondary | ICD-10-CM | POA: Diagnosis not present

## 2023-04-13 DIAGNOSIS — Z96651 Presence of right artificial knee joint: Secondary | ICD-10-CM | POA: Insufficient documentation

## 2023-04-14 ENCOUNTER — Encounter: Payer: Self-pay | Admitting: Family Medicine

## 2023-04-21 ENCOUNTER — Telehealth: Payer: Self-pay | Admitting: Family Medicine

## 2023-04-21 NOTE — Telephone Encounter (Signed)
Type of form received: Surgical Clearance - Appt scheduled 07/03/23  Additional comments:   Received by: Wilford Sports - Front Desk  Form should be Faxed/mailed to: (address/ fax #) Fax to [phone]  Is patient requesting call for pickup: N/A  Form placed:  Labeled & placed in provider bin  Attach charge sheet.  Provider will determine charge.  Individual made aware of 3-5 business day turn around? N/A

## 2023-04-21 NOTE — Telephone Encounter (Signed)
 Paperwork has been collected and placed in PCPs sign folder

## 2023-04-22 ENCOUNTER — Other Ambulatory Visit: Payer: Self-pay | Admitting: Family Medicine

## 2023-05-02 NOTE — Telephone Encounter (Signed)
 Pt is not scheduled until May for this surgical clearance.  Will hold forms

## 2023-05-04 DIAGNOSIS — H26493 Other secondary cataract, bilateral: Secondary | ICD-10-CM | POA: Diagnosis not present

## 2023-05-04 DIAGNOSIS — Z961 Presence of intraocular lens: Secondary | ICD-10-CM | POA: Diagnosis not present

## 2023-05-04 DIAGNOSIS — H04123 Dry eye syndrome of bilateral lacrimal glands: Secondary | ICD-10-CM | POA: Diagnosis not present

## 2023-05-04 DIAGNOSIS — H524 Presbyopia: Secondary | ICD-10-CM | POA: Diagnosis not present

## 2023-05-05 ENCOUNTER — Encounter: Payer: Self-pay | Admitting: Family Medicine

## 2023-05-16 ENCOUNTER — Encounter: Payer: Self-pay | Admitting: Family Medicine

## 2023-05-16 DIAGNOSIS — R0981 Nasal congestion: Secondary | ICD-10-CM

## 2023-05-16 MED ORDER — FLUTICASONE PROPIONATE 50 MCG/ACT NA SUSP: NASAL | 3 refills | Status: DC

## 2023-05-16 NOTE — Telephone Encounter (Signed)
 Medication is not on current med list, found in hxt med list  Last ordered on 03/21/2022 and the prescription noted patient needed an office visit before further refills, Patient was last seen on 04/11/2023. Is it okay to fill this medication for patient under you?

## 2023-05-17 DIAGNOSIS — D485 Neoplasm of uncertain behavior of skin: Secondary | ICD-10-CM | POA: Diagnosis not present

## 2023-05-17 DIAGNOSIS — Q828 Other specified congenital malformations of skin: Secondary | ICD-10-CM | POA: Diagnosis not present

## 2023-05-17 DIAGNOSIS — L821 Other seborrheic keratosis: Secondary | ICD-10-CM | POA: Diagnosis not present

## 2023-05-17 DIAGNOSIS — L570 Actinic keratosis: Secondary | ICD-10-CM | POA: Diagnosis not present

## 2023-05-23 ENCOUNTER — Telehealth: Payer: Self-pay | Admitting: Family Medicine

## 2023-05-23 NOTE — Telephone Encounter (Signed)
 Placed in folder at nurse station

## 2023-05-23 NOTE — Telephone Encounter (Signed)
 Type of form received: Surgical Clearance  Additional comments: 20 min appt okay per Archie Patten  Received by: Wilford Sports - Front Desk  Form should be Faxed/mailed to: (address/ fax #) Fax to [phone]  Is patient requesting call for pickup: N/A  Form placed:  Labeled & placed in provider bin  Attach charge sheet.  Provider will determine charge.  Individual made aware of 3-5 business day turn around? N/A

## 2023-05-29 NOTE — Telephone Encounter (Signed)
Appt scheduled 5/5

## 2023-06-05 ENCOUNTER — Encounter: Payer: Self-pay | Admitting: Family Medicine

## 2023-06-06 DIAGNOSIS — M48061 Spinal stenosis, lumbar region without neurogenic claudication: Secondary | ICD-10-CM | POA: Diagnosis not present

## 2023-06-06 DIAGNOSIS — M1611 Unilateral primary osteoarthritis, right hip: Secondary | ICD-10-CM | POA: Diagnosis not present

## 2023-06-06 DIAGNOSIS — M25551 Pain in right hip: Secondary | ICD-10-CM | POA: Diagnosis not present

## 2023-06-07 ENCOUNTER — Other Ambulatory Visit: Payer: Self-pay | Admitting: Family Medicine

## 2023-06-07 MED ORDER — DULOXETINE HCL 20 MG PO CPEP: 20.0000 mg | ORAL_CAPSULE | Freq: Every day | ORAL | 3 refills | Status: DC

## 2023-06-07 NOTE — Telephone Encounter (Signed)
 Copied from CRM [id number]. Topic: Clinical - Medication Refill >> Jun 07, 2023  8:27 AM Godfrey Pick wrote: Most Recent Primary Care Visit:  Provider: Sheliah Hatch  Department: [facility]  Visit Type: PHYSICAL  Date: 04/11/2023  Medication: DULoxetine (CYMBALTA) 20 MG capsule  Has the patient contacted their pharmacy? Yes (Agent: If no, request that the patient contact the pharmacy for the refill. If patient does not wish to contact the pharmacy document the reason why and proceed with request.) (Agent: If yes, when and what did the pharmacy advise?)  Is this the correct pharmacy for this prescription? Yes If no, delete pharmacy and type the correct one.  This is the patient's preferred pharmacy:  [id number] [place], [place] - [address] AT [address] [facility] [address] [place] [address] Phone: [phone] Fax: [phone]   Has the prescription been filled recently? Yes  Is the patient out of the medication? No  Has the patient been seen for an appointment in the last year OR does the patient have an upcoming appointment? Yes  Can we respond through MyChart? Yes  Agent: Please be advised that Rx refills may take up to 3 business days. We ask that you follow-up with your pharmacy.

## 2023-06-09 DIAGNOSIS — M1611 Unilateral primary osteoarthritis, right hip: Secondary | ICD-10-CM | POA: Diagnosis not present

## 2023-06-12 ENCOUNTER — Telehealth: Payer: Self-pay

## 2023-06-12 ENCOUNTER — Encounter: Payer: Self-pay | Admitting: Cardiovascular Disease

## 2023-06-12 NOTE — Telephone Encounter (Signed)
 Type of form received: surgical clearance  Additional comments:   Received by: Tonita Frater -fax  Form should be Faxed/mailed to: [phone]  Is patient requesting call for pickup:  no  Form placed:  in front office folder  Attach charge sheet.  Provider will determine charge.  Individual made aware of 3-5 business day turn around No?

## 2023-06-12 NOTE — Telephone Encounter (Signed)
 Placed in sign folder, appt has been completed

## 2023-06-12 NOTE — H&P (Signed)
 TOTAL HIP ADMISSION H&P  Patient is admitted for right total hip arthroplasty.  Subjective:  Chief Complaint: Right hip pain  HPI: Tracy [name], 73 y.o. female, has a history of pain and functional disability in the right hip due to arthritis and patient has failed non-surgical conservative treatments for greater than 12 weeks to include NSAID's and/or analgesics, flexibility and strengthening excercises, and activity modification. Onset of symptoms was gradual, starting  several  years ago with rapidlly worsening course since that time. The patient noted no past surgery on the right hip. Patient currently rates pain in the right hip at 9 out of 10 with activity. Patient has night pain, worsening of pain with activity and weight bearing, and trendelenberg gait. Patient has evidence of  severe bone-on-bone arthritis with subchondral cystic formation and marginal osteophytes  by imaging studies. This condition presents safety issues increasing the risk of falls. There is no current active infection.  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   OSA (obstructive sleep apnea) 06/22/2020   Amputated toe (HCC) 05/20/2020   Hammer toe 04/27/2020   Dyspnea on exertion 10/10/2018   Overweight (BMI 25.0-29.9) 11/28/2017   S/P shoulder replacement, right 06/23/2017   Shoulder arthritis 03/01/2017   Perforation of sigmoid colon due to diverticulitis 07/05/2016   OA (osteoarthritis) of knee 08/17/2015   Thyroid nodule, 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   Subclinical hyperthyroidism 06/05/2014   Trochanteric bursitis of left hip 04/01/2014   Rosacea 03/26/2014   Allergic rhinitis 02/19/2014   Left hip pain 12/19/2013   Atrophic vaginitis 10/07/2013   Screening for malignant neoplasm of cervix 01/12/2011   Physical exam 01/12/2011   HYPERTRIGLYCERIDEMIA 12/03/2009   GERD  12/03/2009   UTI'S, RECURRENT 12/03/2009   LEG CRAMPS, NOCTURNAL 12/03/2009   DIVERTICULITIS, HX OF 12/03/2009   Depression with anxiety 11/05/2009   Osteoporosis 11/05/2009    Past Medical History:  Diagnosis Date   Allergy    Anxiety    Arthritis    Bursitis of left hip    C. difficile diarrhea    Cancer (HCC)    basil cell   Cataract    bilateral   Complication of anesthesia    Diverticulitis    GERD (gastroesophageal reflux disease)    Hx: UTI (urinary tract infection)    Hyperlipidemia    Hyperthyroidism    Osteopenia    Osteoporosis    PONV (postoperative nausea and vomiting)    Rosacea    Rotator cuff arthropathy, left     Past Surgical History:  Procedure Laterality Date   APPENDECTOMY     BACK SURGERY     CATARACT EXTRACTION, BILATERAL  11/2018   CHOLECYSTECTOMY     FOOT SURGERY Right    X 2   KNEE SURGERY     LAPAROSCOPIC LIVER CYST FENESTRATION     OPEN SURGICAL REPAIR OF GLUTEAL TENDON Left 04/01/2014   Procedure: LEFT HIP BURSECTOMY WITH GLUTEAL TENDON REPAIR;  Surgeon: Loanne Drilling, MD;  Location: [facility];  Service: Orthopedics;  Laterality: Left;   REVERSE SHOULDER ARTHROPLASTY Right 06/23/2017   Procedure: RIGHT REVERSE SHOULDER ARTHROPLASTY;  Surgeon: Beverely Low, MD;  Location: [facility];  Service: Orthopedics;  Laterality: Right;   TONSILLECTOMY     TOTAL KNEE ARTHROPLASTY Right 08/17/2015   Procedure: RIGHT TOTAL KNEE ARTHROPLASTY;  Surgeon: Ollen Gross, MD;  Location: [facility];  Service: Orthopedics;  Laterality: Right;   TUBAL LIGATION      Prior to Admission medications   Medication Sig Start Date End Date Taking? Authorizing Provider  Acetaminophen Extra Strength 500 MG TABS     [provider]  Calcium Carbonate-Vitamin D (CALCIUM 600+D PO) Take 1 tablet by mouth 2 (two) times daily.    [provider]  Cholecalciferol (VITAMIN D) 2000 units tablet Take 2,000 Units by mouth daily.    [provider]  denosumab  (PROLIA) 60 MG/ML SOSY injection     [provider]  DULoxetine (CYMBALTA) 20 MG capsule Take 1 capsule (20 mg total) by mouth daily. 06/07/23   Sheliah Hatch, MD  fenofibrate 160 MG tablet Take 1 tablet (160 mg total) by mouth daily. 01/30/23   Sheliah Hatch, MD  fluticasone [facility]) 50 MCG/ACT nasal spray USE 1 SPRAY IN BOTH  NOSTRILS DAILY 05/16/23   Sheliah Hatch, MD  FLUZONE HIGH-DOSE 0.5 ML injection  11/02/22   [provider]  lidocaine (LIDODERM) 5 % Place 1 patch onto the skin daily. Remove & Discard patch within 12 hours or as directed by MD 01/25/23   Royanne Foots, DO  Melatonin 5 MG TABS Take 5 mg by mouth at bedtime.    [provider]  Multiple Vitamin (MULTIVITAMIN WITH MINERALS) TABS tablet Take 1 tablet by mouth daily.    [provider]  pantoprazole (PROTONIX) 40 MG tablet Take 1 tablet (40 mg total) by mouth 2 (two) times daily. 04/11/23   Sheliah Hatch, MD  polyethylene glycol (MIRALAX / Ethelene Hal) packet Take 17 g by mouth daily. 08/29/16   Waldon Merl, PA-C    Allergies  Allergen Reactions   Amoxicillin-Pot Clavulanate Diarrhea    Severe diarrhea Severe diarrhea Severe diarrhea   Nylon Hives   Demerol [Meperidine] Nausea And Vomiting   Latex Rash, Other (See Comments) and Hives    Reaction:  Blisters    Other Itching    Nylon sheets, pt prefers cotton sheets    Tramadol Nausea And Vomiting    Social History   Socioeconomic History   Marital status: Married    Spouse name: Not on file   Number of children: Not on file   Years of education: Not on file   Highest education level: Associate degree: academic program  Occupational History   Not on file  Tobacco Use   Smoking status: Never   Smokeless tobacco: Never  Vaping Use   Vaping status: Never Used  Substance and Sexual Activity   Alcohol use: No   Drug use: No   Sexual activity: Not Currently  Other Topics Concern   Not on file   Social History Narrative   Not on file   Social Drivers of Health   Financial Resource Strain: Low Risk  (12/29/2022)   Overall Financial Resource Strain (CARDIA)    Difficulty of Paying Living Expenses: Not hard at all  Food Insecurity: No Food Insecurity (12/29/2022)   Hunger Vital Sign    Worried About Running Out of Food in the Last Year: Never true    Ran Out of Food in the Last Year: Never true  Transportation Needs: No Transportation Needs (12/29/2022)   PRAPARE - Administrator, Civil Service (Medical): No    Lack of Transportation (Non-Medical): No  Physical Activity: Inactive (12/29/2022)   Exercise Vital Sign    Days of Exercise per Week: 0 days  Minutes of Exercise per Session: 30 min  Stress: No Stress Concern Present (12/29/2022)   [employer] of Occupational Health - Occupational Stress Questionnaire    Feeling of Stress : Only a little  Social Connections: Socially Integrated (12/29/2022)   Social Connection and Isolation Panel [NHANES]    Frequency of Communication with Friends and Family: More than three times a week    Frequency of Social Gatherings with Friends and Family: Once a week    Attends Religious Services: More than 4 times per year    Active Member of [employer] or Organizations: No    Attends Engineer, structural: More than 4 times per year    Marital Status: Married  Catering manager Violence: Not At Risk (06/29/2022)   Humiliation, Afraid, Rape, and Kick questionnaire    Fear of Current or Ex-Partner: No    Emotionally Abused: No    Physically Abused: No    Sexually Abused: No    Tobacco Use: Low Risk  (04/11/2023)   Patient History    Smoking Tobacco Use: Never    Smokeless Tobacco Use: Never    Passive Exposure: Not on file   Social History   Substance and Sexual Activity  Alcohol Use No    Family History  Problem Relation Age of Onset   Hypertension Mother    Stroke Mother    Liver disease Father         alcohol related   Bone cancer Paternal Grandfather    Cancer Neg Hx    Diabetes Neg Hx    Colon cancer Neg Hx    Esophageal cancer Neg Hx    Pancreatic cancer Neg Hx    Stomach cancer Neg Hx     ROS   Objective:  Physical Exam: Well nourished and well developed.  General: Alert and oriented x3, cooperative and pleasant, no acute distress.  Head: normocephalic, atraumatic, neck supple.  Eyes: EOMI. Abdomen: non-tender to palpation and soft, normoactive bowel sounds. Musculoskeletal: - Right hip can be flexed to approximately 90 degrees with no internal rotation, only approximately 10 degrees of external rotation, and 10 degrees of abduction. There is no trochanteric tenderness  - Significantly antalgic gait pattern on the right. Calves soft and nontender. Motor function intact in LE. Strength 5/5 LE bilaterally. Neuro: Distal pulses 2+. Sensation to light touch intact in LE.  Vital signs in last 24 hours: BP: ()/()  Arterial Line BP: ()/()   Imaging Review Plain radiographs demonstrate severe degenerative joint disease of the right hip. The bone quality appears to be adequate for age and reported activity level.  Assessment/Plan:  End stage arthritis, right hip  The patient history, physical examination, clinical judgement of the provider and imaging studies are consistent with end stage degenerative joint disease of the right hip and total hip arthroplasty is deemed medically necessary. The treatment options including medical management, injection therapy, arthroscopy and arthroplasty were discussed at length. The risks and benefits of total hip arthroplasty were presented and reviewed. The risks due to aseptic loosening, infection, stiffness, dislocation/subluxation, thromboembolic complications and other imponderables were discussed. The patient acknowledged the explanation, agreed to proceed with the plan and consent was signed. Patient is being admitted for inpatient  treatment for surgery, pain control, PT, OT, prophylactic antibiotics, VTE prophylaxis, progressive ambulation and ADLs and discharge planning.The patient is planning to be discharged  home .  Therapy Plans: HEP Disposition: Home with Husband Planned DVT Prophylaxis: Aspirin 81 mg BID DME Needed: None  PCP: Laymon Priest, MD (appt 05/05) TXA: IV Allergies: latex (hives), tramadol (N/V), amoxillin (diarrhea), meperidine (N/V) Anesthesia Concerns: None BMI: 24.6 Last HgbA1c: not diabetic  Pharmacy: [facility] [place], [place])  - Patient was instructed on what medications to stop prior to surgery. - Follow-up visit in 2 weeks with Dr. France Ina - Begin physical therapy following surgery - Pre-operative lab work as pre-surgical testing - Prescriptions will be provided in hospital at time of discharge  R. Brinton Canavan, PA-C Orthopedic Surgery [facility] [place]

## 2023-06-13 ENCOUNTER — Encounter: Payer: Self-pay | Admitting: Physician Assistant

## 2023-06-13 ENCOUNTER — Ambulatory Visit: Attending: Physician Assistant | Admitting: Physician Assistant

## 2023-06-13 VITALS — BP 156/76 | HR 66 | Ht 64.0 in | Wt 160.0 lb

## 2023-06-13 DIAGNOSIS — Z01818 Encounter for other preprocedural examination: Secondary | ICD-10-CM

## 2023-06-13 DIAGNOSIS — R030 Elevated blood-pressure reading, without diagnosis of hypertension: Secondary | ICD-10-CM

## 2023-06-13 NOTE — Progress Notes (Unsigned)
 Cardiology Office Note:  .   Date:  06/14/2023  ID:  Tracy [name], DOB 11-Jan-1951, MRN [id number] PCP: Sheliah Hatch, MD  [facility] Providers Cardiologist:  Nanetta Batty, MD     History of Present Illness: Tracy [name]   [name] is a 73 y.o. female with past medical history of hyperlipidemia.  ABI was normal in the January 2020.  Patient was previously seen by Dr. Allyson Sabal for dyspnea on exertion and she was scheduled to undergo a reverse shoulder replacement at the time.  She underwent echocardiogram and coronary CT.  Echocardiogram obtained on 10/17/2018 showed EF of 60 to 65%, normal RV, no significant valve issue.  Coronary CT obtained on 10/24/2018 showed no significant CAD, coronary calcium score was 0.  Patient presents today for follow-up.  She has both hip and knee issues, she has upcoming hip replacement surgery scheduled with Dr. Lequita Halt.  She denies any recent exertional chest pain or shortness of breath.  Functional ability is mainly limited by orthopedic issues.  She has no lower extremity edema.  On physical [name], she has no heart murmur.  Her lung is clear.  She does not have any carotid bruit.  EKG showed normal sinus rhythm, no acute ischemic changes.  I have reviewed the previous workup, since echocardiogram and coronary CT were both normal and that she does not have a history of CAD and no anginal symptom, she is cleared to proceed with upcoming surgery.  Her blood pressure is elevated initially, on repeat manual blood pressure by myself, blood pressure was 144/86.  When she was seen by Dr. Beverely Low her PCP in February, her blood pressure was 128/74.  She has never required any blood pressure medication in the past and does not have history of hypertension.  I will hold off on adding any antihypertensive medication at this time as patient was in a lot of pain and that that likely has increased her blood pressure.  She can follow-up with cardiology service on a as needed  basis.  ROS:   She denies chest pain, palpitations, dyspnea, pnd, orthopnea, n, v, dizziness, syncope, edema, weight gain, or early satiety. All other systems reviewed and are otherwise negative except as noted above.    Studies Reviewed: Tracy [name]   EKG Interpretation Date/Time:  Tuesday June 13 2023 15:15:28 EDT Ventricular Rate:  66 PR Interval:  174 QRS Duration:  90 QT Interval:  388 QTC Calculation: 406 R Axis:   -3  Text Interpretation: Normal sinus rhythm No significant ischemic ST-T wave changes. Confirmed by Azalee Course [phone]) on 06/13/2023 3:53:10 PM    Cardiac Studies & Procedures   ______________________________________________________________________________________________     ECHOCARDIOGRAM  ECHOCARDIOGRAM COMPLETE 10/17/2018  Narrative ECHOCARDIOGRAM REPORT    Patient Name:   Tracy [name]: 10/17/2018 Medical Rec #:  [id number]          Height:       64.0 in Accession #:    [id number]         Weight:       160.0 lb Date of Birth:  1950-09-16           BSA:          1.78 m Patient Age:    68 years           BP:           127/81 mmHg Patient Gender: F  HR:           65 bpm. [name] Location:  [address]   Procedure: 2D Echo, Color Doppler and Cardiac Doppler  Indications:    Z01.818 Encounter for other preprocedural examination; R06.9 DOE  History:        Patient has no prior history of Echocardiogram examinations. Signs/Symptoms: Dyspnea Risk Factors: Dyslipidemia.  Sonographer:    Irving Burton Senior RDCS Referring Phys: [phone] JONATHAN J BERRY  IMPRESSIONS   1. The left ventricle has normal systolic function with an ejection fraction of 60-65%. The cavity size was normal. Left ventricular diastolic Doppler parameters are consistent with impaired relaxation. No evidence of left ventricular regional wall motion abnormalities. 2. The right ventricle has normal systolic function. The cavity was normal. There is no increase in right  ventricular wall thickness. Right ventricular systolic pressure is normal. 3. The aortic valve is tricuspid. No stenosis of the aortic valve. 4. The aorta is normal unless otherwise noted. 5. When compared to the prior study: No comparison.  FINDINGS Left Ventricle: The left ventricle has normal systolic function, with an ejection fraction of 60-65%. The cavity size was normal. There is no increase in left ventricular wall thickness. Left ventricular diastolic Doppler parameters are consistent with impaired relaxation. No evidence of left ventricular regional wall motion abnormalities..  Right Ventricle: The right ventricle has normal systolic function. The cavity was normal. There is no increase in right ventricular wall thickness. Right ventricular systolic pressure is normal.  Left Atrium: Left atrial size was normal in size.  Right Atrium: Right atrial size was normal in size.  Interatrial Septum: No atrial level shunt detected by color flow Doppler.  Pericardium: There is no evidence of pericardial effusion.  Mitral Valve: The mitral valve is normal in structure. Mitral valve regurgitation is not visualized by color flow Doppler.  Tricuspid Valve: The tricuspid valve is normal in structure. Tricuspid valve regurgitation was not visualized by color flow Doppler.  Aortic Valve: The aortic valve is tricuspid Aortic valve regurgitation was not visualized by color flow Doppler. There is No stenosis of the aortic valve.  Pulmonic Valve: The pulmonic valve was grossly normal. Pulmonic valve regurgitation is not visualized by color flow Doppler.  Aorta: The aorta is normal unless otherwise noted.  Venous: The inferior vena cava is normal in size with greater than 50% respiratory variability.  Compared to previous [name]: No comparison.   +--------------+--------++ LEFT VENTRICLE         +----------------+---------++ +--------------+--------++ Diastology                PLAX 2D                 +----------------+---------++ +--------------+--------++ LV e' lateral:  7.72 cm/s LVIDd:        2.96 cm  +----------------+---------++ +--------------+--------++ LV E/e' lateral:6.0       LVIDs:        2.06 cm  +----------------+---------++ +--------------+--------++ LV e' medial:   5.55 cm/s LV PW:        1.25 cm  +----------------+---------++ +--------------+--------++ LV E/e' medial: 8.4       LV IVS:       1.61 cm  +----------------+---------++ +--------------+--------++ LVOT diam:    1.90 cm  +--------------+--------++ LV SV:        20 ml    +--------------+--------++ LV SV Index:  11.02    +--------------+--------++ LVOT Area:    2.84 cm +--------------+--------++                        +--------------+--------++  +---------------+----------++  RIGHT VENTRICLE           +---------------+----------++ RV S prime:    16.30 cm/s +---------------+----------++ TAPSE (M-mode):2.9 cm     +---------------+----------++ RVSP:          21.3 mmHg  +---------------+----------++  +---------------+-------++-----------++ LEFT ATRIUM           Index       +---------------+-------++-----------++ LA diam:       2.60 cm1.46 cm/m  +---------------+-------++-----------++ LA Vol (A2C):  41.0 ml23.04 ml/m +---------------+-------++-----------++ LA Vol (A4C):  33.9 ml19.05 ml/m +---------------+-------++-----------++ LA Biplane Vol:37.6 ml21.13 ml/m +---------------+-------++-----------++ +------------+---------++-----------++ RIGHT ATRIUM         Index       +------------+---------++-----------++ RA Pressure:3.00 mmHg            +------------+---------++-----------++ RA Area:    12.50 cm            +------------+---------++-----------++ RA Volume:  26.50 ml 14.89  ml/m +------------+---------++-----------++ +------------+-----------++ AORTIC VALVE            +------------+-----------++ LVOT Vmax:  87.90 cm/s  +------------+-----------++ LVOT Vmean: 62.600 cm/s +------------+-----------++ LVOT VTI:   0.202 m     +------------+-----------++  +-------------+-------++ AORTA                +-------------+-------++ Ao Root diam:2.80 cm +-------------+-------++ Ao Asc diam: 3.00 cm +-------------+-------++  +--------------+----------++ +---------------+-----------++ MITRAL VALVE             TRICUSPID VALVE            +--------------+----------++ +---------------+-----------++ MV Area (PHT):2.83 cm   TR Peak grad:  18.3 mmHg   +--------------+----------++ +---------------+-----------++ MV PHT:       77.72 msec TR Vmax:       214.00 cm/s +--------------+----------++ +---------------+-----------++ MV Decel Time:268 msec   Estimated RAP: 3.00 mmHg   +--------------+----------++ +---------------+-----------++ +--------------+----------++ RVSP:          21.3 mmHg   MV E velocity:46.60 cm/s +---------------+-----------++ +--------------+----------++ MV A velocity:74.20 cm/s +--------------+-------+ +--------------+----------++ SHUNTS                MV E/A ratio: 0.63       +--------------+-------+ +--------------+----------++ Systemic VTI: 0.20 m  +--------------+-------+ Systemic Diam:1.90 cm +--------------+-------+   Jackquelyn Mass MD Electronically signed by Jackquelyn Mass MD Signature Date/Time: 10/17/2018/1:14:21 PM    Final      CT SCANS  CT CORONARY MORPH W/CTA COR W/SCORE 10/24/2018  Addendum 10/24/2018  4:13 PM ADDENDUM REPORT: 10/24/2018 16:11  CLINICAL DATA:  27F with hyperlipidemia and exertional dyspnea.  [name]: Cardiac/Coronary  CT  TECHNIQUE: The patient was scanned on a [employer].  FINDINGS: A 120 kV prospective  scan was triggered in the descending thoracic aorta at 111 HU's. Axial non-contrast 3 mm slices were carried out through the heart. The data set was analyzed on a dedicated work station and scored using the Agatson method. Gantry rotation speed was 250 msecs and collimation was .6 mm. No beta blockade and 0.8 mg of sl NTG was given. The 3D data set was reconstructed in 5% intervals of the 67-82 % of the R-R cycle. Diastolic phases were analyzed on a dedicated work station using MPR, MIP and VRT modes. The patient received 80 cc of contrast.  Aorta: Normal size. 3.1 cm. Minimal calcification of the aortic annulus. No dissection.  Aortic Valve:  Trileaflet.  No calcifications.  Coronary Arteries:  Normal coronary origin.  Right dominance.  RCA is a large dominant artery that gives rise to PDA  and 3 PLV branches. There is minimal (<25%) calcified plaque in the mid RCA.  Left main is a large artery that gives rise to LAD and LCX arteries.  LAD is a large vessel that has no plaque.  LCX is a non-dominant artery that gives rise to one large OM1 branch. There is no plaque.  Other findings:  Normal pulmonary vein drainage into the left atrium.  Normal let atrial appendage without a thrombus.  Normal size of the pulmonary artery.  IMPRESSION: 1. Coronary calcium score of 0. This was 0 percentile for age and sex matched control.  2. Normal coronary origin with right dominance.  3. No evidence of significant CAD.  Maudine Sos, MD   Electronically Signed By: Maudine Sos On: 10/24/2018 16:11  Narrative [name]: OVER-READ INTERPRETATION  CT CHEST  The following report is an over-read performed by radiologist Dr. Erica Hau of [facility] on 10/24/2018. This over-read does not include interpretation of cardiac or coronary anatomy or pathology. The coronary CTA interpretation by the cardiologist is attached.  COMPARISON:   None.  FINDINGS: Vascular: No incidental findings.  Mediastinum/Nodes: The visualized mediastinum and hilar regions show no lymphadenopathy or masses. There is a small hiatal hernia.  Lungs/Pleura: Visualized lungs show no evidence of pulmonary edema, consolidation, pneumothorax, nodule or pleural fluid.  Upper Abdomen: No acute abnormality.  Musculoskeletal: No chest wall mass or suspicious bone lesions identified.  IMPRESSION: No significant incidental findings.  Small hiatal hernia present.  Electronically Signed: By: Erica Hau M.D. On: 10/24/2018 13:50     ______________________________________________________________________________________________      Risk Assessment/Calculations:            Physical [name]:   VS:  BP (!) 156/76 (BP Location: Left Arm, Patient Position: Sitting, Cuff Size: Normal)   Pulse 66   Ht 5\' 4"  (1.626 m)   Wt 160 lb (72.6 kg)   SpO2 92%   BMI 27.46 kg/m    Wt Readings from Last 3 Encounters:  06/13/23 160 lb (72.6 kg)  04/11/23 157 lb (71.2 kg)  01/25/23 150 lb (68 kg)    GEN: Well nourished, well developed in no acute distress NECK: No JVD; No carotid bruits CARDIAC: RRR, no murmurs, rubs, gallops RESPIRATORY:  Clear to auscultation without rales, wheezing or rhonchi  ABDOMEN: Soft, non-tender, non-distended EXTREMITIES:  No edema; No deformity   ASSESSMENT AND PLAN: .    Preoperative clearance: Upcoming hip surgery by Dr. France Ina.  Patient denies any recent chest pain.  Previous coronary calcium score in 2020 was 0.  She has no prior cardiac history.  Her EKG is normal.  She is at acceptable risk to proceed with upcoming surgery without further workup.  I have given the patient a cardiac clearance letter for her to take to Dr. Spurgeon Dyer office.  Elevated blood pressure, her blood pressure is elevated today, however previously her blood pressure has always been normal.  She is in a lot of pain due to hip issue.  I will hold  off on adding any blood pressure medication.       Dispo: Follow-up as needed  Signed, Freddi Forster, PA

## 2023-06-13 NOTE — Patient Instructions (Signed)
 Medication Instructions:  NO CHANGES *If you need a refill on your cardiac medications before your next appointment, please call your pharmacy*  Lab Work: NO LABS If you have labs (blood work) drawn today and your tests are completely normal, you will receive your results only by: MyChart Message (if you have MyChart) OR A paper copy in the mail If you have any lab test that is abnormal or we need to change your treatment, we will call you to review the results.  Testing/Procedures: NO TESTING  Follow-Up: At [facility], you and your health needs are our priority.  As part of our continuing mission to provide you with exceptional heart care, our providers are all part of one team.  This team includes your primary Cardiologist (physician) and Advanced Practice Providers or APPs (Physician Assistants and Nurse Practitioners) who all work together to provide you with the care you need, when you need it.  Your next appointment:   FOLLOW UP AS NEEDED   Provider:   Lauro Portal, MD  Other Instructions You have been cleared for your procedure, Ervin Heath, PA will type up clearance and fax to surgeon       1st Floor: - Lobby - Registration  - Pharmacy  - Lab - Cafe  2nd Floor: - PV Lab - Diagnostic Testing (echo, CT, nuclear med)  3rd Floor: - Vacant  4th Floor: - TCTS (cardiothoracic surgery) - AFib Clinic - Structural Heart Clinic - Vascular Surgery  - Vascular Ultrasound  5th Floor: - HeartCare Cardiology (general and EP) - Clinical Pharmacy for coumadin, hypertension, lipid, weight-loss medications, and med management appointments    Valet parking services will be available as well.

## 2023-06-16 NOTE — Telephone Encounter (Signed)
 Patient was just seen by Ervin Heath, PA.  Clearance provided.

## 2023-06-18 ENCOUNTER — Encounter: Payer: Self-pay | Admitting: Family Medicine

## 2023-06-20 ENCOUNTER — Other Ambulatory Visit: Payer: Self-pay | Admitting: Family Medicine

## 2023-06-26 NOTE — Patient Instructions (Signed)
 DUE TO COVID-19 ONLY TWO VISITORS  (aged 75 and older)  ARE ALLOWED TO COME WITH YOU AND STAY IN THE WAITING ROOM ONLY DURING PRE OP AND PROCEDURE.   **NO VISITORS ARE ALLOWED IN THE SHORT STAY AREA OR RECOVERY ROOM!!**  IF YOU WILL BE ADMITTED INTO THE HOSPITAL YOU ARE ALLOWED ONLY FOUR SUPPORT PEOPLE DURING VISITATION HOURS ONLY (7 AM -8PM)   The support person(s) must pass our screening, gel in and out, and wear a mask at all times, including in the patient's room. Patients must also wear a mask when staff or their support person are in the room. Visitors GUEST BADGE MUST BE WORN VISIBLY  One adult visitor may remain with you overnight and MUST be in the room by 8 P.M.     Your procedure is scheduled on: 07/10/23   Report to [facility] Main Entrance    Report to admitting at : 11:20 AM   Call this number if you have problems the morning of surgery [phone]   Do not eat food :After Midnight.   After Midnight you may have the following liquids until : 10:50 AM DAY OF SURGERY  Water Black Coffee (sugar ok, NO MILK/CREAM OR CREAMERS)  Tea (sugar ok, NO MILK/CREAM OR CREAMERS) regular and decaf                             Plain Jell-O (NO RED)                                           Fruit ices (not with fruit pulp, NO RED)                                     Popsicles (NO RED)                                                                  Juice: apple, WHITE grape, WHITE cranberry Sports drinks like Gatorade (NO RED)   The day of surgery:  Drink ONE (1) Pre-Surgery Clear Ensure at : 10:50 AM the morning of surgery. Drink in one sitting. Do not sip.  This drink was given to you during your hospital  pre-op appointment visit. Nothing else to drink after completing the  Pre-Surgery Clear Ensure or G2.          If you have questions, please contact your surgeon's office.  FOLLOW ANY ADDITIONAL PRE OP INSTRUCTIONS YOU RECEIVED FROM YOUR SURGEON'S OFFICE!!!   Oral  Hygiene is also important to reduce your risk of infection.                                    Remember - BRUSH YOUR TEETH THE MORNING OF SURGERY WITH YOUR REGULAR TOOTHPASTE  DENTURES WILL BE REMOVED PRIOR TO SURGERY PLEASE DO NOT APPLY "Poly grip" OR ADHESIVES!!!   Do NOT smoke after Midnight   Take these medicines the morning of surgery  with A SIP OF WATER:duloxetine ,pantoprazole .Loratadine  as needed.Use Flonase  and eye drops as usual.   STOP TAKING all Vitamins, Herbs and supplements 1 week before your surgery.                               You may not have any metal on your body including hair pins, jewelry, and body piercing             Do not wear make-up, lotions, powders, perfumes/cologne, or deodorant  Do not wear nail polish including gel and S&S, artificial/acrylic nails, or any other type of covering on natural nails including finger and toenails. If you have artificial nails, gel coating, etc. that needs to be removed by a nail salon please have this removed prior to surgery or surgery may need to be canceled/ delayed if the surgeon/ anesthesia feels like they are unable to be safely monitored.   Do not shave  48 hours prior to surgery.    Do not bring valuables to the hospital. [facility] IS NOT             RESPONSIBLE   FOR VALUABLES.   Contacts, glasses, or bridgework may not be worn into surgery.   Bring small overnight bag day of surgery.   DO NOT BRING YOUR HOME MEDICATIONS TO THE HOSPITAL. PHARMACY WILL DISPENSE MEDICATIONS LISTED ON YOUR MEDICATION LIST TO YOU DURING YOUR ADMISSION IN THE HOSPITAL!    Patients discharged on the day of surgery will not be allowed to drive home.  Someone NEEDS to stay with you for the first 24 hours after anesthesia.   Special Instructions: Bring a copy of your healthcare power of attorney and living will documents         the day of surgery if you haven't scanned them before.              Please read over the following fact  sheets you were given: IF YOU HAVE QUESTIONS ABOUT YOUR PRE-OP INSTRUCTIONS PLEASE CALL [phone]      Pre-operative 5 CHG Bath Instructions   You can play a key role in reducing the risk of infection after surgery. Your skin needs to be as free of germs as possible. You can reduce the number of germs on your skin by washing with CHG (chlorhexidine  gluconate) soap before surgery. CHG is an antiseptic soap that kills germs and continues to kill germs even after washing.   DO NOT use if you have an allergy to chlorhexidine /CHG or antibacterial soaps. If your skin becomes reddened or irritated, stop using the CHG and notify one of our RNs at [phone].   Please shower with the CHG soap starting 4 days before surgery using the following schedule:     Please keep in mind the following:  DO NOT shave, including legs and underarms, starting the day of your first shower.   You may shave your face at any point before/day of surgery.  Place clean sheets on your bed the day you start using CHG soap. Use a clean washcloth (not used since being washed) for each shower. DO NOT sleep with pets once you start using the CHG.   CHG Shower Instructions:  If you choose to wash your hair and private area, wash first with your normal shampoo/soap.  After you use shampoo/soap, rinse your hair and body thoroughly to remove shampoo/soap residue.  Turn the water OFF and apply about 3  tablespoons (45 ml) of CHG soap to a CLEAN washcloth.  Apply CHG soap ONLY FROM YOUR NECK DOWN TO YOUR TOES (washing for 3-5 minutes)  DO NOT use CHG soap on face, private areas, open wounds, or sores.  Pay special attention to the area where your surgery is being performed.  If you are having back surgery, having someone wash your back for you may be helpful. Wait 2 minutes after CHG soap is applied, then you may rinse off the CHG soap.  Pat dry with a clean towel  Put on clean clothes/pajamas   If you choose to wear  lotion, please use ONLY the CHG-compatible lotions on the back of this paper.     Additional instructions for the day of surgery: DO NOT APPLY any lotions, deodorants, cologne, or perfumes.   Put on clean/comfortable clothes.  Brush your teeth.  Ask your nurse before applying any prescription medications to the skin.   CHG Compatible Lotions   Aveeno Moisturizing lotion  Cetaphil Moisturizing Cream  Cetaphil Moisturizing Lotion  Clairol Herbal Essence Moisturizing Lotion, Dry Skin  Clairol Herbal Essence Moisturizing Lotion, Extra Dry Skin  Clairol Herbal Essence Moisturizing Lotion, Normal Skin  Curel Age Defying Therapeutic Moisturizing Lotion with Alpha Hydroxy  Curel Extreme Care Body Lotion  Curel Soothing Hands Moisturizing Hand Lotion  Curel Therapeutic Moisturizing Cream, Fragrance-Free  Curel Therapeutic Moisturizing Lotion, Fragrance-Free  Curel Therapeutic Moisturizing Lotion, Original Formula  Eucerin Daily Replenishing Lotion  Eucerin Dry Skin Therapy Plus Alpha Hydroxy Crme  Eucerin Dry Skin Therapy Plus Alpha Hydroxy Lotion  Eucerin Original Crme  Eucerin Original Lotion  Eucerin Plus Crme Eucerin Plus Lotion  Eucerin TriLipid Replenishing Lotion  Keri Anti-Bacterial Hand Lotion  Keri Deep Conditioning Original Lotion Dry Skin Formula Softly Scented  Keri Deep Conditioning Original Lotion, Fragrance Free Sensitive Skin Formula  Keri Lotion Fast Absorbing Fragrance Free Sensitive Skin Formula  Keri Lotion Fast Absorbing Softly Scented Dry Skin Formula  Keri Original Lotion  Keri Skin Renewal Lotion Keri Silky Smooth Lotion  Keri Silky Smooth Sensitive Skin Lotion  Nivea Body Creamy Conditioning Oil  Nivea Body Extra Enriched Lotion  Nivea Body Original Lotion  Nivea Body Sheer Moisturizing Lotion Nivea Crme  Nivea Skin Firming Lotion  NutraDerm 30 Skin Lotion  NutraDerm Skin Lotion  NutraDerm Therapeutic Skin Cream  NutraDerm Therapeutic Skin Lotion   ProShield Protective Hand Cream  Provon moisturizing lotion   Incentive Spirometer  An incentive spirometer is a tool that can help keep your lungs clear and active. This tool measures how well you are filling your lungs with each breath. Taking long deep breaths may help reverse or decrease the chance of developing breathing (pulmonary) problems (especially infection) following: A long period of time when you are unable to move or be active. BEFORE THE PROCEDURE  If the spirometer includes an indicator to show your best effort, your nurse or respiratory therapist will set it to a desired goal. If possible, sit up straight or lean slightly forward. Try not to slouch. Hold the incentive spirometer in an upright position. INSTRUCTIONS FOR USE  Sit on the edge of your bed if possible, or sit up as far as you can in bed or on a chair. Hold the incentive spirometer in an upright position. Breathe out normally. Place the mouthpiece in your mouth and seal your lips tightly around it. Breathe in slowly and as deeply as possible, raising the piston or the ball toward the top of the column.  Hold your breath for 3-5 seconds or for as long as possible. Allow the piston or ball to fall to the bottom of the column. Remove the mouthpiece from your mouth and breathe out normally. Rest for a few seconds and repeat Steps 1 through 7 at least 10 times every 1-2 hours when you are awake. Take your time and take a few normal breaths between deep breaths. The spirometer may include an indicator to show your best effort. Use the indicator as a goal to work toward during each repetition. After each set of 10 deep breaths, practice coughing to be sure your lungs are clear. If you have an incision (the cut made at the time of surgery), support your incision when coughing by placing a pillow or rolled up towels firmly against it. Once you are able to get out of bed, walk around indoors and cough well. You may stop using  the incentive spirometer when instructed by your caregiver.  RISKS AND COMPLICATIONS Take your time so you do not get dizzy or light-headed. If you are in pain, you may need to take or ask for pain medication before doing incentive spirometry. It is harder to take a deep breath if you are having pain. AFTER USE Rest and breathe slowly and easily. It can be helpful to keep track of a log of your progress. Your caregiver can provide you with a simple table to help with this. If you are using the spirometer at home, follow these instructions: SEEK MEDICAL CARE IF:  You are having difficultly using the spirometer. You have trouble using the spirometer as often as instructed. Your pain medication is not giving enough relief while using the spirometer. You develop fever of 100.5 F (38.1 C) or higher. SEEK IMMEDIATE MEDICAL CARE IF:  You cough up bloody sputum that had not been present before. You develop fever of 102 F (38.9 C) or greater. You develop worsening pain at or near the incision site. MAKE SURE YOU:  Understand these instructions. Will watch your condition. Will get help right away if you are not doing well or get worse. Document Released: 06/27/2006 Document Revised: 05/09/2011 Document Reviewed: 08/28/2006 [facility] Patient Information 2014 [place], [place].   ________________________________________________________________________

## 2023-06-27 ENCOUNTER — Encounter (HOSPITAL_COMMUNITY)
Admission: RE | Admit: 2023-06-27 | Discharge: 2023-06-27 | Disposition: A | Discharge status: Home / Self Care | Source: Ambulatory Visit | Attending: Orthopedic Surgery | Admitting: Orthopedic Surgery

## 2023-06-27 ENCOUNTER — Other Ambulatory Visit: Payer: Self-pay

## 2023-06-27 ENCOUNTER — Encounter (HOSPITAL_COMMUNITY): Payer: Self-pay

## 2023-06-27 VITALS — BP 144/80 | HR 68 | Temp 97.6°F | Resp 18 | Ht 64.0 in | Wt 156.5 lb

## 2023-06-27 DIAGNOSIS — F32A Depression, unspecified: Secondary | ICD-10-CM | POA: Diagnosis not present

## 2023-06-27 DIAGNOSIS — Z01818 Encounter for other preprocedural examination: Secondary | ICD-10-CM

## 2023-06-27 DIAGNOSIS — K219 Gastro-esophageal reflux disease without esophagitis: Secondary | ICD-10-CM | POA: Diagnosis not present

## 2023-06-27 DIAGNOSIS — M1611 Unilateral primary osteoarthritis, right hip: Secondary | ICD-10-CM | POA: Insufficient documentation

## 2023-06-27 DIAGNOSIS — E0590 Thyrotoxicosis, unspecified without thyrotoxic crisis or storm: Secondary | ICD-10-CM | POA: Insufficient documentation

## 2023-06-27 DIAGNOSIS — Z01812 Encounter for preprocedural laboratory examination: Secondary | ICD-10-CM | POA: Diagnosis not present

## 2023-06-27 HISTORY — DX: Depression, unspecified: F32.A

## 2023-06-27 HISTORY — DX: Atherosclerotic heart disease of native coronary artery without angina pectoris: I25.10

## 2023-06-27 LAB — TYPE AND SCREEN
ABO/RH(D): A POS
Antibody Screen: NEGATIVE

## 2023-06-27 LAB — BASIC METABOLIC PANEL WITH GFR
Anion gap: 7 (ref 5–15)
BUN: 31 mg/dL — ABNORMAL HIGH (ref 8–23)
CO2: 30 mmol/L (ref 22–32)
Calcium: 10.4 mg/dL — ABNORMAL HIGH (ref 8.9–10.3)
Chloride: 105 mmol/L (ref 98–111)
Creatinine, Ser: 0.89 mg/dL (ref 0.44–1.00)
GFR, Estimated: >60 mL/min (ref >60)
Glucose, Bld: 87 mg/dL (ref 70–99)
Potassium: 3.7 mmol/L (ref 3.5–5.1)
Sodium: 142 mmol/L (ref 135–145)

## 2023-06-27 LAB — SURGICAL PCR SCREEN
MRSA, PCR: NEGATIVE
Staphylococcus aureus: POSITIVE — AB

## 2023-06-27 LAB — CBC
HCT: 39.6 % (ref 36.0–46.0)
Hemoglobin: 12.4 g/dL (ref 12.0–15.0)
MCH: 29.9 pg (ref 26.0–34.0)
MCHC: 31.3 g/dL (ref 30.0–36.0)
MCV: 95.4 fL (ref 80.0–100.0)
Platelets: 233 10*3/uL (ref 150–400)
RBC: 4.15 MIL/uL (ref 3.87–5.11)
RDW: 12.4 % (ref 11.5–15.5)
WBC: 4.8 10*3/uL (ref 4.0–10.5)
nRBC: 0 % (ref 0.0–0.2)

## 2023-06-27 NOTE — Progress Notes (Signed)
 For Anesthesia: PCP - Jess Morita, MD  Cardiologist - Avanell Leigh, MD  Clearance: Ervin Heath: PA: 06/13/23 Bowel Prep reminder:  Chest x-ray -  EKG - 06/13/23 Stress Test -  ECHO - 10/17/18 Cardiac Cath -  Pacemaker/ICD device last checked: Pacemaker orders received: Device Rep notified:  Spinal Cord Stimulator:N/A  Sleep Study - Yes CPAP - Yes  Fasting Blood Sugar - N/A Checks Blood Sugar _____ times a day Date and result of last Hgb A1c-  Last dose of GLP1 agonist- N/A GLP1 instructions:   Last dose of SGLT-2 inhibitors- N/A SGLT-2 instructions:   Blood Thinner Instructions:N/A Aspirin  Instructions: Last Dose:  Activity level: Can go up a flight of stairs and activities of daily living without stopping and without chest pain and/or shortness of breath      Unable to go up a flight of stairs shortness of breath    Anesthesia review: Hx: CAD,OSA(CPAP).  Patient denies shortness of breath, fever, cough and chest pain at PAT appointment   Patient verbalized understanding of instructions that were given to them at the PAT appointment. Patient was also instructed that they will need to review over the PAT instructions again at home before surgery.

## 2023-06-27 NOTE — Progress Notes (Signed)
PCR: + STAPH °

## 2023-06-28 ENCOUNTER — Encounter (HOSPITAL_COMMUNITY): Payer: Self-pay

## 2023-06-28 NOTE — Anesthesia Preprocedure Evaluation (Addendum)
 Anesthesia Evaluation  Patient identified by MRN, date of birth, ID band Patient awake    Reviewed: Allergy & Precautions, NPO status , Patient's Chart, lab work & pertinent test results  History of Anesthesia Complications (+) PONV and history of anesthetic complications  Airway Mallampati: I  TM Distance: >3 FB Neck ROM: Full    Dental  (+) Dental Advisory Given, Teeth Intact   Pulmonary sleep apnea    breath sounds clear to auscultation       Cardiovascular negative cardio ROS  Rhythm:Regular Rate:Normal     Neuro/Psych  PSYCHIATRIC DISORDERS Anxiety Depression    negative neurological ROS     GI/Hepatic Neg liver ROS,GERD  Medicated,,  Endo/Other   Hyperthyroidism   Renal/GU negative Renal ROS     Musculoskeletal  (+) Arthritis ,    Abdominal   Peds  Hematology negative hematology ROS (+)   Anesthesia Other Findings   Reproductive/Obstetrics                             Anesthesia Physical Anesthesia Plan  ASA: 2  Anesthesia Plan: Spinal   Post-op Pain Management:    Induction: Intravenous  PONV Risk Score and Plan: 4 or greater and Dexamethasone , Ondansetron , Propofol  infusion and Treatment may vary due to age or medical condition  Airway Management Planned: Natural Airway and Nasal Cannula  Additional Equipment: None  Intra-op Plan:   Post-operative Plan:   Informed Consent: I have reviewed the patients History and Physical, chart, labs and discussed the procedure including the risks, benefits and alternatives for the proposed anesthesia with the patient or authorized representative who has indicated his/her understanding and acceptance.       Plan Discussed with: CRNA  Anesthesia Plan Comments: (See PAT note from 4/29  Lab Results      Component                Value               Date                      WBC                      5.6                  07/31/2023                HGB                      13.1                07/31/2023                HCT                      39.3                07/31/2023                MCV                      89.0                07/31/2023                PLT  244.0               07/31/2023           )        Anesthesia Quick Evaluation

## 2023-06-28 NOTE — Progress Notes (Signed)
 Case: [id number] Date/Time: 07/10/23 1335   Procedure: ARTHROPLASTY, HIP, TOTAL, ANTERIOR APPROACH (Right: Hip)   Anesthesia type: Choice   Pre-op diagnosis: right hip osteoarthritis   [facility] ROOM 09 / [facility]   Surgeons: Liliane Rei, MD       DISCUSSION: Tracy [name] is a 73 yo female who presents to PAT prior to surgery above. PMH of GERD, hyperthyroid, arthritis, depression.  Prior anesthesia complications include PONV  Patient had pre op cardiac eval on 06/13/23. She has no cardiac hx. She has had previous w/u for DOE including a stress test and echo which were grossly normal. She was cleared for surgery:  "Preoperative clearance: Upcoming hip surgery by Dr. France Ina.  Patient denies any recent chest pain.  Previous coronary calcium  score in 2020 was 0.  She has no prior cardiac history.  Her EKG is normal.  She is at acceptable risk to proceed with upcoming surgery without further workup.  I have given the patient a cardiac clearance letter for her to take to Dr. Spurgeon Dyer office."  VS: BP (!) 144/80   Pulse 68   Temp 36.4 C (Oral)   Resp 18   Ht 5\' 4"  (1.626 m)   Wt 71 kg   SpO2 98%   BMI 26.86 kg/m   PROVIDERS: Jess Morita, MD   LABS: Labs reviewed: Acceptable for surgery. (all labs ordered are listed, but only abnormal results are displayed)  Labs Reviewed  SURGICAL PCR SCREEN - Abnormal; Notable for the following components:      Result Value   Staphylococcus aureus POSITIVE (*)    All other components within normal limits  BASIC METABOLIC PANEL WITH GFR - Abnormal; Notable for the following components:   BUN 31 (*)    Calcium  10.4 (*)    All other components within normal limits  CBC  TYPE AND SCREEN     IMAGES:   EKG 06/13/23:  Normal sinus rhythm, rate 66 No significant ischemic ST-T wave changes  CV:  CT coronary 10/24/2018:  IMPRESSION: 1. Coronary calcium  score of 0. This was 0 percentile for age and sex matched control.    2. Normal coronary origin with right dominance.   3. No evidence of significant CAD.  Echo 10/17/2018:  IMPRESSIONS     1. The left ventricle has normal systolic function with an ejection  fraction of 60-65%. The cavity size was normal. Left ventricular diastolic  Doppler parameters are consistent with impaired relaxation. No evidence of  left ventricular regional wall  motion abnormalities.   2. The right ventricle has normal systolic function. The cavity was  normal. There is no increase in right ventricular wall thickness. Right  ventricular systolic pressure is normal.   3. The aortic valve is tricuspid. No stenosis of the aortic valve.   4. The aorta is normal unless otherwise noted.   5. When compared to the prior study: No comparison.   Past Medical History:  Diagnosis Date   Allergy    Anxiety    Arthritis    Bursitis of left hip    C. difficile diarrhea    Cancer (HCC)    basil cell   Cataract    bilateral   Complication of anesthesia    Depression    Diverticulitis    GERD (gastroesophageal reflux disease)    Hx: UTI (urinary tract infection)    Hyperlipidemia    Hyperthyroidism    Osteopenia    Osteoporosis    PONV (postoperative  nausea and vomiting)    Rosacea    Rotator cuff arthropathy, left     Past Surgical History:  Procedure Laterality Date   APPENDECTOMY     BACK SURGERY     CATARACT EXTRACTION, BILATERAL  11/2018   CHOLECYSTECTOMY     COLONOSCOPY     FOOT SURGERY Right    X 2   KNEE SURGERY     LAPAROSCOPIC LIVER CYST FENESTRATION     OPEN SURGICAL REPAIR OF GLUTEAL TENDON Left 04/01/2014   Procedure: LEFT HIP BURSECTOMY WITH GLUTEAL TENDON REPAIR;  Surgeon: Aurther Blue, MD;  Location: [facility];  Service: Orthopedics;  Laterality: Left;   REVERSE SHOULDER ARTHROPLASTY Right 06/23/2017   Procedure: RIGHT REVERSE SHOULDER ARTHROPLASTY;  Surgeon: Winston Hawking, MD;  Location: [facility] OR;  Service: Orthopedics;  Laterality: Right;    TONSILLECTOMY     TOTAL KNEE ARTHROPLASTY Right 08/17/2015   Procedure: RIGHT TOTAL KNEE ARTHROPLASTY;  Surgeon: Liliane Rei, MD;  Location[facility];  Service: Orthopedics;  Laterality: Right;   TUBAL LIGATION      MEDICATIONS:  amoxicillin  (AMOXIL ) 500 MG capsule   Calcium  Carbonate-Vitamin D  (CALCIUM  600+D PO)   Cholecalciferol  (VITAMIN D ) 2000 units tablet   denosumab  (PROLIA ) 60 MG/ML SOSY injection   DULoxetine  (CYMBALTA ) 20 MG capsule   fenofibrate  160 MG tablet   fluticasone  (FLONASE ) 50 MCG/ACT nasal spray   ibuprofen  (ADVIL ) 200 MG tablet   loratadine  (CLARITIN ) 10 MG tablet   Melatonin 5 MG TABS   Multiple Vitamin (MULTIVITAMIN WITH MINERALS) TABS tablet   pantoprazole  (PROTONIX ) 40 MG tablet   polyethylene glycol (MIRALAX  / GLYCOLAX ) packet   Propylene Glycol, PF, (SYSTANE COMPLETE PF) 0.6 % SOLN   No current facility-administered medications for this encounter.   [name] [facility] Short Stay/Anesthesiology [facility] Phone [phone] 06/28/2023 2:46 PM

## 2023-07-03 ENCOUNTER — Ambulatory Visit: Payer: Medicare Other | Admitting: Family Medicine

## 2023-07-07 ENCOUNTER — Encounter: Payer: Self-pay | Admitting: Family Medicine

## 2023-07-07 ENCOUNTER — Ambulatory Visit (INDEPENDENT_AMBULATORY_CARE_PROVIDER_SITE_OTHER): Admitting: Family Medicine

## 2023-07-07 VITALS — BP 110/64 | HR 87 | Temp 98.7°F | Ht 64.0 in | Wt 157.4 lb

## 2023-07-07 DIAGNOSIS — R051 Acute cough: Secondary | ICD-10-CM

## 2023-07-07 DIAGNOSIS — U071 COVID-19: Secondary | ICD-10-CM | POA: Diagnosis not present

## 2023-07-07 LAB — POC INFLUENZA A&B (BINAX/QUICKVUE)
Influenza A, POC: NEGATIVE
Influenza B, POC: NEGATIVE

## 2023-07-07 LAB — POC COVID19 BINAXNOW: SARS Coronavirus 2 Ag: POSITIVE — AB

## 2023-07-07 MED ORDER — NIRMATRELVIR/RITONAVIR (PAXLOVID)TABLET: 3.0000 | ORAL_TABLET | Freq: Two times a day (BID) | ORAL | 0 refills | Status: AC

## 2023-07-07 MED ORDER — BENZONATATE 200 MG PO CAPS: 200.0000 mg | ORAL_CAPSULE | Freq: Three times a day (TID) | ORAL | 0 refills | Status: DC | PRN

## 2023-07-07 MED ORDER — PROMETHAZINE-DM 6.25-15 MG/5ML PO SYRP: 5.0000 mL | ORAL_SOLUTION | Freq: Four times a day (QID) | ORAL | 0 refills | Status: DC | PRN

## 2023-07-07 NOTE — Patient Instructions (Signed)
 Follow up as needed or as scheduled START the Paxlovid as directed Drink LOTS of fluids REST! Tylenol  as needed for headache, body aches, fever USE the cough medications as needed Let Ortho know you have been diagnosed with COVID so they can decide the next steps Call with any questions or concerns Hang in there!

## 2023-07-07 NOTE — Progress Notes (Signed)
   Subjective:    Patient ID: Tracy [name], female    DOB: 26-Feb-1951, 73 y.o.   MRN: [id number]  HPI URI- pt was at [facility] on Monday for pre-op testing and was surrounded by sick people for over an hour.  Pt reports she has surgery scheduled for Monday.  She has a 'terrible cough'- not productive.  Throat is burning, + nasal congestion, fatigue.  + body aches.  No fever.  + HA.  No relief w/ Robitussin   Review of Systems For ROS see HPI     Objective:   Physical Exam Vitals reviewed.  Constitutional:      General: She is not in acute distress.    Appearance: She is well-developed.     Comments: Obviously not feeling well  HENT:     Head: Normocephalic and atraumatic.     Right Ear: Tympanic membrane and ear canal normal.     Left Ear: Tympanic membrane and ear canal normal.     Nose: Mucosal edema and congestion present. No rhinorrhea.     Right Sinus: No maxillary sinus tenderness or frontal sinus tenderness.     Left Sinus: No maxillary sinus tenderness or frontal sinus tenderness.     Mouth/Throat:     Pharynx: Uvula midline. Posterior oropharyngeal erythema present. No oropharyngeal exudate.  Eyes:     Conjunctiva/sclera: Conjunctivae normal.     Pupils: Pupils are equal, round, and reactive to light.  Cardiovascular:     Rate and Rhythm: Normal rate and regular rhythm.     Heart sounds: Normal heart sounds.  Pulmonary:     Effort: Pulmonary effort is normal. No respiratory distress.     Breath sounds: Normal breath sounds. No wheezing.     Comments: + hacking cough Musculoskeletal:     Cervical back: Normal range of motion and neck supple.  Lymphadenopathy:     Cervical: No cervical adenopathy.  Skin:    General: Skin is warm.  Neurological:     General: No focal deficit present.     Mental Status: She is alert and oriented to person, place, and time.  Psychiatric:        Mood and Affect: Mood normal.        Behavior: Behavior normal.            Assessment & Plan:  COVID- new.  Pt's sxs are consistent w/ dx and rapid test confirms.  Was likely exposed earlier this week.  Start Paxlovid.  Cough meds prn.  Reviewed supportive care and red flags that should prompt return.  Stressed need to call Ortho and make them aware due to upcoming surgery.  Pt expressed understanding and is in agreement w/ plan.

## 2023-07-13 NOTE — Telephone Encounter (Signed)
 Pt cancelled surgical clearance appt

## 2023-07-31 ENCOUNTER — Ambulatory Visit: Payer: Self-pay | Admitting: Family Medicine

## 2023-07-31 ENCOUNTER — Ambulatory Visit (INDEPENDENT_AMBULATORY_CARE_PROVIDER_SITE_OTHER): Admitting: Family Medicine

## 2023-07-31 VITALS — BP 128/72 | HR 68 | Temp 98.0°F | Ht 64.0 in | Wt 153.0 lb

## 2023-07-31 DIAGNOSIS — R109 Unspecified abdominal pain: Secondary | ICD-10-CM

## 2023-07-31 LAB — HEPATIC FUNCTION PANEL
ALT: 12 U/L (ref 0–35)
AST: 25 U/L (ref 0–37)
Albumin: 4.3 g/dL (ref 3.5–5.2)
Alkaline Phosphatase: 62 U/L (ref 39–117)
Bilirubin, Direct: 0.2 mg/dL (ref 0.0–0.3)
Total Bilirubin: 0.5 mg/dL (ref 0.2–1.2)
Total Protein: 6.8 g/dL (ref 6.0–8.3)

## 2023-07-31 LAB — CBC WITH DIFFERENTIAL/PLATELET
Basophils Absolute: 0 10*3/uL (ref 0.0–0.1)
Basophils Relative: 0.6 % (ref 0.0–3.0)
Eosinophils Absolute: 0.1 10*3/uL (ref 0.0–0.7)
Eosinophils Relative: 2.6 % (ref 0.0–5.0)
HCT: 39.3 % (ref 36.0–46.0)
Hemoglobin: 13.1 g/dL (ref 12.0–15.0)
Lymphocytes Relative: 25.9 % (ref 12.0–46.0)
Lymphs Abs: 1.5 10*3/uL (ref 0.7–4.0)
MCHC: 33.3 g/dL (ref 30.0–36.0)
MCV: 89 fl (ref 78.0–100.0)
Monocytes Absolute: 0.5 10*3/uL (ref 0.1–1.0)
Monocytes Relative: 8.4 % (ref 3.0–12.0)
Neutro Abs: 3.5 10*3/uL (ref 1.4–7.7)
Neutrophils Relative %: 62.5 % (ref 43.0–77.0)
Platelets: 244 10*3/uL (ref 150.0–400.0)
RBC: 4.42 Mil/uL (ref 3.87–5.11)
RDW: 13.4 % (ref 11.5–15.5)
WBC: 5.6 10*3/uL (ref 4.0–10.5)

## 2023-07-31 LAB — BASIC METABOLIC PANEL WITH GFR
BUN: 22 mg/dL (ref 6–23)
CO2: 33 meq/L — ABNORMAL HIGH (ref 19–32)
Calcium: 11.6 mg/dL — ABNORMAL HIGH (ref 8.4–10.5)
Chloride: 102 meq/L (ref 96–112)
Creatinine, Ser: 1.04 mg/dL (ref 0.40–1.20)
GFR: 52.64 mL/min — ABNORMAL LOW (ref >60.00)
Glucose, Bld: 94 mg/dL (ref 70–99)
Potassium: 4 meq/L (ref 3.5–5.1)
Sodium: 143 meq/L (ref 135–145)

## 2023-07-31 LAB — POCT URINALYSIS DIPSTICK
Bilirubin, UA: NEGATIVE
Blood, UA: NEGATIVE
Glucose, UA: NEGATIVE
Ketones, UA: NEGATIVE
Leukocytes, UA: NEGATIVE
Nitrite, UA: NEGATIVE
Protein, UA: POSITIVE — AB
Spec Grav, UA: 1.025 (ref 1.010–1.025)
Urobilinogen, UA: 0.2 U/dL
pH, UA: 6 (ref 5.0–8.0)

## 2023-07-31 LAB — AMYLASE: Amylase: 34 U/L (ref 27–131)

## 2023-07-31 LAB — LIPASE: Lipase: 15 U/L (ref 11.0–59.0)

## 2023-07-31 MED ORDER — VALACYCLOVIR HCL 1 G PO TABS: 1000.0000 mg | ORAL_TABLET | Freq: Three times a day (TID) | ORAL | 0 refills | Status: AC

## 2023-07-31 NOTE — Patient Instructions (Signed)
 Follow up as needed or as scheduled We'll notify you of your lab results and make any changes if needed START the Valtrex as directed in case of early shingles Continue to drink LOTS of fluids and eat as you are able Call with any questions or concerns Hang in there!

## 2023-07-31 NOTE — Progress Notes (Signed)
   Subjective:    Patient ID: Tracy [name], female    DOB: Sep 20, 1950, 73 y.o.   MRN: [id number]  HPI 'it burns really bad'- pt reports sxs started 2 days ago w/ burning of L abd.  + decreased appetite.  Located under L lower rib.  + bloating, fatigue.  Burning feels like it is tender to touch.  + nausea, no vomiting.  No diarrhea or constipation.   Review of Systems For ROS see HPI     Objective:   Physical Exam Vitals reviewed.  Constitutional:      General: She is not in acute distress.    Appearance: She is not ill-appearing.  HENT:     Head: Normocephalic and atraumatic.  Cardiovascular:     Rate and Rhythm: Normal rate and regular rhythm.  Pulmonary:     Effort: Pulmonary effort is normal. No respiratory distress.  Abdominal:     General: There is no distension.     Palpations: Abdomen is soft.     Tenderness: There is abdominal tenderness (TTP w/ light touch over L upper abdomen). There is guarding (voluntary guarding). There is no rebound.  Skin:    General: Skin is warm and dry.     Findings: No rash.  Neurological:     Mental Status: She is alert and oriented to person, place, and time. Mental status is at baseline.  Psychiatric:        Mood and Affect: Mood normal.        Behavior: Behavior normal.           Assessment & Plan:  L sided abd pain- new.  Pt reports this is different from previous diverticulitis and describes pain as 'burning'.  She has TTP w/ light touch and the burning persists even when not moving.  Has been under considerable emotional and physical stress (COVID) and concern is for shingles that has yet to appear.  Has had 1 shingles vaccine but not the 2nd.  Will check labs to r/o abd infxn or other causes of pain.  Start Valtrex.  Reviewed supportive care and red flags that should prompt return.  Pt expressed understanding and is in agreement w/ plan.

## 2023-08-01 ENCOUNTER — Other Ambulatory Visit: Payer: Self-pay

## 2023-08-02 ENCOUNTER — Telehealth: Payer: Self-pay

## 2023-08-02 ENCOUNTER — Encounter: Admitting: Pulmonary Disease

## 2023-08-02 ENCOUNTER — Ambulatory Visit: Payer: Self-pay

## 2023-08-02 DIAGNOSIS — G4733 Obstructive sleep apnea (adult) (pediatric): Secondary | ICD-10-CM

## 2023-08-02 NOTE — Telephone Encounter (Signed)
FYI-pt has appt today

## 2023-08-02 NOTE — Telephone Encounter (Addendum)
 Copied from CRM [id number]. Topic: Clinical - Order For Equipment >> Aug 02, 2023 12:48 PM Juliana Ocean wrote: Reason for CRM: stephanie w/ [facility] calling to ask if you can fill the order for her CPAP supplies.  Pt has an acute appt this afternoon, if the dr can speak to her about it. If not, pt has appt 10/03/2023 w/ Dr Linder Revere for her sleep appt. If she is not able discuss today, they will hold this order for her cpap supplies until her August appt.  [phone]  FYI Dr. Gaynell Keeler pt is requesting cpap supplies but is scheduled for acute ov

## 2023-08-02 NOTE — Telephone Encounter (Signed)
 FYI Only or Action Required?: FYI only for provider  Patient was last seen in primary care on 07/31/23. Called Nurse Triage reporting Back Pain with deep breath. Symptoms began yesterday. Interventions attempted: Nothing. Symptoms are: unchanged.  Triage Disposition: See Physician Within 24 Hours  Patient/caregiver understands and will follow disposition?: Yes, will follow disposition  Copied from CRM [id number]. Topic: Clinical - Red Word Triage >> Aug 02, 2023  9:05 AM Dimple Francis wrote: Red Word that prompted transfer to Nurse Triage: Pain under rib- burning sensation. When she breathes in, it hurts on her back, left lower area. Reason for Disposition  High-risk adult (e.g., history of cancer, HIV, or IV drug use)  Answer Assessment - Initial Assessment Questions 1. ONSET: "When did the pain begin?"      2 days 2. LOCATION: "Where does it hurt?" (upper, mid or lower back)     Back pain with deep breath,  3. SEVERITY: "How bad is the pain?"  (e.g., Scale 1-10; mild, moderate, or severe)   - MILD (1-3): Doesn't interfere with normal activities.    - MODERATE (4-7): Interferes with normal activities or awakens from sleep.    - SEVERE (8-10): Excruciating pain, unable to do any normal activities.      6 4. PATTERN: "Is the pain constant?" (e.g., yes, no; constant, intermittent)      Constant with each deep breath 5. RADIATION: "Does the pain shoot into your legs or somewhere else?"     chest 6. CAUSE:  "What do you think is causing the back pain?"      unsure 8. MEDICINES: "What have you taken so far for the pain?" (e.g., nothing, acetaminophen , NSAIDS)     Has not taken any  9. NEUROLOGIC SYMPTOMS: "Do you have any weakness, numbness, or problems with bowel/bladder control?"     Overall weakness, fatigue 10. OTHER SYMPTOMS: "Do you have any other symptoms?" (e.g., fever, abdomen pain, burning with urination, blood in urine)       Denies  Protocols used: Back Pain-A-AH

## 2023-08-02 NOTE — Telephone Encounter (Signed)
 FYI Only or Action Required?: FYI only for provider  Patient is followed in Pulmonology for OSA, last seen on 05/05/2022 by Wilder Handy, MD. Called Nurse Triage reporting Cough. Symptoms began several days ago. Interventions attempted: Increased fluids/rest and Other: saw PCP. Symptoms are: gradually worsening.  Triage Disposition: See HCP Within 4 Hours (Or PCP Triage)  Patient/caregiver understands and will follow disposition?: Yes          Copied from CRM [id number]. Topic: Clinical - Red Word Triage >> Aug 02, 2023  8:30 AM Whitney O wrote: [facility] that prompted transfer to Nurse Triage: had covid had stomch lung coughinf its areal burning sensation that goes around my rib to my back when i breath Reason for Disposition  [1] MILD difficulty breathing (e.g., minimal/no SOB at rest, SOB with walking, pulse <100) AND [2] still present when not coughing  Answer Assessment - Initial Assessment Questions 1. ONSET: "When did the cough begin?"      Worsening x 2 days 3. SPUTUM: "Describe the color of your sputum" (none, dry cough; clear, white, yellow, green)     None 4. HEMOPTYSIS: "Are you coughing up any blood?" If so ask: "How much?" (flecks, streaks, tablespoons, etc.)     None 5. DIFFICULTY BREATHING: "Are you having difficulty breathing?" If Yes, ask: "How bad is it?" (e.g., mild, moderate, severe)    - MILD: No SOB at rest, mild SOB with walking, speaks normally in sentences, can lie down, no retractions, pulse < 100.    - MODERATE: SOB at rest, SOB with minimal exertion and prefers to sit, cannot lie down flat, speaks in phrases, mild retractions, audible wheezing, pulse 100-120.    - SEVERE: Very SOB at rest, speaks in single words, struggling to breathe, sitting hunched forward, retractions, pulse > 120      Pain in back with respiration 6. FEVER: "Do you have a fever?" If Yes, ask: "What is your temperature, how was it measured, and when did it start?"     None  8. LUNG  HISTORY: "Do you have any history of lung disease?"  (e.g., pulmonary embolus, asthma, emphysema)     None 9. PE RISK FACTORS: "Do you have a history of blood clots?" (or: recent major surgery, recent prolonged travel, bedridden)     None 10. OTHER SYMPTOMS: "Do you have any other symptoms?" (e.g., runny nose, wheezing, chest pain)       Burning sensation under left rib, saw PCP for this  Protocols used: Cough - Acute Productive-A-AH

## 2023-08-03 ENCOUNTER — Ambulatory Visit (INDEPENDENT_AMBULATORY_CARE_PROVIDER_SITE_OTHER): Admitting: Family Medicine

## 2023-08-03 ENCOUNTER — Encounter: Payer: Self-pay | Admitting: Family Medicine

## 2023-08-03 ENCOUNTER — Ambulatory Visit (HOSPITAL_BASED_OUTPATIENT_CLINIC_OR_DEPARTMENT_OTHER)
Admission: RE | Admit: 2023-08-03 | Discharge: 2023-08-03 | Disposition: A | Discharge status: Home / Self Care | Source: Ambulatory Visit | Attending: Family Medicine | Admitting: Family Medicine

## 2023-08-03 VITALS — BP 124/68 | HR 81 | Temp 98.1°F | Wt 155.0 lb

## 2023-08-03 DIAGNOSIS — R0781 Pleurodynia: Secondary | ICD-10-CM

## 2023-08-03 DIAGNOSIS — Z96611 Presence of right artificial shoulder joint: Secondary | ICD-10-CM | POA: Diagnosis not present

## 2023-08-03 LAB — BASIC METABOLIC PANEL WITH GFR
BUN: 26 mg/dL — ABNORMAL HIGH (ref 6–23)
CO2: 28 meq/L (ref 19–32)
Calcium: 10.8 mg/dL — ABNORMAL HIGH (ref 8.4–10.5)
Chloride: 105 meq/L (ref 96–112)
Creatinine, Ser: 0.89 mg/dL (ref 0.40–1.20)
GFR: 63.46 mL/min (ref >60.00)
Glucose, Bld: 97 mg/dL (ref 70–99)
Potassium: 3.9 meq/L (ref 3.5–5.1)
Sodium: 143 meq/L (ref 135–145)

## 2023-08-03 NOTE — Progress Notes (Signed)
   Subjective:    Patient ID: Tracy [name], female    DOB: 30-Nov-1950, 73 y.o.   MRN: [id number]  HPI L side pain- pt reports that when she takes a deep breath she has pain in her back.  This was not mentioned on 6/2 visit.  Recently had COVID and suspected shingles.  Has an area that is TTP below posterior rib.  No known injury.  Not coughing recently.      Review of Systems For ROS see HPI     Objective:   Physical Exam Vitals reviewed.  Constitutional:      General: She is not in acute distress.    Appearance: Normal appearance. She is not ill-appearing.  HENT:     Head: Normocephalic and atraumatic.   Eyes:     Extraocular Movements: Extraocular movements intact.     Conjunctiva/sclera: Conjunctivae normal.    Cardiovascular:     Rate and Rhythm: Normal rate and regular rhythm.  Pulmonary:     Effort: Pulmonary effort is normal. No respiratory distress.     Breath sounds: No wheezing or rhonchi.   Musculoskeletal:        General: Tenderness (mild TTP below L posterior lower rib) present.   Skin:    General: Skin is warm and dry.     Findings: No bruising or rash.   Neurological:     General: No focal deficit present.     Mental Status: She is alert and oriented to person, place, and time.   Psychiatric:     Comments: Anxious, tearful           Assessment & Plan:  Pleuritic pain- new.  Pt able to take deep breath in office w/o difficulty.  Mild TTP over L lower back below ribs.  No deformity.  No overlying rash or erythema.  Will get xray to assess lungs as she is supposed to have orthopedic surgery upcoming.  Reviewed supportive care and red flags that should prompt return.  Pt expressed understanding and is in agreement w/ plan.

## 2023-08-03 NOTE — Patient Instructions (Signed)
 Follow up as needed or as scheduled GO to the [facility] on [address] and get your chest xray done Make sure you are continuing to take deep breaths even if it's uncomfortable- this will keep your lungs properly inflated RESTART Tylenol  as needed for pain Alternate heat or ice or whichever is more comfortable for pain relief Call with any questions or concerns Hang in there!!

## 2023-08-04 ENCOUNTER — Other Ambulatory Visit

## 2023-08-06 ENCOUNTER — Ambulatory Visit: Payer: Self-pay | Admitting: Family Medicine

## 2023-08-07 NOTE — Patient Instructions (Signed)
 SURGICAL WAITING ROOM VISITATION  Patients having surgery or a procedure may have no more than 2 support people in the waiting area - these visitors may rotate.    Children under the age of 71 must have an adult with them who is not the patient.  Visitors with respiratory illnesses are discouraged from visiting and should remain at home.  If the patient needs to stay at the hospital during part of their recovery, the visitor guidelines for inpatient rooms apply. Pre-op nurse will coordinate an appropriate time for 1 support person to accompany patient in pre-op.  This support person may not rotate.    Please refer to the [facility] website for the visitor guidelines for Inpatients (after your surgery is over and you are in a regular room).       Your procedure is scheduled on: 08/14/23   Report to [facility] Main Entrance    Report to admitting at 9 AM   Call this number if you have problems the morning of surgery [phone]   Do not eat food :After Midnight.   After Midnight you may have the following liquids until  8:30 AM DAY OF SURGERY  Water Non-Citrus Juices (without pulp, NO RED-Apple, White grape, White cranberry) Black Coffee (NO MILK/CREAM OR CREAMERS, sugar ok)  Clear Tea (NO MILK/CREAM OR CREAMERS, sugar ok) regular and decaf                             Plain Jell-O (NO RED)                                           Fruit ices (not with fruit pulp, NO RED)                                     Popsicles (NO RED)                                                               Sports drinks like Gatorade (NO RED)                  The day of surgery:  Drink ONE (1) Pre-Surgery Clear Ensure at 8:30 AM the morning of surgery. Drink in one sitting. Do not sip.  This drink was given to you during your hospital  pre-op appointment visit. Nothing else to drink after completing the  Pre-Surgery Clear Ensure .     Oral Hygiene is also important to reduce your  risk of infection.                                    Remember - BRUSH YOUR TEETH THE MORNING OF SURGERY WITH YOUR REGULAR TOOTHPASTE  DENTURES WILL BE REMOVED PRIOR TO SURGERY PLEASE DO NOT APPLY "Poly grip" OR ADHESIVES!!!   Stop all vitamins and herbal supplements 7 days before surgery.   Take these medicines the morning of surgery with A SIP OF WATER: Duloxetine (cymbalta ), Fenofibrate ,  Pantoprazole (Protonix ), eye drops  Bring CPAP mask and tubing day of surgery.                              You may not have any metal on your body including hair pins, jewelry, and body piercing             Do not wear make-up, lotions, powders, perfumes/cologne, or deodorant  Do not wear nail polish including gel and S&S, artificial/acrylic nails, or any other type of covering on natural nails including finger and toenails. If you have artificial nails, gel coating, etc. that needs to be removed by a nail salon please have this removed prior to surgery or surgery may need to be canceled/ delayed if the surgeon/ anesthesia feels like they are unable to be safely monitored.   Do not shave  48 hours prior to surgery.    Do not bring valuables to the hospital. [facility] IS NOT             RESPONSIBLE   FOR VALUABLES.   Contacts, glasses, dentures or bridgework may not be worn into surgery.   Bring small overnight bag day of surgery.   DO NOT BRING YOUR HOME MEDICATIONS TO THE HOSPITAL. PHARMACY WILL DISPENSE MEDICATIONS LISTED ON YOUR MEDICATION LIST TO YOU DURING YOUR ADMISSION IN THE HOSPITAL!    Patients discharged on the day of surgery will not be allowed to drive home.  Someone NEEDS to stay with you for the first 24 hours after anesthesia.   Special Instructions: Bring a copy of your healthcare power of attorney and living will documents the day of surgery if you haven't scanned them before.              Please read over the following fact sheets you were given: IF YOU HAVE QUESTIONS ABOUT  YOUR PRE-OP INSTRUCTIONS PLEASE CALL [phone] Tracy [name]   If you received a COVID test during your pre-op visit  it is requested that you wear a mask when out in public, stay away from anyone that may not be feeling well and notify your surgeon if you develop symptoms. If you test positive for Covid or have been in contact with anyone that has tested positive in the last 10 days please notify you surgeon.      Pre-operative 5 CHG Bath Instructions   You can play a key role in reducing the risk of infection after surgery. Your skin needs to be as free of germs as possible. You can reduce the number of germs on your skin by washing with CHG (chlorhexidine  gluconate) soap before surgery. CHG is an antiseptic soap that kills germs and continues to kill germs even after washing.   DO NOT use if you have an allergy to chlorhexidine /CHG or antibacterial soaps. If your skin becomes reddened or irritated, stop using the CHG and notify one of our RNs at [phone].   Please shower with the CHG soap starting 4 days before surgery using the following schedule:     Please keep in mind the following:  DO NOT shave, including legs and underarms, starting the day of your first shower.   You may shave your face at any point before/day of surgery.  Place clean sheets on your bed the day you start using CHG soap. Use a clean washcloth (not used since being washed) for each shower. DO NOT sleep with pets once you start using the  CHG.   CHG Shower Instructions:  If you choose to wash your hair and private area, wash first with your normal shampoo/soap.  After you use shampoo/soap, rinse your hair and body thoroughly to remove shampoo/soap residue.  Turn the water OFF and apply about 3 tablespoons (45 ml) of CHG soap to a CLEAN washcloth.  Apply CHG soap ONLY FROM YOUR NECK DOWN TO YOUR TOES (washing for 3-5 minutes)  DO NOT use CHG soap on face, private areas, open wounds, or sores.  Pay special  attention to the area where your surgery is being performed.  If you are having back surgery, having someone wash your back for you may be helpful. Wait 2 minutes after CHG soap is applied, then you may rinse off the CHG soap.  Pat dry with a clean towel  Put on clean clothes/pajamas   If you choose to wear lotion, please use ONLY the CHG-compatible lotions on the back of this paper.     Additional instructions for the day of surgery: DO NOT APPLY any lotions, deodorants, cologne, or perfumes.   Put on clean/comfortable clothes.  Brush your teeth.  Ask your nurse before applying any prescription medications to the skin.      CHG Compatible Lotions   Aveeno Moisturizing lotion  Cetaphil Moisturizing Cream  Cetaphil Moisturizing Lotion  Clairol Herbal Essence Moisturizing Lotion, Dry Skin  Clairol Herbal Essence Moisturizing Lotion, Extra Dry Skin  Clairol Herbal Essence Moisturizing Lotion, Normal Skin  Curel Age Defying Therapeutic Moisturizing Lotion with Alpha Hydroxy  Curel Extreme Care Body Lotion  Curel Soothing Hands Moisturizing Hand Lotion  Curel Therapeutic Moisturizing Cream, Fragrance-Free  Curel Therapeutic Moisturizing Lotion, Fragrance-Free  Curel Therapeutic Moisturizing Lotion, Original Formula  Eucerin Daily Replenishing Lotion  Eucerin Dry Skin Therapy Plus Alpha Hydroxy Crme  Eucerin Dry Skin Therapy Plus Alpha Hydroxy Lotion  Eucerin Original Crme  Eucerin Original Lotion  Eucerin Plus Crme Eucerin Plus Lotion  Eucerin TriLipid Replenishing Lotion  Keri Anti-Bacterial Hand Lotion  Keri Deep Conditioning Original Lotion Dry Skin Formula Softly Scented  Keri Deep Conditioning Original Lotion, Fragrance Free Sensitive Skin Formula  Keri Lotion Fast Absorbing Fragrance Free Sensitive Skin Formula  Keri Lotion Fast Absorbing Softly Scented Dry Skin Formula  Keri Original Lotion  Keri Skin Renewal Lotion Keri Silky Smooth Lotion  Keri Silky Smooth  Sensitive Skin Lotion  Nivea Body Creamy Conditioning Oil  Nivea Body Extra Enriched Lotion  Nivea Body Original Lotion  Nivea Body Sheer Moisturizing Lotion Nivea Crme  Nivea Skin Firming Lotion  NutraDerm 30 Skin Lotion  NutraDerm Skin Lotion  NutraDerm Therapeutic Skin Cream  NutraDerm Therapeutic Skin Lotion  ProShield Protective Hand Cream   Incentive Spirometer  An incentive spirometer is a tool that can help keep your lungs clear and active. This tool measures how well you are filling your lungs with each breath. Taking long deep breaths may help reverse or decrease the chance of developing breathing (pulmonary) problems (especially infection) following: A long period of time when you are unable to move or be active. BEFORE THE PROCEDURE  If the spirometer includes an indicator to show your best effort, your nurse or respiratory therapist will set it to a desired goal. If possible, sit up straight or lean slightly forward. Try not to slouch. Hold the incentive spirometer in an upright position. INSTRUCTIONS FOR USE  Sit on the edge of your bed if possible, or sit up as far as you can in bed or on  a chair. Hold the incentive spirometer in an upright position. Breathe out normally. Place the mouthpiece in your mouth and seal your lips tightly around it. Breathe in slowly and as deeply as possible, raising the piston or the ball toward the top of the column. Hold your breath for 3-5 seconds or for as long as possible. Allow the piston or ball to fall to the bottom of the column. Remove the mouthpiece from your mouth and breathe out normally. Rest for a few seconds and repeat Steps 1 through 7 at least 10 times every 1-2 hours when you are awake. Take your time and take a few normal breaths between deep breaths. The spirometer may include an indicator to show your best effort. Use the indicator as a goal to work toward during each repetition. After each set of 10 deep breaths,  practice coughing to be sure your lungs are clear. If you have an incision (the cut made at the time of surgery), support your incision when coughing by placing a pillow or rolled up towels firmly against it. Once you are able to get out of bed, walk around indoors and cough well. You may stop using the incentive spirometer when instructed by your caregiver.  RISKS AND COMPLICATIONS Take your time so you do not get dizzy or light-headed. If you are in pain, you may need to take or ask for pain medication before doing incentive spirometry. It is harder to take a deep breath if you are having pain. AFTER USE Rest and breathe slowly and easily. It can be helpful to keep track of a log of your progress. Your caregiver can provide you with a simple table to help with this. If you are using the spirometer at home, follow these instructions: SEEK MEDICAL CARE IF:  You are having difficultly using the spirometer. You have trouble using the spirometer as often as instructed. Your pain medication is not giving enough relief while using the spirometer. You develop fever of 100.5 F (38.1 C) or higher. SEEK IMMEDIATE MEDICAL CARE IF:  You cough up bloody sputum that had not been present before. You develop fever of 102 F (38.9 C) or greater. You develop worsening pain at or near the incision site. MAKE SURE YOU:  Understand these instructions. Will watch your condition. Will get help right away if you are not doing well or get worse. Document Released: 06/27/2006 Document Revised: 05/09/2011 Document Reviewed: 08/28/2006   WHAT IS A BLOOD TRANSFUSION? Blood Transfusion Information  A transfusion is the replacement of blood or some of its parts. Blood is made up of multiple cells which provide different functions. Red blood cells carry oxygen and are used for blood loss replacement. White blood cells fight against infection. Platelets control bleeding. Plasma helps clot blood. Other blood  products are available for specialized needs, such as hemophilia or other clotting disorders. BEFORE THE TRANSFUSION  Who gives blood for transfusions?  Healthy volunteers who are fully evaluated to make sure their blood is safe. This is blood bank blood. Transfusion therapy is the safest it has ever been in the practice of medicine. Before blood is taken from a donor, a complete history is taken to make sure that person has no history of diseases nor engages in risky social behavior (examples are intravenous drug use or sexual activity with multiple partners). The donor's travel history is screened to minimize risk of transmitting infections, such as malaria. The donated blood is tested for signs of infectious diseases, such  as HIV and hepatitis. The blood is then tested to be sure it is compatible with you in order to minimize the chance of a transfusion reaction. If you or a relative donates blood, this is often done in anticipation of surgery and is not appropriate for emergency situations. It takes many days to process the donated blood. RISKS AND COMPLICATIONS Although transfusion therapy is very safe and saves many lives, the main dangers of transfusion include:  Getting an infectious disease. Developing a transfusion reaction. This is an allergic reaction to something in the blood you were given. Every precaution is taken to prevent this. The decision to have a blood transfusion has been considered carefully by your caregiver before blood is given. Blood is not given unless the benefits outweigh the risks. AFTER THE TRANSFUSION Right after receiving a blood transfusion, you will usually feel much better and more energetic. This is especially true if your red blood cells have gotten low (anemic). The transfusion raises the level of the red blood cells which carry oxygen, and this usually causes an energy increase. The nurse administering the transfusion will monitor you carefully for  complications. HOME CARE INSTRUCTIONS  No special instructions are needed after a transfusion. You may find your energy is better. Speak with your caregiver about any limitations on activity for underlying diseases you may have. SEEK MEDICAL CARE IF:  Your condition is not improving after your transfusion. You develop redness or irritation at the intravenous (IV) site. SEEK IMMEDIATE MEDICAL CARE IF:  Any of the following symptoms occur over the next 12 hours: Shaking chills. You have a temperature by mouth above 102 F (38.9 C), not controlled by medicine. Chest, back, or muscle pain. People around you feel you are not acting correctly or are confused. Shortness of breath or difficulty breathing. Dizziness and fainting. You get a rash or develop hives. You have a decrease in urine output. Your urine turns a dark color or changes to pink, red, or brown. Any of the following symptoms occur over the next 10 days: You have a temperature by mouth above 102 F (38.9 C), not controlled by medicine. Shortness of breath. Weakness after normal activity. The white part of the eye turns yellow (jaundice). You have a decrease in the amount of urine or are urinating less often. Your urine turns a dark color or changes to pink, red, or brown. Document Released: 02/12/2000 Document Revised: 05/09/2011 Document Reviewed: 10/01/2007 [facility] Patient Information 2014 [place], [place].

## 2023-08-07 NOTE — Progress Notes (Signed)
 I would hold off on her 2nd shingles vaccine until after surgery in order not to potentially complicate things (fever, feeling poorly, etc)

## 2023-08-07 NOTE — Progress Notes (Signed)
 Pt has been notified of this request Pt will call us  after surgery to schedule this.

## 2023-08-07 NOTE — Progress Notes (Signed)
 COVID Vaccine received:  []  No [x]  Yes Date of any COVID positive Test in last 90 days: Jul 07, 2023 PCP - Laymon Priest MD Cardiologist - J. Berry MD  Chest x-ray - 08/03/23 Epic EKG -  06/13/23 Epic Stress Test -  ECHO - 10/17/18 Epic Cardiac Cath -   06/13/23 Cardiac clearance -Ervin Heath PA  Bowel Prep - [x]  No  []   Yes ______  Pacemaker / ICD device [x]  No []  Yes   Spinal Cord Stimulator:[x]  No []  Yes       History of Sleep Apnea? []  No [x]  Yes   CPAP used?- []  No [x]  Yes    Does the patient monitor blood sugar?          [x]  No []  Yes  []  N/A  Patient has: [x]  NO Hx DM   []  Pre-DM                 []  DM1  []   DM2 Does patient have a [employer] or Dexacom? []  No []  Yes   Fasting Blood Sugar Ranges-  Checks Blood Sugar _____ times a day  GLP1 agonist / usual dose - no GLP1 instructions:  SGLT-2 inhibitors / usual dose -no SGLT-2 instructions:   Blood Thinner / Instructions:no Aspirin  Instructions:no  Comments:   Activity level: Patient is able to climb a flight of stairs without difficulty; [x]  No CP  []  No SOB,    Patient canperform ADLs without assistance.   Anesthesia review:   Patient denies shortness of breath, fever, cough and chest pain at PAT appointment.  Patient verbalized understanding and agreement to the Pre-Surgical Instructions that were given to them at this PAT appointment. Patient was also educated of the need to review these PAT instructions again prior to his/her surgery.I reviewed the appropriate phone numbers to call if they have any and questions or concerns.

## 2023-08-08 ENCOUNTER — Encounter (HOSPITAL_COMMUNITY)
Admission: RE | Admit: 2023-08-08 | Discharge: 2023-08-08 | Disposition: A | Discharge status: Home / Self Care | Source: Ambulatory Visit | Attending: Orthopedic Surgery | Admitting: Orthopedic Surgery

## 2023-08-08 ENCOUNTER — Other Ambulatory Visit: Payer: Self-pay

## 2023-08-08 ENCOUNTER — Encounter (HOSPITAL_COMMUNITY): Payer: Self-pay

## 2023-08-08 VITALS — BP 129/73 | HR 64 | Temp 98.2°F | Resp 16 | Ht 64.0 in | Wt 153.0 lb

## 2023-08-08 DIAGNOSIS — Z01812 Encounter for preprocedural laboratory examination: Secondary | ICD-10-CM | POA: Insufficient documentation

## 2023-08-08 DIAGNOSIS — Z01818 Encounter for other preprocedural examination: Secondary | ICD-10-CM

## 2023-08-08 LAB — SURGICAL PCR SCREEN
MRSA, PCR: NEGATIVE
Staphylococcus aureus: POSITIVE — AB

## 2023-08-08 NOTE — Progress Notes (Signed)
 Note routed to Dr. France Ina to review pt's pre op PCR result from 08/08/23

## 2023-08-09 NOTE — H&P (Signed)
 TOTAL HIP ADMISSION H&P  Patient is admitted for right total hip arthroplasty.  Subjective:  Chief Complaint: Right hip pain  HPI: Tracy [name], 73 y.o. female, has a history of pain and functional disability in the right hip due to arthritis and patient has failed non-surgical conservative treatments for greater than 12 weeks to include NSAID's and/or analgesics, flexibility and strengthening excercises, and activity modification. Onset of symptoms was gradual, starting several years ago with rapidlly worsening course since that time. The patient noted no past surgery on the right hip. Patient currently rates pain in the right hip at 9 out of 10 with activity. Patient has night pain, worsening of pain with activity and weight bearing, and trendelenberg gait. Patient has evidence of severe bone-on-bone arthritis with subchondral cystic formation and marginal osteophytes by imaging studies. This condition presents safety issues increasing the risk of falls. There is no current active infection.   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   OSA (obstructive sleep apnea) 06/22/2020   Amputated toe (HCC) 05/20/2020   Hammer toe 04/27/2020   Dyspnea on exertion 10/10/2018   Overweight (BMI 25.0-29.9) 11/28/2017   S/P shoulder replacement, right 06/23/2017   Shoulder arthritis 03/01/2017   Perforation of sigmoid colon due to diverticulitis 07/05/2016   OA (osteoarthritis) of knee 08/17/2015   Thyroid  nodule, 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   Subclinical hyperthyroidism 06/05/2014   Trochanteric bursitis of left hip 04/01/2014   Rosacea 03/26/2014   Allergic rhinitis 02/19/2014   Left hip pain 12/19/2013   Atrophic vaginitis 10/07/2013   Screening for malignant neoplasm of cervix 01/12/2011   Physical exam 01/12/2011   HYPERTRIGLYCERIDEMIA 12/03/2009   GERD  12/03/2009   UTI'S, RECURRENT 12/03/2009   LEG CRAMPS, NOCTURNAL 12/03/2009   DIVERTICULITIS, HX OF 12/03/2009   Depression with anxiety 11/05/2009   Osteoporosis 11/05/2009    Past Medical History:  Diagnosis Date   Allergy    Anxiety    Arthritis    Bursitis of left hip    C. difficile diarrhea    Cancer (HCC)    basil cell   Cataract    bilateral   Complication of anesthesia    Depression    Diverticulitis    GERD (gastroesophageal reflux disease)    Hx: UTI (urinary tract infection)    Hyperlipidemia    Hyperthyroidism    Osteopenia    Osteoporosis    PONV (postoperative nausea and vomiting)    Rosacea    Rotator cuff arthropathy, left     Past Surgical History:  Procedure Laterality Date   APPENDECTOMY     BACK SURGERY     CATARACT EXTRACTION, BILATERAL  11/2018   CHOLECYSTECTOMY     COLONOSCOPY     FOOT SURGERY Right    X 2   KNEE SURGERY     LAPAROSCOPIC LIVER CYST FENESTRATION     LUMBAR DISC SURGERY Left    OPEN SURGICAL REPAIR OF GLUTEAL TENDON Left 04/01/2014   Procedure: LEFT HIP BURSECTOMY WITH GLUTEAL TENDON REPAIR;  Surgeon: Aurther Blue, MD;  Location: [facility];  Service: Orthopedics;  Laterality: Left;   REVERSE SHOULDER ARTHROPLASTY Right 06/23/2017   Procedure: RIGHT REVERSE SHOULDER ARTHROPLASTY;  Surgeon: Winston Hawking, MD;  Location: [facility] OR;  Service: Orthopedics;  Laterality: Right;   TONSILLECTOMY     TOTAL KNEE ARTHROPLASTY Right 08/17/2015   Procedure: RIGHT  TOTAL KNEE ARTHROPLASTY;  Surgeon: Liliane Rei, MD;  Location: [facility];  Service: Orthopedics;  Laterality: Right;   TUBAL LIGATION      Prior to Admission medications   Medication Sig Start Date End Date Taking? Authorizing Provider  Calcium  Carbonate-Vitamin D  (CALCIUM  600+D PO) Take 1 tablet by mouth 2 (two) times daily.   Yes [provider]  Cholecalciferol  (VITAMIN D ) 2000 units tablet Take 2,000 Units by mouth daily.   Yes [provider]  denosumab   (PROLIA ) 60 MG/ML SOSY injection Inject 60 mg into the skin every 6 (six) months.   Yes [provider]  DULoxetine  (CYMBALTA ) 20 MG capsule Take 1 capsule (20 mg total) by mouth daily. 06/07/23  Yes Tabori, Katherine E, MD  fenofibrate  160 MG tablet TAKE 1 TABLET(160 MG) BY MOUTH DAILY 06/20/23  Yes Tabori, Katherine E, MD  Melatonin 5 MG TABS Take 7.5 mg by mouth at bedtime.   Yes [provider]  Multiple Vitamin (MULTIVITAMIN WITH MINERALS) TABS tablet Take 1 tablet by mouth daily.   Yes [provider]  pantoprazole  (PROTONIX ) 40 MG tablet Take 1 tablet (40 mg total) by mouth 2 (two) times daily. 04/11/23  Yes Tabori, Katherine E, MD  polyethylene glycol (MIRALAX  / GLYCOLAX ) packet Take 17 g by mouth daily. 08/29/16  Yes Farris Hong, PA-C  Propylene Glycol, PF, (SYSTANE COMPLETE PF) 0.6 % SOLN Place 1 drop into both eyes 2 (two) times daily.   Yes [provider]    Allergies  Allergen Reactions   Amoxicillin -Pot Clavulanate Diarrhea    Severe diarrhea    Nylon Hives   Demerol [Meperidine] Nausea And Vomiting   Latex Rash, Other (See Comments) and Hives    Reaction:  Blisters    Other Itching    Nylon sheets, pt prefers cotton sheets    Tramadol  Nausea And Vomiting    Social History   Socioeconomic History   Marital status: Married    Spouse name: Not on file   Number of children: Not on file   Years of education: Not on file   Highest education level: Some college, no degree  Occupational History   Not on file  Tobacco Use   Smoking status: Never   Smokeless tobacco: Never  Vaping Use   Vaping status: Never Used  Substance and Sexual Activity   Alcohol use: No   Drug use: No   Sexual activity: Not Currently  Other Topics Concern   Not on file  Social History Narrative   Not on file   Social Drivers of Health   Financial Resource Strain: Low Risk  (07/31/2023)   Overall Financial Resource Strain (CARDIA)    Difficulty of  Paying Living Expenses: Not hard at all  Food Insecurity: No Food Insecurity (07/31/2023)   Hunger Vital Sign    Worried About Running Out of Food in the Last Year: Never true    Ran Out of Food in the Last Year: Never true  Transportation Needs: No Transportation Needs (07/31/2023)   PRAPARE - Administrator, Civil Service (Medical): No    Lack of Transportation (Non-Medical): No  Physical Activity: Unknown (07/31/2023)   Exercise Vital Sign    Days of Exercise per Week: 0 days    Minutes of Exercise per Session: Not on file  Stress: No Stress Concern Present (07/31/2023)   [employer] of Occupational Health - Occupational Stress Questionnaire    Feeling of Stress : Only a little  Social Connections: Moderately Integrated (07/31/2023)   Social Connection and Isolation Panel [NHANES]    Frequency of Communication with Friends and Family: More than three times a week    Frequency of Social Gatherings with Friends and Family: Once a week    Attends Religious Services: More than 4 times per year    Active Member of [employer] or Organizations: No    Attends Engineer, structural: Not on file    Marital Status: Married  Catering manager Violence: Not At Risk (06/29/2022)   Humiliation, Afraid, Rape, and Kick questionnaire    Fear of Current or Ex-Partner: No    Emotionally Abused: No    Physically Abused: No    Sexually Abused: No    Tobacco Use: Low Risk  (08/08/2023)   Patient History    Smoking Tobacco Use: Never    Smokeless Tobacco Use: Never    Passive Exposure: Not on file   Social History   Substance and Sexual Activity  Alcohol Use No    Family History  Problem Relation Age of Onset   Hypertension Mother    Stroke Mother    Liver disease Father        alcohol related   COPD Father    Bone cancer Paternal Grandfather    Cancer Neg Hx    Diabetes Neg Hx    Colon cancer Neg Hx    Esophageal cancer Neg Hx    Pancreatic cancer Neg Hx    Stomach cancer  Neg Hx     ROS   Objective:  Physical Exam: Well nourished and well developed.  General: Alert and oriented x3, cooperative and pleasant, no acute distress.  Head: normocephalic, atraumatic, neck supple.  Eyes: EOMI. Abdomen: non-tender to palpation and soft, normoactive bowel sounds. Musculoskeletal: - Right hip can be flexed to approximately 90 degrees with no internal rotation, only approximately 10 degrees of external rotation, and 10 degrees of abduction. There is no trochanteric tenderness  - Significantly antalgic gait pattern on the right. Calves soft and nontender. Motor function intact in LE. Strength 5/5 LE bilaterally. Neuro: Distal pulses 2+. Sensation to light touch intact in LE.   Vital signs in last 24 hours:    Imaging Review Plain radiographs demonstrate severe degenerative joint disease of the right hip. The bone quality appears to be adequate for age and reported activity level.  Assessment/Plan:  End stage arthritis, right hip  The patient history, physical examination, clinical judgement of the provider and imaging studies are consistent with end stage degenerative joint disease of the right hip and total hip arthroplasty is deemed medically necessary. The treatment options including medical management, injection therapy, arthroscopy and arthroplasty were discussed at length. The risks and benefits of total hip arthroplasty were presented and reviewed. The risks due to aseptic loosening, infection, stiffness, dislocation/subluxation, thromboembolic complications and other imponderables were discussed. The patient acknowledged the explanation, agreed to proceed with the plan and consent was signed. Patient is being admitted for inpatient treatment for surgery, pain control, PT, OT, prophylactic antibiotics, VTE prophylaxis, progressive ambulation and ADLs and discharge planning.The patient is planning to be discharged home.  Therapy Plans: HEP Disposition: Home  with Husband Planned DVT Prophylaxis: Aspirin  81 mg BID DME Needed: None PCP: Laymon Priest, MD (EPIC notes) Cardiologist: (EPIC note 04/15) TXA: IV Allergies: latex (hives), tramadol  (N/V), amoxillin (diarrhea), meperidine (N/V) Anesthesia Concerns: None BMI: 24.6 Last HgbA1c: not diabetic  Pharmacy: [facility] [place], [place])   - Patient was instructed  on what medications to stop prior to surgery. - Follow-up visit in 2 weeks with Dr. France Ina - Begin physical therapy following surgery - Pre-operative lab work as pre-surgical testing - Prescriptions will be provided in hospital at time of discharge  R. Brinton Canavan, PA-C Orthopedic Surgery [facility] [place]

## 2023-08-14 ENCOUNTER — Encounter (HOSPITAL_COMMUNITY)
Admission: RE | Disposition: A | Discharge status: Home / Self Care | Payer: Self-pay | Source: Home / Self Care | Attending: Orthopedic Surgery

## 2023-08-14 ENCOUNTER — Observation Stay (HOSPITAL_COMMUNITY)

## 2023-08-14 ENCOUNTER — Ambulatory Visit (HOSPITAL_COMMUNITY): Payer: Self-pay | Admitting: Medical

## 2023-08-14 ENCOUNTER — Encounter (HOSPITAL_COMMUNITY): Payer: Self-pay | Admitting: Orthopedic Surgery

## 2023-08-14 ENCOUNTER — Other Ambulatory Visit: Payer: Self-pay

## 2023-08-14 ENCOUNTER — Ambulatory Visit (HOSPITAL_COMMUNITY)

## 2023-08-14 ENCOUNTER — Ambulatory Visit (HOSPITAL_BASED_OUTPATIENT_CLINIC_OR_DEPARTMENT_OTHER): Admitting: Anesthesiology

## 2023-08-14 ENCOUNTER — Observation Stay (HOSPITAL_COMMUNITY)
Admission: RE | Admit: 2023-08-14 | Discharge: 2023-08-15 | Disposition: A | Discharge status: Home / Self Care | Payer: Medicare Other | Attending: Orthopedic Surgery | Admitting: Orthopedic Surgery

## 2023-08-14 DIAGNOSIS — M1612 Unilateral primary osteoarthritis, left hip: Secondary | ICD-10-CM | POA: Diagnosis not present

## 2023-08-14 DIAGNOSIS — Z85828 Personal history of other malignant neoplasm of skin: Secondary | ICD-10-CM | POA: Insufficient documentation

## 2023-08-14 DIAGNOSIS — Z91040 Latex allergy status: Secondary | ICD-10-CM | POA: Insufficient documentation

## 2023-08-14 DIAGNOSIS — Z471 Aftercare following joint replacement surgery: Secondary | ICD-10-CM | POA: Diagnosis not present

## 2023-08-14 DIAGNOSIS — Z96611 Presence of right artificial shoulder joint: Secondary | ICD-10-CM | POA: Insufficient documentation

## 2023-08-14 DIAGNOSIS — Z96651 Presence of right artificial knee joint: Secondary | ICD-10-CM | POA: Diagnosis not present

## 2023-08-14 DIAGNOSIS — M1611 Unilateral primary osteoarthritis, right hip: Principal | ICD-10-CM | POA: Insufficient documentation

## 2023-08-14 DIAGNOSIS — Z96641 Presence of right artificial hip joint: Secondary | ICD-10-CM | POA: Diagnosis not present

## 2023-08-14 DIAGNOSIS — Z79899 Other long term (current) drug therapy: Secondary | ICD-10-CM | POA: Insufficient documentation

## 2023-08-14 DIAGNOSIS — M169 Osteoarthritis of hip, unspecified: Principal | ICD-10-CM | POA: Diagnosis present

## 2023-08-14 HISTORY — PX: TOTAL HIP ARTHROPLASTY: SHX124

## 2023-08-14 LAB — TYPE AND SCREEN
ABO/RH(D): A POS
Antibody Screen: NEGATIVE

## 2023-08-14 SURGERY — ARTHROPLASTY, HIP, TOTAL, ANTERIOR APPROACH
Anesthesia: Spinal | Site: Hip | Laterality: Right

## 2023-08-14 MED ORDER — LACTATED RINGERS IV SOLN: INTRAVENOUS | Status: DC

## 2023-08-14 MED ORDER — MORPHINE SULFATE (PF) 2 MG/ML IV SOLN
0.5000 mg | INTRAVENOUS | Status: DC | PRN
  Administered 2023-08-14: 1 mg via INTRAVENOUS
  Filled 2023-08-14: qty 1

## 2023-08-14 MED ORDER — ACETAMINOPHEN 10 MG/ML IV SOLN
1000.0000 mg | Freq: Four times a day (QID) | INTRAVENOUS | Status: DC
  Administered 2023-08-14: 1000 mg via INTRAVENOUS
  Filled 2023-08-14: qty 100

## 2023-08-14 MED ORDER — DEXAMETHASONE SODIUM PHOSPHATE 10 MG/ML IJ SOLN
10.0000 mg | Freq: Once | INTRAMUSCULAR | Status: AC
  Administered 2023-08-15: 10 mg via INTRAVENOUS
  Filled 2023-08-14: qty 1

## 2023-08-14 MED ORDER — METOCLOPRAMIDE HCL 5 MG/ML IJ SOLN: 5.0000 mg | Freq: Three times a day (TID) | INTRAMUSCULAR | Status: DC | PRN

## 2023-08-14 MED ORDER — DEXAMETHASONE SODIUM PHOSPHATE 10 MG/ML IJ SOLN
INTRAMUSCULAR | Status: AC
  Filled 2023-08-14: qty 4

## 2023-08-14 MED ORDER — MEPERIDINE HCL 50 MG/ML IJ SOLN: 6.2500 mg | INTRAMUSCULAR | Status: DC | PRN

## 2023-08-14 MED ORDER — BISACODYL 10 MG RE SUPP: 10.0000 mg | Freq: Every day | RECTAL | Status: DC | PRN

## 2023-08-14 MED ORDER — PROPYLENE GLYCOL (PF) 0.6 % OP SOLN: 1.0000 [drp] | Freq: Two times a day (BID) | OPHTHALMIC | Status: DC

## 2023-08-14 MED ORDER — DEXAMETHASONE SODIUM PHOSPHATE 10 MG/ML IJ SOLN
8.0000 mg | Freq: Once | INTRAMUSCULAR | Status: AC
  Administered 2023-08-14: 8 mg via INTRAVENOUS

## 2023-08-14 MED ORDER — DROPERIDOL 2.5 MG/ML IJ SOLN: 0.6250 mg | Freq: Once | INTRAMUSCULAR | Status: DC | PRN

## 2023-08-14 MED ORDER — ORAL CARE MOUTH RINSE: 15.0000 mL | Freq: Once | OROMUCOSAL | Status: AC

## 2023-08-14 MED ORDER — DOCUSATE SODIUM 100 MG PO CAPS
100.0000 mg | ORAL_CAPSULE | Freq: Two times a day (BID) | ORAL | Status: DC
  Administered 2023-08-14 – 2023-08-15 (×2): 100 mg via ORAL
  Filled 2023-08-14 (×2): qty 1

## 2023-08-14 MED ORDER — METOCLOPRAMIDE HCL 5 MG PO TABS: 5.0000 mg | ORAL_TABLET | Freq: Three times a day (TID) | ORAL | Status: DC | PRN

## 2023-08-14 MED ORDER — ONDANSETRON HCL 4 MG/2ML IJ SOLN
INTRAMUSCULAR | Status: DC | PRN
  Administered 2023-08-14: 4 mg via INTRAVENOUS

## 2023-08-14 MED ORDER — ONDANSETRON HCL 4 MG/2ML IJ SOLN
INTRAMUSCULAR | Status: AC
  Filled 2023-08-14: qty 2

## 2023-08-14 MED ORDER — DEXAMETHASONE SODIUM PHOSPHATE 10 MG/ML IJ SOLN
INTRAMUSCULAR | Status: AC
  Filled 2023-08-14: qty 1

## 2023-08-14 MED ORDER — DULOXETINE HCL 20 MG PO CPEP
20.0000 mg | ORAL_CAPSULE | Freq: Every day | ORAL | Status: DC
  Administered 2023-08-15: 20 mg via ORAL
  Filled 2023-08-14: qty 1

## 2023-08-14 MED ORDER — LIDOCAINE HCL (PF) 2 % IJ SOLN
INTRAMUSCULAR | Status: AC
  Filled 2023-08-14: qty 20

## 2023-08-14 MED ORDER — ASPIRIN 81 MG PO CHEW
81.0000 mg | CHEWABLE_TABLET | Freq: Two times a day (BID) | ORAL | Status: DC
Start: 2023-08-15 — End: 2023-08-15
  Administered 2023-08-15: 81 mg via ORAL
  Filled 2023-08-14: qty 1

## 2023-08-14 MED ORDER — CHLORHEXIDINE GLUCONATE 4 % EX SOLN: 1.0000 | CUTANEOUS | 1 refills | Status: DC

## 2023-08-14 MED ORDER — PROPOFOL 1000 MG/100ML IV EMUL
INTRAVENOUS | Status: AC
  Filled 2023-08-14: qty 100

## 2023-08-14 MED ORDER — OXYCODONE HCL 5 MG/5ML PO SOLN: 5.0000 mg | Freq: Once | ORAL | Status: DC | PRN

## 2023-08-14 MED ORDER — LIDOCAINE HCL (PF) 2 % IJ SOLN
INTRAMUSCULAR | Status: AC
  Filled 2023-08-14: qty 5

## 2023-08-14 MED ORDER — HYDROMORPHONE HCL 1 MG/ML IJ SOLN: 0.2500 mg | INTRAMUSCULAR | Status: DC | PRN

## 2023-08-14 MED ORDER — WATER FOR IRRIGATION, STERILE IR SOLN
Status: DC | PRN
  Administered 2023-08-14: 2000 mL

## 2023-08-14 MED ORDER — POLYETHYLENE GLYCOL 3350 17 G PO PACK: 17.0000 g | PACK | Freq: Every day | ORAL | Status: DC | PRN

## 2023-08-14 MED ORDER — BUPIVACAINE-EPINEPHRINE (PF) 0.25% -1:200000 IJ SOLN
INTRAMUSCULAR | Status: DC | PRN
  Administered 2023-08-14: 30 mL via PERINEURAL

## 2023-08-14 MED ORDER — PHENOL 1.4 % MT LIQD: 1.0000 | OROMUCOSAL | Status: DC | PRN

## 2023-08-14 MED ORDER — CEFAZOLIN SODIUM-DEXTROSE 2-4 GM/100ML-% IV SOLN
2.0000 g | INTRAVENOUS | Status: AC
  Administered 2023-08-14: 2 g via INTRAVENOUS
  Filled 2023-08-14: qty 100

## 2023-08-14 MED ORDER — PHENYLEPHRINE HCL (PRESSORS) 10 MG/ML IV SOLN
INTRAVENOUS | Status: AC
  Filled 2023-08-14: qty 1

## 2023-08-14 MED ORDER — POVIDONE-IODINE 10 % EX SWAB: 2.0000 | Freq: Once | CUTANEOUS | Status: DC

## 2023-08-14 MED ORDER — METHOCARBAMOL 500 MG PO TABS
500.0000 mg | ORAL_TABLET | Freq: Four times a day (QID) | ORAL | Status: DC | PRN
  Administered 2023-08-14 – 2023-08-15 (×3): 500 mg via ORAL
  Filled 2023-08-14 (×3): qty 1

## 2023-08-14 MED ORDER — MAGNESIUM CITRATE PO SOLN
1.0000 | Freq: Once | ORAL | Status: DC | PRN
Start: 2023-08-14 — End: 2023-08-15

## 2023-08-14 MED ORDER — SODIUM CHLORIDE 0.9 % IV SOLN: INTRAVENOUS | Status: DC

## 2023-08-14 MED ORDER — PANTOPRAZOLE SODIUM 40 MG PO TBEC
40.0000 mg | DELAYED_RELEASE_TABLET | Freq: Two times a day (BID) | ORAL | Status: DC
  Administered 2023-08-14 – 2023-08-15 (×2): 40 mg via ORAL
  Filled 2023-08-14 (×2): qty 1

## 2023-08-14 MED ORDER — ONDANSETRON HCL 4 MG/2ML IJ SOLN
INTRAMUSCULAR | Status: AC
  Filled 2023-08-14: qty 6

## 2023-08-14 MED ORDER — ONDANSETRON HCL 4 MG PO TABS: 4.0000 mg | ORAL_TABLET | Freq: Four times a day (QID) | ORAL | Status: DC | PRN

## 2023-08-14 MED ORDER — PROPOFOL 10 MG/ML IV BOLUS
INTRAVENOUS | Status: AC
  Filled 2023-08-14: qty 20

## 2023-08-14 MED ORDER — OXYCODONE HCL 5 MG PO TABS: 5.0000 mg | ORAL_TABLET | Freq: Once | ORAL | Status: DC | PRN

## 2023-08-14 MED ORDER — ACETAMINOPHEN 10 MG/ML IV SOLN
1000.0000 mg | Freq: Once | INTRAVENOUS | Status: DC | PRN
Start: 2023-08-14 — End: 2023-08-14

## 2023-08-14 MED ORDER — HYDROCODONE-ACETAMINOPHEN 7.5-325 MG PO TABS
1.0000 | ORAL_TABLET | ORAL | Status: DC | PRN
  Administered 2023-08-14 – 2023-08-15 (×2): 1 via ORAL
  Administered 2023-08-15: 2 via ORAL
  Filled 2023-08-14: qty 1
  Filled 2023-08-14: qty 2
  Filled 2023-08-14: qty 1

## 2023-08-14 MED ORDER — MENTHOL 3 MG MT LOZG: 1.0000 | LOZENGE | OROMUCOSAL | Status: DC | PRN

## 2023-08-14 MED ORDER — CEFAZOLIN SODIUM-DEXTROSE 2-4 GM/100ML-% IV SOLN
2.0000 g | Freq: Four times a day (QID) | INTRAVENOUS | Status: AC
  Administered 2023-08-14 (×2): 2 g via INTRAVENOUS
  Filled 2023-08-14 (×2): qty 100

## 2023-08-14 MED ORDER — HYDROCODONE-ACETAMINOPHEN 5-325 MG PO TABS
1.0000 | ORAL_TABLET | ORAL | Status: DC | PRN
  Administered 2023-08-14: 1 via ORAL
  Administered 2023-08-15: 2 via ORAL
  Filled 2023-08-14: qty 2
  Filled 2023-08-14: qty 1

## 2023-08-14 MED ORDER — MUPIROCIN 2 % EX OINT: 1.0000 | TOPICAL_OINTMENT | Freq: Two times a day (BID) | CUTANEOUS | 0 refills | Status: AC

## 2023-08-14 MED ORDER — PHENYLEPHRINE HCL-NACL 20-0.9 MG/250ML-% IV SOLN
INTRAVENOUS | Status: DC | PRN
Start: 2023-08-14 — End: 2023-08-14
  Administered 2023-08-14: 30 ug/min via INTRAVENOUS

## 2023-08-14 MED ORDER — METHOCARBAMOL 1000 MG/10ML IJ SOLN: 500.0000 mg | Freq: Four times a day (QID) | INTRAMUSCULAR | Status: DC | PRN

## 2023-08-14 MED ORDER — CHLORHEXIDINE GLUCONATE 0.12 % MT SOLN
15.0000 mL | Freq: Once | OROMUCOSAL | Status: AC
  Administered 2023-08-14: 15 mL via OROMUCOSAL

## 2023-08-14 MED ORDER — ACETAMINOPHEN 325 MG PO TABS: 325.0000 mg | ORAL_TABLET | Freq: Four times a day (QID) | ORAL | Status: DC | PRN

## 2023-08-14 MED ORDER — TRANEXAMIC ACID-NACL 1000-0.7 MG/100ML-% IV SOLN
1000.0000 mg | INTRAVENOUS | Status: AC
  Administered 2023-08-14: 1000 mg via INTRAVENOUS
  Filled 2023-08-14: qty 100

## 2023-08-14 MED ORDER — BUPIVACAINE IN DEXTROSE 0.75-8.25 % IT SOLN
INTRATHECAL | Status: DC | PRN
Start: 2023-08-14 — End: 2023-08-14
  Administered 2023-08-14: 2 mL via INTRATHECAL

## 2023-08-14 MED ORDER — FENOFIBRATE 160 MG PO TABS
160.0000 mg | ORAL_TABLET | Freq: Every day | ORAL | Status: DC
  Administered 2023-08-15: 160 mg via ORAL
  Filled 2023-08-14: qty 1

## 2023-08-14 MED ORDER — LACTATED RINGERS IV SOLN: INTRAVENOUS | Status: DC | PRN

## 2023-08-14 MED ORDER — BUPIVACAINE-EPINEPHRINE (PF) 0.25% -1:200000 IJ SOLN
INTRAMUSCULAR | Status: AC
  Filled 2023-08-14: qty 30

## 2023-08-14 MED ORDER — ONDANSETRON HCL 4 MG/2ML IJ SOLN: 4.0000 mg | Freq: Four times a day (QID) | INTRAMUSCULAR | Status: DC | PRN

## 2023-08-14 MED ORDER — PROPOFOL 500 MG/50ML IV EMUL
INTRAVENOUS | Status: DC | PRN
  Administered 2023-08-14: 40 ug/kg/min via INTRAVENOUS

## 2023-08-14 MED ORDER — 0.9 % SODIUM CHLORIDE (POUR BTL) OPTIME
TOPICAL | Status: DC | PRN
  Administered 2023-08-14: 1000 mL

## 2023-08-14 MED ORDER — POLYVINYL ALCOHOL 1.4 % OP SOLN
1.0000 [drp] | Freq: Two times a day (BID) | OPHTHALMIC | Status: DC
  Administered 2023-08-14 – 2023-08-15 (×2): 1 [drp] via OPHTHALMIC
  Filled 2023-08-14: qty 15

## 2023-08-14 SURGICAL SUPPLY — 35 items
BAG COUNTER SPONGE SURGICOUNT (BAG) IMPLANT
BAG ZIPLOCK 12X15 (MISCELLANEOUS) IMPLANT
BLADE SAG 18X100X1.27 (BLADE) ×2 IMPLANT
COVER PERINEAL POST (MISCELLANEOUS) ×2 IMPLANT
COVER SURGICAL LIGHT HANDLE (MISCELLANEOUS) ×2 IMPLANT
CUP ACETBLR 48 OD SECTOR II (Hips) IMPLANT
DERMABOND ADVANCED .7 DNX12 (GAUZE/BANDAGES/DRESSINGS) ×2 IMPLANT
DRAPE FOOT SWITCH (DRAPES) ×2 IMPLANT
DRAPE STERI IOBAN 125X83 (DRAPES) ×2 IMPLANT
DRAPE U-SHAPE 47X51 STRL (DRAPES) ×4 IMPLANT
DRSG AQUACEL AG ADV 3.5X10 (GAUZE/BANDAGES/DRESSINGS) ×2 IMPLANT
DURAPREP 26ML APPLICATOR (WOUND CARE) ×2 IMPLANT
ELECT REM PT RETURN 15FT ADLT (MISCELLANEOUS) ×2 IMPLANT
GLOVE BIO SURGEON STRL SZ 6.5 (GLOVE) IMPLANT
GLOVE BIO SURGEON STRL SZ7 (GLOVE) IMPLANT
GLOVE BIO SURGEON STRL SZ8 (GLOVE) ×2 IMPLANT
GLOVE BIOGEL PI IND STRL 7.0 (GLOVE) IMPLANT
GLOVE BIOGEL PI IND STRL 8 (GLOVE) ×2 IMPLANT
GOWN STRL REUS W/ TWL LRG LVL3 (GOWN DISPOSABLE) ×4 IMPLANT
HEAD FEM STD 28X+1.5 STRL (Hips) IMPLANT
HOLDER FOLEY CATH W/STRAP (MISCELLANEOUS) ×2 IMPLANT
KIT TURNOVER KIT A (KITS) ×4 IMPLANT
LINER MARATHON 28 48 (Hips) IMPLANT
MANIFOLD NEPTUNE II (INSTRUMENTS) ×2 IMPLANT
PACK ANTERIOR HIP CUSTOM (KITS) ×2 IMPLANT
PENCIL SMOKE EVACUATOR COATED (MISCELLANEOUS) ×2 IMPLANT
SPIKE FLUID TRANSFER (MISCELLANEOUS) ×2 IMPLANT
STEM FEMORAL SZ 6MM STD ACTIS (Stem) IMPLANT
SUT ETHIBOND NAB CT1 #1 30IN (SUTURE) ×2 IMPLANT
SUT MNCRL AB 4-0 PS2 18 (SUTURE) ×2 IMPLANT
SUT VIC AB 2-0 CT1 TAPERPNT 27 (SUTURE) ×4 IMPLANT
SUTURE STRATFX 0 PDS 27 VIOLET (SUTURE) ×2 IMPLANT
TOWEL GREEN STERILE FF (TOWEL DISPOSABLE) ×2 IMPLANT
TRAY FOLEY MTR SLVR 16FR STAT (SET/KITS/TRAYS/PACK) ×2 IMPLANT
TUBE SUCTION HIGH CAP CLEAR NV (SUCTIONS) ×2 IMPLANT

## 2023-08-14 NOTE — Plan of Care (Signed)
   Problem: Activity: Goal: Risk for activity intolerance will decrease Outcome: Progressing   Problem: Pain Managment: Goal: General experience of comfort will improve and/or be controlled Outcome: Progressing   Problem: Safety: Goal: Ability to remain free from injury will improve Outcome: Progressing

## 2023-08-14 NOTE — Anesthesia Procedure Notes (Signed)
 Spinal  Start time: 08/14/2023 11:12 AM End time: 08/14/2023 11:15 AM Reason for block: surgical anesthesia Staffing Performed: anesthesiologist  Anesthesiologist: Willian Harrow, MD Performed by: Willian Harrow, MD Authorized by: Willian Harrow, MD   Preanesthetic Checklist Completed: patient identified, IV checked, site marked, risks and benefits discussed, surgical consent, monitors and equipment checked, pre-op evaluation and timeout performed Spinal Block Patient position: sitting Prep: DuraPrep and site prepped and draped Location: L3-4 Injection technique: single-shot Needle Needle type: Pencan  Needle gauge: 24 G Needle length: 10 cm Needle insertion depth: 10 cm Assessment Events: CSF return Additional Notes Patient tolerated well. No immediate complications.  Functioning IV was confirmed and monitors were applied. Sterile prep and drape, including hand hygiene and sterile gloves were used. The patient was positioned and the back was prepped. The skin was anesthetized with lidocaine . Free flow of clear CSF was obtained prior to injecting local anesthetic into the CSF. The spinal needle aspirated freely following injection. The needle was carefully withdrawn. The patient tolerated the procedure well.

## 2023-08-14 NOTE — Transfer of Care (Addendum)
 Immediate Anesthesia Transfer of Care Note  Patient: Tracy [name]  Procedure(s) Performed: ARTHROPLASTY, HIP, TOTAL, ANTERIOR APPROACH (Right: Hip)  Patient Location: PACU  Anesthesia Type:MAC combined with regional for post-op pain  Level of Consciousness: awake and alert   Airway & Oxygen Therapy: Patient Spontanous Breathing and Patient connected to nasal cannula oxygen  Post-op Assessment: Report given to RN and Post -op Vital signs reviewed and stable  Post vital signs: Reviewed and stable  Last Vitals:  Vitals Value Taken Time  BP    Temp    Pulse    Resp    SpO2      Last Pain:  Vitals:   08/14/23 0944  TempSrc:   PainSc: 7          Complications: No notable events documented.

## 2023-08-14 NOTE — Op Note (Signed)
 OPERATIVE REPORT- TOTAL HIP ARTHROPLASTY   PREOPERATIVE DIAGNOSIS: Osteoarthritis of the Right hip.   POSTOPERATIVE DIAGNOSIS: Osteoarthritis of the Right  hip.   PROCEDURE: Right total hip arthroplasty, anterior approach.   SURGEON: Liliane Rei, MD   ASSISTANT: Angelo Kennedy, PA-C  ANESTHESIA:  Spinal  ESTIMATED BLOOD LOSS:-200 mL    DRAINS: None  COMPLICATIONS: None   CONDITION: PACU - hemodynamically stable.   BRIEF CLINICAL NOTE: Tracy [name] is a 73 y.o. female who has advanced end-  stage arthritis of their Right  hip with progressively worsening pain and  dysfunction.The patient has failed nonoperative management and presents for  total hip arthroplasty.   PROCEDURE IN DETAIL: After successful administration of spinal  anesthetic, the traction boots for the [facility] bed were placed on both  feet and the patient was placed onto the [facility] bed, boots placed into the leg  holders. The Right hip was then isolated from the perineum with plastic  drapes and prepped and draped in the usual sterile fashion. ASIS and  greater trochanter were marked and a oblique incision was made, starting  at about 1 cm lateral and 2 cm distal to the ASIS and coursing towards  the anterior cortex of the femur. The skin was cut with a 10 blade  through subcutaneous tissue to the level of the fascia overlying the  tensor fascia lata muscle. The fascia was then incised in line with the  incision at the junction of the anterior third and posterior 2/3rd. The  muscle was teased off the fascia and then the interval between the TFL  and the rectus was developed. The Hohmann retractor was then placed at  the top of the femoral neck over the capsule. The vessels overlying the  capsule were cauterized and the fat on top of the capsule was removed.  A Hohmann retractor was then placed anterior underneath the rectus  femoris to give exposure to the entire anterior capsule. A T-shaped   capsulotomy was performed. The edges were tagged and the femoral head  was identified.       Osteophytes are removed off the superior acetabulum.  The femoral neck was then cut in situ with an oscillating saw. Traction  was then applied to the left lower extremity utilizing the [facility]  traction. The femoral head was then removed. Retractors were placed  around the acetabulum and then circumferential removal of the labrum was  performed. Osteophytes were also removed. Reaming starts at 45 mm to  medialize and  Increased in 2 mm increments to 47 mm. We reamed in  approximately 40 degrees of abduction, 20 degrees anteversion. A 48 mm  pinnacle acetabular shell was then impacted in anatomic position under  fluoroscopic guidance with excellent purchase. We did not need to place  any additional dome screws. A 28 mm neutral + 4 marathon liner was then  placed into the acetabular shell.       The femoral lift was then placed along the lateral aspect of the femur  just distal to the vastus ridge. The leg was  externally rotated and capsule  was stripped off the inferior aspect of the femoral neck down to the  level of the lesser trochanter, this was done with electrocautery. The femur was lifted after this was performed. The  leg was then placed in an extended and adducted position essentially delivering the femur. We also removed the capsule superiorly and the piriformis from the piriformis fossa to  gain excellent exposure of the  proximal femur. Rongeur was used to remove some cancellous bone to get  into the lateral portion of the proximal femur for placement of the  initial starter reamer. The starter broaches was placed  the starter broach  and was shown to go down the center of the canal. Broaching  with the Actis system was then performed starting at size 0  coursing  Up to size 6. A size 6 had excellent torsional and rotational  and axial stability. The trial standard offset neck was then  placed  with a 28 + 1.5 trial head. The hip was then reduced. We confirmed that  the stem was in the canal both on AP and lateral x-rays. It also has excellent sizing. The hip was reduced with outstanding stability through full extension and full external rotation.. AP pelvis was taken and the leg lengths were measured and found to be equal. Hip was then dislocated again and the femoral head and neck removed. The  femoral broach was removed. Size 6 Actis stem with a standard offset  neck was then impacted into the femur following native anteversion. Has  excellent purchase in the canal. Excellent torsional and rotational and  axial stability. It is confirmed to be in the canal on AP and lateral  fluoroscopic views. The 28 + 1.5 metal head was placed and the hip  reduced with outstanding stability. Again AP pelvis was taken and it  confirmed that the leg lengths were equal. The wound was then copiously  irrigated with saline solution and the capsule reattached and repaired  with Ethibond suture. 30 ml of .25% Bupivicaine was  injected into the capsule and into the edge of the tensor fascia lata as well as subcutaneous tissue. The fascia overlying the tensor fascia lata was then closed with a running #1 V-Loc. Subcu was closed with interrupted 2-0 Vicryl and subcuticular running 4-0 Monocryl. Incision was cleaned  and dried. Steri-Strips and a bulky sterile dressing applied. The patient was awakened and transported to  recovery in stable condition.        Please note that a surgical assistant was a medical necessity for this procedure to perform it in a safe and expeditious manner. Assistant was necessary to provide appropriate retraction of vital neurovascular structures and to prevent femoral fracture and allow for anatomic placement of the prosthesis.  Liliane Rei, M.D.

## 2023-08-14 NOTE — Discharge Instructions (Signed)
Tracy Aluisio, MD Total Joint Specialist [facility] [address] [place], [place] [address] [phone]  ANTERIOR APPROACH TOTAL HIP REPLACEMENT POSTOPERATIVE DIRECTIONS     Hip Rehabilitation, Guidelines Following Surgery  The results of a hip operation are greatly improved after range of motion and muscle strengthening exercises. Follow all safety measures which are given to protect your hip. If any of these exercises cause increased pain or swelling in your joint, decrease the amount until you are comfortable again. Then slowly increase the exercises. Call your caregiver if you have problems or questions.   BLOOD CLOT PREVENTION Take an 81 mg Aspirin two times a day for three weeks following surgery. Then take an 81 mg Aspirin once a day for three weeks. Then discontinue Aspirin. You may resume your vitamins/supplements upon discharge from the hospital. Do not take any NSAIDs (Advil, Aleve, Ibuprofen, Meloxicam, etc.) until you are 3 weeks out from surgery  HOME CARE INSTRUCTIONS  Remove items at home which could result in a fall. This includes throw rugs or furniture in walking pathways.  ICE to the affected hip as frequently as 20-30 minutes an hour and then as needed for pain and swelling. Continue to use ice on the hip for pain and swelling from surgery. You may notice swelling that will progress down to the foot and ankle. This is normal after surgery. Elevate the leg when you are not up walking on it.   Continue to use the breathing machine which will help keep your temperature down.  It is common for your temperature to cycle up and down following surgery, especially at night when you are not up moving around and exerting yourself.  The breathing machine keeps your lungs expanded and your temperature down.  DIET You may resume your previous home diet once your are discharged from the hospital.  DRESSING / WOUND CARE / SHOWERING You have an  adhesive waterproof bandage over the incision. Leave this in place until your first follow-up appointment. Once you remove this you will not need to place another bandage.  You may begin showering 3 days following surgery, but do not submerge the incision under water.  ACTIVITY For the first 3-5 days, it is important to rest and keep the operative leg elevated. You should, as a general rule, rest for 50 minutes and walk/stretch for 10 minutes per hour. After 5 days, you may slowly increase activity as tolerated.  Perform the exercises you were provided twice a day for about 15-20 minutes each session. Begin these 2 days following surgery. Walk with your walker as instructed. Use the walker until you are comfortable transitioning to a cane. Walk with the cane in the opposite hand of the operative leg. You may discontinue the cane once you are comfortable and walking steadily. Avoid periods of inactivity such as sitting longer than an hour when not asleep. This helps prevent blood clots.  Do not drive a car for 6 weeks or until released by your surgeon.  Do not drive while taking narcotics.  TED HOSE STOCKINGS Wear the elastic stockings on both legs for three weeks following surgery during the day. You may remove them at night while sleeping.  WEIGHT BEARING Weight bearing as tolerated with assist device (walker, cane, etc) as directed, use it as long as suggested by your surgeon or therapist, typically at least 4-6 weeks.  POSTOPERATIVE CONSTIPATION PROTOCOL Constipation - defined medically as fewer than three stools per week and severe constipation as   less than one stool per week.  One of the most common issues patients have following surgery is constipation.  Even if you have a regular bowel pattern at home, your normal regimen is likely to be disrupted due to multiple reasons following surgery.  Combination of anesthesia, postoperative narcotics, change in appetite and fluid intake all can  affect your bowels.  In order to avoid complications following surgery, here are some recommendations in order to help you during your recovery period.  Colace (docusate) - Pick up an over-the-counter form of Colace or another stool softener and take twice a day as long as you are requiring postoperative pain medications.  Take with a full glass of water daily.  If you experience loose stools or diarrhea, hold the colace until you stool forms back up.  If your symptoms do not get better within 1 week or if they get worse, check with your doctor. Dulcolax (bisacodyl) - Pick up over-the-counter and take as directed by the product packaging as needed to assist with the movement of your bowels.  Take with a full glass of water.  Use this product as needed if not relieved by Colace only.  MiraLax (polyethylene glycol) - Pick up over-the-counter to have on hand.  MiraLax is a solution that will increase the amount of water in your bowels to assist with bowel movements.  Take as directed and can mix with a glass of water, juice, soda, coffee, or tea.  Take if you go more than two days without a movement.Do not use MiraLax more than once per day. Call your doctor if you are still constipated or irregular after using this medication for 7 days in a row.  If you continue to have problems with postoperative constipation, please contact the office for further assistance and recommendations.  If you experience "the worst abdominal pain ever" or develop nausea or vomiting, please contact the office immediatly for further recommendations for treatment.  ITCHING  If you experience itching with your medications, try taking only a single pain pill, or even half a pain pill at a time.  You can also use Benadryl over the counter for itching or also to help with sleep.   MEDICATIONS See your medication summary on the "After Visit Summary" that the nursing staff will review with you prior to discharge.  You may have some home  medications which will be placed on hold until you complete the course of blood thinner medication.  It is important for you to complete the blood thinner medication as prescribed by your surgeon.  Continue your approved medications as instructed at time of discharge.  PRECAUTIONS If you experience chest pain or shortness of breath - call 911 immediately for transfer to the hospital emergency department.  If you develop a fever greater that 101 F, purulent drainage from wound, increased redness or drainage from wound, foul odor from the wound/dressing, or calf pain - CONTACT YOUR SURGEON.                                                   FOLLOW-UP APPOINTMENTS Make sure you keep all of your appointments after your operation with your surgeon and caregivers. You should call the office at the above phone number and make an appointment for approximately two weeks after the date of your surgery or on the   date instructed by your surgeon outlined in the "After Visit Summary".  RANGE OF MOTION AND STRENGTHENING EXERCISES  These exercises are designed to help you keep full movement of your hip joint. Follow your caregiver's or physical therapist's instructions. Perform all exercises about fifteen times, three times per day or as directed. Exercise both hips, even if you have had only one joint replacement. These exercises can be done on a training (exercise) mat, on the floor, on a table or on a bed. Use whatever works the best and is most comfortable for you. Use music or television while you are exercising so that the exercises are a pleasant break in your day. This will make your life better with the exercises acting as a break in routine you can look forward to.  Lying on your back, slowly slide your foot toward your buttocks, raising your knee up off the floor. Then slowly slide your foot back down until your leg is straight again.  Lying on your back spread your legs as far apart as you can without causing  discomfort.  Lying on your side, raise your upper leg and foot straight up from the floor as far as is comfortable. Slowly lower the leg and repeat.  Lying on your back, tighten up the muscle in the front of your thigh (quadriceps muscles). You can do this by keeping your leg straight and trying to raise your heel off the floor. This helps strengthen the largest muscle supporting your knee.  Lying on your back, tighten up the muscles of your buttocks both with the legs straight and with the knee bent at a comfortable angle while keeping your heel on the floor.   POST-OPERATIVE OPIOID TAPER INSTRUCTIONS: It is important to wean off of your opioid medication as soon as possible. If you do not need pain medication after your surgery it is ok to stop day one. Opioids include: Codeine, Hydrocodone(Norco, Vicodin), Oxycodone(Percocet, oxycontin) and hydromorphone amongst others.  Long term and even short term use of opiods can cause: Increased pain response Dependence Constipation Depression Respiratory depression And more.  Withdrawal symptoms can include Flu like symptoms Nausea, vomiting And more Techniques to manage these symptoms Hydrate well Eat regular healthy meals Stay active Use relaxation techniques(deep breathing, meditating, yoga) Do Not substitute Alcohol to help with tapering If you have been on opioids for less than two weeks and do not have pain than it is ok to stop all together.  Plan to wean off of opioids This plan should start within one week post op of your joint replacement. Maintain the same interval or time between taking each dose and first decrease the dose.  Cut the total daily intake of opioids by one tablet each day Next start to increase the time between doses. The last dose that should be eliminated is the evening dose.   IF YOU ARE TRANSFERRED TO A SKILLED REHAB FACILITY If the patient is transferred to a skilled rehab facility following release from the  hospital, a list of the current medications will be sent to the facility for the patient to continue.  When discharged from the skilled rehab facility, please have the facility set up the patient's Home Health Physical Therapy prior to being released. Also, the skilled facility will be responsible for providing the patient with their medications at time of release from the facility to include their pain medication, the muscle relaxants, and their blood thinner medication. If the patient is still at the rehab facility   at time of the two week follow up appointment, the skilled rehab facility will also need to assist the patient in arranging follow up appointment in our office and any transportation needs.  MAKE SURE YOU:  Understand these instructions.  Get help right away if you are not doing well or get worse.    DENTAL ANTIBIOTICS:  In most cases prophylactic antibiotics for Dental procdeures after total joint surgery are not necessary.  Exceptions are as follows:  1. History of prior total joint infection  2. Severely immunocompromised (Organ Transplant, cancer chemotherapy, Rheumatoid biologic meds such as Humera)  3. Poorly controlled diabetes (A1C &gt; 8.0, blood glucose over 200)  If you have one of these conditions, contact your surgeon for an antibiotic prescription, prior to your dental procedure.    Pick up stool softner and laxative for home use following surgery while on pain medications. Do not submerge incision under water. Please use good hand washing techniques while changing dressing each day. May shower starting three days after surgery. Please use a clean towel to pat the incision dry following showers. Continue to use ice for pain and swelling after surgery. Do not use any lotions or creams on the incision until instructed by your surgeon.  

## 2023-08-14 NOTE — Care Plan (Signed)
 Ortho Bundle Case Management Note  Patient Details  Name: Tracy [name] MRN: [id number] Date of Birth: 11-26-1950                  RT THA on 08/14/23 DCP: Home with husband DME: No needs, has RW PT: HEP   DME Arranged:  N/A DME Agency:         Additional Comments: Please contact me with any questions of if this plan should need to change.    Kathlene Paradise, Case Manager [facility]  [phone] 08/14/2023, 2:21 PM

## 2023-08-14 NOTE — Anesthesia Postprocedure Evaluation (Signed)
 Anesthesia Post Note  Patient: Tracy [name]  Procedure(s) Performed: ARTHROPLASTY, HIP, TOTAL, ANTERIOR APPROACH (Right: Hip)     Patient location during evaluation: PACU Anesthesia Type: Spinal Level of consciousness: oriented and awake and alert Pain management: pain level controlled Vital Signs Assessment: post-procedure vital signs reviewed and stable Respiratory status: spontaneous breathing, respiratory function stable and patient connected to nasal cannula oxygen Cardiovascular status: blood pressure returned to baseline and stable Postop Assessment: no headache, no backache and no apparent nausea or vomiting Anesthetic complications: no  No notable events documented.  Last Vitals:  Vitals:   08/14/23 1430 08/14/23 1450  BP: 130/68 (!) 142/70  Pulse: 63 66  Resp: 16 18  Temp:  36.6 C  SpO2: 96% 100%    Last Pain:  Vitals:   08/14/23 1430  TempSrc:   PainSc: 0-No pain                 Willian Harrow

## 2023-08-14 NOTE — Interval H&P Note (Signed)
 History and Physical Interval Note:  08/14/2023 9:23 AM  Tracy [name]  has presented today for surgery, with the diagnosis of right hip osteoarthritis.  The various methods of treatment have been discussed with the patient and family. After consideration of risks, benefits and other options for treatment, the patient has consented to  Procedure(s): ARTHROPLASTY, HIP, TOTAL, ANTERIOR APPROACH (Right) as a surgical intervention.  The patient's history has been reviewed, patient examined, no change in status, stable for surgery.  I have reviewed the patient's chart and labs.  Questions were answered to the patient's satisfaction.     Samuel Crock [name]

## 2023-08-14 NOTE — Evaluation (Signed)
 Physical Therapy Evaluation Patient Details Name: Tracy [name] MRN: [id number] DOB: 04-Jul-1950 Today's Date: 08/14/2023  History of Present Illness  73 yo female presents to therapy s/p R THA, anterior approach on 08/14/2023 due to failure of conservative measures. Pt PMH Includes but is not limited to: B LE pain, OA, DOE, R TSA, L hip trochanteric bursitis s/p bursectomy and glute tendon repair, lumbar surgery, R foot surgery, HLD, GERD, diverticulitis, GAD, and R TKA.  Clinical Impression     Tracy [name] is a 73 y.o. female POD 0 s/p R THA. Patient reports IND with mobility at baseline. Patient is now limited by functional impairments (see PT problem list below) and requires Mod for bed mobility and min A for transfers. Patient was able to ambulate 25 feet with RW and CGA level of assist. Patient instructed in exercise to facilitate ROM and circulation to manage edema.  Patient will benefit from continued skilled PT interventions to address impairments and progress towards PLOF. Acute PT will follow to progress mobility and stair training in preparation for safe discharge home with family support and HEP.       If plan is discharge home, recommend the following: A little help with walking and/or transfers;A little help with bathing/dressing/bathroom;Assistance with cooking/housework;Help with stairs or ramp for entrance;Assist for transportation   Can travel by private vehicle        Equipment Recommendations None recommended by PT  Recommendations for Other Services       Functional Status Assessment Patient has had a recent decline in their functional status and demonstrates the ability to make significant improvements in function in a reasonable and predictable amount of time.     Precautions / Restrictions Precautions Precautions: Fall Restrictions Weight Bearing Restrictions Per Provider Order: No      Mobility  Bed Mobility Overal bed mobility: Needs  Assistance Bed Mobility: Supine to Sit     Supine to sit: Mod assist     General bed mobility comments: cues and use of hospital bed    Transfers Overall transfer level: Needs assistance Equipment used: Rolling walker (2 wheels) Transfers: Sit to/from Stand Sit to Stand: Min assist, From elevated surface           General transfer comment: pull to stand and cues    Ambulation/Gait Ambulation/Gait assistance: Contact guard assist Gait Distance (Feet): 25 Feet Assistive device: Rolling walker (2 wheels) Gait Pattern/deviations: Step-to pattern, Decreased stance time - right, Antalgic Gait velocity: decreased     General Gait Details: slow cadence, B UE support at RW to offload R LE in stance phase, min cues for posture and proper distance from [employer]            Wheelchair Mobility     Tilt Bed    Modified Rankin (Stroke Patients Only)       Balance Overall balance assessment: Needs assistance Sitting-balance support: Feet supported Sitting balance-Leahy Scale: Good     Standing balance support: Bilateral upper extremity supported, During functional activity, Reliant on assistive device for balance Standing balance-Leahy Scale: Poor                               Pertinent Vitals/Pain Pain Assessment Pain Assessment: 0-10 Pain Score: 7  Pain Location: R hip Pain Descriptors / Indicators: Aching, Constant, Discomfort, Dull, Grimacing, Operative site guarding Pain Intervention(s): Limited activity within patient's tolerance, Monitored during session, Premedicated before session,  Repositioned, Ice applied    Home Living Family/patient expects to be discharged to:: Private residence Living Arrangements: Spouse/significant other Available Help at Discharge: Family Type of Home: House Home Access: Stairs to enter Entrance Stairs-Rails: None Entrance Stairs-Number of Steps: 1 Alternate Level Stairs-Number of Steps: 14 Home Layout: Two  level;Bed/bath upstairs Home Equipment: Agricultural consultant (2 wheels);BSC/3in1      Prior Function Prior Level of Function : Independent/Modified Independent             Mobility Comments: pt reports self limting driving due to pain prior to surgery, IND all ADLs, self care tasks and IADLs       Extremity/Trunk Assessment        Lower Extremity Assessment Lower Extremity Assessment: RLE deficits/detail RLE Deficits / Details: ankle DF/PF 5/5 RLE Sensation: WNL    Cervical / Trunk Assessment Cervical / Trunk Assessment: Back Surgery  Communication   Communication Communication: No apparent difficulties    Cognition Arousal: Alert Behavior During Therapy: WFL for tasks assessed/performed   PT - Cognitive impairments: No apparent impairments                         Following commands: Intact       Cueing       General Comments      Exercises Total Joint Exercises Ankle Circles/Pumps: AROM, 10 reps, Both   Assessment/Plan    PT Assessment Patient needs continued PT services  PT Problem List Decreased strength;Decreased range of motion;Decreased balance;Decreased activity tolerance;Decreased mobility;Decreased coordination;Pain       PT Treatment Interventions DME instruction;Gait training;Stair training;Functional mobility training;Therapeutic activities;Therapeutic exercise;Balance training;Neuromuscular re-education;Patient/family education;Modalities    PT Goals (Current goals can be found in the Care Plan section)  Acute Rehab PT Goals Patient Stated Goal: to be able to no longer have back and R hip pain and be able to go up and down the steps PT Goal Formulation: With patient Time For Goal Achievement: 08/28/23 Potential to Achieve Goals: Good    Frequency 7X/week     Co-evaluation               AM-PAC PT 6 Clicks Mobility  Outcome Measure Help needed turning from your back to your side while in a flat bed without using  bedrails?: A Little Help needed moving from lying on your back to sitting on the side of a flat bed without using bedrails?: A Lot Help needed moving to and from a bed to a chair (including a wheelchair)?: A Little Help needed standing up from a chair using your arms (e.g., wheelchair or bedside chair)?: A Little Help needed to walk in hospital room?: A Little Help needed climbing 3-5 steps with a railing? : A Lot 6 Click Score: 16    End of Session Equipment Utilized During Treatment: Gait belt Activity Tolerance: Patient tolerated treatment well;No increased pain Patient left: in chair;with call bell/phone within reach Nurse Communication: Mobility status PT Visit Diagnosis: Unsteadiness on feet (R26.81);Other abnormalities of gait and mobility (R26.89);Muscle weakness (generalized) (M62.81);Difficulty in walking, not elsewhere classified (R26.2);Pain Pain - Right/Left: Right Pain - part of body: Hip;Leg    Time: 1610-9604 PT Time Calculation (min) (ACUTE ONLY): 21 min   Charges:   PT Evaluation $PT Eval Low Complexity: 1 Low   PT General Charges $$ ACUTE PT VISIT: 1 Visit         Cary Clarks, PT Acute Rehab   Annalee Kiang 08/14/2023, 7:38 PM

## 2023-08-15 ENCOUNTER — Encounter (HOSPITAL_COMMUNITY): Payer: Self-pay | Admitting: Orthopedic Surgery

## 2023-08-15 DIAGNOSIS — Z91040 Latex allergy status: Secondary | ICD-10-CM | POA: Diagnosis not present

## 2023-08-15 DIAGNOSIS — Z79899 Other long term (current) drug therapy: Secondary | ICD-10-CM | POA: Diagnosis not present

## 2023-08-15 DIAGNOSIS — M1611 Unilateral primary osteoarthritis, right hip: Secondary | ICD-10-CM | POA: Diagnosis not present

## 2023-08-15 DIAGNOSIS — Z96651 Presence of right artificial knee joint: Secondary | ICD-10-CM | POA: Diagnosis not present

## 2023-08-15 DIAGNOSIS — Z96611 Presence of right artificial shoulder joint: Secondary | ICD-10-CM | POA: Diagnosis not present

## 2023-08-15 DIAGNOSIS — Z85828 Personal history of other malignant neoplasm of skin: Secondary | ICD-10-CM | POA: Diagnosis not present

## 2023-08-15 LAB — CBC
HCT: 31.8 % — ABNORMAL LOW (ref 36.0–46.0)
Hemoglobin: 10.3 g/dL — ABNORMAL LOW (ref 12.0–15.0)
MCH: 30.9 pg (ref 26.0–34.0)
MCHC: 32.4 g/dL (ref 30.0–36.0)
MCV: 95.5 fL (ref 80.0–100.0)
Platelets: 240 10*3/uL (ref 150–400)
RBC: 3.33 MIL/uL — ABNORMAL LOW (ref 3.87–5.11)
RDW: 13.6 % (ref 11.5–15.5)
WBC: 9.4 10*3/uL (ref 4.0–10.5)
nRBC: 0 % (ref 0.0–0.2)

## 2023-08-15 LAB — BASIC METABOLIC PANEL WITH GFR
Anion gap: 8 (ref 5–15)
BUN: 21 mg/dL (ref 8–23)
CO2: 25 mmol/L (ref 22–32)
Calcium: 9.4 mg/dL (ref 8.9–10.3)
Chloride: 106 mmol/L (ref 98–111)
Creatinine, Ser: 1.03 mg/dL — ABNORMAL HIGH (ref 0.44–1.00)
GFR, Estimated: 57 mL/min — ABNORMAL LOW (ref >60)
Glucose, Bld: 126 mg/dL — ABNORMAL HIGH (ref 70–99)
Potassium: 3.7 mmol/L (ref 3.5–5.1)
Sodium: 139 mmol/L (ref 135–145)

## 2023-08-15 MED ORDER — HYDROCODONE-ACETAMINOPHEN 5-325 MG PO TABS: 1.0000 | ORAL_TABLET | Freq: Four times a day (QID) | ORAL | 0 refills | Status: DC | PRN

## 2023-08-15 MED ORDER — ASPIRIN 81 MG PO CHEW: 81.0000 mg | CHEWABLE_TABLET | Freq: Two times a day (BID) | ORAL | 0 refills | Status: AC

## 2023-08-15 MED ORDER — METHOCARBAMOL 500 MG PO TABS: 500.0000 mg | ORAL_TABLET | Freq: Four times a day (QID) | ORAL | 0 refills | Status: DC | PRN

## 2023-08-15 MED ORDER — ONDANSETRON HCL 4 MG PO TABS: 4.0000 mg | ORAL_TABLET | Freq: Four times a day (QID) | ORAL | 0 refills | Status: DC | PRN

## 2023-08-15 NOTE — Care Management Obs Status (Signed)
 MEDICARE OBSERVATION STATUS NOTIFICATION   Patient Details  Name: Tracy [name] MRN: [id number] Date of Birth: Dec 14, 1950   Medicare Observation Status Notification Given:  Yes    Delilah Fend, LCSW 08/15/2023, 10:40 AM

## 2023-08-15 NOTE — Progress Notes (Signed)
   Subjective: 1 Day Post-Op Procedure(s) (LRB): ARTHROPLASTY, HIP, TOTAL, ANTERIOR APPROACH (Right) Patient seen in rounds by Dr. France Ina. Patient is well, and has had no acute complaints or problems. Denies SOB or chest pain. Denies calf pain. Foley cath removed this AM. Patient reports pain as moderate. Worked with physical therapy yesterday and ambulated 25'. We will continue physical therapy today.   Objective: Vital signs in last 24 hours: Temp:  [97.5 F (36.4 C)-98.7 F (37.1 C)] 98.7 F (37.1 C) (06/17 0545) Pulse Rate:  [60-76] 76 (06/17 0545) Resp:  [11-22] 18 (06/17 0545) BP: (102-160)/(49-88) 118/62 (06/17 0545) SpO2:  [93 %-100 %] 97 % (06/17 0545) Weight:  [69 kg] 69 kg (06/16 0944)  Intake/Output from previous day:  Intake/Output Summary (Last 24 hours) at 08/15/2023 0713 Last data filed at 08/15/2023 0554 Gross per 24 hour  Intake 2392.91 ml  Output 1450 ml  Net 942.91 ml     Intake/Output this shift: No intake/output data recorded.  Labs: Recent Labs    08/15/23 0341  HGB 10.3*   Recent Labs    08/15/23 0341  WBC 9.4  RBC 3.33*  HCT 31.8*  PLT 240   Recent Labs    08/15/23 0341  NA 139  K 3.7  CL 106  CO2 25  BUN 21  CREATININE 1.03*  GLUCOSE 126*  CALCIUM  9.4   No results for input(s): LABPT, INR in the last 72 hours.  Exam: General - Patient is Alert and Oriented Extremity - Neurologically intact Neurovascular intact Sensation intact distally Dorsiflexion/Plantar flexion intact Dressing - dressing C/D/I Motor Function - intact, moving foot and toes well on exam.  Past Medical History:  Diagnosis Date   Allergy    Anxiety    Arthritis    Bursitis of left hip    C. difficile diarrhea    Cancer (HCC)    basil cell   Cataract    bilateral   Complication of anesthesia    Depression    Diverticulitis    GERD (gastroesophageal reflux disease)    Hx: UTI (urinary tract infection)    Hyperlipidemia    Hyperthyroidism     Osteopenia    Osteoporosis    PONV (postoperative nausea and vomiting)    Rosacea    Rotator cuff arthropathy, left     Assessment/Plan: 1 Day Post-Op Procedure(s) (LRB): ARTHROPLASTY, HIP, TOTAL, ANTERIOR APPROACH (Right) Principal Problem:   OA (osteoarthritis) of hip Active Problems:   Primary osteoarthritis of right hip  Estimated body mass index is 26.11 kg/m as calculated from the following:   Height as of this encounter: 5' 4 (1.626 m).   Weight as of this encounter: 69 kg. Advance diet Up with therapy D/C IV fluids  DVT Prophylaxis - Aspirin  Weight bearing as tolerated.  Continue physical therapy. Expected discharge home today pending progress with physical therapy and if meeting patient goals. Will do HEP once discharged. Follow-up in clinic in 2 weeks.  The PDMP database was reviewed today prior to any opioid medications being prescribed to this patient.  R. Brinton Canavan, PA-C Orthopedic Surgery 08/15/2023, 7:13 AM

## 2023-08-15 NOTE — Plan of Care (Signed)
  Problem: Education: Goal: Knowledge of General Education information will improve Description: Including pain rating scale, medication(s)/side effects and non-pharmacologic comfort measures Outcome: Progressing   Problem: Clinical Measurements: Goal: Diagnostic test results will improve Outcome: Progressing   Problem: Activity: Goal: Risk for activity intolerance will decrease Outcome: Progressing   Problem: Elimination: Goal: Will not experience complications related to urinary retention Outcome: Progressing   Problem: Pain Managment: Goal: General experience of comfort will improve and/or be controlled Outcome: Progressing

## 2023-08-15 NOTE — Progress Notes (Signed)
 Physical Therapy Treatment Patient Details Name: Tracy [name] MRN: [id number] DOB: [birth year] Today's Date: 08/15/2023   History of Present Illness 73 yo female presents to therapy s/p R THA, anterior approach on 08/14/2023 due to failure of conservative measures. Pt PMH Includes but is not limited to: B LE pain, OA, DOE, R TSA, L hip trochanteric bursitis s/p bursectomy and glute tendon repair, lumbar surgery, R foot surgery, HLD, GERD, diverticulitis, GAD, and R TKA.    PT Comments  Pt progressing well this session.amb ~ 100' with RW and CGA for safety. Husband present for session, supportive and will be assisting at home. Will see for a second session and pt should be ready to d/c later today     If plan is discharge home, recommend the following: A little help with walking and/or transfers;A little help with bathing/dressing/bathroom;Assistance with cooking/housework;Help with stairs or ramp for entrance;Assist for transportation   Can travel by private vehicle        Equipment Recommendations  None recommended by PT    Recommendations for Other Services       Precautions / Restrictions Precautions Precautions: Fall Restrictions Weight Bearing Restrictions Per Provider Order: No     Mobility  Bed Mobility               General bed mobility comments: pt in recliner and returned to same    Transfers Overall transfer level: Needs assistance Equipment used: Rolling walker (2 wheels) Transfers: Sit to/from Stand Sit to Stand: Min assist, Contact guard assist           General transfer comment: cues for hand placement and RLE position    Ambulation/Gait Ambulation/Gait assistance: Contact guard assist Gait Distance (Feet): 100 Feet Assistive device: Rolling walker (2 wheels) Gait Pattern/deviations: Step-to pattern, Decreased stance time - right, Antalgic Gait velocity: decreased     General Gait Details: slow cadence, B UE support at RW to offload R LE  in stance phase, min cues for posture and proper distance from [employer]             Wheelchair Mobility     Tilt Bed    Modified Rankin (Stroke Patients Only)       Balance Overall balance assessment: Needs assistance Sitting-balance support: Feet supported Sitting balance-Leahy Scale: Good     Standing balance support: Bilateral upper extremity supported, During functional activity, Reliant on assistive device for balance Standing balance-Leahy Scale: Poor                              Communication Communication Communication: No apparent difficulties  Cognition Arousal: Alert Behavior During Therapy: WFL for tasks assessed/performed   PT - Cognitive impairments: No apparent impairments                         Following commands: Intact      Cueing    Exercises Total Joint Exercises Ankle Circles/Pumps: AROM, 10 reps, Both Quad Sets: AROM, Both, 10 reps Heel Slides: Right, 10 reps, AAROM Hip ABduction/ADduction: AAROM, Right, 10 reps    General Comments        Pertinent Vitals/Pain Pain Assessment Pain Assessment: 0-10 Pain Score: 5  Pain Location: R hip Pain Descriptors / Indicators: Aching, Discomfort, Grimacing, Operative site guarding, Burning Pain Intervention(s): Limited activity within patient's tolerance, Monitored during session, Premedicated before session, Repositioned    Home Living  Prior Function            PT Goals (current goals can now be found in the care plan section) Acute Rehab PT Goals Patient Stated Goal: to be able to no longer have back and R hip pain and be able to go up and down the steps PT Goal Formulation: With patient Time For Goal Achievement: 08/28/23 Potential to Achieve Goals: Good Progress towards PT goals: Progressing toward goals    Frequency    7X/week      PT Plan      Co-evaluation              AM-PAC PT 6 Clicks  Mobility   Outcome Measure  Help needed turning from your back to your side while in a flat bed without using bedrails?: A Little Help needed moving from lying on your back to sitting on the side of a flat bed without using bedrails?: A Lot Help needed moving to and from a bed to a chair (including a wheelchair)?: A Little Help needed standing up from a chair using your arms (e.g., wheelchair or bedside chair)?: A Little Help needed to walk in hospital room?: A Little Help needed climbing 3-5 steps with a railing? : A Lot 6 Click Score: 16    End of Session Equipment Utilized During Treatment: Gait belt Activity Tolerance: Patient tolerated treatment well;No increased pain Patient left: in chair;with call bell/phone within reach;with family/visitor present Nurse Communication: Mobility status PT Visit Diagnosis: Unsteadiness on feet (R26.81);Other abnormalities of gait and mobility (R26.89);Muscle weakness (generalized) (M62.81);Difficulty in walking, not elsewhere classified (R26.2);Pain Pain - Right/Left: Right Pain - part of body: Hip;Leg     Time: 1136-1200 PT Time Calculation (min) (ACUTE ONLY): 24 min  Charges:    $Gait Training: 8-22 mins $Therapeutic Exercise: 8-22 mins PT General Charges $$ ACUTE PT VISIT: 1 Visit                     Nehemie Casserly, PT  Acute Rehab Dept [facility]) [phone]  08/15/2023    [facility] 08/15/2023, 12:15 PM

## 2023-08-15 NOTE — Progress Notes (Signed)
 PT TX NOTE  08/15/23 1500  PT Visit Information  Last PT Received On 08/15/23  Assistance Needed Pt progressing very well, meeting goals and is ready to d/c with family assist prn from PT standpoint. THA HEP and progression reviewed. Discussed taking  rest period after entering home, prior to going up flight of stairs later to bedroomtonight  History of Present Illness 73 yo female presents to therapy s/p R THA, anterior approach on 08/14/2023 due to failure of conservative measures. Pt PMH Includes but is not limited to: B LE pain, OA, DOE, R TSA, L hip trochanteric bursitis s/p bursectomy and glute tendon repair, lumbar surgery, R foot surgery, HLD, GERD, diverticulitis, GAD, and R TKA.  Subjective Data  Patient Stated Goal to be able to no longer have back and R hip pain and be able to go up and down the steps  Precautions  Precautions Fall  Restrictions  Weight Bearing Restrictions Per Provider Order No  Pain Assessment  Pain Location R hip  Pain Descriptors / Indicators Aching;Discomfort;Grimacing;Operative site guarding;Burning  Cognition  Arousal Alert  Behavior During Therapy [facility] for tasks assessed/performed  PT - Cognitive impairments No apparent impairments  Following Commands  Following commands Intact  Communication  Communication No apparent difficulties  Bed Mobility  General bed mobility comments pt in recliner and returned to same  Transfers  Overall transfer level Needs assistance  Equipment used Rolling walker (2 wheels)  Transfers Sit to/from Stand  Sit to Stand Min assist;Contact guard assist  General transfer comment cues for hand placement and RLE position. pt uses momentum and needs incr time  Ambulation/Gait  Ambulation/Gait assistance Contact guard assist;Supervision  Gait Distance (Feet) 50 Feet  Assistive device Rolling walker (2 wheels)  Gait Pattern/deviations Step-to pattern;Decreased stance time - right  General Gait Details slow cadence, B UE support  at RW to offload R LE in stance phase, min cues for posture and proper distance from RW. steady, no LOB  Gait velocity decreased  Stairs Yes  Stairs assistance Contact guard assist  Stair Management Two rails;Step to pattern;Forwards  Number of Stairs 3 (x2)  General stair comments cues for sequence and technique, good stability, no LOB  Balance  Overall balance assessment Needs assistance  Sitting-balance support Feet supported  Sitting balance-Leahy Scale Good  Standing balance support Bilateral upper extremity supported;During functional activity;Reliant on assistive device for balance  Standing balance-Leahy Scale Poor  PT - End of Session  Equipment Utilized During Treatment Gait belt  Activity Tolerance Patient tolerated treatment well;No increased pain  Patient left in chair;with call bell/phone within reach  Nurse Communication Mobility status   PT - Assessment/Plan  PT Visit Diagnosis Unsteadiness on feet (R26.81);Other abnormalities of gait and mobility (R26.89);Muscle weakness (generalized) (M62.81);Difficulty in walking, not elsewhere classified (R26.2);Pain  Pain - Right/Left Right  Pain - part of body Hip;Leg  PT Frequency (ACUTE ONLY) 7X/week  Follow Up Recommendations Follow physician's recommendations for discharge plan and follow up therapies  Patient can return home with the following A little help with walking and/or transfers;A little help with bathing/dressing/bathroom;Assistance with cooking/housework;Help with stairs or ramp for entrance;Assist for transportation  PT equipment None recommended by PT  AM-PAC PT 6 Clicks Mobility Outcome Measure (Version 2)  Help needed turning from your back to your side while in a flat bed without using bedrails? 3  Help needed moving from lying on your back to sitting on the side of a flat bed without using bedrails? 3  Help  needed moving to and from a bed to a chair (including a wheelchair)? 3  Help needed standing up from a  chair using your arms (e.g., wheelchair or bedside chair)? 3  Help needed to walk in hospital room? 3  Help needed climbing 3-5 steps with a railing?  2  6 Click Score 17  Consider Recommendation of Discharge To: Home with [facility]  Progressive Mobility  What is the highest level of mobility based on the progressive mobility assessment? Level 5 (Walks with assist in room/hall) - Balance while stepping forward/back and can walk in room with assist - Complete  Acute Rehab PT Goals  PT Goal Formulation With patient  Time For Goal Achievement 08/28/23  Potential to Achieve Goals Good  PT Time Calculation  PT Start Time (ACUTE ONLY) 1440  PT Stop Time (ACUTE ONLY) 1503  PT Time Calculation (min) (ACUTE ONLY) 23 min  PT General Charges  $$ ACUTE PT VISIT 1 Visit  PT Treatments  $Gait Training 23-37 mins

## 2023-08-15 NOTE — TOC Transition Note (Signed)
 Transition of Care [facility]) - Discharge Note   Patient Details  Name: Tracy [name] MRN: [id number] Date of Birth: Aug 15, 1950  [facility] CM/SW Contact:  Delilah Fend, LCSW Phone Number: 08/15/2023, 10:10 AM   Clinical Narrative:     Met with pt who confirms she has needed DME in the home.  Plan for HEP.  No further TOC needs.  Final next level of care: Home/Self Care Barriers to Discharge: No Barriers Identified   Patient Goals and CMS Choice Patient states their goals for this hospitalization and ongoing recovery are:: return home          Discharge Placement                       Discharge Plan and Services Additional resources added to the After Visit Summary for                  DME Arranged: N/A                    Social Drivers of Health (SDOH) Interventions SDOH Screenings   Food Insecurity: No Food Insecurity (08/14/2023)  Housing: Low Risk  (08/14/2023)  Transportation Needs: No Transportation Needs (08/14/2023)  Utilities: Not At Risk (08/14/2023)  Alcohol Screen: Low Risk  (06/29/2022)  Depression (PHQ2-9): Low Risk  (04/11/2023)  Financial Resource Strain: Low Risk  (07/31/2023)  Physical Activity: Unknown (07/31/2023)  Social Connections: Socially Integrated (08/14/2023)  Stress: No Stress Concern Present (07/31/2023)  Tobacco Use: Low Risk  (08/14/2023)     Readmission Risk Interventions     No data to display

## 2023-08-16 NOTE — Discharge Summary (Signed)
 Physician Discharge Summary   Patient ID: LAMYIAH CRAWSHAW MRN: [id number] DOB/AGE: [birth year] 73 y.o.  Admit date: 08/14/2023 Discharge date: 08/15/2023  Primary Diagnosis: Osteoarthritis of the right hip   Admission Diagnoses:  Past Medical History:  Diagnosis Date   Allergy    Anxiety    Arthritis    Bursitis of left hip    C. difficile diarrhea    Cancer (HCC)    basil cell   Cataract    bilateral   Complication of anesthesia    Depression    Diverticulitis    GERD (gastroesophageal reflux disease)    Hx: UTI (urinary tract infection)    Hyperlipidemia    Hyperthyroidism    Osteopenia    Osteoporosis    PONV (postoperative nausea and vomiting)    Rosacea    Rotator cuff arthropathy, left    Discharge Diagnoses:   Principal Problem:   OA (osteoarthritis) of hip Active Problems:   Primary osteoarthritis of right hip  Estimated body mass index is 26.11 kg/m as calculated from the following:   Height as of this encounter: 5' 4 (1.626 m).   Weight as of this encounter: 69 kg.  Procedure:  Procedure(s) (LRB): ARTHROPLASTY, HIP, TOTAL, ANTERIOR APPROACH (Right)   Consults: None  HPI: Tracy [name] is a 73 y.o. female who has advanced end-stage arthritis of their Right  hip with progressively worsening pain and dysfunction.The patient has failed nonoperative management and presents for total hip arthroplasty.   Laboratory Data: Admission on 08/14/2023, Discharged on 08/15/2023  Component Date Value Ref Range Status   WBC 08/15/2023 9.4  4.0 - 10.5 K/uL Final   RBC 08/15/2023 3.33 (L)  3.87 - 5.11 MIL/uL Final   Hemoglobin 08/15/2023 10.3 (L)  12.0 - 15.0 g/dL Final   HCT 08/65/7846 31.8 (L)  36.0 - 46.0 % Final   MCV 08/15/2023 95.5  80.0 - 100.0 fL Final   MCH 08/15/2023 30.9  26.0 - 34.0 pg Final   MCHC 08/15/2023 32.4  30.0 - 36.0 g/dL Final   RDW 96/29/5284 13.6  11.5 - 15.5 % Final   Platelets 08/15/2023 240  150 - 400 K/uL Final   nRBC  08/15/2023 0.0  0.0 - 0.2 % Final   Performed at [facility], [address] [place], [place] [address]   Sodium 08/15/2023 139  135 - 145 mmol/L Final   Potassium 08/15/2023 3.7  3.5 - 5.1 mmol/L Final   Chloride 08/15/2023 106  98 - 111 mmol/L Final   CO2 08/15/2023 25  22 - 32 mmol/L Final   Glucose, Bld 08/15/2023 126 (H)  70 - 99 mg/dL Final   Glucose reference range applies only to samples taken after fasting for at least 8 hours.   BUN 08/15/2023 21  8 - 23 mg/dL Final   Creatinine, Ser 08/15/2023 1.03 (H)  0.44 - 1.00 mg/dL Final   Calcium  08/15/2023 9.4  8.9 - 10.3 mg/dL Final   GFR, Estimated 08/15/2023 57 (L)  >60 mL/min Final   Comment: (NOTE) Calculated using the CKD-EPI Creatinine Equation (2021)    Anion gap 08/15/2023 8  5 - 15 Final   Performed at [facility], [address], [place], [place] [address]  Hospital Outpatient Visit on 08/08/2023  Component Date Value Ref Range Status   ABO/RH(D) 08/08/2023 A POS   Final   Antibody Screen 08/08/2023 NEG   Final   Sample Expiration 08/08/2023 08/17/2023,2359   Final  Extend sample reason 08/08/2023    Final                   Value:NO TRANSFUSIONS OR PREGNANCY IN THE PAST 3 MONTHS Performed at [facility], [address] [place], [place] [address]    MRSA, PCR 08/08/2023 NEGATIVE  NEGATIVE Final   Staphylococcus aureus 08/08/2023 POSITIVE (A)  NEGATIVE Final   Comment: (NOTE) The Xpert SA Assay (FDA approved for NASAL specimens in patients 68 years of age and older), is one component of a comprehensive surveillance program. It is not intended to diagnose infection nor to guide or monitor treatment. Performed at [facility], [address] [place], [place] [address]   Office Visit on 08/03/2023  Component Date Value Ref Range Status   Sodium 08/03/2023 143  135 - 145 mEq/L Final   Potassium 08/03/2023 3.9  3.5 - 5.1 mEq/L Final    Chloride 08/03/2023 105  96 - 112 mEq/L Final   CO2 08/03/2023 28  19 - 32 mEq/L Final   Glucose, Bld 08/03/2023 97  70 - 99 mg/dL Final   BUN 09/81/1914 26 (H)  6 - 23 mg/dL Final   Creatinine, Ser 08/03/2023 0.89  0.40 - 1.20 mg/dL Final   GFR 78/29/5621 63.46  >60.00 mL/min Final   Calculated using the CKD-EPI Creatinine Equation (2021)   Calcium  08/03/2023 10.8 (H)  8.4 - 10.5 mg/dL Final  Office Visit on 07/31/2023  Component Date Value Ref Range Status   WBC 07/31/2023 5.6  4.0 - 10.5 K/uL Final   RBC 07/31/2023 4.42  3.87 - 5.11 Mil/uL Final   Hemoglobin 07/31/2023 13.1  12.0 - 15.0 g/dL Final   HCT 30/86/5784 39.3  36.0 - 46.0 % Final   MCV 07/31/2023 89.0  78.0 - 100.0 fl Final   MCHC 07/31/2023 33.3  30.0 - 36.0 g/dL Final   RDW 69/62/9528 13.4  11.5 - 15.5 % Final   Platelets 07/31/2023 244.0  150.0 - 400.0 K/uL Final   Neutrophils Relative % 07/31/2023 62.5  43.0 - 77.0 % Final   Lymphocytes Relative 07/31/2023 25.9  12.0 - 46.0 % Final   Monocytes Relative 07/31/2023 8.4  3.0 - 12.0 % Final   Eosinophils Relative 07/31/2023 2.6  0.0 - 5.0 % Final   Basophils Relative 07/31/2023 0.6  0.0 - 3.0 % Final   Neutro Abs 07/31/2023 3.5  1.4 - 7.7 K/uL Final   Lymphs Abs 07/31/2023 1.5  0.7 - 4.0 K/uL Final   Monocytes Absolute 07/31/2023 0.5  0.1 - 1.0 K/uL Final   Eosinophils Absolute 07/31/2023 0.1  0.0 - 0.7 K/uL Final   Basophils Absolute 07/31/2023 0.0  0.0 - 0.1 K/uL Final   Sodium 07/31/2023 143  135 - 145 mEq/L Final   Potassium 07/31/2023 4.0  3.5 - 5.1 mEq/L Final   Chloride 07/31/2023 102  96 - 112 mEq/L Final   CO2 07/31/2023 33 (H)  19 - 32 mEq/L Final   Glucose, Bld 07/31/2023 94  70 - 99 mg/dL Final   BUN 41/32/4401 22  6 - 23 mg/dL Final   Creatinine, Ser 07/31/2023 1.04  0.40 - 1.20 mg/dL Final   GFR 02/72/5366 52.64 (L)  >60.00 mL/min Final   Calculated using the CKD-EPI Creatinine Equation (2021)   Calcium  07/31/2023 11.6 (H)  8.4 - 10.5 mg/dL Final    Amylase 44/04/4740 34  27 - 131 U/L Final   Lipase 07/31/2023 15.0  11.0 - 59.0  U/L Final   Total Bilirubin 07/31/2023 0.5  0.2 - 1.2 mg/dL Final   Bilirubin, Direct 07/31/2023 0.2  0.0 - 0.3 mg/dL Final   Alkaline Phosphatase 07/31/2023 62  39 - 117 U/L Final   AST 07/31/2023 25  0 - 37 U/L Final   ALT 07/31/2023 12  0 - 35 U/L Final   Total Protein 07/31/2023 6.8  6.0 - 8.3 g/dL Final   Albumin 78/29/5621 4.3  3.5 - 5.2 g/dL Final   Glucose, UA 30/86/5784 Negative  Negative Final   Bilirubin, UA 07/31/2023 neg   Final   Ketones, UA 07/31/2023 neg   Final   Spec Grav, UA 07/31/2023 1.025  1.010 - 1.025 Final   Blood, UA 07/31/2023 neg   Final   pH, UA 07/31/2023 6.0  5.0 - 8.0 Final   Protein, UA 07/31/2023 Positive (A)  Negative Final   Urobilinogen, UA 07/31/2023 0.2  0.2 or 1.0 E.U./dL Final   Nitrite, UA 69/62/9528 neg   Final   Leukocytes, UA 07/31/2023 Negative  Negative Final  Office Visit on 07/07/2023  Component Date Value Ref Range Status   SARS Coronavirus 2 Ag 07/07/2023 Positive (A)  Negative Final   Influenza A, POC 07/07/2023 Negative  Negative Final   Influenza B, POC 07/07/2023 Negative  Negative Final  Hospital Outpatient Visit on 06/27/2023  Component Date Value Ref Range Status   WBC 06/27/2023 4.8  4.0 - 10.5 K/uL Final   RBC 06/27/2023 4.15  3.87 - 5.11 MIL/uL Final   Hemoglobin 06/27/2023 12.4  12.0 - 15.0 g/dL Final   HCT 41/32/4401 39.6  36.0 - 46.0 % Final   MCV 06/27/2023 95.4  80.0 - 100.0 fL Final   MCH 06/27/2023 29.9  26.0 - 34.0 pg Final   MCHC 06/27/2023 31.3  30.0 - 36.0 g/dL Final   RDW 02/72/5366 12.4  11.5 - 15.5 % Final   Platelets 06/27/2023 233  150 - 400 K/uL Final   nRBC 06/27/2023 0.0  0.0 - 0.2 % Final   Performed at [facility], [address], [place], [place] [address]   MRSA, PCR 06/27/2023 NEGATIVE  NEGATIVE Final   Staphylococcus aureus 06/27/2023 POSITIVE (A)  NEGATIVE Final   Comment: (NOTE) The Xpert SA  Assay (FDA approved for NASAL specimens in patients 43 years of age and older), is one component of a comprehensive surveillance program. It is not intended to diagnose infection nor to guide or monitor treatment. Performed at [facility], [address] [place], [place] [address]    ABO/RH(D) 06/27/2023 A POS   Final   Antibody Screen 06/27/2023 NEG   Final   Sample Expiration 06/27/2023 07/11/2023,2359   Final   Extend sample reason 06/27/2023    Final                   Value:NO TRANSFUSIONS OR PREGNANCY IN THE PAST 3 MONTHS Performed at [facility], [address], [place], [place] [address]    Sodium 06/27/2023 142  135 - 145 mmol/L Final   Potassium 06/27/2023 3.7  3.5 - 5.1 mmol/L Final   Chloride 06/27/2023 105  98 - 111 mmol/L Final   CO2 06/27/2023 30  22 - 32 mmol/L Final   Glucose, Bld 06/27/2023 87  70 - 99 mg/dL Final   Glucose reference range applies only to samples taken after fasting for at least 8 hours.   BUN 06/27/2023 31 (H)  8 -  23 mg/dL Final   Creatinine, Ser 06/27/2023 0.89  0.44 - 1.00 mg/dL Final   Calcium  06/27/2023 10.4 (H)  8.9 - 10.3 mg/dL Final   GFR, Estimated 06/27/2023 >60  >60 mL/min Final   Comment: (NOTE) Calculated using the CKD-EPI Creatinine Equation (2021)    Anion gap 06/27/2023 7  5 - 15 Final   Performed at [facility], [address], [place], [place] [address]     X-Rays:DG Pelvis Portable Result Date: 08/14/2023 CLINICAL DATA:  Status post hip replacement. EXAM: PORTABLE PELVIS 1-2 VIEWS COMPARISON:  06/06/2023. FINDINGS: Status post right total hip arthroplasty. The acetabular and femoral components appear well seated with appropriate alignment. No acute fracture or dislocation. Expected right hip postoperative soft tissue swelling and soft tissue air. Moderate osteoarthritis of the left hip with suture anchors along the left greater trochanter. IMPRESSION: Status post right  total hip arthroplasty with appropriate alignment. No evidence of acute complication. Electronically Signed   By: Mannie Seek M.D.   On: 08/14/2023 16:08   DG HIP PORT UNILAT WITH PELVIS 1V RIGHT Result Date: 08/14/2023 CLINICAL DATA:  [id number] Surgery, elective [id number] EXAM: DG HIP (WITH OR WITHOUT PELVIS) 1V PORT RIGHT COMPARISON:  06/06/2023. FINDINGS: Multiple intraoperative fluoroscopic spot images are provided. Interval right total hip arthroplasty. Normal alignment. Total fluoroscopy time: 6 seconds Total dose: Radiation Exposure Index (as provided by the fluoroscopic device): 0.75 mGy air Kerma Please see intraoperative findings for further detail. IMPRESSION: Intraoperative fluoroscopy, as above. Electronically Signed   By: Mannie Seek M.D.   On: 08/14/2023 16:06   DG C-Arm 1-60 Min-No Report Result Date: 08/14/2023 Fluoroscopy was utilized by the requesting physician.  No radiographic interpretation.   DG Chest 2 View Result Date: 08/06/2023 CLINICAL DATA:  L posterior pleuritic chest pain. EXAM: CHEST - 2 VIEW COMPARISON:  08/28/2003. FINDINGS: Bilateral lung fields are clear. Bilateral costophrenic angles are clear. Normal cardio-mediastinal silhouette. No acute osseous abnormalities. Right reverse shoulder arthroplasty noted. The soft tissues are within normal limits. There are surgical clips in the right upper quadrant, typical of a previous cholecystectomy. IMPRESSION: No active cardiopulmonary disease. Electronically Signed   By: Beula Brunswick M.D.   On: 08/06/2023 11:40    EKG: Orders placed or performed in visit on 06/13/23   EKG 12-Lead     Hospital Course: Asencion A Lynes is a 73 y.o. who was admitted to [facility]. They were brought to the operating room on 08/14/2023 and underwent Procedure(s): ARTHROPLASTY, HIP, TOTAL, ANTERIOR APPROACH.  Patient tolerated the procedure well and was later transferred to the recovery room and then to the orthopaedic floor  for postoperative care. They were given PO and IV analgesics for pain control following their surgery. They were given 24 hours of postoperative antibiotics of  Anti-infectives (From admission, onward)    Start     Dose/Rate Route Frequency Ordered Stop   08/14/23 1800  ceFAZolin  (ANCEF ) IVPB 2g/100 mL premix        2 g 200 mL/hr over 30 Minutes Intravenous Every 6 hours 08/14/23 1453 08/15/23 0025   08/14/23 0915  ceFAZolin  (ANCEF ) IVPB 2g/100 mL premix        2 g 200 mL/hr over 30 Minutes Intravenous On call to O.R. 08/14/23 4782 08/14/23 1134     and started on DVT prophylaxis in the form of Aspirin .   PT and OT were ordered for total joint protocol. Discharge planning consulted to help with postop disposition and equipment  needs.  Patient had a good night on the evening of surgery. They started to get up OOB with therapy on POD #0. Pt was seen during rounds and was ready to go home pending progress with therapy. She worked with therapy on POD #1 and was meeting her goals. Pt was discharged to home later that day in stable condition.  Diet: Regular diet Activity: WBAT Follow-up: in 2 weeks Disposition: Home Discharged Condition: stable   Discharge Instructions     Call MD / Call 911   Complete by: As directed    If you experience chest pain or shortness of breath, CALL 911 and be transported to the hospital emergency room.  If you develope a fever above 101 F, pus (white drainage) or increased drainage or redness at the wound, or calf pain, call your surgeon's office.   Change dressing   Complete by: As directed    You have an adhesive waterproof bandage over the incision. Leave this in place until your first follow-up appointment. Once you remove this you will not need to place another bandage.   Constipation Prevention   Complete by: As directed    Drink plenty of fluids.  Prune juice may be helpful.  You may use a stool softener, such as Colace (over the counter) 100 mg twice a  day.  Use MiraLax  (over the counter) for constipation as needed.   Diet - low sodium heart healthy   Complete by: As directed    Do not sit on low chairs, stoools or toilet seats, as it may be difficult to get up from low surfaces   Complete by: As directed    Driving restrictions   Complete by: As directed    No driving for two weeks   Post-operative opioid taper instructions:   Complete by: As directed    POST-OPERATIVE OPIOID TAPER INSTRUCTIONS: It is important to wean off of your opioid medication as soon as possible. If you do not need pain medication after your surgery it is ok to stop day one. Opioids include: Codeine, Hydrocodone (Norco, Vicodin), Oxycodone (Percocet, oxycontin ) and hydromorphone  amongst others.  Long term and even short term use of opiods can cause: Increased pain response Dependence Constipation Depression Respiratory depression And more.  Withdrawal symptoms can include Flu like symptoms Nausea, vomiting And more Techniques to manage these symptoms Hydrate well Eat regular healthy meals Stay active Use relaxation techniques(deep breathing, meditating, yoga) Do Not substitute Alcohol to help with tapering If you have been on opioids for less than two weeks and do not have pain than it is ok to stop all together.  Plan to wean off of opioids This plan should start within one week post op of your joint replacement. Maintain the same interval or time between taking each dose and first decrease the dose.  Cut the total daily intake of opioids by one tablet each day Next start to increase the time between doses. The last dose that should be eliminated is the evening dose.      TED hose   Complete by: As directed    Use stockings (TED hose) for three weeks on both leg(s).  You may remove them at night for sleeping.   Weight bearing as tolerated   Complete by: As directed       Allergies as of 08/15/2023       Reactions   Amoxicillin -pot Clavulanate  Diarrhea   Severe diarrhea   Nylon Hives   Demerol [meperidine] Nausea And Vomiting  Latex Rash, Other (See Comments), Hives   Reaction:  Blisters    Other Itching   Nylon sheets, pt prefers cotton sheets    Tramadol  Nausea And Vomiting        Medication List     TAKE these medications    aspirin  81 MG chewable tablet Chew 1 tablet (81 mg total) by mouth 2 (two) times daily for 21 days. Then take one 81 mg aspirin  once a day for three weeks. Then discontinue aspirin .   CALCIUM  600+D PO Take 1 tablet by mouth 2 (two) times daily.   chlorhexidine  4 % external liquid Commonly known as: HIBICLENS  Apply 15 mLs (1 Application total) topically as directed for 30 doses. Use as directed daily for 5 days every other week for 6 weeks.   DULoxetine  20 MG capsule Commonly known as: CYMBALTA  Take 1 capsule (20 mg total) by mouth daily.   fenofibrate  160 MG tablet TAKE 1 TABLET(160 MG) BY MOUTH DAILY   HYDROcodone -acetaminophen  5-325 MG tablet Commonly known as: NORCO/VICODIN Take 1-2 tablets by mouth every 6 (six) hours as needed for severe pain (pain score 7-10).   melatonin 5 MG Tabs Take 7.5 mg by mouth at bedtime.   methocarbamol  500 MG tablet Commonly known as: ROBAXIN  Take 1 tablet (500 mg total) by mouth every 6 (six) hours as needed for muscle spasms.   multivitamin with minerals Tabs tablet Take 1 tablet by mouth daily.   mupirocin ointment 2 % Commonly known as: BACTROBAN Place 1 Application into the nose 2 (two) times daily for 60 doses. Use as directed 2 times daily for 5 days every other week for 6 weeks.   ondansetron  4 MG tablet Commonly known as: ZOFRAN  Take 1 tablet (4 mg total) by mouth every 6 (six) hours as needed for nausea.   pantoprazole  40 MG tablet Commonly known as: PROTONIX  Take 1 tablet (40 mg total) by mouth 2 (two) times daily.   polyethylene glycol 17 g packet Commonly known as: MIRALAX  / GLYCOLAX  Take 17 g by mouth daily.   Prolia  60  MG/ML Sosy injection Generic drug: denosumab  Inject 60 mg into the skin every 6 (six) months.   Systane Complete PF 0.6 % Soln Generic drug: Propylene Glycol (PF) Place 1 drop into both eyes 2 (two) times daily.   Vitamin D  50 MCG (2000 UT) tablet Take 2,000 Units by mouth daily.               Discharge Care Instructions  (From admission, onward)           Start     Ordered   08/15/23 0000  Weight bearing as tolerated        08/15/23 0715   08/15/23 0000  Change dressing       Comments: You have an adhesive waterproof bandage over the incision. Leave this in place until your first follow-up appointment. Once you remove this you will not need to place another bandage.   08/15/23 0715            Follow-up Information     Liliane Rei, MD. Go on 08/29/2023.   Specialty: Orthopedic Surgery Why: You are scheduled for first post op appt on Tuesday July 1 at 2:15pm. Contact information: [address] [place] [address] [phone]                 Signed: R. Brinton Canavan, PA-C Orthopedic Surgery 08/16/2023, 12:18 PM

## 2023-09-05 ENCOUNTER — Other Ambulatory Visit: Payer: Self-pay

## 2023-09-05 DIAGNOSIS — M2550 Pain in unspecified joint: Secondary | ICD-10-CM | POA: Insufficient documentation

## 2023-09-05 DIAGNOSIS — M76899 Other specified enthesopathies of unspecified lower limb, excluding foot: Secondary | ICD-10-CM | POA: Insufficient documentation

## 2023-09-11 NOTE — Telephone Encounter (Signed)
 Placed order for CPAP supplies

## 2023-09-14 NOTE — Telephone Encounter (Signed)
 Order has been placed for CPAP supplies. Mychart message has been sent to the patient. Nothing else further needed.

## 2023-09-21 NOTE — Progress Notes (Signed)
 This encounter was created in error - please disregard.

## 2023-09-26 DIAGNOSIS — Z5189 Encounter for other specified aftercare: Secondary | ICD-10-CM | POA: Diagnosis not present

## 2023-09-26 DIAGNOSIS — M1712 Unilateral primary osteoarthritis, left knee: Secondary | ICD-10-CM | POA: Diagnosis not present

## 2023-10-02 NOTE — Progress Notes (Unsigned)
 10/03/23- 75 yoF never smoker followed for OSA, formerly with Dr Shellia, now establishing with me. HST  06/17/20 >> AHI 64.6, SpO2 low 65%, body weight 147 lbs CPAP auto 5-8 / [facility]    new April, 2022 Download compliance- Body weight today Had R hip replacement in June.

## 2023-10-03 ENCOUNTER — Encounter: Payer: Self-pay | Admitting: Family Medicine

## 2023-10-03 ENCOUNTER — Ambulatory Visit: Admitting: Internal Medicine

## 2023-10-03 ENCOUNTER — Encounter: Payer: Self-pay | Admitting: Internal Medicine

## 2023-10-03 VITALS — BP 131/76 | HR 72 | Temp 97.8°F | Ht 64.0 in | Wt 155.8 lb

## 2023-10-03 DIAGNOSIS — J449 Chronic obstructive pulmonary disease, unspecified: Secondary | ICD-10-CM

## 2023-10-03 MED ORDER — ROPINIROLE HCL 0.25 MG PO TABS: 0.2500 mg | ORAL_TABLET | Freq: Three times a day (TID) | ORAL | 1 refills | Status: DC

## 2023-10-03 NOTE — Patient Instructions (Signed)
 Script sent for requip - try 1 before bed. See if it helps the leg cramps.  We can continue CPAP auto 5-8  Please call if we can help

## 2023-10-04 ENCOUNTER — Other Ambulatory Visit (HOSPITAL_COMMUNITY): Payer: Self-pay

## 2023-10-04 ENCOUNTER — Telehealth: Payer: Self-pay

## 2023-10-04 ENCOUNTER — Other Ambulatory Visit: Payer: Self-pay

## 2023-10-04 DIAGNOSIS — M47816 Spondylosis without myelopathy or radiculopathy, lumbar region: Secondary | ICD-10-CM | POA: Diagnosis not present

## 2023-10-04 DIAGNOSIS — M1711 Unilateral primary osteoarthritis, right knee: Secondary | ICD-10-CM | POA: Diagnosis not present

## 2023-10-04 DIAGNOSIS — M1712 Unilateral primary osteoarthritis, left knee: Secondary | ICD-10-CM | POA: Diagnosis not present

## 2023-10-04 DIAGNOSIS — M810 Age-related osteoporosis without current pathological fracture: Secondary | ICD-10-CM

## 2023-10-04 MED ORDER — DENOSUMAB 60 MG/ML ~~LOC~~ SOSY
60.0000 mg | PREFILLED_SYRINGE | SUBCUTANEOUS | Status: AC
Start: 2023-10-09 — End: ?
  Administered 2023-10-09 (×2): 60 mg via SUBCUTANEOUS

## 2023-10-04 NOTE — Telephone Encounter (Signed)
 SABRA

## 2023-10-04 NOTE — Telephone Encounter (Signed)
 Pt ready for scheduling for PROLIA  on or after : 10/04/23  Option# 1: Buy/Bill (Office supplied medication)  Out-of-pocket cost due at time of clinic visit: $362  Number of injection/visits approved: 2  Primary: [employer] MEDICARE Prolia  co-insurance: 20% Admin fee co-insurance: $30  Secondary: --- Prolia  co-insurance:  Admin fee co-insurance:   Medical Benefit Details: Date Benefits were checked: 10/04/23 Deductible: NO/ Coinsurance: 20%/ Admin Fee: $30  Prior Auth: APPROVED PA# [id number] Expiration Date: 10/04/23-10/03/24  # of doses approved: 2 ----------------------------------------------------------------------- Option# 2- Med Obtained from pharmacy:  Pharmacy benefit: Copay $300 (Paid to pharmacy) Admin Fee: $30 (Pay at clinic)  Prior Auth: N/A PA# Expiration Date:   # of doses approved:   If patient wants fill through the pharmacy benefit please send prescription to: [facility], and include estimated need by date in rx notes. Pharmacy will ship medication directly to the office.  Patient NOT eligible for Prolia  Copay Card. Copay Card can make patient's cost as little as $25. Link to apply: [email or web address]://[email or web address].[email or web address]/copay  ** This summary of benefits is an estimation of the patient's out-of-pocket cost. Exact cost may very based on individual plan coverage.

## 2023-10-04 NOTE — Telephone Encounter (Signed)
 error

## 2023-10-04 NOTE — Telephone Encounter (Signed)
 Prolia VOB initiated via [email or web address]  Next Prolia inj DUE: NOW

## 2023-10-06 ENCOUNTER — Other Ambulatory Visit: Payer: Self-pay

## 2023-10-06 ENCOUNTER — Encounter (HOSPITAL_COMMUNITY): Payer: Self-pay

## 2023-10-06 ENCOUNTER — Other Ambulatory Visit (HOSPITAL_COMMUNITY): Payer: Self-pay

## 2023-10-06 MED ORDER — DENOSUMAB 60 MG/ML ~~LOC~~ SOSY
60.0000 mg | PREFILLED_SYRINGE | SUBCUTANEOUS | 1 refills | Status: DC
  Filled 2023-10-06: qty 1, 180d supply, fill #0

## 2023-10-06 NOTE — Progress Notes (Signed)
 Patient to be enrolled with [facility]. Routed to Tiffany.

## 2023-10-06 NOTE — Telephone Encounter (Signed)
 PA shows patient will need to pay $362 at the time of receiving her injection. Please call her and let her know or I can do it Monday when she checks in

## 2023-10-06 NOTE — Telephone Encounter (Signed)
 Placed order for Porlia. Hoping to have it delivered before patients appt 8/11. Patient is aware it might not be here before appt Monday.

## 2023-10-06 NOTE — Telephone Encounter (Signed)
 Called patient and let her know the different options she had for getting her prolia .  Patient decided to go with getting it from [facility]. I will send the medication to [facility] outpatient pharmacy.

## 2023-10-09 ENCOUNTER — Ambulatory Visit: Payer: Medicare Other | Admitting: Family Medicine

## 2023-10-09 ENCOUNTER — Other Ambulatory Visit: Payer: Self-pay

## 2023-10-09 ENCOUNTER — Telehealth: Payer: Self-pay

## 2023-10-09 ENCOUNTER — Encounter: Payer: Self-pay | Admitting: Family Medicine

## 2023-10-09 VITALS — BP 134/80 | HR 64 | Temp 98.2°F | Ht 64.0 in | Wt 157.4 lb

## 2023-10-09 DIAGNOSIS — E663 Overweight: Secondary | ICD-10-CM | POA: Diagnosis not present

## 2023-10-09 DIAGNOSIS — E781 Pure hyperglyceridemia: Secondary | ICD-10-CM | POA: Diagnosis not present

## 2023-10-09 DIAGNOSIS — M810 Age-related osteoporosis without current pathological fracture: Secondary | ICD-10-CM | POA: Diagnosis not present

## 2023-10-09 LAB — LIPID PANEL
Cholesterol: 169 mg/dL (ref 0–200)
HDL: 69.7 mg/dL (ref >39.00)
LDL Cholesterol: 78 mg/dL (ref 0–99)
NonHDL: 99.31
Total CHOL/HDL Ratio: 2
Triglycerides: 107 mg/dL (ref 0.0–149.0)
VLDL: 21.4 mg/dL (ref 0.0–40.0)

## 2023-10-09 LAB — CBC WITH DIFFERENTIAL/PLATELET
Basophils Absolute: 0 K/uL (ref 0.0–0.1)
Basophils Relative: 0.8 % (ref 0.0–3.0)
Eosinophils Absolute: 0.2 K/uL (ref 0.0–0.7)
Eosinophils Relative: 3.1 % (ref 0.0–5.0)
HCT: 36.6 % (ref 36.0–46.0)
Hemoglobin: 12 g/dL (ref 12.0–15.0)
Lymphocytes Relative: 24.6 % (ref 12.0–46.0)
Lymphs Abs: 1.4 K/uL (ref 0.7–4.0)
MCHC: 32.7 g/dL (ref 30.0–36.0)
MCV: 90.4 fl (ref 78.0–100.0)
Monocytes Absolute: 0.6 K/uL (ref 0.1–1.0)
Monocytes Relative: 9.7 % (ref 3.0–12.0)
Neutro Abs: 3.6 K/uL (ref 1.4–7.7)
Neutrophils Relative %: 61.8 % (ref 43.0–77.0)
Platelets: 232 K/uL (ref 150.0–400.0)
RBC: 4.05 Mil/uL (ref 3.87–5.11)
RDW: 15.2 % (ref 11.5–15.5)
WBC: 5.8 K/uL (ref 4.0–10.5)

## 2023-10-09 LAB — BASIC METABOLIC PANEL WITH GFR
BUN: 23 mg/dL (ref 6–23)
CO2: 32 meq/L (ref 19–32)
Calcium: 10.4 mg/dL (ref 8.4–10.5)
Chloride: 103 meq/L (ref 96–112)
Creatinine, Ser: 0.89 mg/dL (ref 0.40–1.20)
GFR: 63.38 mL/min (ref >60.00)
Glucose, Bld: 100 mg/dL — ABNORMAL HIGH (ref 70–99)
Potassium: 4.4 meq/L (ref 3.5–5.1)
Sodium: 142 meq/L (ref 135–145)

## 2023-10-09 LAB — HEPATIC FUNCTION PANEL
ALT: 13 U/L (ref 0–35)
AST: 22 U/L (ref 0–37)
Albumin: 4 g/dL (ref 3.5–5.2)
Alkaline Phosphatase: 77 U/L (ref 39–117)
Bilirubin, Direct: 0.1 mg/dL (ref 0.0–0.3)
Total Bilirubin: 0.3 mg/dL (ref 0.2–1.2)
Total Protein: 6.6 g/dL (ref 6.0–8.3)

## 2023-10-09 LAB — TSH: TSH: 0.2 u[IU]/mL — ABNORMAL LOW (ref 0.35–5.50)

## 2023-10-09 MED ORDER — DULOXETINE HCL 20 MG PO CPEP: 20.0000 mg | ORAL_CAPSULE | Freq: Every day | ORAL | 3 refills | Status: DC

## 2023-10-09 NOTE — Telephone Encounter (Signed)
 Copied from CRM [id number]. Topic: Clinical - Prescription Issue >> Oct 06, 2023  3:54 PM Deleta RAMAN wrote: Reason for CRM: tiffany p is calling from [facility] medication will not be received on time on Monday for patient. Most likely will be delivered on 8/12 instead >> Oct 06, 2023  3:59 PM Deleta RAMAN wrote: Any questions or concerns contact tiffany at [phone]

## 2023-10-09 NOTE — Progress Notes (Signed)
   Subjective:    Patient ID: Tracy [name], female    DOB: 09/06/1950, 73 y.o.   MRN: [id number]  HPI Hypertriglyceridemia- ongoing issue.  On Fenofibrate  160mg  daily.  No CP, SOB, abd pain.  Will have some nausea due to medication.  Osteoporosis- due for repeat Prolia  injxn.  Pt missed Feb injxn.  Overweight- weight is stable.  BMI 27.  Is no longer walking- doing PT exercises for her hip.   Review of Systems For ROS see HPI     Objective:   Physical Exam Vitals reviewed.  Constitutional:      General: She is not in acute distress.    Appearance: Normal appearance. She is well-developed. She is not ill-appearing.  HENT:     Head: Normocephalic and atraumatic.  Eyes:     Conjunctiva/sclera: Conjunctivae normal.     Pupils: Pupils are equal, round, and reactive to light.  Neck:     Thyroid : No thyromegaly.  Cardiovascular:     Rate and Rhythm: Normal rate and regular rhythm.     Pulses: Normal pulses.     Heart sounds: Normal heart sounds. No murmur heard. Pulmonary:     Effort: Pulmonary effort is normal. No respiratory distress.     Breath sounds: Normal breath sounds.  Abdominal:     General: There is no distension.     Palpations: Abdomen is soft.     Tenderness: There is no abdominal tenderness.  Musculoskeletal:     Cervical back: Normal range of motion and neck supple.     Right lower leg: No edema.     Left lower leg: No edema.  Lymphadenopathy:     Cervical: No cervical adenopathy.  Skin:    General: Skin is warm and dry.  Neurological:     Mental Status: She is alert and oriented to person, place, and time. Mental status is at baseline.  Psychiatric:        Mood and Affect: Mood normal.        Behavior: Behavior normal.        Thought Content: Thought content normal.           Assessment & Plan:

## 2023-10-09 NOTE — Assessment & Plan Note (Signed)
 Weight and BMI are stable.  Encouraged healthy food choices and regular physical activity as able.  Check labs to risk stratify.  Will follow.

## 2023-10-09 NOTE — Assessment & Plan Note (Signed)
 Chronic problem.  Check Vit D level.  Prolia  due today.

## 2023-10-09 NOTE — Progress Notes (Signed)
 Pharmacy Patient Advocate Encounter  Insurance verification completed.   The patient is insured through [employer] claim for Prolia . Co-pay is $300.  This test claim was processed through [facility]- copay amounts may vary at other pharmacies due to pharmacy/plan contracts, or as the patient moves through the different stages of their insurance plan.

## 2023-10-09 NOTE — Assessment & Plan Note (Signed)
Chronic problem.  On Fenofibrate daily w/o difficulty.  Check labs.  Adjust meds prn  

## 2023-10-09 NOTE — Addendum Note (Signed)
 Addended by: Theora Vankirk K on: 10/09/2023 01:43 PM   Modules accepted: Orders

## 2023-10-09 NOTE — Patient Instructions (Signed)
 Schedule your complete physical in 6 months We'll notify you of your lab results and make any changes if needed Continue to work on healthy diet and regular exercise- you look great! Call with any questions or concerns Stay Safe!  Stay Healthy! Enjoy the rest of your summer!!!

## 2023-10-10 ENCOUNTER — Ambulatory Visit: Payer: Self-pay | Admitting: Family Medicine

## 2023-10-10 ENCOUNTER — Other Ambulatory Visit: Payer: Self-pay

## 2023-10-10 ENCOUNTER — Telehealth: Payer: Self-pay

## 2023-10-10 LAB — VITAMIN D 25 HYDROXY (VIT D DEFICIENCY, FRACTURES): VITD: 55.8 ng/mL (ref 30.00–100.00)

## 2023-10-10 NOTE — Telephone Encounter (Signed)
 Called LM to answer some clarifying questions about the Prolia  injection

## 2023-10-10 NOTE — Telephone Encounter (Signed)
 Copied from CRM [id number]. Topic: Clinical - Prescription Issue >> Oct 10, 2023 11:27 AM Franky GRADE wrote: Annabella is calling to follow up, she spoke with patient and patient advised she received the prolia  injection already. Tiffany needs to speak with Dr.Tabori or her medical assistant to see what exactly happened. Best call back number [phone].

## 2023-10-10 NOTE — Progress Notes (Signed)
 Spoke with patient-she states she received Prolia  injection on 8/11 at office. Office may have done a buy/bill. Called office to confirm-waiting on call back. Will adjust initial assessment date.

## 2023-10-10 NOTE — Progress Notes (Signed)
 Pt has reviewed labs via MyChart

## 2023-10-17 ENCOUNTER — Ambulatory Visit (INDEPENDENT_AMBULATORY_CARE_PROVIDER_SITE_OTHER): Admitting: *Deleted

## 2023-10-17 VITALS — Ht 64.0 in | Wt 157.0 lb

## 2023-10-17 DIAGNOSIS — Z0000 Encounter for general adult medical examination without abnormal findings: Secondary | ICD-10-CM | POA: Diagnosis not present

## 2023-10-17 NOTE — Patient Instructions (Signed)
 Tracy [name] , Thank you for taking time to come for your Medicare Wellness Visit. I appreciate your ongoing commitment to your health goals. Please review the following plan we discussed and let me know if I can assist you in the future.   Screening recommendations/referrals: Colonoscopy: no longer required Mammogram:  Bone Density:  Recommended yearly ophthalmology/optometry visit for glaucoma screening and checkup Recommended yearly dental visit for hygiene and checkup  Vaccinations: Influenza vaccine:  Pneumococcal vaccine: Tdap vaccine:  Shingles vaccine:            Preventive Care 65 Years and Older, Female Preventive care refers to lifestyle choices and visits with your health care provider that can promote health and wellness. What does preventive care include? A yearly physical exam. This is also called an annual well check. Dental exams once or twice a year. Routine eye exams. Ask your health care provider how often you should have your eyes checked. Personal lifestyle choices, including: Daily care of your teeth and gums. Regular physical activity. Eating a healthy diet. Avoiding tobacco and drug use. Limiting alcohol  use. Practicing safe sex. Taking low-dose aspirin  every day. Taking vitamin and mineral supplements as recommended by your health care provider. What happens during an annual well check? The services and screenings done by your health care provider during your annual well check will depend on your age, overall health, lifestyle risk factors, and family history of disease. Counseling  Your health care provider may ask you questions about your: Alcohol  use. Tobacco use. Drug use. Emotional well-being. Home and relationship well-being. Sexual activity. Eating habits. History of falls. Memory and ability to understand (cognition). Work and work Astronomer. Reproductive health. Screening  You may have the following tests or  measurements: Height, weight, and BMI. Blood pressure. Lipid and cholesterol levels. These may be checked every 5 years, or more frequently if you are over 72 years old. Skin check. Lung cancer screening. You may have this screening every year starting at age 32 if you have a 30-pack-year history of smoking and currently smoke or have quit within the past 15 years. Fecal occult blood test (FOBT) of the stool. You may have this test every year starting at age 49. Flexible sigmoidoscopy or colonoscopy. You may have a sigmoidoscopy every 5 years or a colonoscopy every 10 years starting at age 76. Hepatitis C blood test. Hepatitis B blood test. Sexually transmitted disease (STD) testing. Diabetes screening. This is done by checking your blood sugar (glucose) after you have not eaten for a while (fasting). You may have this done every 1-3 years. Bone density scan. This is done to screen for osteoporosis. You may have this done starting at age 86. Mammogram. This may be done every 1-2 years. Talk to your health care provider about how often you should have regular mammograms. Talk with your health care provider about your test results, treatment options, and if necessary, the need for more tests. Vaccines  Your health care provider may recommend certain vaccines, such as: Influenza vaccine. This is recommended every year. Tetanus, diphtheria, and acellular pertussis (Tdap, Td) vaccine. You may need a Td booster every 10 years. Zoster vaccine. You may need this after age 29. Pneumococcal 13-valent conjugate (PCV13) vaccine. One dose is recommended after age 95. Pneumococcal polysaccharide (PPSV23) vaccine. One dose is recommended after age 12. Talk to your health care provider about which screenings and vaccines you need and how often you need them. This information is not intended to replace advice given  to you by your health care provider. Make sure you discuss any questions you have with your  health care provider. Document Released: 03/13/2015 Document Revised: 11/04/2015 Document Reviewed: 12/16/2014 Elsevier Interactive Patient Education  2017 [employer].  Fall Prevention in the Home Falls can cause injuries. They can happen to people of all ages. There are many things you can do to make your home safe and to help prevent falls. What can I do on the outside of my home? Regularly fix the edges of walkways and driveways and fix any cracks. Remove anything that might make you trip as you walk through a door, such as a raised step or threshold. Trim any bushes or trees on the path to your home. Use bright outdoor lighting. Clear any walking paths of anything that might make someone trip, such as rocks or tools. Regularly check to see if handrails are loose or broken. Make sure that both sides of any steps have handrails. Any raised decks and porches should have guardrails on the edges. Have any leaves, snow, or ice cleared regularly. Use sand or salt on walking paths during winter. Clean up any spills in your garage right away. This includes oil or grease spills. What can I do in the bathroom? Use night lights. Install grab bars by the toilet and in the tub and shower. Do not use towel bars as grab bars. Use non-skid mats or decals in the tub or shower. If you need to sit down in the shower, use a plastic, non-slip stool. Keep the floor dry. Clean up any water  that spills on the floor as soon as it happens. Remove soap buildup in the tub or shower regularly. Attach bath mats securely with double-sided non-slip rug tape. Do not have throw rugs and other things on the floor that can make you trip. What can I do in the bedroom? Use night lights. Make sure that you have a light by your bed that is easy to reach. Do not use any sheets or blankets that are too big for your bed. They should not hang down onto the floor. Have a firm chair that has side arms. You can use this for  support while you get dressed. Do not have throw rugs and other things on the floor that can make you trip. What can I do in the kitchen? Clean up any spills right away. Avoid walking on wet floors. Keep items that you use a lot in easy-to-reach places. If you need to reach something above you, use a strong step stool that has a grab bar. Keep electrical cords out of the way. Do not use floor polish or wax that makes floors slippery. If you must use wax, use non-skid floor wax. Do not have throw rugs and other things on the floor that can make you trip. What can I do with my stairs? Do not leave any items on the stairs. Make sure that there are handrails on both sides of the stairs and use them. Fix handrails that are broken or loose. Make sure that handrails are as long as the stairways. Check any carpeting to make sure that it is firmly attached to the stairs. Fix any carpet that is loose or worn. Avoid having throw rugs at the top or bottom of the stairs. If you do have throw rugs, attach them to the floor with carpet tape. Make sure that you have a light switch at the top of the stairs and the bottom of the  stairs. If you do not have them, ask someone to add them for you. What else can I do to help prevent falls? Wear shoes that: Do not have high heels. Have rubber bottoms. Are comfortable and fit you well. Are closed at the toe. Do not wear sandals. If you use a stepladder: Make sure that it is fully opened. Do not climb a closed stepladder. Make sure that both sides of the stepladder are locked into place. Ask someone to hold it for you, if possible. Clearly mark and make sure that you can see: Any grab bars or handrails. First and last steps. Where the edge of each step is. Use tools that help you move around (mobility aids) if they are needed. These include: Canes. Walkers. Scooters. Crutches. Turn on the lights when you go into a dark area. Replace any light bulbs as soon  as they burn out. Set up your furniture so you have a clear path. Avoid moving your furniture around. If any of your floors are uneven, fix them. If there are any pets around you, be aware of where they are. Review your medicines with your doctor. Some medicines can make you feel dizzy. This can increase your chance of falling. Ask your doctor what other things that you can do to help prevent falls. This information is not intended to replace advice given to you by your health care provider. Make sure you discuss any questions you have with your health care provider. Document Released: 12/11/2008 Document Revised: 07/23/2015 Document Reviewed: 03/21/2014 Elsevier Interactive Patient Education  2017 [employer].

## 2023-10-17 NOTE — Progress Notes (Signed)
 Subjective:   Tracy [name] is a 73 y.o. female who presents for Medicare Annual (Subsequent) preventive examination.  Visit Complete: Virtual I connected with  Tracy [name] on 10/17/23 by a audio enabled telemedicine application and verified that I am speaking with the correct person using two identifiers.  Patient Location: Home  Provider Location: Home Office  I discussed the limitations of evaluation and management by telemedicine. The patient expressed understanding and agreed to proceed.  Vital Signs: Because this visit was a virtual/telehealth visit, some criteria may be missing or patient reported. Any vitals not documented were not able to be obtained and vitals that have been documented are patient reported.   Cardiac Risk Factors include: advanced age (>22men, >52 women);hypertension;obesity (BMI >30kg/m2)     Objective:    Today's Vitals   10/17/23 1113  Weight: 157 lb (71.2 kg)  Height: 5' 4 (1.626 m)   Body mass index is 26.95 kg/m.     10/17/2023   10:56 AM 08/14/2023    9:44 AM 08/08/2023    8:57 AM 06/27/2023    8:53 AM 01/25/2023    3:34 PM 06/29/2022    2:03 PM 06/24/2021    4:04 PM  Advanced Directives  Does Patient Have a Medical Advance Directive? Yes Yes Yes Yes No Yes Yes  Type of Estate agent of [employer] Power of [place];Living will Healthcare Power of [place];Living will Living will;Healthcare Power of Attorney  Living will;Healthcare Power of [employer] Power of [place];Living will  Does patient want to make changes to medical advance directive?  No - Patient declined No - Patient declined      Copy of Healthcare Power of Attorney in Chart? No - copy requested No - copy requested    Yes - validated most recent copy scanned in chart (See row information) No - copy requested    Current Medications (verified) Outpatient Encounter Medications as of 10/17/2023  Medication Sig   Calcium   Carbonate-Vitamin D  (CALCIUM  600+D PO) Take 1 tablet by mouth 2 (two) times daily.   Cholecalciferol  (VITAMIN D ) 2000 units tablet Take 2,000 Units by mouth daily.   denosumab  (PROLIA ) 60 MG/ML SOSY injection Inject 60 mg into the skin every 6 (six) months.   denosumab  (PROLIA ) 60 MG/ML SOSY injection Inject 60 mg into the skin every 6 (six) months.   DULoxetine  (CYMBALTA ) 20 MG capsule Take 1 capsule (20 mg total) by mouth daily.   fenofibrate  160 MG tablet TAKE 1 TABLET(160 MG) BY MOUTH DAILY   Melatonin 5 MG TABS Take 7.5 mg by mouth at bedtime.   Multiple Vitamin (MULTIVITAMIN WITH MINERALS) TABS tablet Take 1 tablet by mouth daily.   pantoprazole  (PROTONIX ) 40 MG tablet Take 1 tablet (40 mg total) by mouth 2 (two) times daily.   polyethylene glycol (MIRALAX  / GLYCOLAX ) packet Take 17 g by mouth daily.   Propylene Glycol, PF, (SYSTANE COMPLETE PF) 0.6 % SOLN Place 1 drop into both eyes 2 (two) times daily.   rOPINIRole  (REQUIP ) 0.25 MG tablet Take 1 tablet (0.25 mg total) by mouth 3 (three) times daily. (Patient not taking: Reported on 10/17/2023)   Facility-Administered Encounter Medications as of 10/17/2023  Medication   denosumab  (PROLIA ) injection 60 mg    Allergies (verified) Amoxicillin -pot clavulanate, Nylon, Demerol  [meperidine ], Latex, Other, and Tramadol    History: Past Medical History:  Diagnosis Date   Allergy    Anxiety    Arthritis    Bursitis of left hip  C. difficile diarrhea    Cancer (HCC)    basil cell   Cataract    bilateral   Complication of anesthesia    Depression    Diverticulitis    GERD (gastroesophageal reflux disease)    Hx: UTI (urinary tract infection)    Hyperlipidemia    Hyperthyroidism    Osteopenia    Osteoporosis    PONV (postoperative nausea and vomiting)    Rosacea    Rotator cuff arthropathy, left    Past Surgical History:  Procedure Laterality Date   APPENDECTOMY     BACK SURGERY     CATARACT EXTRACTION, BILATERAL  11/2018    CHOLECYSTECTOMY     COLONOSCOPY     FOOT SURGERY Right    X 2   KNEE SURGERY     LAPAROSCOPIC LIVER CYST FENESTRATION     LUMBAR DISC SURGERY Left    OPEN SURGICAL REPAIR OF GLUTEAL TENDON Left 04/01/2014   Procedure: LEFT HIP BURSECTOMY WITH GLUTEAL TENDON REPAIR;  Surgeon: Dempsey Melodi GAILS, MD;  Location: [facility];  Service: Orthopedics;  Laterality: Left;   REVERSE SHOULDER ARTHROPLASTY Right 06/23/2017   Procedure: RIGHT REVERSE SHOULDER ARTHROPLASTY;  Surgeon: Kay Kemps, MD;  Location: [facility] OR;  Service: Orthopedics;  Laterality: Right;   TONSILLECTOMY     TOTAL HIP ARTHROPLASTY Right 08/14/2023   Procedure: ARTHROPLASTY, HIP, TOTAL, ANTERIOR APPROACH;  Surgeon: Melodi Dempsey, MD;  Location: [facility];  Service: Orthopedics;  Laterality: Right;   TOTAL KNEE ARTHROPLASTY Right 08/17/2015   Procedure: RIGHT TOTAL KNEE ARTHROPLASTY;  Surgeon: Dempsey Melodi, MD;  Location: [facility];  Service: Orthopedics;  Laterality: Right;   TUBAL LIGATION     Family History  Problem Relation Age of Onset   Hypertension Mother    Stroke Mother    Liver disease Father        alcohol  related   COPD Father    Bone cancer Paternal Grandfather    Cancer Neg Hx    Diabetes Neg Hx    Colon cancer Neg Hx    Esophageal cancer Neg Hx    Pancreatic cancer Neg Hx    Stomach cancer Neg Hx    Social History   Socioeconomic History   Marital status: Married    Spouse name: Not on file   Number of children: Not on file   Years of education: Not on file   Highest education level: Associate degree: academic program  Occupational History   Not on file  Tobacco Use   Smoking status: Never   Smokeless tobacco: Never  Vaping Use   Vaping status: Never Used  Substance and Sexual Activity   Alcohol  use: No   Drug use: No   Sexual activity: Not Currently  Other Topics Concern   Not on file  Social History Narrative   Not on file   Social Drivers of Health   Financial Resource Strain: Low Risk   (10/17/2023)   Overall Financial Resource Strain (CARDIA)    Difficulty of Paying Living Expenses: Not hard at all  Food Insecurity: No Food Insecurity (10/17/2023)   Hunger Vital Sign    Worried About Running Out of Food in the Last Year: Never true    Ran Out of Food in the Last Year: Never true  Transportation Needs: No Transportation Needs (10/17/2023)   PRAPARE - Administrator, Civil Service (Medical): No    Lack of Transportation (Non-Medical): No  Physical Activity: Insufficiently Active (10/17/2023)  Exercise Vital Sign    Days of Exercise per Week: 4 days    Minutes of Exercise per Session: 20 min  Stress: Stress Concern Present (10/17/2023)   [employer] of Occupational Health - Occupational Stress Questionnaire    Feeling of Stress: To some extent  Social Connections: Moderately Integrated (10/17/2023)   Social Connection and Isolation Panel    Frequency of Communication with Friends and Family: More than three times a week    Frequency of Social Gatherings with Friends and Family: More than three times a week    Attends Religious Services: More than 4 times per year    Active Member of [employer] or Organizations: No    Attends Engineer, structural: Never    Marital Status: Married    Tobacco Counseling Counseling given: Not Answered   Clinical Intake:  Pre-visit preparation completed: Yes  Pain : No/denies pain     Diabetes: No  How often do you need to have someone help you when you read instructions, pamphlets, or other written materials from your doctor or pharmacy?: 1 - Never  Interpreter Needed?: No  Information entered by :: Mliss Graff LPN   Activities of Daily Living    10/17/2023   10:59 AM 08/14/2023    3:26 PM  In your present state of health, do you have any difficulty performing the following activities:  Hearing? 0 0  Vision? 0 1  Difficulty concentrating or making decisions? 0 1  Walking or climbing stairs? 0    Dressing or bathing? 0   Doing errands, shopping? 0   Preparing Food and eating ? N   Using the Toilet? N   In the past six months, have you accidently leaked urine? N   Do you have problems with loss of bowel control? N   Managing your Medications? N   Managing your Finances? N   Housekeeping or managing your Housekeeping? N     Patient Care Team: Mahlon Comer BRAVO, MD as PCP - General (Family Medicine) Court Dorn PARAS, MD as PCP - Cardiology (Cardiology) Trixie File, MD as Consulting Physician (Internal Medicine) Nieves Cough, MD as Consulting Physician (Urology) Melodi Lerner, MD as Consulting Physician (Orthopedic Surgery) Lynnell Nottingham, MD as Consulting Physician (Dermatology) Shila Gustav GAILS, MD as Consulting Physician (Gastroenterology) Lelon Lonni BIRCH, MD as Consulting Physician (Surgery) Kay Kemps, MD as Consulting Physician (Orthopedic Surgery) Kit Rush, MD as Consulting Physician (Orthopedic Surgery) Pandora Cadet, [facility] as Pharmacist (Pharmacist) Melodi Lerner, MD as Consulting Physician (Orthopedic Surgery)  Indicate any recent Medical Services you may have received from other than [facility] providers in the past year (date may be approximate).     Assessment:   This is a routine wellness examination for [facility].  Hearing/Vision screen Hearing Screening - Comments:: No trouble hearing Vision Screening - Comments:: Up to date [facility]   Goals Addressed             This Visit's Progress    Increase physical activity   Not on track    Increase activity by joining gym/water  aerobics.      Increase physical activity       Patient Stated   On track    Continue eating healthy & drinking lots of water      Patient Stated   On track    Continue current lifestyle        Depression Screen    10/17/2023   11:00 AM 10/09/2023    1:04 PM 04/11/2023  11:02 AM 09/19/2022   10:11 AM 06/29/2022    2:07 PM 06/15/2022   12:50 PM 04/06/2022    10:57 AM  PHQ 2/9 Scores  PHQ - 2 Score 0 3 0 0 0 3 2  PHQ- 9 Score 2 6 2  0 2 6 4     Fall Risk    10/17/2023   10:54 AM 07/31/2023   10:44 AM 07/07/2023    9:12 AM 04/11/2023   11:02 AM 09/19/2022   10:11 AM  Fall Risk   Falls in the past year? 0 0 0 0 0  Number falls in past yr: 0 0 0 0 0  Injury with Fall? 0 0 0 0 0  Risk for fall due to :     No Fall Risks  Follow up Falls evaluation completed;Education provided;Falls prevention discussed    Falls evaluation completed    MEDICARE RISK AT HOME: Medicare Risk at Home Any stairs in or around the home?: Yes If so, are there any without handrails?: No Home free of loose throw rugs in walkways, pet beds, electrical cords, etc?: Yes Adequate lighting in your home to reduce risk of falls?: Yes Life alert?: No Use of a cane, walker or w/c?: No Grab bars in the bathroom?: No Shower chair or bench in shower?: No Elevated toilet seat or a handicapped toilet?: No  TIMED UP AND GO:  Was the test performed?  No    Cognitive Function:    11/28/2017   10:35 AM  MMSE - Mini Mental State Exam  Orientation to time 5  Orientation to Place 5  Registration 3  Attention/ Calculation 5  Recall 3  Language- name 2 objects 2  Language- repeat 1  Language- follow 3 step command 3  Language- read & follow direction 1  Write a sentence 1  Copy design 1  Total score 30        10/17/2023   10:57 AM 06/29/2022    2:05 PM 03/09/2020    9:18 AM  6CIT Screen  What Year? 0 points 0 points 0 points  What month? 0 points 0 points 0 points  What time? 0 points 0 points 0 points  Count back from 20 0 points 0 points 0 points  Months in reverse 0 points 0 points 0 points  Repeat phrase 0 points 0 points 0 points  Total Score 0 points 0 points 0 points    Immunizations Immunization History  Administered Date(s) Administered   Influenza Whole 11/28/2012   Influenza, High Dose Seasonal PF 11/10/2014, 10/26/2015, 11/09/2016, 10/31/2018,  11/20/2019, 11/03/2020, 11/02/2022   Influenza,inj,Quad PF,6+ Mos 12/09/2013, 10/30/2017   Influenza-Unspecified 10/31/2018, 11/10/2021, 11/02/2022   PFIZER Comirnaty(Gray Top)Covid-19 Tri-Sucrose Vaccine 06/22/2020   PFIZER(Purple Top)SARS-COV-2 Vaccination 03/22/2019, 04/12/2019, 12/04/2019, 06/22/2020, 11/10/2020   Pfizer Covid Bivalent Pediatric Vaccine(72mos to <44yrs) 06/30/2021   Pfizer Covid-19 Vaccine Bivalent Booster 75yrs & up 11/10/2020, 06/30/2021   Pfizer(Comirnaty)Fall Seasonal Vaccine 12 years and older 12/03/2021, 11/07/2022   Pneumococcal Conjugate-13 09/08/2014   Pneumococcal Polysaccharide-23 07/16/2013   Tdap 06/28/2012   Zoster Recombinant(Shingrix) 05/05/2023    TDAP status: Patient wishes to receive this vaccine today after disclosing that insurance does not cover the vaccine as a preventative vaccine.   Flu Vaccine status: Due, Education has been provided regarding the importance of this vaccine. Advised may receive this vaccine at local pharmacy or Health Dept. Aware to provide a copy of the vaccination record if obtained from local pharmacy or Health Dept. Verbalized acceptance and  understanding.  Pneumococcal vaccine status: Up to date  Covid-19 vaccine status: Information provided on how to obtain vaccines.   Qualifies for Shingles Vaccine? Yes   Zostavax completed No   Shingrix Completed?: No.    Education has been provided regarding the importance of this vaccine. Patient has been advised to call insurance company to determine out of pocket expense if they have not yet received this vaccine. Advised may also receive vaccine at local pharmacy or Health Dept. Verbalized acceptance and understanding.  Screening Tests Health Maintenance  Topic Date Due   DTaP/Tdap/Td (2 - Td or Tdap) 06/29/2022   COVID-19 Vaccine (12 - 2024-25 season) 05/07/2023   Zoster Vaccines- Shingrix (2 of 2) 06/30/2023   INFLUENZA VACCINE  09/29/2023   MAMMOGRAM  12/12/2023   Medicare  Annual Wellness (AWV)  10/16/2024   Colonoscopy  01/06/2027   Pneumococcal Vaccine: 50+ Years  Completed   DEXA SCAN  Completed   Hepatitis C Screening  Completed   HPV VACCINES  Aged Out   Meningococcal B Vaccine  Aged Out   Pneumococcal Vaccine  Discontinued    Health Maintenance  Health Maintenance Due  Topic Date Due   DTaP/Tdap/Td (2 - Td or Tdap) 06/29/2022   COVID-19 Vaccine (12 - 2024-25 season) 05/07/2023   Zoster Vaccines- Shingrix (2 of 2) 06/30/2023   INFLUENZA VACCINE  09/29/2023    Colorectal cancer screening: No longer required.   Mammogram status: Completed  . Repeat every year  Bone Density status: Completed 2025. Results reflect: Bone density results: OSTEOPOROSIS. Repeat every 2 years.  Lung Cancer Screening: (Low Dose CT Chest recommended if Age 9-80 years, 20 pack-year currently smoking OR have quit w/in 15years.) does not qualify.   Lung Cancer Screening Referral:   Additional Screening:  Hepatitis C Screening: does not qualify; Completed 2017  Vision Screening: Recommended annual ophthalmology exams for early detection of glaucoma and other disorders of the eye. Is the patient up to date with their annual eye exam?  Yes  Who is the provider or what is the name of the office in which the patient attends annual eye exams? [facility] If pt is not established with a provider, would they like to be referred to a provider to establish care? No .   Dental Screening: Recommended annual dental exams for proper oral hygiene    Community Resource Referral / Chronic Care Management: CRR required this visit?  No   CCM required this visit?  No     Plan:     I have personally reviewed and noted the following in the patient's chart:   Medical and social history Use of alcohol , tobacco or illicit drugs  Current medications and supplements including opioid prescriptions. Patient is not currently taking opioid prescriptions. Functional ability and  status Nutritional status Physical activity Advanced directives List of other physicians Hospitalizations, surgeries, and ER visits in previous 12 months Vitals Screenings to include cognitive, depression, and falls Referrals and appointments  In addition, I have reviewed and discussed with patient certain preventive protocols, quality metrics, and best practice recommendations. A written personalized care plan for preventive services as well as general preventive health recommendations were provided to patient.     Mliss Graff, LPN   1/80/7974   After Visit Summary: (MyChart) Due to this being a telephonic visit, the after visit summary with patients personalized plan was offered to patient via MyChart   Nurse Notes:

## 2023-10-20 ENCOUNTER — Encounter: Payer: Self-pay | Admitting: Family Medicine

## 2023-10-20 ENCOUNTER — Ambulatory Visit (INDEPENDENT_AMBULATORY_CARE_PROVIDER_SITE_OTHER): Admitting: Family Medicine

## 2023-10-20 ENCOUNTER — Ambulatory Visit: Payer: Self-pay

## 2023-10-20 VITALS — BP 136/74 | HR 68 | Temp 98.2°F | Resp 15 | Ht 64.0 in | Wt 157.0 lb

## 2023-10-20 DIAGNOSIS — N390 Urinary tract infection, site not specified: Secondary | ICD-10-CM | POA: Diagnosis not present

## 2023-10-20 DIAGNOSIS — R350 Frequency of micturition: Secondary | ICD-10-CM

## 2023-10-20 DIAGNOSIS — R319 Hematuria, unspecified: Secondary | ICD-10-CM | POA: Diagnosis not present

## 2023-10-20 LAB — POCT URINALYSIS DIPSTICK
Bilirubin, UA: NEGATIVE
Glucose, UA: NEGATIVE
Ketones, UA: NEGATIVE
Nitrite, UA: NEGATIVE
Protein, UA: POSITIVE — AB
Spec Grav, UA: 1.02 (ref 1.010–1.025)
Urobilinogen, UA: 0.2 U/dL
pH, UA: 5.5 (ref 5.0–8.0)

## 2023-10-20 MED ORDER — SULFAMETHOXAZOLE-TRIMETHOPRIM 800-160 MG PO TABS: 1.0000 | ORAL_TABLET | Freq: Two times a day (BID) | ORAL | 0 refills | Status: AC

## 2023-10-20 NOTE — Patient Instructions (Addendum)
 Thank you for coming in today.  I do think that you have a urinary tract infection, and we will start antibiotics Septra  twice per day for the next 3 days.  Make sure to continue to drink plenty of fluids, see information below on urinary tract infections.  I did order a urine culture and we will let you know if we need to change any treatment once I receive those results.  We did note some blood in the urine which is likely due to the urinary tract infection, but it may be a good idea to repeat a urine test in the next 1 month and make sure that has cleared.  That can be scheduled as a lab visit.   I expect your symptoms to be improving soon, be seen if any new or worsening symptoms.  Take care!  Urinary Tract Infection, Female A urinary tract infection (UTI) is an infection in your urinary tract. The urinary tract is made up of organs that make, store, and get rid of pee (urine) in your body. These organs include: The kidneys. The ureters. The bladder. The urethra. What are the causes? Most UTIs are caused by germs called bacteria. They may be in or near your genitals. These germs grow and cause swelling in your urinary tract. What increases the risk? You're more likely to get a UTI if: You're a female. The urethra is shorter in females than in males. You have a soft tube called a catheter that drains your pee. You can't control when you pee or poop. You have trouble peeing because of: A kidney stone. A urinary blockage. A nerve condition that affects your bladder. Not getting enough to drink. You're sexually active. You use a birth control inside your vagina, like spermicide. You're pregnant. You have low levels of the hormone estrogen in your body. You're an older adult. You're also more likely to get a UTI if you have other health problems. These may include: Diabetes. A weak immune system. Your immune system is your body's defense system. Sickle cell disease. Injury of the  spine. What are the signs or symptoms? Symptoms may include: Needing to pee right away. Peeing small amounts often. Pain or burning when you pee. Blood in your pee. Pee that smells bad or odd. Pain in your belly or lower back. You may also: Feel confused. This may be the first symptom in older adults. Vomit. Not feel hungry. Feel tired or easily annoyed. Have a fever or chills. How is this diagnosed? A UTI is diagnosed based on your medical history and an exam. You may also have other tests. These may include: Pee tests. Blood tests. Tests for sexually transmitted infections (STIs). If you've had more than one UTI, you may need to have imaging studies done to find out why you keep getting them. How is this treated? A UTI can be treated by: Taking antibiotics or other medicines. Drinking enough fluid to keep your pee pale yellow. In rare cases, a UTI can cause a very bad condition called sepsis. Sepsis may be treated in the hospital. Follow these instructions at home: Medicines Take your medicines only as told by your health care provider. If you were given antibiotics, take them as told by your provider. Do not stop taking them even if you start to feel better. General instructions Make sure you: Pee often and fully. Do not hold your pee for a long time. Wipe from front to back after you pee or poop. Use each  tissue only once when you wipe. Pee after you have sex. Do not douche or use sprays or powders in your genital area. Contact a health care provider if: Your symptoms don't get better after 1-2 days of taking antibiotics. Your symptoms go away and then come back. You have a fever or chills. You vomit or feel like you may vomit. Get help right away if: You have very bad pain in your back or lower belly. You faint. This information is not intended to replace advice given to you by your health care provider. Make sure you discuss any questions you have with your health  care provider. Document Revised: 01/25/2023 Document Reviewed: 05/20/2022 Elsevier Patient Education  2025 [employer].

## 2023-10-20 NOTE — Progress Notes (Signed)
 Subjective:  Patient ID: Tracy [name], female    DOB: 1950/04/24  Age: 73 y.o. MRN: [id number]  CC:  Chief Complaint  Patient presents with   Urinary Frequency    Sx started Tuesday. Started out as a yeast infection. Was tx with monostat. Burning. Pressure. Slight odor to urine.    HPI Tracy [name] presents for  Acute symptoms above, PCP is Dr. Mahlon.  Urinary frequency Started 2 days ago - initially thought yeast infection d/t itching. Tried otc monistat 3 day - no relief. Now with increased pressure, urgency, frequency. No abd/back pain/fever/gross hematuria.   Tx: monistat.   Has tolerated septra  in past. No recent UTI.    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   OSA (obstructive sleep apnea) 06/22/2020   Amputated toe (HCC) 05/20/2020   Hammer toe 04/27/2020   Dyspnea on exertion 10/10/2018   Overweight (BMI 25.0-29.9) 11/28/2017   Encounter for other orthopedic aftercare 07/04/2017   S/P shoulder replacement, right 06/23/2017   Shoulder arthritis 03/01/2017   Perforation of sigmoid colon due to diverticulitis 07/05/2016   OA (osteoarthritis) of knee 08/17/2015   Thyroid  nodule, 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   Subclinical hyperthyroidism 06/05/2014   Trochanteric bursitis of left hip 04/01/2014   Rosacea 03/26/2014   Allergic rhinitis 02/19/2014   Left hip pain 12/19/2013   Atrophic vaginitis 10/07/2013   Screening for malignant neoplasm of cervix 01/12/2011   Physical exam 01/12/2011   HYPERTRIGLYCERIDEMIA 12/03/2009   GERD 12/03/2009   UTI'S, RECURRENT 12/03/2009   LEG CRAMPS, NOCTURNAL 12/03/2009    DIVERTICULITIS, HX OF 12/03/2009   Depression with anxiety 11/05/2009   Osteoporosis 11/05/2009   Past Medical History:  Diagnosis Date   Allergy 2000   Latex rash   Anxiety 2015   since retirement   Arthritis 2011   Osteoarthritis   Bursitis of left hip    C. difficile diarrhea    Cancer (HCC) 05/11/2017   basil cell   Cataract 12/05/2018   bilateral   Complication of anesthesia    Depression 2018   Chronic pain   Diverticulitis    GERD (gastroesophageal reflux disease) 10   Hx: UTI (urinary tract infection)    Hyperlipidemia ?   Hyperthyroidism    Neuromuscular disorder (HCC) 2019   weakness in legs, legs & feet numb & tingle at night in bed   Osteopenia    Osteoporosis 2011   PONV (postoperative nausea and vomiting)    Rosacea    Rotator cuff arthropathy, left    Sleep apnea 06/03/2020   Chronic Obstructive Sleep Apnea - Dr. Shellia   Past Surgical History:  Procedure Laterality Date   APPENDECTOMY  1965   BACK SURGERY     CATARACT EXTRACTION, BILATERAL  11/2018   CHOLECYSTECTOMY  2015?   Liver cyst removed also   COLONOSCOPY     EYE SURGERY  12/05/2018   Cataract removal   FOOT SURGERY Right    X 2   FRACTURE SURGERY  1955   Left Arm   JOINT REPLACEMENT  07/2015 & 05/2017   Right Knee, Rt. shoulder reverse replacement   KNEE SURGERY  LAPAROSCOPIC LIVER CYST FENESTRATION     LUMBAR DISC SURGERY Left    OPEN SURGICAL REPAIR OF GLUTEAL TENDON Left 04/01/2014   Procedure: LEFT HIP BURSECTOMY WITH GLUTEAL TENDON REPAIR;  Surgeon: Dempsey Melodi GAILS, MD;  Location: [facility];  Service: Orthopedics;  Laterality: Left;   REVERSE SHOULDER ARTHROPLASTY Right 06/23/2017   Procedure: RIGHT REVERSE SHOULDER ARTHROPLASTY;  Surgeon: Kay Kemps, MD;  Location: [facility];  Service: Orthopedics;  Laterality: Right;   SPINE SURGERY  Dr. Colon - 12/30/2008   Lumbar microdiskectomy   TONSILLECTOMY     TOTAL HIP ARTHROPLASTY Right 08/14/2023   Procedure: ARTHROPLASTY, HIP, TOTAL,  ANTERIOR APPROACH;  Surgeon: Melodi Dempsey, MD;  Location: [facility];  Service: Orthopedics;  Laterality: Right;   TOTAL KNEE ARTHROPLASTY Right 08/17/2015   Procedure: RIGHT TOTAL KNEE ARTHROPLASTY;  Surgeon: Dempsey Melodi, MD;  Location: [facility];  Service: Orthopedics;  Laterality: Right;   TUBAL LIGATION  1980 Dr. Levander   Tubal ligation   Allergies  Allergen Reactions   Amoxicillin -Pot Clavulanate Diarrhea    Severe diarrhea    Nylon Hives   Demerol  [Meperidine ] Nausea And Vomiting   Latex Rash, Other (See Comments) and Hives    Reaction:  Blisters    Other Itching    Nylon sheets, pt prefers cotton sheets    Tramadol  Nausea And Vomiting   Prior to Admission medications   Medication Sig Start Date End Date Taking? Authorizing Provider  Calcium  Carbonate-Vitamin D  (CALCIUM  600+D PO) Take 1 tablet by mouth 2 (two) times daily.   Yes [provider]  Cholecalciferol  (VITAMIN D ) 2000 units tablet Take 2,000 Units by mouth daily.   Yes [provider]  denosumab  (PROLIA ) 60 MG/ML SOSY injection Inject 60 mg into the skin every 6 (six) months.   Yes [provider]  denosumab  (PROLIA ) 60 MG/ML SOSY injection Inject 60 mg into the skin every 6 (six) months. 10/06/23  Yes Tabori, Katherine E, MD  DULoxetine  (CYMBALTA ) 20 MG capsule Take 1 capsule (20 mg total) by mouth daily. 10/09/23  Yes Tabori, Katherine E, MD  fenofibrate  160 MG tablet TAKE 1 TABLET(160 MG) BY MOUTH DAILY 06/20/23  Yes Tabori, Katherine E, MD  Melatonin 5 MG TABS Take 7.5 mg by mouth at bedtime.   Yes [provider]  Multiple Vitamin (MULTIVITAMIN WITH MINERALS) TABS tablet Take 1 tablet by mouth daily.   Yes [provider]  pantoprazole  (PROTONIX ) 40 MG tablet Take 1 tablet (40 mg total) by mouth 2 (two) times daily. 04/11/23  Yes Tabori, Katherine E, MD  polyethylene glycol (MIRALAX  / GLYCOLAX ) packet Take 17 g by mouth daily. 08/29/16  Yes Gladis Elsie BROCKS, PA-C  Propylene Glycol,  PF, (SYSTANE COMPLETE PF) 0.6 % SOLN Place 1 drop into both eyes 2 (two) times daily.   Yes [provider]  rOPINIRole  (REQUIP ) 0.25 MG tablet Take 1 tablet (0.25 mg total) by mouth 3 (three) times daily. 10/03/23  Yes Neysa Reggy BIRCH, MD   Social History   Socioeconomic History   Marital status: Married    Spouse name: Not on file   Number of children: Not on file   Years of education: Not on file   Highest education level: Associate degree: academic program  Occupational History   Not on file  Tobacco Use   Smoking status: Never   Smokeless tobacco: Never  Vaping Use   Vaping status: Never Used  Substance and Sexual Activity   Alcohol  use: No  Drug use: No   Sexual activity: Not Currently  Other Topics Concern   Not on file  Social History Narrative   Not on file   Social Drivers of Health   Financial Resource Strain: Low Risk  (10/17/2023)   Overall Financial Resource Strain (CARDIA)    Difficulty of Paying Living Expenses: Not hard at all  Food Insecurity: No Food Insecurity (10/17/2023)   Hunger Vital Sign    Worried About Running Out of Food in the Last Year: Never true    Ran Out of Food in the Last Year: Never true  Transportation Needs: No Transportation Needs (10/17/2023)   PRAPARE - Administrator, Civil Service (Medical): No    Lack of Transportation (Non-Medical): No  Physical Activity: Insufficiently Active (10/17/2023)   Exercise Vital Sign    Days of Exercise per Week: 4 days    Minutes of Exercise per Session: 20 min  Stress: Stress Concern Present (10/17/2023)   [employer] of Occupational Health - Occupational Stress Questionnaire    Feeling of Stress: To some extent  Social Connections: Moderately Integrated (10/17/2023)   Social Connection and Isolation Panel    Frequency of Communication with Friends and Family: More than three times a week    Frequency of Social Gatherings with Friends and Family: More than three times a  week    Attends Religious Services: More than 4 times per year    Active Member of [employer] or Organizations: No    Attends Banker Meetings: Never    Marital Status: Married  Catering manager Violence: Not At Risk (10/17/2023)   Humiliation, Afraid, Rape, and Kick questionnaire    Fear of Current or Ex-Partner: No    Emotionally Abused: No    Physically Abused: No    Sexually Abused: No    Review of Systems Per HPI.   Objective:   Vitals:   10/20/23 0940  BP: 136/74  Pulse: 68  Resp: 15  Temp: 98.2 F (36.8 C)  TempSrc: Temporal  SpO2: 96%  Weight: 157 lb (71.2 kg)  Height: 5' 4 (1.626 m)     Physical Exam Vitals reviewed.  Constitutional:      Appearance: Normal appearance. She is well-developed.  HENT:     Head: Normocephalic and atraumatic.  Pulmonary:     Effort: Pulmonary effort is normal.  Abdominal:     General: There is no distension.     Palpations: Abdomen is soft.     Tenderness: There is no abdominal tenderness. There is no guarding or rebound.  Skin:    General: Skin is warm.  Neurological:     Mental Status: She is alert and oriented to person, place, and time.  Psychiatric:        Behavior: Behavior normal.     Results for orders placed or performed in visit on 10/20/23  POCT urinalysis dipstick   Collection Time: 10/20/23  9:58 AM  Result Value Ref Range   Color, UA yellow    Clarity, UA clear    Glucose, UA Negative Negative   Bilirubin, UA Negative    Ketones, UA Negative    Spec Grav, UA 1.020 1.010 - 1.025   Blood, UA 1+    pH, UA 5.5 5.0 - 8.0   Protein, UA Positive (A) Negative   Urobilinogen, UA 0.2 0.2 or 1.0 E.U./dL   Nitrite, UA Negative    Leukocytes, UA Large (3+) (A) Negative   Appearance  Odor       Assessment & Plan:  Tracy [name] is a 73 y.o. female . Urinary frequency - Plan: POCT urinalysis dipstick, sulfamethoxazole -trimethoprim  (BACTRIM  DS) 800-160 MG tablet, Urine Culture  Urinary  tract infection with hematuria, site unspecified - Plan: sulfamethoxazole -trimethoprim  (BACTRIM  DS) 800-160 MG tablet, Urine Culture, POCT urinalysis dipstick Suspected hemorrhagic cystitis, no upper tract symptoms.  Reassuring exam.  Start Septra , continue fluids, check culture.  Lab visit in 1 month to repeat urinalysis to document clearing of hematuria.  RTC precautions given, handout given.  All questions answered.  Meds ordered this encounter  Medications   sulfamethoxazole -trimethoprim  (BACTRIM  DS) 800-160 MG tablet    Sig: Take 1 tablet by mouth 2 (two) times daily for 3 days.    Dispense:  6 tablet    Refill:  0   Patient Instructions  Thank you for coming in today.  I do think that you have a urinary tract infection, and we will start antibiotics Septra  twice per day for the next 3 days.  Make sure to continue to drink plenty of fluids, see information below on urinary tract infections.  I did order a urine culture and we will let you know if we need to change any treatment once I receive those results.  We did note some blood in the urine which is likely due to the urinary tract infection, but it may be a good idea to repeat a urine test in the next 1 month and make sure that has cleared.  That can be scheduled as a lab visit.   I expect your symptoms to be improving soon, be seen if any new or worsening symptoms.  Take care!  Urinary Tract Infection, Female A urinary tract infection (UTI) is an infection in your urinary tract. The urinary tract is made up of organs that make, store, and get rid of pee (urine) in your body. These organs include: The kidneys. The ureters. The bladder. The urethra. What are the causes? Most UTIs are caused by germs called bacteria. They may be in or near your genitals. These germs grow and cause swelling in your urinary tract. What increases the risk? You're more likely to get a UTI if: You're a female. The urethra is shorter in females than in  males. You have a soft tube called a catheter that drains your pee. You can't control when you pee or poop. You have trouble peeing because of: A kidney stone. A urinary blockage. A nerve condition that affects your bladder. Not getting enough to drink. You're sexually active. You use a birth control inside your vagina, like spermicide. You're pregnant. You have low levels of the hormone estrogen in your body. You're an older adult. You're also more likely to get a UTI if you have other health problems. These may include: Diabetes. A weak immune system. Your immune system is your body's defense system. Sickle cell disease. Injury of the spine. What are the signs or symptoms? Symptoms may include: Needing to pee right away. Peeing small amounts often. Pain or burning when you pee. Blood in your pee. Pee that smells bad or odd. Pain in your belly or lower back. You may also: Feel confused. This may be the first symptom in older adults. Vomit. Not feel hungry. Feel tired or easily annoyed. Have a fever or chills. How is this diagnosed? A UTI is diagnosed based on your medical history and an exam. You may also have other tests. These may include: Pee  tests. Blood tests. Tests for sexually transmitted infections (STIs). If you've had more than one UTI, you may need to have imaging studies done to find out why you keep getting them. How is this treated? A UTI can be treated by: Taking antibiotics or other medicines. Drinking enough fluid to keep your pee pale yellow. In rare cases, a UTI can cause a very bad condition called sepsis. Sepsis may be treated in the hospital. Follow these instructions at home: Medicines Take your medicines only as told by your health care provider. If you were given antibiotics, take them as told by your provider. Do not stop taking them even if you start to feel better. General instructions Make sure you: Pee often and fully. Do not hold your  pee for a long time. Wipe from front to back after you pee or poop. Use each tissue only once when you wipe. Pee after you have sex. Do not douche or use sprays or powders in your genital area. Contact a health care provider if: Your symptoms don't get better after 1-2 days of taking antibiotics. Your symptoms go away and then come back. You have a fever or chills. You vomit or feel like you may vomit. Get help right away if: You have very bad pain in your back or lower belly. You faint. This information is not intended to replace advice given to you by your health care provider. Make sure you discuss any questions you have with your health care provider. Document Revised: 01/25/2023 Document Reviewed: 05/20/2022 Elsevier Patient Education  2025 [employer].    Signed,   Reyes Pines, MD [facility], [facility] 10/20/23 10:14 AM

## 2023-10-20 NOTE — Telephone Encounter (Signed)
 FYI Only or Action Required?: Action required by provider: request for appointment.  Patient was last seen in primary care on 10/09/2023 by Mahlon Comer BRAVO, MD.  Called Nurse Triage reporting Urinary Tract Infection.  Symptoms began several days ago.  Interventions attempted: Nothing.  Symptoms are: gradually worsening.  Triage Disposition: See Physician Within 24 Hours  Patient/caregiver understands and will follow disposition?: YesCopied from CRM #[id number]. Topic: Clinical - Red Word Triage >> Oct 20, 2023  8:30 AM [facility] ORN wrote: Red Word that prompted transfer to Nurse Triage: Burning Sensation / Frequent Urination. >> Oct 20, 2023  8:31 AM [facility] ORN wrote: Patient states she's experiencing some burning, frequent urination and pain for the last couple days. Possible UTI that started as a yeast infection.  Reason for Disposition  Urinating more frequently than usual (i.e., frequency) OR new-onset of the feeling of an urgent need to urinate (i.e., urgency)  Answer Assessment - Initial Assessment Questions Pt thought she had yeast infection and was treating that with OTC medication but urinary symptoms started 2 days go and are worsening.     1. SYMPTOM: What's the main symptom you're concerned about? (e.g., frequency, incontinence)     Burning  2. ONSET: When did the    start?     2 days ago  3. PAIN: Is there any pain? If Yes, ask: How bad is it? (Scale: 1-10; mild, moderate, severe)     4 4. CAUSE: What do you think is causing the symptoms?     UTI 5. OTHER SYMPTOMS: Do you have any other symptoms? (e.g., blood in urine, fever, flank pain, pain with urination) Frequency, pressure  Protocols used: Urinary Symptoms-A-AH

## 2023-10-21 ENCOUNTER — Encounter: Payer: Self-pay | Admitting: Family Medicine

## 2023-10-23 LAB — URINE CULTURE
MICRO NUMBER:: 16870880
SPECIMEN QUALITY:: ADEQUATE

## 2023-10-23 MED ORDER — FENOFIBRATE 160 MG PO TABS: 160.0000 mg | ORAL_TABLET | Freq: Every day | ORAL | 2 refills | Status: AC

## 2023-10-24 ENCOUNTER — Ambulatory Visit: Payer: Self-pay | Admitting: Family Medicine

## 2023-11-03 ENCOUNTER — Encounter: Payer: Self-pay | Admitting: Family Medicine

## 2023-11-03 ENCOUNTER — Telehealth: Payer: Self-pay | Admitting: Family Medicine

## 2023-11-03 ENCOUNTER — Other Ambulatory Visit: Payer: Self-pay

## 2023-11-03 NOTE — Telephone Encounter (Signed)
 Type of form received: Surgical Clearance Form  Additional comments: Last regular OV 10/09/23   Received by: Fax  Form should be Faxed/mailed to: (address/ fax #) [phone]  Is patient requesting call for pickup: N/A  Form placed:  Labeled & placed in provider bin  Attach charge sheet.  Provider will determine charge.  Individual made aware of 3-5 business day turn around? N/A

## 2023-11-03 NOTE — Telephone Encounter (Signed)
 Forms placed at nurses station in sign folder

## 2023-11-03 NOTE — Telephone Encounter (Signed)
 Error

## 2023-11-10 ENCOUNTER — Other Ambulatory Visit (INDEPENDENT_AMBULATORY_CARE_PROVIDER_SITE_OTHER): Payer: Self-pay

## 2023-11-10 ENCOUNTER — Encounter: Payer: Self-pay | Admitting: Internal Medicine

## 2023-11-10 ENCOUNTER — Ambulatory Visit: Payer: Self-pay

## 2023-11-10 ENCOUNTER — Ambulatory Visit: Admitting: Internal Medicine

## 2023-11-10 VITALS — BP 130/80 | HR 62 | Temp 98.2°F | Ht 64.0 in | Wt 158.0 lb

## 2023-11-10 DIAGNOSIS — B029 Zoster without complications: Secondary | ICD-10-CM

## 2023-11-10 DIAGNOSIS — N3000 Acute cystitis without hematuria: Secondary | ICD-10-CM | POA: Diagnosis not present

## 2023-11-10 DIAGNOSIS — R300 Dysuria: Secondary | ICD-10-CM

## 2023-11-10 LAB — POC URINALSYSI DIPSTICK (AUTOMATED)
Bilirubin, UA: NEGATIVE
Blood, UA: NEGATIVE
Glucose, UA: NEGATIVE
Ketones, UA: NEGATIVE
Nitrite, UA: NEGATIVE
Protein, UA: NEGATIVE
Spec Grav, UA: 1.015 (ref 1.010–1.025)
Urobilinogen, UA: 0.2 U/dL
pH, UA: 6 (ref 5.0–8.0)

## 2023-11-10 MED ORDER — CEPHALEXIN 500 MG PO CAPS: 500.0000 mg | ORAL_CAPSULE | Freq: Two times a day (BID) | ORAL | 0 refills | Status: DC

## 2023-11-10 MED ORDER — VALACYCLOVIR HCL 1 G PO TABS: 1000.0000 mg | ORAL_TABLET | Freq: Three times a day (TID) | ORAL | 0 refills | Status: AC

## 2023-11-10 NOTE — Patient Instructions (Addendum)
      Medications changes include :   valtrex  1 gm three times a day for 1 week.  Keflex  twice a day x 1 week       Return if symptoms worsen or fail to improve.

## 2023-11-10 NOTE — Telephone Encounter (Signed)
 Patient is seeing Dr. Geofm at [facility] today for urinary concerns.

## 2023-11-10 NOTE — Progress Notes (Signed)
 Subjective:    Patient ID: Tracy [name], female    DOB: 03/30/1950, 73 y.o.   MRN: [id number]      HPI Tracy [name] is here for  Chief Complaint  Patient presents with   Urinary Tract Infection    Started yesterday: burning and urine frequency    Discussed the use of AI scribe software for clinical note transcription with the patient, who gave verbal consent to proceed.  History of Present Illness Tracy [name] is a 73 year old female who presents with urinary symptoms and a rash.  She began experiencing urinary symptoms yesterday evening, including a burning sensation and urgency. This morning, she noticed increased frequency of urination. No hematuria, cloudiness, or odor in the urine. No abdominal pain, fever, nausea, back pain, difficulty urinating, or vaginal bleeding. She had a previous urinary tract infection on August 20th, treated with Bactrim , with resolution of symptoms until now.  She describes a rash that started the night before last, located between her buttocks, which is intensely pruritic and has developed into papules. No pain associated with the rash. She applied a diaper rash cream without relief. She has a history of hip replacement six weeks ago.  In the review of symptoms, she denies headaches, lightheadedness, and new fatigue, stating her fatigue is at baseline. She also denies any kidney pain or stomach pain. She is allergic to penicillin, which causes diarrhea.      Medications and allergies reviewed with patient and updated if appropriate.  Current Outpatient Medications on File Prior to Visit  Medication Sig Dispense Refill   Calcium  Carbonate-Vitamin D  (CALCIUM  600+D PO) Take 1 tablet by mouth 2 (two) times daily.     Cholecalciferol  (VITAMIN D ) 2000 units tablet Take 2,000 Units by mouth daily.     denosumab  (PROLIA ) 60 MG/ML SOSY injection Inject 60 mg into the skin every 6 (six) months.     DULoxetine  (CYMBALTA ) 20 MG capsule  Take 1 capsule (20 mg total) by mouth daily. 30 capsule 3   fenofibrate  160 MG tablet Take 1 tablet (160 mg total) by mouth daily. 90 tablet 2   Melatonin 5 MG TABS Take 7.5 mg by mouth at bedtime.     Multiple Vitamin (MULTIVITAMIN WITH MINERALS) TABS tablet Take 1 tablet by mouth daily.     pantoprazole  (PROTONIX ) 40 MG tablet Take 1 tablet (40 mg total) by mouth 2 (two) times daily. 200 tablet 2   polyethylene glycol (MIRALAX  / GLYCOLAX ) packet Take 17 g by mouth daily. 14 each 0   Propylene Glycol, PF, (SYSTANE COMPLETE PF) 0.6 % SOLN Place 1 drop into both eyes 2 (two) times daily.     denosumab  (PROLIA ) 60 MG/ML SOSY injection Inject 60 mg into the skin every 6 (six) months. 1 mL 1   rOPINIRole  (REQUIP ) 0.25 MG tablet Take 1 tablet (0.25 mg total) by mouth 3 (three) times daily. 30 tablet 1   Current Facility-Administered Medications on File Prior to Visit  Medication Dose Route Frequency Provider Last Rate Last Admin   denosumab  (PROLIA ) injection 60 mg  60 mg Subcutaneous Q6 months Tabori, Katherine E, MD   60 mg at 10/09/23 1330    Review of Systems  Constitutional:  Positive for fatigue (chronic). Negative for fever.  Gastrointestinal:  Negative for abdominal pain and nausea.  Genitourinary:  Positive for dysuria, frequency, urgency and vaginal discharge (yellow - small amount). Negative for difficulty urinating, hematuria and vaginal bleeding.  No urine odor or cloudiness  Musculoskeletal:  Negative for back pain.  Skin:  Positive for rash (buttock).  Neurological:  Negative for light-headedness and headaches.       Objective:   Vitals:   11/10/23 1441  BP: 130/80  Pulse: 62  Temp: 98.2 F (36.8 C)  SpO2: 98%   BP Readings from Last 3 Encounters:  11/10/23 130/80  10/20/23 136/74  10/09/23 134/80   Wt Readings from Last 3 Encounters:  11/10/23 158 lb (71.7 kg)  10/20/23 157 lb (71.2 kg)  10/17/23 157 lb (71.2 kg)   Body mass index is 27.12 kg/m.     Physical Exam Constitutional:      General: She is not in acute distress.    Appearance: Normal appearance. She is not ill-appearing.  HENT:     Head: Normocephalic.  Eyes:     Conjunctiva/sclera: Conjunctivae normal.  Abdominal:     General: There is no distension.     Palpations: Abdomen is soft.     Tenderness: There is no abdominal tenderness. There is no right CVA tenderness, left CVA tenderness, guarding or rebound.  Skin:    General: Skin is warm and dry.     Findings: Rash (Right buttock with 3 patches of clusters of papules, possible small blisters with erythema surrounding each cluster.  No other rash noted posterior right leg) present.  Neurological:     Mental Status: She is alert.            Assessment & Plan:    Assessment and Plan Assessment & Plan Acute cystitis without hematuria Symptoms and urine dipstick suggest UTI.  Last UTI 10/18/2023-treated with Bactrim  and symptoms resolved-?  Recurrent versus new UTI - Prescribe Cefalexin, twice a day for one week. - Encourage increased fluid intake. - Urine culture sent   Suspected herpes zoster (shingles) Unilateral clustered rash suggests shingles. . - Prescribe Valacyclovir , 1 gram, three times a day for one week. - Advise use of over-the-counter cortisone cream for itching, which she has at home. - Recommend Tylenol  for pain management if needed. - Advise against receiving the second shingles vaccine until the current episode resolves. - If she develops any pain or has any questions she will call

## 2023-11-10 NOTE — Telephone Encounter (Signed)
 FYI Only or Action Required?: Action required by provider: request for appointment.  Patient was last seen in primary care on 10/20/2023 by Levora Reyes SAUNDERS, MD.  Called Nurse Triage reporting Urinary Frequency.  Symptoms began yesterday.  Interventions attempted: Nothing.  Symptoms are: unchanged.  Triage Disposition: Call PCP Within 24 Hours  Patient/caregiver understands and will follow disposition?: Yes    Copied from CRM #[id number]. Topic: Clinical - Red Word Triage >> Nov 10, 2023 10:48 AM Tracy [name] wrote: Red Word that prompted transfer to Nurse Triage: Burning  Pt stated that she thinks she has a UTI and would like to schedule an appt with provider. Pt stated that she is experiencing urgency, burning and is uncomfortable when urinating. Reason for Disposition  [1] Painful urination AND [2] EITHER frequency or urgency AND [3] has on-call doctor  Answer Assessment - Initial Assessment Questions No available appts with clinic today; scheduled 11/10/23. Advised call back if symptoms worsen.  1. SEVERITY: How bad is the pain?  (e.g., Scale 1-10; mild, moderate, or severe)     Burning with urination, no pain 2. FREQUENCY: How many times have you had painful urination today?      Since last night, x5 3. PATTERN: Is pain present every time you urinate or just sometimes?      Only burning with urination 4. ONSET: When did the painful urination start?      Last night 5. FEVER: Do you have a fever? If Yes, ask: What is your temperature, how was it measured, and when did it start?     denies 6. PAST UTI: Have you had a urine infection before? If Yes, ask: When was the last time? and What happened that time?      09/2023; just treated with antibiotics, feel like UTI returned 7. CAUSE: What do you think is causing the painful urination?  (e.g., UTI, scratch, Herpes sore)     UTI 8. OTHER SYMPTOMS: Do you have any other symptoms? (e.g., blood in urine, flank pain,  genital sores, urgency, vaginal discharge)     Yellow vaginal discharge;denies odor, reports itching Rash on both buttocks, itching; unable to tell if red  Protocols used: Urination Pain - Female-A-AH

## 2023-11-13 ENCOUNTER — Ambulatory Visit: Payer: Self-pay | Admitting: Internal Medicine

## 2023-11-13 LAB — CULTURE, URINE COMPREHENSIVE: RESULT:: NO GROWTH

## 2023-11-13 NOTE — Telephone Encounter (Signed)
 Dr.Tabori has signed Pt has been notified this is faxed  Placed in scan bin

## 2023-11-13 NOTE — Telephone Encounter (Signed)
 Form completed and returned to [place]

## 2023-11-13 NOTE — Telephone Encounter (Signed)
 Forms have been placed in providers folder for review  Copied from CRM [id number]. Topic: General - Other >> Nov 13, 2023  3:27 PM Tracy [name] wrote: Reason for CRM: Patient is inquirering when the surgical clearance forms will be sent in to surgeries office , they are wanting to move her surgery up but the surgical team needs the clearance forms sent in to them

## 2023-11-17 ENCOUNTER — Other Ambulatory Visit: Payer: Self-pay

## 2023-11-20 ENCOUNTER — Other Ambulatory Visit (INDEPENDENT_AMBULATORY_CARE_PROVIDER_SITE_OTHER)

## 2023-11-20 DIAGNOSIS — N390 Urinary tract infection, site not specified: Secondary | ICD-10-CM | POA: Diagnosis not present

## 2023-11-20 DIAGNOSIS — R319 Hematuria, unspecified: Secondary | ICD-10-CM

## 2023-11-20 LAB — POCT URINALYSIS DIP (MANUAL ENTRY)
Blood, UA: NEGATIVE
Glucose, UA: NEGATIVE mg/dL
Leukocytes, UA: NEGATIVE
Nitrite, UA: NEGATIVE
Protein Ur, POC: NEGATIVE mg/dL
Spec Grav, UA: 1.02 (ref 1.010–1.025)
Urobilinogen, UA: NEGATIVE U/dL — AB
pH, UA: 5.5 (ref 5.0–8.0)

## 2023-11-24 ENCOUNTER — Other Ambulatory Visit: Payer: Self-pay

## 2023-11-24 DIAGNOSIS — L570 Actinic keratosis: Secondary | ICD-10-CM | POA: Diagnosis not present

## 2023-11-24 DIAGNOSIS — D224 Melanocytic nevi of scalp and neck: Secondary | ICD-10-CM | POA: Diagnosis not present

## 2023-11-24 DIAGNOSIS — D225 Melanocytic nevi of trunk: Secondary | ICD-10-CM | POA: Diagnosis not present

## 2023-11-24 DIAGNOSIS — L814 Other melanin hyperpigmentation: Secondary | ICD-10-CM | POA: Diagnosis not present

## 2023-11-24 DIAGNOSIS — L853 Xerosis cutis: Secondary | ICD-10-CM | POA: Diagnosis not present

## 2023-11-24 DIAGNOSIS — L821 Other seborrheic keratosis: Secondary | ICD-10-CM | POA: Diagnosis not present

## 2023-11-24 DIAGNOSIS — Z85828 Personal history of other malignant neoplasm of skin: Secondary | ICD-10-CM | POA: Diagnosis not present

## 2023-11-28 ENCOUNTER — Other Ambulatory Visit: Payer: Self-pay

## 2023-12-13 NOTE — Telephone Encounter (Signed)
 This form has been faxed for the 2nd time - I have re-faxed back to [facility] on 12/13/23 @ 9:30am

## 2023-12-14 DIAGNOSIS — Z1231 Encounter for screening mammogram for malignant neoplasm of breast: Secondary | ICD-10-CM | POA: Diagnosis not present

## 2023-12-14 LAB — HM MAMMOGRAPHY

## 2023-12-15 ENCOUNTER — Encounter: Payer: Self-pay | Admitting: Family Medicine

## 2023-12-15 ENCOUNTER — Ambulatory Visit (INDEPENDENT_AMBULATORY_CARE_PROVIDER_SITE_OTHER): Admitting: Family Medicine

## 2023-12-15 VITALS — BP 112/76 | HR 70 | Temp 98.1°F | Ht 64.0 in | Wt 160.0 lb

## 2023-12-15 DIAGNOSIS — R413 Other amnesia: Secondary | ICD-10-CM

## 2023-12-15 NOTE — Progress Notes (Unsigned)
   Subjective:    Patient ID: Tracy [name], female    DOB: 1950-07-16, 73 y.o.   MRN: [id number]  HPI 'i always feel cloudy'- pt reports feeling less sharp, loses her train of thought- particularly if someone interrupts her when she's speaking. Wonders if it's the Duloxetine .  At this time, would like to come off medication and see if sxs improve before pursuing additional work up.   Review of Systems For ROS see HPI     Objective:   Physical Exam Vitals reviewed.  Constitutional:      General: She is not in acute distress.    Appearance: Normal appearance. She is not ill-appearing or toxic-appearing.  HENT:     Head: Normocephalic and atraumatic.  Eyes:     Extraocular Movements: Extraocular movements intact.     Conjunctiva/sclera: Conjunctivae normal.  Cardiovascular:     Rate and Rhythm: Normal rate and regular rhythm.  Pulmonary:     Effort: Pulmonary effort is normal. No respiratory distress.  Skin:    General: Skin is warm and dry.  Neurological:     General: No focal deficit present.     Mental Status: She is alert and oriented to person, place, and time.  Psychiatric:        Mood and Affect: Mood normal.        Behavior: Behavior normal.        Thought Content: Thought content normal.           Assessment & Plan:  Memory changes- new.  Pt feels that the Cymbalta  is contributing to her being forgetful, losing her train of thought, and feeling foggy.  Will wean her off medication and see if sxs improve.  If not, we can pursue a memory work up.  Also talked about the contribution of stress to to memory.  She admits she has high levels right now.  Will follow.

## 2023-12-15 NOTE — Patient Instructions (Signed)
 Follow up as scheduled or as needed DECREASE the Duloxetine  to every other day for 2-3 weeks and then stop entirely Try and be present in the moment to better remember conversations/discussion Call with any questions or concerns Stay Safe! Stay Healthy! Hang in there!!

## 2023-12-26 ENCOUNTER — Other Ambulatory Visit: Payer: Self-pay

## 2023-12-26 NOTE — Progress Notes (Signed)
 Prolia  discontinued on 12/15/23 as patient preference dis-enrolling patient

## 2024-01-01 NOTE — H&P (Signed)
 TOTAL KNEE ADMISSION H&P  Patient is being admitted for left total knee arthroplasty.  Subjective:  Chief Complaint: Left knee pain.  HPI: Tracy [name], 73 y.o. female has a history of pain and functional disability in the left knee due to arthritis and has failed non-surgical conservative treatments for greater than 12 weeks to include NSAID's and/or analgesics, corticosteriod injections, and activity modification. Onset of symptoms was gradual, starting several years ago with gradually worsening course since that time. The patient noted no past surgery on the left knee.  Patient currently rates pain in the left knee at 8 out of 10 with activity. Patient has night pain, worsening of pain with activity and weight bearing, pain with passive range of motion, and crepitus. Patient has evidence of bone-on-bone patellofemoral and lateral compartment arthritis in the left knee. The right knee prosthesis is in good position with no periprosthetic abnormalities by imaging studies. There is no active infection.  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   OSA (obstructive sleep apnea) 06/22/2020   Amputated toe 05/20/2020   Hammer toe 04/27/2020   Dyspnea on exertion 10/10/2018   Overweight (BMI 25.0-29.9) 11/28/2017   Encounter for other orthopedic aftercare 07/04/2017   S/P shoulder replacement, right 06/23/2017   Shoulder arthritis 03/01/2017   Perforation of sigmoid colon due to diverticulitis 07/05/2016   OA (osteoarthritis) of knee 08/17/2015   Thyroid  nodule, 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   Subclinical hyperthyroidism 06/05/2014   Trochanteric bursitis  of left hip 04/01/2014   Rosacea 03/26/2014   Allergic rhinitis 02/19/2014   Left hip pain 12/19/2013   Atrophic vaginitis 10/07/2013   Screening for malignant neoplasm of cervix 01/12/2011   Physical exam 01/12/2011   HYPERTRIGLYCERIDEMIA 12/03/2009   GERD 12/03/2009   UTI'S, RECURRENT 12/03/2009   LEG CRAMPS, NOCTURNAL 12/03/2009   DIVERTICULITIS, HX OF 12/03/2009   Depression with anxiety 11/05/2009   Osteoporosis 11/05/2009    Past Medical History:  Diagnosis Date   Allergy 2000   Latex rash   Anxiety 2015   since retirement   Arthritis 2011   Osteoarthritis   Bursitis of left hip    C. difficile diarrhea    Cancer (HCC) 05/11/2017   basil cell   Cataract 12/05/2018   bilateral   Complication of anesthesia    Depression 2018   Chronic pain   Diverticulitis    GERD (gastroesophageal reflux disease) 10   Hx: UTI (urinary tract infection)    Hyperlipidemia ?   Hyperthyroidism    Neuromuscular disorder (HCC) 2019   weakness in legs, legs & feet numb & tingle at night in bed   Osteopenia    Osteoporosis 2011   PONV (postoperative nausea and vomiting)    Rosacea    Rotator cuff arthropathy, left    Sleep apnea 06/03/2020   Chronic Obstructive Sleep Apnea - Dr. Shellia    Past Surgical History:  Procedure Laterality Date   APPENDECTOMY  1965   BACK SURGERY     CATARACT EXTRACTION, BILATERAL  11/2018   CHOLECYSTECTOMY  2015?   Liver cyst removed also   COLONOSCOPY     EYE SURGERY  12/05/2018   Cataract removal  FOOT SURGERY Right    X 2   FRACTURE SURGERY  1955   Left Arm   JOINT REPLACEMENT  07/2015 & 05/2017   Right Knee, Rt. shoulder reverse replacement   KNEE SURGERY     LAPAROSCOPIC LIVER CYST FENESTRATION     LUMBAR DISC SURGERY Left    OPEN SURGICAL REPAIR OF GLUTEAL TENDON Left 04/01/2014   Procedure: LEFT HIP BURSECTOMY WITH GLUTEAL TENDON REPAIR;  Surgeon: Dempsey Melodi GAILS, MD;  Location: [facility];  Service: Orthopedics;  Laterality: Left;   REVERSE  SHOULDER ARTHROPLASTY Right 06/23/2017   Procedure: RIGHT REVERSE SHOULDER ARTHROPLASTY;  Surgeon: Kay Kemps, MD;  Location: [facility];  Service: Orthopedics;  Laterality: Right;   SPINE SURGERY  Dr. Colon - 12/30/2008   Lumbar microdiskectomy   TONSILLECTOMY     TOTAL HIP ARTHROPLASTY Right 08/14/2023   Procedure: ARTHROPLASTY, HIP, TOTAL, ANTERIOR APPROACH;  Surgeon: Melodi Dempsey, MD;  Location: [facility];  Service: Orthopedics;  Laterality: Right;   TOTAL KNEE ARTHROPLASTY Right 08/17/2015   Procedure: RIGHT TOTAL KNEE ARTHROPLASTY;  Surgeon: Dempsey Melodi, MD;  Location: [facility];  Service: Orthopedics;  Laterality: Right;   TUBAL LIGATION  1980 Dr. Levander   Tubal ligation    Prior to Admission medications   Medication Sig Start Date End Date Taking? Authorizing Provider  Calcium  Carbonate-Vitamin D  (CALCIUM  600+D PO) Take 1 tablet by mouth 2 (two) times daily.    [provider]  Cholecalciferol  (VITAMIN D ) 2000 units tablet Take 2,000 Units by mouth daily.    [provider]  denosumab  (PROLIA ) 60 MG/ML SOSY injection Inject 60 mg into the skin every 6 (six) months.    [provider]  DULoxetine  (CYMBALTA ) 20 MG capsule Take 1 capsule (20 mg total) by mouth daily. 10/09/23   Tabori, Katherine E, MD  fenofibrate  160 MG tablet Take 1 tablet (160 mg total) by mouth daily. 10/23/23   Tabori, Katherine E, MD  Melatonin 5 MG TABS Take 7.5 mg by mouth at bedtime.    [provider]  Multiple Vitamin (MULTIVITAMIN WITH MINERALS) TABS tablet Take 1 tablet by mouth daily.    [provider]  pantoprazole  (PROTONIX ) 40 MG tablet Take 1 tablet (40 mg total) by mouth 2 (two) times daily. 04/11/23   Tabori, Katherine E, MD  polyethylene glycol (MIRALAX  / GLYCOLAX ) packet Take 17 g by mouth daily. 08/29/16   Gladis Elsie BROCKS, PA-C  Propylene Glycol, PF, (SYSTANE COMPLETE PF) 0.6 % SOLN Place 1 drop into both eyes 2 (two) times daily.    [provider]     Allergies  Allergen Reactions   Amoxicillin -Pot Clavulanate Diarrhea    Severe diarrhea    Nylon Hives   Demerol  [Meperidine ] Nausea And Vomiting   Latex Rash, Other (See Comments) and Hives    Reaction:  Blisters    Other Itching    Nylon sheets, pt prefers cotton sheets    Tramadol  Nausea And Vomiting    Social History   Socioeconomic History   Marital status: Married    Spouse name: Not on file   Number of children: Not on file   Years of education: Not on file   Highest education level: Associate degree: academic program  Occupational History   Not on file  Tobacco Use   Smoking status: Never   Smokeless tobacco: Never  Vaping Use   Vaping status: Never Used  Substance and Sexual Activity   Alcohol  use: No   Drug  use: No   Sexual activity: Not Currently  Other Topics Concern   Not on file  Social History Narrative   Not on file   Social Drivers of Health   Financial Resource Strain: Low Risk  (10/17/2023)   Overall Financial Resource Strain (CARDIA)    Difficulty of Paying Living Expenses: Not hard at all  Food Insecurity: No Food Insecurity (10/17/2023)   Hunger Vital Sign    Worried About Running Out of Food in the Last Year: Never true    Ran Out of Food in the Last Year: Never true  Transportation Needs: No Transportation Needs (10/17/2023)   PRAPARE - Administrator, Civil Service (Medical): No    Lack of Transportation (Non-Medical): No  Physical Activity: Insufficiently Active (10/17/2023)   Exercise Vital Sign    Days of Exercise per Week: 4 days    Minutes of Exercise per Session: 20 min  Stress: Stress Concern Present (10/17/2023)   [employer] of Occupational Health - Occupational Stress Questionnaire    Feeling of Stress: To some extent  Social Connections: Moderately Integrated (10/17/2023)   Social Connection and Isolation Panel    Frequency of Communication with Friends and Family: More than three times a week     Frequency of Social Gatherings with Friends and Family: More than three times a week    Attends Religious Services: More than 4 times per year    Active Member of [employer] or Organizations: No    Attends Banker Meetings: Never    Marital Status: Married  Catering Manager Violence: Not At Risk (10/17/2023)   Humiliation, Afraid, Rape, and Kick questionnaire    Fear of Current or Ex-Partner: No    Emotionally Abused: No    Physically Abused: No    Sexually Abused: No    Tobacco Use: Low Risk  (12/15/2023)   Patient History    Smoking Tobacco Use: Never    Smokeless Tobacco Use: Never    Passive Exposure: Not on file   Social History   Substance and Sexual Activity  Alcohol  Use No    Family History  Problem Relation Age of Onset   Hypertension Mother    Stroke Mother    Arthritis Mother    Miscarriages / Stillbirths Mother    Liver disease Father        alcohol  related   COPD Father    Alcohol  abuse Father    Depression Father    Bone cancer Paternal Grandfather    Cancer Paternal Grandfather    ADD / ADHD Son    ADD / ADHD Son    Alcohol  abuse Sister    Depression Sister    Diabetes Sister    Obesity Sister    Alcohol  abuse Sister    Heart disease Sister    Stroke Sister    Drug abuse Sister    Obesity Sister    Alcohol  abuse Sister    Depression Sister    Diabetes Sister    Obesity Sister    Drug abuse Sister    Obesity Sister    ADD / ADHD Son    ADD / ADHD Son    Alcohol  abuse Sister    Heart disease Sister    Stroke Sister    Colon cancer Neg Hx    Esophageal cancer Neg Hx    Pancreatic cancer Neg Hx    Stomach cancer Neg Hx     Review of Systems  Constitutional:  Negative for chills and fever.  HENT:  Negative for congestion, sore throat and tinnitus.   Eyes:  Negative for double vision, photophobia and pain.  Respiratory:  Negative for cough, shortness of breath and wheezing.   Cardiovascular:  Negative for chest pain, palpitations  and orthopnea.  Gastrointestinal:  Negative for heartburn, nausea and vomiting.  Genitourinary:  Negative for dysuria, frequency and urgency.  Musculoskeletal:  Positive for joint pain.  Neurological:  Negative for dizziness, weakness and headaches.    Objective:  Physical Exam:  Well-developed female, alert, oriented, no apparent distress.    - Left knee shows effusion. Range of motion: 5-125.    - There is crepitus on range of motion.    - Tenderness medial and lateral.    - No instability.  Imaging Review Plain radiographs demonstrate severe degenerative joint disease of the left knee. The overall alignment is neutral. The bone quality appears to be adequate for age and reported activity level.  Assessment/Plan:  End stage arthritis, left knee   The patient history, physical examination, clinical judgment of the provider and imaging studies are consistent with end stage degenerative joint disease of the left knee and total knee arthroplasty is deemed medically necessary. The treatment options including medical management, injection therapy arthroscopy and arthroplasty were discussed at length. The risks and benefits of total knee arthroplasty were presented and reviewed. The risks due to aseptic loosening, infection, stiffness, patella tracking problems, thromboembolic complications and other imponderables were discussed. The patient acknowledged the explanation, agreed to proceed with the plan and consent was signed. Patient is being admitted for inpatient treatment for surgery, pain control, PT, OT, prophylactic antibiotics, VTE prophylaxis, progressive ambulation and ADLs and discharge planning. The patient is planning to be discharged home.   Patient's anticipated LOS is less than 2 midnights, meeting these requirements: - Lives within 1 hour of care - Has a competent adult at home to recover with post-op recover - NO history of  - Chronic pain requiring opiods  -  Diabetes  - Coronary Artery Disease  - Heart failure  - Heart attack  - Stroke  - DVT/VTE  - Cardiac arrhythmia  - Respiratory Failure/COPD  - Renal failure  - Anemia  - Advanced Liver disease  Therapy Plans: EO [place]  Disposition: Home with Husband  Planned DVT Prophylaxis: 81mg  Aspirin  BID  DME Needed: none, has walker PCP: Comer Greet, MD (clearance received)  TXA: IV  Allergies: Amoxicillin  (GI upset), Latex (rash), Tramadol  (N/V) Metal Allergies: none  Anesthesia Concerns: did well with last surgery but in the past has trouble with N/V BMI: 24.6  Last HgbA1c: not diabetic  Pharmacy: [facility] to bring on DC  Pain regimen: Oxycodone , Tylenol   Other: - Tramadol  allergy**  - Patient was instructed on what medications to stop prior to surgery. - Follow-up visit in 2 weeks with Dr. Melodi - Begin physical therapy following surgery - Pre-operative lab work as pre-surgical testing - Prescriptions will be provided in hospital at time of discharge  Roxie Mess, PA-C Orthopedic Surgery [facility] [place]

## 2024-01-02 NOTE — Progress Notes (Signed)
 COVID Vaccine received:  []  No [x]  Yes Date of any COVID positive Test in last 74 days:none  PCP - Comer Greet, MD  clearance  Cardiologist - Dorn Lesches, MD   Chest x-ray - 08-03-23  Epic EKG - 06-13-23 Epic  Stress Test -  ECHO - 10-17-2018 Epic Cardiac Cath -  CT Coronary Calcium  score: 0 on 10-24-2018 Epic  Pacemaker / ICD device [x]  No []  Yes   Spinal Cord Stimulator:[x]  No []  Yes       History of Sleep Apnea? []  No [x]  Yes   CPAP used?- []  No [x]  Yes    Patient has: [x]  NO Hx DM   []  Pre-DM   []  DM1  []   DM2 Does the patient monitor blood sugar?   [x]  N/A   []  No []  Yes   Blood Thinner / Instructions:  none Aspirin  Instructions:   none  Activity level: Able to walk up 2 flights of stairs without becoming significantly short of breath or having chest pain?  []  No   [x]    Yes  but would have severe leg pain Patient can perform ADLs without assistance. []  No   [x]   Yes  Anesthesia review: OSA-CPAP, PONV, GERD, Depression. HTN  Patient denies any S&S of respiratory illness or Covid - no shortness of breath, fever, cough or chest pain at PAT appointment.  Patient verbalized understanding and agreement to the Pre-Surgical Instructions that were given to them at this PAT appointment. Patient was also educated of the need to review these PAT instructions again prior to her surgery.I reviewed the appropriate phone numbers to call if they have any and questions or concerns.

## 2024-01-02 NOTE — Patient Instructions (Signed)
 SURGICAL WAITING ROOM VISITATION Patients having surgery or a procedure may have no more than 2 support people in the waiting area - these visitors may rotate in the visitor waiting room.   If the patient needs to stay at the hospital during part of their recovery, the visitor guidelines for inpatient rooms apply.  PRE-OP VISITATION  Pre-op nurse will coordinate an appropriate time for 1 support person to accompany the patient in pre-op.  This support person may not rotate.  This visitor will be contacted when the time is appropriate for the visitor to come back in the pre-op area.  Please refer to the [facility] website for the visitor guidelines for Inpatients (after your surgery is over and you are in a regular room).  You are not required to quarantine at this time prior to your surgery. However, you must do this: Hand Hygiene often Do NOT share personal items Notify your provider if you are in close contact with someone who has COVID or you develop fever 100.4 or greater, new onset of sneezing, cough, sore throat, shortness of breath or body aches.  If you test positive for Covid or have been in contact with anyone that has tested positive in the last 10 days please notify you surgeon.    Your procedure is scheduled on:  MONDAY   January 08, 2024  Report to [facility] Main Entrance: [place] entrance where the [employer] is available.   Report to admitting at: 12:30  PM  Call this number if you have any questions or problems the morning of surgery [phone]  Do not eat food after Midnight the night prior to your surgery/procedure.  After Midnight you may have the following liquids until  12:00  Noon    DAY OF SURGERY  Clear Liquid Diet Water  Black Coffee (sugar ok, NO MILK/CREAM OR CREAMERS)  Tea (sugar ok, NO MILK/CREAM OR CREAMERS) regular and decaf                             Plain Jell-O  with no fruit (NO RED)                                            Fruit ices (not with fruit pulp, NO RED)                                     Popsicles (NO RED)                                                                  Juice: NO CITRUS JUICES: only apple, WHITE grape, WHITE cranberry Sports drinks like Gatorade or Powerade (NO RED)                   The day of surgery:  Drink ONE (1) Pre-Surgery G2 at  12:00 noon the Day of surgery. Drink in one sitting. Do not sip.  This drink was given to you during your hospital pre-op appointment visit. Nothing else to drink  after completing the Pre-Surgery or G2 : No candy, chewing gum or throat lozenges.    FOLLOW ANY ADDITIONAL PRE OP INSTRUCTIONS YOU RECEIVED FROM YOUR SURGEON'S OFFICE!!!   Oral Hygiene is also important to reduce your risk of infection.        Remember - BRUSH YOUR TEETH THE MORNING OF SURGERY WITH YOUR REGULAR TOOTHPASTE  Do NOT smoke after Midnight the night before surgery.  STOP TAKING all Vitamins, Herbs and supplements 1 week before your surgery.   Take ONLY these medicines the morning of surgery with A SIP OF WATER :  Pantoprazole . You may use your Eye drops if needed.     If You have been diagnosed with Sleep Apnea - Bring CPAP mask and tubing day of surgery. We will provide you with a CPAP machine on the day of your surgery.                   You may not have any metal on your body including hair pins, jewelry, and body piercing  Do not wear make-up, lotions, powders, perfumes or deodorant  Do not wear nail polish including gel and S&S, artificial / acrylic nails, or any other type of covering on natural nails including finger and toenails. If you have artificial nails, gel coating, etc., that needs to be removed by a nail salon, Please have this removed prior to surgery. Not doing so may mean that your surgery could be cancelled or delayed if the Surgeon or anesthesia staff feels like they are unable to monitor you safely.   Do not shave 48 hours prior to surgery to  avoid nicks in your skin which may contribute to postoperative infections.   Contacts, Hearing Aids, dentures or bridgework may not be worn into surgery. DENTURES WILL BE REMOVED PRIOR TO SURGERY PLEASE DO NOT APPLY Poly grip OR ADHESIVES!!!  You may bring a small overnight bag with you on the day of surgery, only pack items that are not valuable. [facility] IS NOT RESPONSIBLE   FOR VALUABLES THAT ARE LOST OR STOLEN.   Do not bring your home medications to the hospital. The Pharmacy will dispense medications listed on your medication list to you during your admission in the Hospital.  Please read over the following fact sheets you were given: IF YOU HAVE QUESTIONS ABOUT YOUR PRE-OP INSTRUCTIONS, PLEASE CALL [phone].      Pre-operative 4 CHG Bath Instructions   You can play a key role in reducing the risk of infection after surgery. Your skin needs to be as free of germs as possible. You can reduce the number of germs on your skin by washing with CHG (chlorhexidine  gluconate) soap before surgery. CHG is an antiseptic soap that kills germs and continues to kill germs even after washing.   DO NOT use if you have an allergy to chlorhexidine /CHG or antibacterial soaps. If your skin becomes reddened or irritated, stop using the CHG and notify one of our RNs at   Please shower with the CHG soap starting 4 days before surgery using the following schedule:  THURSDAY 01-04-24    Please keep in mind the following:  DO NOT shave, including legs and underarms, starting the day of your first shower.   You may shave your face at any point before/day of surgery.  Place clean sheets on your bed the day you start using CHG soap. Use a clean washcloth (not used since being washed) for each shower. DO NOT sleep with pets  once you start using the CHG.  CHG Shower Instructions:  If you choose to wash your hair and private area, wash first with your normal shampoo/soap.  After you use shampoo/soap,  rinse your hair and body thoroughly to remove shampoo/soap residue.  Turn the water  OFF and apply about 3 tablespoons (45 ml) of CHG soap to a CLEAN washcloth.  Apply CHG soap ONLY FROM YOUR NECK DOWN TO YOUR TOES (washing for 3-5 minutes)  DO NOT use CHG soap on face, private areas, open wounds, or sores.  Pay special attention to the area where your surgery is being performed.  If you are having back surgery, having someone wash your back for you may be helpful. Wait 2 minutes after CHG soap is applied, then you may rinse off the CHG soap.  Pat dry with a clean towel  Put on clean clothes/pajamas   If you choose to wear lotion, please use ONLY the CHG-compatible lotions on the back of this paper.     Additional instructions for the day of surgery: DO NOT APPLY any CHG Soap,  lotions, deodorants, cologne, or perfumes on the day of surgery  Put on clean/comfortable clothes.  Brush your teeth.  Ask your nurse before applying any prescription medications to the skin.   CHG Compatible Lotions   Aveeno Moisturizing lotion  Cetaphil Moisturizing Cream  Cetaphil Moisturizing Lotion  Clairol Herbal Essence Moisturizing Lotion, Dry Skin  Clairol Herbal Essence Moisturizing Lotion, Extra Dry Skin  Clairol Herbal Essence Moisturizing Lotion, Normal Skin  Curel Age Defying Therapeutic Moisturizing Lotion with Alpha Hydroxy  Curel Extreme Care Body Lotion  Curel Soothing Hands Moisturizing Hand Lotion  Curel Therapeutic Moisturizing Cream, Fragrance-Free  Curel Therapeutic Moisturizing Lotion, Fragrance-Free  Curel Therapeutic Moisturizing Lotion, Original Formula  Eucerin Daily Replenishing Lotion  Eucerin Dry Skin Therapy Plus Alpha Hydroxy Crme  Eucerin Dry Skin Therapy Plus Alpha Hydroxy Lotion  Eucerin Original Crme  Eucerin Original Lotion  Eucerin Plus Crme Eucerin Plus Lotion  Eucerin TriLipid Replenishing Lotion  Keri Anti-Bacterial Hand Lotion  Keri Deep Conditioning Original  Lotion Dry Skin Formula Softly Scented  Keri Deep Conditioning Original Lotion, Fragrance Free Sensitive Skin Formula  Keri Lotion Fast Absorbing Fragrance Free Sensitive Skin Formula  Keri Lotion Fast Absorbing Softly Scented Dry Skin Formula  Keri Original Lotion  Keri Skin Renewal Lotion Keri Silky Smooth Lotion  Keri Silky Smooth Sensitive Skin Lotion  Nivea Body Creamy Conditioning Oil  Nivea Body Extra Enriched Lotion  Nivea Body Original Lotion  Nivea Body Sheer Moisturizing Lotion Nivea Crme  Nivea Skin Firming Lotion  NutraDerm 30 Skin Lotion  NutraDerm Skin Lotion  NutraDerm Therapeutic Skin Cream  NutraDerm Therapeutic Skin Lotion  ProShield Protective Hand Cream  Provon moisturizing lotion   FAILURE TO FOLLOW THESE INSTRUCTIONS MAY RESULT IN THE CANCELLATION OF YOUR SURGERY  PATIENT SIGNATURE_________________________________  NURSE SIGNATURE__________________________________  ________________________________________________________________________         Tracy [name]    An incentive spirometer is a tool that can help keep your lungs clear and active. This tool measures how well you are filling your lungs with each breath. Taking long deep breaths may help reverse or decrease the chance of developing breathing (pulmonary) problems (especially infection) following: A long period of time when you are unable to move or be active. BEFORE THE PROCEDURE  If the spirometer includes an indicator to show your best effort, your nurse or respiratory therapist will set it to a desired goal. If possible, sit  up straight or lean slightly forward. Try not to slouch. Hold the incentive spirometer in an upright position. INSTRUCTIONS FOR USE  Sit on the edge of your bed if possible, or sit up as far as you can in bed or on a chair. Hold the incentive spirometer in an upright position. Breathe out normally. Place the mouthpiece in your mouth and seal your lips  tightly around it. Breathe in slowly and as deeply as possible, raising the piston or the ball toward the top of the column. Hold your breath for 3-5 seconds or for as long as possible. Allow the piston or ball to fall to the bottom of the column. Remove the mouthpiece from your mouth and breathe out normally. Rest for a few seconds and repeat Steps 1 through 7 at least 10 times every 1-2 hours when you are awake. Take your time and take a few normal breaths between deep breaths. The spirometer may include an indicator to show your best effort. Use the indicator as a goal to work toward during each repetition. After each set of 10 deep breaths, practice coughing to be sure your lungs are clear. If you have an incision (the cut made at the time of surgery), support your incision when coughing by placing a pillow or rolled up towels firmly against it. Once you are able to get out of bed, walk around indoors and cough well. You may stop using the incentive spirometer when instructed by your caregiver.  RISKS AND COMPLICATIONS Take your time so you do not get dizzy or light-headed. If you are in pain, you may need to take or ask for pain medication before doing incentive spirometry. It is harder to take a deep breath if you are having pain. AFTER USE Rest and breathe slowly and easily. It can be helpful to keep track of a log of your progress. Your caregiver can provide you with a simple table to help with this. If you are using the spirometer at home, follow these instructions: SEEK MEDICAL CARE IF:  You are having difficultly using the spirometer. You have trouble using the spirometer as often as instructed. Your pain medication is not giving enough relief while using the spirometer. You develop fever of 100.5 F (38.1 C) or higher.                                                                                                    SEEK IMMEDIATE MEDICAL CARE IF:  You cough up bloody sputum that  had not been present before. You develop fever of 102 F (38.9 C) or greater. You develop worsening pain at or near the incision site. MAKE SURE YOU:  Understand these instructions. Will watch your condition. Will get help right away if you are not doing well or get worse. Document Released: 06/27/2006 Document Revised: 05/09/2011 Document Reviewed: 08/28/2006 [facility] Patient Information 2014 [place], [place].       If you would like to see a video about joint replacement:   [email or web address]

## 2024-01-03 ENCOUNTER — Encounter (HOSPITAL_COMMUNITY)
Admission: RE | Admit: 2024-01-03 | Discharge: 2024-01-03 | Disposition: A | Discharge status: Home / Self Care | Source: Ambulatory Visit | Attending: Orthopedic Surgery | Admitting: Orthopedic Surgery

## 2024-01-03 ENCOUNTER — Other Ambulatory Visit: Payer: Self-pay

## 2024-01-03 ENCOUNTER — Encounter (HOSPITAL_COMMUNITY): Payer: Self-pay

## 2024-01-03 VITALS — BP 148/81 | HR 64 | Temp 98.4°F | Resp 16 | Ht 64.0 in | Wt 160.1 lb

## 2024-01-03 DIAGNOSIS — M1712 Unilateral primary osteoarthritis, left knee: Secondary | ICD-10-CM | POA: Diagnosis not present

## 2024-01-03 DIAGNOSIS — I10 Essential (primary) hypertension: Secondary | ICD-10-CM | POA: Diagnosis not present

## 2024-01-03 DIAGNOSIS — Z01818 Encounter for other preprocedural examination: Secondary | ICD-10-CM

## 2024-01-03 DIAGNOSIS — Z01812 Encounter for preprocedural laboratory examination: Secondary | ICD-10-CM | POA: Insufficient documentation

## 2024-01-03 HISTORY — DX: Essential (primary) hypertension: I10

## 2024-01-03 LAB — BASIC METABOLIC PANEL WITH GFR
Anion gap: 8 (ref 5–15)
BUN: 19 mg/dL (ref 8–23)
CO2: 26 mmol/L (ref 22–32)
Calcium: 10 mg/dL (ref 8.9–10.3)
Chloride: 110 mmol/L (ref 98–111)
Creatinine, Ser: 0.98 mg/dL (ref 0.44–1.00)
GFR, Estimated: >60 mL/min (ref >60)
Glucose, Bld: 103 mg/dL — ABNORMAL HIGH (ref 70–99)
Potassium: 5.3 mmol/L — ABNORMAL HIGH (ref 3.5–5.1)
Sodium: 144 mmol/L (ref 135–145)

## 2024-01-03 LAB — CBC
HCT: 40.8 % (ref 36.0–46.0)
Hemoglobin: 12.6 g/dL (ref 12.0–15.0)
MCH: 28.5 pg (ref 26.0–34.0)
MCHC: 30.9 g/dL (ref 30.0–36.0)
MCV: 92.3 fL (ref 80.0–100.0)
Platelets: 266 K/uL (ref 150–400)
RBC: 4.42 MIL/uL (ref 3.87–5.11)
RDW: 14.6 % (ref 11.5–15.5)
WBC: 5.2 K/uL (ref 4.0–10.5)
nRBC: 0 % (ref 0.0–0.2)

## 2024-01-03 LAB — SURGICAL PCR SCREEN
MRSA, PCR: NEGATIVE
Staphylococcus aureus: NEGATIVE

## 2024-01-08 ENCOUNTER — Ambulatory Visit (HOSPITAL_COMMUNITY): Payer: Self-pay | Admitting: Physician Assistant

## 2024-01-08 ENCOUNTER — Other Ambulatory Visit: Payer: Self-pay

## 2024-01-08 ENCOUNTER — Observation Stay (HOSPITAL_COMMUNITY)
Admission: RE | Admit: 2024-01-08 | Discharge: 2024-01-12 | Disposition: A | Discharge status: Home / Self Care | Attending: Orthopedic Surgery | Admitting: Orthopedic Surgery

## 2024-01-08 ENCOUNTER — Encounter (HOSPITAL_COMMUNITY): Payer: Self-pay | Admitting: Orthopedic Surgery

## 2024-01-08 ENCOUNTER — Ambulatory Visit (HOSPITAL_COMMUNITY): Admitting: Certified Registered Nurse Anesthetist

## 2024-01-08 ENCOUNTER — Encounter (HOSPITAL_COMMUNITY): Admission: RE | Disposition: A | Payer: Self-pay | Source: Home / Self Care | Attending: Orthopedic Surgery

## 2024-01-08 DIAGNOSIS — Z85828 Personal history of other malignant neoplasm of skin: Secondary | ICD-10-CM | POA: Insufficient documentation

## 2024-01-08 DIAGNOSIS — Z96611 Presence of right artificial shoulder joint: Secondary | ICD-10-CM | POA: Insufficient documentation

## 2024-01-08 DIAGNOSIS — M25562 Pain in left knee: Secondary | ICD-10-CM | POA: Diagnosis present

## 2024-01-08 DIAGNOSIS — M1712 Unilateral primary osteoarthritis, left knee: Principal | ICD-10-CM | POA: Insufficient documentation

## 2024-01-08 DIAGNOSIS — Z96651 Presence of right artificial knee joint: Secondary | ICD-10-CM | POA: Insufficient documentation

## 2024-01-08 DIAGNOSIS — Z96641 Presence of right artificial hip joint: Secondary | ICD-10-CM | POA: Diagnosis not present

## 2024-01-08 DIAGNOSIS — Z91040 Latex allergy status: Secondary | ICD-10-CM | POA: Insufficient documentation

## 2024-01-08 DIAGNOSIS — M150 Primary generalized (osteo)arthritis: Principal | ICD-10-CM

## 2024-01-08 DIAGNOSIS — G4733 Obstructive sleep apnea (adult) (pediatric): Secondary | ICD-10-CM

## 2024-01-08 DIAGNOSIS — M179 Osteoarthritis of knee, unspecified: Secondary | ICD-10-CM | POA: Diagnosis present

## 2024-01-08 DIAGNOSIS — I10 Essential (primary) hypertension: Secondary | ICD-10-CM | POA: Diagnosis not present

## 2024-01-08 DIAGNOSIS — Z7982 Long term (current) use of aspirin: Secondary | ICD-10-CM | POA: Insufficient documentation

## 2024-01-08 HISTORY — PX: TOTAL KNEE ARTHROPLASTY: SHX125

## 2024-01-08 SURGERY — ARTHROPLASTY, KNEE, TOTAL
Anesthesia: Spinal | Site: Knee | Laterality: Left

## 2024-01-08 MED ORDER — PHENOL 1.4 % MT LIQD: 1.0000 | OROMUCOSAL | Status: DC | PRN

## 2024-01-08 MED ORDER — POVIDONE-IODINE 10 % EX SWAB: 2.0000 | Freq: Once | CUTANEOUS | Status: DC

## 2024-01-08 MED ORDER — 0.9 % SODIUM CHLORIDE (POUR BTL) OPTIME
TOPICAL | Status: DC | PRN
  Administered 2024-01-08: 1000 mL

## 2024-01-08 MED ORDER — LACTATED RINGERS IV SOLN: INTRAVENOUS | Status: DC

## 2024-01-08 MED ORDER — TRANEXAMIC ACID-NACL 1000-0.7 MG/100ML-% IV SOLN
1000.0000 mg | INTRAVENOUS | Status: AC
  Administered 2024-01-08: 1000 mg via INTRAVENOUS
  Filled 2024-01-08: qty 100

## 2024-01-08 MED ORDER — DIPHENHYDRAMINE HCL 12.5 MG/5ML PO ELIX: 12.5000 mg | ORAL_SOLUTION | ORAL | Status: DC | PRN

## 2024-01-08 MED ORDER — METHOCARBAMOL 1000 MG/10ML IJ SOLN
500.0000 mg | Freq: Four times a day (QID) | INTRAMUSCULAR | Status: DC | PRN
Start: 2024-01-08 — End: 2024-01-12

## 2024-01-08 MED ORDER — BUPIVACAINE IN DEXTROSE 0.75-8.25 % IT SOLN
INTRATHECAL | Status: DC | PRN
  Administered 2024-01-08: 1.6 mL via INTRATHECAL

## 2024-01-08 MED ORDER — BISACODYL 10 MG RE SUPP: 10.0000 mg | Freq: Every day | RECTAL | Status: DC | PRN

## 2024-01-08 MED ORDER — FENTANYL CITRATE (PF) 50 MCG/ML IJ SOSY
PREFILLED_SYRINGE | INTRAMUSCULAR | Status: AC
  Filled 2024-01-08: qty 1

## 2024-01-08 MED ORDER — BUPIVACAINE LIPOSOME 1.3 % IJ SUSP
INTRAMUSCULAR | Status: AC
Start: 2024-01-08 — End: 2024-01-08
  Filled 2024-01-08: qty 20

## 2024-01-08 MED ORDER — FENOFIBRATE 160 MG PO TABS
160.0000 mg | ORAL_TABLET | Freq: Every day | ORAL | Status: DC
  Administered 2024-01-09 – 2024-01-12 (×4): 160 mg via ORAL
  Filled 2024-01-08 (×4): qty 1

## 2024-01-08 MED ORDER — ACETAMINOPHEN 500 MG PO TABS
1000.0000 mg | ORAL_TABLET | Freq: Four times a day (QID) | ORAL | Status: AC
  Administered 2024-01-09 (×3): 1000 mg via ORAL
  Filled 2024-01-08 (×3): qty 2

## 2024-01-08 MED ORDER — SODIUM CHLORIDE 0.9 % IV SOLN: INTRAVENOUS | Status: DC

## 2024-01-08 MED ORDER — FENTANYL CITRATE (PF) 50 MCG/ML IJ SOSY
25.0000 ug | PREFILLED_SYRINGE | INTRAMUSCULAR | Status: DC | PRN
  Administered 2024-01-08: 50 ug via INTRAVENOUS

## 2024-01-08 MED ORDER — FENTANYL CITRATE (PF) 50 MCG/ML IJ SOSY
PREFILLED_SYRINGE | INTRAMUSCULAR | Status: AC
  Administered 2024-01-08: 50 ug via INTRAVENOUS
  Filled 2024-01-08: qty 2

## 2024-01-08 MED ORDER — ONDANSETRON HCL 4 MG/2ML IJ SOLN: 4.0000 mg | Freq: Four times a day (QID) | INTRAMUSCULAR | Status: DC | PRN

## 2024-01-08 MED ORDER — ACETAMINOPHEN 325 MG PO TABS
325.0000 mg | ORAL_TABLET | Freq: Four times a day (QID) | ORAL | Status: DC | PRN
  Administered 2024-01-10 – 2024-01-11 (×3): 650 mg via ORAL
  Filled 2024-01-08 (×3): qty 2

## 2024-01-08 MED ORDER — OXYCODONE HCL 5 MG PO TABS
5.0000 mg | ORAL_TABLET | ORAL | Status: DC | PRN
  Administered 2024-01-08: 5 mg via ORAL
  Filled 2024-01-08: qty 1

## 2024-01-08 MED ORDER — POLYETHYLENE GLYCOL 3350 17 G PO PACK: 17.0000 g | PACK | Freq: Every day | ORAL | Status: DC | PRN

## 2024-01-08 MED ORDER — METOCLOPRAMIDE HCL 5 MG PO TABS: 5.0000 mg | ORAL_TABLET | Freq: Three times a day (TID) | ORAL | Status: DC | PRN

## 2024-01-08 MED ORDER — SODIUM CHLORIDE (PF) 0.9 % IJ SOLN
INTRAMUSCULAR | Status: DC | PRN
  Administered 2024-01-08: 80 mL

## 2024-01-08 MED ORDER — PROPOFOL 500 MG/50ML IV EMUL
INTRAVENOUS | Status: DC | PRN
  Administered 2024-01-08: 85 ug/kg/min via INTRAVENOUS

## 2024-01-08 MED ORDER — METHOCARBAMOL 500 MG PO TABS
500.0000 mg | ORAL_TABLET | Freq: Four times a day (QID) | ORAL | Status: DC | PRN
  Administered 2024-01-08 – 2024-01-12 (×10): 500 mg via ORAL
  Filled 2024-01-08 (×10): qty 1

## 2024-01-08 MED ORDER — ACETAMINOPHEN 10 MG/ML IV SOLN
1000.0000 mg | Freq: Four times a day (QID) | INTRAVENOUS | Status: DC
  Administered 2024-01-08: 1000 mg via INTRAVENOUS
  Filled 2024-01-08: qty 100

## 2024-01-08 MED ORDER — PANTOPRAZOLE SODIUM 40 MG PO TBEC
40.0000 mg | DELAYED_RELEASE_TABLET | Freq: Two times a day (BID) | ORAL | Status: DC
  Administered 2024-01-08 – 2024-01-12 (×8): 40 mg via ORAL
  Filled 2024-01-08 (×8): qty 1

## 2024-01-08 MED ORDER — MENTHOL 3 MG MT LOZG: 1.0000 | LOZENGE | OROMUCOSAL | Status: DC | PRN

## 2024-01-08 MED ORDER — DOCUSATE SODIUM 100 MG PO CAPS
100.0000 mg | ORAL_CAPSULE | Freq: Two times a day (BID) | ORAL | Status: DC
  Administered 2024-01-08 – 2024-01-12 (×8): 100 mg via ORAL
  Filled 2024-01-08 (×8): qty 1

## 2024-01-08 MED ORDER — PROPOFOL 10 MG/ML IV BOLUS
INTRAVENOUS | Status: DC | PRN
  Administered 2024-01-08 (×2): 30 mg via INTRAVENOUS

## 2024-01-08 MED ORDER — STERILE WATER FOR IRRIGATION IR SOLN
Status: DC | PRN
  Administered 2024-01-08: 2000 mL

## 2024-01-08 MED ORDER — CEFAZOLIN SODIUM-DEXTROSE 2-4 GM/100ML-% IV SOLN
2.0000 g | INTRAVENOUS | Status: AC
  Administered 2024-01-08: 2 g via INTRAVENOUS
  Filled 2024-01-08: qty 100

## 2024-01-08 MED ORDER — ONDANSETRON HCL 4 MG/2ML IJ SOLN: 4.0000 mg | Freq: Once | INTRAMUSCULAR | Status: DC | PRN

## 2024-01-08 MED ORDER — SODIUM CHLORIDE 0.9 % IR SOLN
Status: DC | PRN
  Administered 2024-01-08: 1000 mL

## 2024-01-08 MED ORDER — ONDANSETRON HCL 4 MG PO TABS: 4.0000 mg | ORAL_TABLET | Freq: Four times a day (QID) | ORAL | Status: DC | PRN

## 2024-01-08 MED ORDER — DEXAMETHASONE SOD PHOSPHATE PF 10 MG/ML IJ SOLN
10.0000 mg | Freq: Once | INTRAMUSCULAR | Status: AC
  Administered 2024-01-09: 10 mg via INTRAVENOUS

## 2024-01-08 MED ORDER — OXYCODONE HCL 5 MG PO TABS
10.0000 mg | ORAL_TABLET | ORAL | Status: DC | PRN
  Administered 2024-01-08 – 2024-01-10 (×9): 10 mg via ORAL
  Filled 2024-01-08 (×9): qty 2

## 2024-01-08 MED ORDER — METOCLOPRAMIDE HCL 5 MG/ML IJ SOLN: 5.0000 mg | Freq: Three times a day (TID) | INTRAMUSCULAR | Status: DC | PRN

## 2024-01-08 MED ORDER — FLEET ENEMA RE ENEM: 1.0000 | ENEMA | Freq: Once | RECTAL | Status: DC | PRN

## 2024-01-08 MED ORDER — DEXAMETHASONE SOD PHOSPHATE PF 10 MG/ML IJ SOLN
8.0000 mg | Freq: Once | INTRAMUSCULAR | Status: AC
  Administered 2024-01-08: 8 mg via INTRAVENOUS

## 2024-01-08 MED ORDER — FENTANYL CITRATE (PF) 50 MCG/ML IJ SOSY: 50.0000 ug | PREFILLED_SYRINGE | INTRAMUSCULAR | Status: DC

## 2024-01-08 MED ORDER — CEFAZOLIN SODIUM-DEXTROSE 2-4 GM/100ML-% IV SOLN
2.0000 g | Freq: Four times a day (QID) | INTRAVENOUS | Status: AC
  Administered 2024-01-08 – 2024-01-09 (×2): 2 g via INTRAVENOUS
  Filled 2024-01-08 (×2): qty 100

## 2024-01-08 MED ORDER — CHLORHEXIDINE GLUCONATE 0.12 % MT SOLN
15.0000 mL | Freq: Once | OROMUCOSAL | Status: AC
  Administered 2024-01-08: 15 mL via OROMUCOSAL

## 2024-01-08 MED ORDER — ORAL CARE MOUTH RINSE: 15.0000 mL | Freq: Once | OROMUCOSAL | Status: AC

## 2024-01-08 MED ORDER — SODIUM CHLORIDE (PF) 0.9 % IJ SOLN
INTRAMUSCULAR | Status: AC
  Filled 2024-01-08: qty 50

## 2024-01-08 MED ORDER — ASPIRIN 81 MG PO CHEW
81.0000 mg | CHEWABLE_TABLET | Freq: Two times a day (BID) | ORAL | Status: DC
  Administered 2024-01-09 – 2024-01-12 (×7): 81 mg via ORAL
  Filled 2024-01-08 (×7): qty 1

## 2024-01-08 MED ORDER — SODIUM CHLORIDE (PF) 0.9 % IJ SOLN
INTRAMUSCULAR | Status: AC
  Filled 2024-01-08: qty 10

## 2024-01-08 MED ORDER — ONDANSETRON HCL 4 MG/2ML IJ SOLN
INTRAMUSCULAR | Status: DC | PRN
  Administered 2024-01-08: 4 mg via INTRAVENOUS

## 2024-01-08 MED ORDER — BUPIVACAINE LIPOSOME 1.3 % IJ SUSP: 20.0000 mL | Freq: Once | INTRAMUSCULAR | Status: DC

## 2024-01-08 MED ORDER — HYDROMORPHONE HCL 1 MG/ML IJ SOLN
0.5000 mg | INTRAMUSCULAR | Status: DC | PRN
  Administered 2024-01-08 – 2024-01-09 (×3): 1 mg via INTRAVENOUS
  Filled 2024-01-08 (×3): qty 1

## 2024-01-08 MED ORDER — ROPIVACAINE HCL 5 MG/ML IJ SOLN
INTRAMUSCULAR | Status: DC | PRN
  Administered 2024-01-08: 20 mL via PERINEURAL

## 2024-01-08 SURGICAL SUPPLY — 43 items
ATTUNE PSFEM LTSZ5 NARCEM KNEE (Femur) IMPLANT
ATTUNE PSRP INSR SZ5 8 KNEE (Insert) IMPLANT
BAG COUNTER SPONGE SURGICOUNT (BAG) IMPLANT
BAG ZIPLOCK 12X15 (MISCELLANEOUS) ×2 IMPLANT
BASEPLATE TIBIAL ROTATING SZ 4 (Knees) IMPLANT
BLADE SAG 18X100X1.27 (BLADE) ×2 IMPLANT
BLADE SAW SGTL 11.0X1.19X90.0M (BLADE) ×2 IMPLANT
BNDG ELASTIC 4X5.8 VLCR NS LF (GAUZE/BANDAGES/DRESSINGS) IMPLANT
BNDG ELASTIC 6INX 5YD STR LF (GAUZE/BANDAGES/DRESSINGS) ×2 IMPLANT
BOWL SMART MIX CTS (DISPOSABLE) ×2 IMPLANT
CEMENT HV SMART SET (Cement) ×4 IMPLANT
COVER SURGICAL LIGHT HANDLE (MISCELLANEOUS) ×2 IMPLANT
CUFF TRNQT CYL 34X4.125X (TOURNIQUET CUFF) ×2 IMPLANT
DERMABOND ADVANCED .7 DNX12 (GAUZE/BANDAGES/DRESSINGS) ×2 IMPLANT
DRAPE U-SHAPE 47X51 STRL (DRAPES) ×2 IMPLANT
DRESSING AQUACEL AG SP 3.5X10 (GAUZE/BANDAGES/DRESSINGS) IMPLANT
DRSG AQUACEL AG ADV 3.5X10 (GAUZE/BANDAGES/DRESSINGS) ×2 IMPLANT
DURAPREP 26ML APPLICATOR (WOUND CARE) ×2 IMPLANT
ELECT REM PT RETURN 15FT ADLT (MISCELLANEOUS) ×2 IMPLANT
GLOVE BIO SURGEON STRL SZ 6.5 (GLOVE) IMPLANT
GLOVE BIO SURGEON STRL SZ8 (GLOVE) ×2 IMPLANT
GLOVE BIOGEL PI IND STRL 7.0 (GLOVE) ×2 IMPLANT
GLOVE BIOGEL PI IND STRL 8 (GLOVE) ×2 IMPLANT
GOWN STRL REUS W/ TWL LRG LVL3 (GOWN DISPOSABLE) ×2 IMPLANT
HOLDER FOLEY CATH W/STRAP (MISCELLANEOUS) ×2 IMPLANT
IMMOBILIZER KNEE 20 THIGH 36 (SOFTGOODS) ×2 IMPLANT
KIT TURNOVER KIT A (KITS) ×2 IMPLANT
MANIFOLD NEPTUNE II (INSTRUMENTS) ×2 IMPLANT
NS IRRIG 1000ML POUR BTL (IV SOLUTION) ×2 IMPLANT
PACK TOTAL KNEE CUSTOM (KITS) ×2 IMPLANT
PADDING CAST COTTON 6X4 STRL (CAST SUPPLIES) ×4 IMPLANT
PATELLA MEDIAL ATTUN 35MM KNEE (Knees) IMPLANT
PENCIL SMOKE EVACUATOR (MISCELLANEOUS) ×2 IMPLANT
PIN STEINMAN FIXATION KNEE (PIN) IMPLANT
PROTECTOR NERVE ULNAR (MISCELLANEOUS) ×2 IMPLANT
SET HNDPC FAN SPRY TIP SCT (DISPOSABLE) ×2 IMPLANT
SUT MNCRL AB 4-0 PS2 18 (SUTURE) ×2 IMPLANT
SUT VIC AB 2-0 CT1 TAPERPNT 27 (SUTURE) ×6 IMPLANT
SUTURE STRATFX 0 PDS 27 VIOLET (SUTURE) ×2 IMPLANT
TOWEL GREEN STERILE FF (TOWEL DISPOSABLE) ×2 IMPLANT
TRAY FOLEY MTR SLVR 16FR STAT (SET/KITS/TRAYS/PACK) ×2 IMPLANT
TUBE SUCTION HIGH CAP CLEAR NV (SUCTIONS) ×2 IMPLANT
WRAP KNEE MAXI GEL POST OP (GAUZE/BANDAGES/DRESSINGS) ×2 IMPLANT

## 2024-01-08 NOTE — Progress Notes (Signed)
 Orthopedic Tech Progress Note Patient Details:  Tracy [name] [birth year] [id number] Applied CPM per order. CPM Left Knee CPM Left Knee: On Left Knee Flexion (Degrees): 40 Left Knee Extension (Degrees): 10  Post Interventions Patient Tolerated: Well Instructions Provided: Adjustment of device, Care of device, Poper ambulation with device Ortho Devices Type of Ortho Device: CPM padding Ortho Device/Splint Location: LLE Ortho Device/Splint Interventions: Ordered, Application, Adjustment   Post Interventions Patient Tolerated: Well Instructions Provided: Adjustment of device, Care of device, Poper ambulation with device  Morna Pink 01/08/2024, 6:57 PM

## 2024-01-08 NOTE — Care Plan (Signed)
 Ortho Bundle Case Management Note  Patient Details  Name: Tracy [name] MRN: [id number] Date of Birth: Nov 03, 1950  LT TKA on 01/08/24  DCP: Home with husband  DME: No needs; has RW and cane  PT: EO                   DME Arranged:  N/A DME Agency:  NA  HH Arranged:    HH Agency:     Additional Comments: Please contact me with any questions of if this plan should need to change.  Burnard Dross, Case Manager [facility]  [phone]  Ext. [phone]   01/08/2024, 2:50 PM

## 2024-01-08 NOTE — Interval H&P Note (Signed)
 History and Physical Interval Note:  01/08/2024 12:42 PM  Tracy [name]  has presented today for surgery, with the diagnosis of Left knee osteoarthritis.  The various methods of treatment have been discussed with the patient and family. After consideration of risks, benefits and other options for treatment, the patient has consented to  Procedure(s): ARTHROPLASTY, KNEE, TOTAL (Left) as a surgical intervention.  The patient's history has been reviewed, patient examined, no change in status, stable for surgery.  I have reviewed the patient's chart and labs.  Questions were answered to the patient's satisfaction.     Dempsey Neldon Shepard

## 2024-01-08 NOTE — Op Note (Signed)
 OPERATIVE REPORT-TOTAL KNEE ARTHROPLASTY   Pre-operative diagnosis- Osteoarthritis  Left knee(s)  Post-operative diagnosis- Osteoarthritis Left knee(s)  Procedure-  Left  Total Knee Arthroplasty  Surgeon- Dempsey GAILS. Dayrin Stallone, MD  Assistant- Roxie Mess, PA-C   Anesthesia-  Adductor canal block and spinal  EBL-20 mL   Drains None  Tourniquet time-  Total Tourniquet Time Documented: Thigh (Left) - 28 minutes Total: Thigh (Left) - 28 minutes     Complications- None  Condition-PACU - hemodynamically stable.   Brief Clinical Note  Tracy [name] is a 73 y.o. 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 She has constant pain, with activity and at rest and significant functional deficits with difficulties even with ADLs. She has had extensive non-op management including analgesics, injections of cortisone and viscosupplements, and home exercise program, but remains in significant pain with significant dysfunction. Radiographs show bone on bone arthritis medial and patellofemoral. She presents now for left Total Knee Arthroplasty.     Procedure in detail---   The patient is brought into the operating room and positioned supine on the operating table. After successful administration of  Adductor canal block and spinal,   a tourniquet is placed high on the  Left thigh(s) and the lower extremity is prepped and draped in the usual sterile fashion. Time out is performed by the operating team and then the  Left lower extremity is wrapped in Esmarch, knee flexed and the tourniquet inflated to 300 mmHg.       A midline incision is made with a ten blade through the subcutaneous tissue to the level of the extensor mechanism. A fresh blade is used to make a medial parapatellar arthrotomy. Soft tissue over the proximal medial tibia is subperiosteally elevated to the joint line with a knife and into the semimembranosus bursa with a Cobb  elevator. Soft tissue over the proximal lateral tibia is elevated with attention being paid to avoiding the patellar tendon on the tibial tubercle. The patella is everted, knee flexed 90 degrees and the ACL and PCL are removed. Findings are bone on bone lateral and patellofemoral with large global osteophytes        The drill is used to create a starting hole in the distal femur and the canal is thoroughly irrigated with sterile saline to remove the fatty contents. The 5 degree Left  valgus alignment guide is placed into the femoral canal and the distal femoral cutting block is pinned to remove 10 mm off the distal femur. Resection is made with an oscillating saw.      The tibia is subluxed forward and the menisci are removed. The extramedullary alignment guide is placed referencing proximally at the medial aspect of the tibial tubercle and distally along the second metatarsal axis and tibial crest. The block is pinned to remove 2mm off the more deficient lateral  side. Resection is made with an oscillating saw. Size 4is the most appropriate size for the tibia and the proximal tibia is prepared with the modular drill and keel punch for that size.      The femoral sizing guide is placed and size 6 is most appropriate. Rotation is marked off the epicondylar axis and confirmed by creating a rectangular flexion gap at 90 degrees. The size 6 cutting block is pinned in this rotation and the anterior, posterior and chamfer cuts are made with the oscillating saw. The intercondylar block is then placed and that cut is made.  Trial size 4 tibial component, trial size 5 narrow posterior stabilized femur and a 8  mm posterior stabilized rotating platform insert trial is placed. Full extension is achieved with excellent varus/valgus and anterior/posterior balance throughout full range of motion. The patella is everted and thickness measured to be 22  mm. Free hand resection is taken to 12 mm, a 35 template is placed, lug  holes are drilled, trial patella is placed, and it tracks normally. Osteophytes are removed off the posterior femur with the trial in place. All trials are removed and the cut bone surfaces prepared with pulsatile lavage. Cement is mixed and once ready for implantation, the size 4 tibial implant, size  5 narrow posterior stabilized femoral component, and the size 35 patella are cemented in place and the patella is held with the clamp. The trial insert is placed and the knee held in full extension. The Exparel  (20 ml mixed with 60 ml saline) is injected into the extensor mechanism, posterior capsule, medial and lateral gutters and subcutaneous tissues.  All extruded cement is removed and once the cement is hard the permanent 8 mm posterior stabilized rotating platform insert is placed into the tibial tray.      The wound is copiously irrigated with saline solution and the extensor mechanism closed with # 0 Stratofix suture. The tourniquet is released for a total tourniquet time of 28 minutes. Flexion against gravity is 140 degrees and the patella tracks normally. Subcutaneous tissue is closed with 2.0 vicryl and subcuticular with running 4.0 Monocryl. The incision is cleaned and dried and steri-strips and a bulky sterile dressing are applied. The limb is placed into a knee immobilizer and the patient is awakened and transported to recovery in stable condition.      Please note that a surgical assistant was a medical necessity for this procedure in order to perform it in a safe and expeditious manner. Surgical assistant was necessary to retract the ligaments and vital neurovascular structures to prevent injury to them and also necessary for proper positioning of the limb to allow for anatomic placement of the prosthesis.   Dempsey ROCKFORD Lilinoe Acklin, MD    01/08/2024, 3:49 PM

## 2024-01-08 NOTE — Anesthesia Procedure Notes (Signed)
 Anesthesia Regional Block: Adductor canal block   Pre-Anesthetic Checklist: , timeout performed,  Correct Patient, Correct Site, Correct Laterality,  Correct Procedure, Correct Position, site marked,  Risks and benefits discussed,  Surgical consent,  Pre-op evaluation,  At surgeon's request and post-op pain management  Laterality: Left  Prep: chloraprep       Needles:  Injection technique: Single-shot  Needle Type: Echogenic Needle     Needle Length: 9cm  Needle Gauge: 21     Additional Needles:   Procedures:,,,, ultrasound used (permanent image in chart),,    Narrative:  Start time: 01/08/2024 2:20 PM End time: 01/08/2024 2:27 PM Injection made incrementally with aspirations every 5 mL.  Performed by: Personally  Anesthesiologist: Corinne Garnette BRAVO, MD  Additional Notes: No pain on injection. No increased resistance to injection. Injection made in 5cc increments.  Good needle visualization.  Patient tolerated procedure well.

## 2024-01-08 NOTE — Transfer of Care (Signed)
 Immediate Anesthesia Transfer of Care Note  Patient: Tracy [name]  Procedure(s) Performed: ARTHROPLASTY, KNEE, TOTAL (Left: Knee)  Patient Location: PACU  Anesthesia Type:Spinal  Level of Consciousness: awake  Airway & Oxygen Therapy: Patient Spontanous Breathing and Patient connected to nasal cannula oxygen  Post-op Assessment: Report given to RN and Post -op Vital signs reviewed and stable  Post vital signs: Reviewed and stable  Last Vitals:  Vitals Value Taken Time  BP    Temp    Pulse    Resp    SpO2      Last Pain:  Vitals:   01/08/24 1257  TempSrc:   PainSc: 3          Complications: No notable events documented.

## 2024-01-08 NOTE — Anesthesia Preprocedure Evaluation (Signed)
 Anesthesia Evaluation  Patient identified by MRN, date of birth, ID band Patient awake    Reviewed: Allergy & Precautions, NPO status , Patient's Chart, lab work & pertinent test results  History of Anesthesia Complications (+) PONV and history of anesthetic complications  Airway Mallampati: II  TM Distance: >3 FB Neck ROM: Full    Dental  (+) Teeth Intact, Dental Advisory Given   Pulmonary sleep apnea    Pulmonary exam normal breath sounds clear to auscultation       Cardiovascular hypertension, Normal cardiovascular exam Rhythm:Regular Rate:Normal     Neuro/Psych  PSYCHIATRIC DISORDERS Anxiety Depression     Neuromuscular disease    GI/Hepatic Neg liver ROS,GERD  Medicated,,  Endo/Other   Hyperthyroidism   Renal/GU negative Renal ROS     Musculoskeletal  (+) Arthritis  (Left knee osteoarthritis), Osteoarthritis,    Abdominal   Peds  Hematology negative hematology ROS (+) Plt 266k   Anesthesia Other Findings Day of surgery medications reviewed with the patient.  Reproductive/Obstetrics                              Anesthesia Physical Anesthesia Plan  ASA: 2  Anesthesia Plan: Spinal   Post-op Pain Management: Regional block* and Ofirmev  IV (intra-op)*   Induction: Intravenous  PONV Risk Score and Plan: 3 and TIVA, Dexamethasone  and Ondansetron   Airway Management Planned: Natural Airway and Simple Face Mask  Additional Equipment:   Intra-op Plan:   Post-operative Plan:   Informed Consent: I have reviewed the patients History and Physical, chart, labs and discussed the procedure including the risks, benefits and alternatives for the proposed anesthesia with the patient or authorized representative who has indicated his/her understanding and acceptance.     Dental advisory given  Plan Discussed with: CRNA  Anesthesia Plan Comments:         Anesthesia Quick  Evaluation

## 2024-01-08 NOTE — Anesthesia Postprocedure Evaluation (Signed)
 Anesthesia Post Note  Patient: Rollene DELENA Lyons  Procedure(s) Performed: ARTHROPLASTY, KNEE, TOTAL (Left: Knee)     Patient location during evaluation: PACU Anesthesia Type: Spinal Level of consciousness: awake, awake and alert and oriented Pain management: pain level controlled Vital Signs Assessment: post-procedure vital signs reviewed and stable Respiratory status: spontaneous breathing, nonlabored ventilation and respiratory function stable Cardiovascular status: blood pressure returned to baseline and stable Postop Assessment: no headache, no backache, spinal receding and no apparent nausea or vomiting Anesthetic complications: no   No notable events documented.  Last Vitals:  Vitals:   01/08/24 1800 01/08/24 1815  BP: (!) 163/65   Pulse: 66 65  Resp: 16 12  Temp:    SpO2: 97% 99%    Last Pain:  Vitals:   01/08/24 1815  TempSrc:   PainSc: 3                  Garnette FORBES Skillern

## 2024-01-08 NOTE — Discharge Instructions (Signed)
Tracy Arabian, MD Total Joint Specialist [facility] [address], [address] [place], [place] [address] [phone]  TOTAL KNEE REPLACEMENT POSTOPERATIVE DIRECTIONS    Knee Rehabilitation, Guidelines Following Surgery  Results after knee surgery are often greatly improved when you follow the exercise, range of motion and muscle strengthening exercises prescribed by your doctor. Safety measures are also important to protect the knee from further injury. If any of these exercises cause you to have increased pain or swelling in your knee joint, decrease the amount until you are comfortable again and slowly increase them. If you have problems or questions, call your caregiver or physical therapist for advice.   BLOOD CLOT PREVENTION Take an 81 mg Aspirin two times a day for three weeks following surgery. Then take an 81 mg Aspirin once a day for three weeks. Then discontinue Aspirin. You may resume your vitamins/supplements upon discharge from the hospital. Do not take any NSAIDs (Advil, Aleve, Ibuprofen, Meloxicam, etc.) until you are 3 weeks out from surgery.  HOME CARE INSTRUCTIONS  Remove items at home which could result in a fall. This includes throw rugs or furniture in walking pathways.  ICE to the affected knee as much as tolerated. Icing helps control swelling. If the swelling is well controlled you will be more comfortable and rehab easier. Continue to use ice on the knee for pain and swelling from surgery. You may notice swelling that will progress down to the foot and ankle. This is normal after surgery. Elevate the leg when you are not up walking on it.    Continue to use the breathing machine which will help keep your temperature down. It is common for your temperature to cycle up and down following surgery, especially at night when you are not up moving around and exerting yourself. The breathing machine keeps your lungs expanded and your temperature down. Do  not place pillow under the operative knee, focus on keeping the knee straight while resting  DIET You may resume your previous home diet once you are discharged from the hospital.  DRESSING / WOUND CARE / SHOWERING Keep your bulky bandage on for 2 days. On the third post-operative day you may remove the Ace bandage and gauze. There is a waterproof adhesive bandage on your skin which will stay in place until your first follow-up appointment. Once you remove this you will not need to place another bandage You may begin showering 3 days following surgery, but do not submerge the incision under water.  ACTIVITY For the first 5 days, the key is rest and control of pain and swelling Do your home exercises twice a day starting on post-operative day 3. On the days you go to physical therapy, just do the home exercises once that day. You should rest, ice and elevate the leg for 50 minutes out of every hour. Get up and walk/stretch for 10 minutes per hour. After 5 days you can increase your activity slowly as tolerated. Walk with your walker as instructed. Use the walker until you are comfortable transitioning to a cane. Walk with the cane in the opposite hand of the operative leg. You may discontinue the cane once you are comfortable and walking steadily. Avoid periods of inactivity such as sitting longer than an hour when not asleep. This helps prevent blood clots.  You may discontinue the knee immobilizer once you are able to perform a straight leg raise while lying down. You may resume a sexual relationship in one month  or when given the OK by your doctor.  You may return to work once you are cleared by your doctor.  Do not drive a car for 6 weeks or until released by your surgeon.  Do not drive while taking narcotics.  TED HOSE STOCKINGS Wear the elastic stockings on both legs for three weeks following surgery during the day. You may remove them at night for sleeping.  WEIGHT BEARING Weight  bearing as tolerated with assist device (walker, cane, etc) as directed, use it as long as suggested by your surgeon or therapist, typically at least 4-6 weeks.  POSTOPERATIVE CONSTIPATION PROTOCOL Constipation - defined medically as fewer than three stools per week and severe constipation as less than one stool per week.  One of the most common issues patients have following surgery is constipation.  Even if you have a regular bowel pattern at home, your normal regimen is likely to be disrupted due to multiple reasons following surgery.  Combination of anesthesia, postoperative narcotics, change in appetite and fluid intake all can affect your bowels.  In order to avoid complications following surgery, here are some recommendations in order to help you during your recovery period.  Colace (docusate) - Pick up an over-the-counter form of Colace or another stool softener and take twice a day as long as you are requiring postoperative pain medications.  Take with a full glass of water daily.  If you experience loose stools or diarrhea, hold the colace until you stool forms back up. If your symptoms do not get better within 1 week or if they get worse, check with your doctor. Dulcolax (bisacodyl) - Pick up over-the-counter and take as directed by the product packaging as needed to assist with the movement of your bowels.  Take with a full glass of water.  Use this product as needed if not relieved by Colace only.  MiraLax (polyethylene glycol) - Pick up over-the-counter to have on hand. MiraLax is a solution that will increase the amount of water in your bowels to assist with bowel movements.  Take as directed and can mix with a glass of water, juice, soda, coffee, or tea. Take if you go more than two days without a movement. Do not use MiraLax more than once per day. Call your doctor if you are still constipated or irregular after using this medication for 7 days in a row.  If you continue to have problems  with postoperative constipation, please contact the office for further assistance and recommendations.  If you experience "the worst abdominal pain ever" or develop nausea or vomiting, please contact the office immediatly for further recommendations for treatment.  ITCHING If you experience itching with your medications, try taking only a single pain pill, or even half a pain pill at a time.  You can also use Benadryl over the counter for itching or also to help with sleep.   MEDICATIONS See your medication summary on the "After Visit Summary" that the nursing staff will review with you prior to discharge.  You may have some home medications which will be placed on hold until you complete the course of blood thinner medication.  It is important for you to complete the blood thinner medication as prescribed by your surgeon.  Continue your approved medications as instructed at time of discharge.  PRECAUTIONS If you experience chest pain or shortness of breath - call 911 immediately for transfer to the hospital emergency department.  If you develop a fever greater that 101 F,  purulent drainage from wound, increased redness or drainage from wound, foul odor from the wound/dressing, or calf pain - CONTACT YOUR SURGEON.                                                   FOLLOW-UP APPOINTMENTS Make sure you keep all of your appointments after your operation with your surgeon and caregivers. You should call the office at the above phone number and make an appointment for approximately two weeks after the date of your surgery or on the date instructed by your surgeon outlined in the "After Visit Summary".  RANGE OF MOTION AND STRENGTHENING EXERCISES  Rehabilitation of the knee is important following a knee injury or an operation. After just a few days of immobilization, the muscles of the thigh which control the knee become weakened and shrink (atrophy). Knee exercises are designed to build up the tone and  strength of the thigh muscles and to improve knee motion. Often times heat used for twenty to thirty minutes before working out will loosen up your tissues and help with improving the range of motion but do not use heat for the first two weeks following surgery. These exercises can be done on a training (exercise) mat, on the floor, on a table or on a bed. Use what ever works the best and is most comfortable for you Knee exercises include:  Leg Lifts - While your knee is still immobilized in a splint or cast, you can do straight leg raises. Lift the leg to 60 degrees, hold for 3 sec, and slowly lower the leg. Repeat 10-20 times 2-3 times daily. Perform this exercise against resistance later as your knee gets better.  Quad and Hamstring Sets - Tighten up the muscle on the front of the thigh (Quad) and hold for 5-10 sec. Repeat this 10-20 times hourly. Hamstring sets are done by pushing the foot backward against an object and holding for 5-10 sec. Repeat as with quad sets.  Leg Slides: Lying on your back, slowly slide your foot toward your buttocks, bending your knee up off the floor (only go as far as is comfortable). Then slowly slide your foot back down until your leg is flat on the floor again. Angel Wings: Lying on your back spread your legs to the side as far apart as you can without causing discomfort.  A rehabilitation program following serious knee injuries can speed recovery and prevent re-injury in the future due to weakened muscles. Contact your doctor or a physical therapist for more information on knee rehabilitation.   POST-OPERATIVE OPIOID TAPER INSTRUCTIONS: It is important to wean off of your opioid medication as soon as possible. If you do not need pain medication after your surgery it is ok to stop day one. Opioids include: Codeine, Hydrocodone(Norco, Vicodin), Oxycodone(Percocet, oxycontin) and hydromorphone amongst others.  Long term and even short term use of opiods can  cause: Increased pain response Dependence Constipation Depression Respiratory depression And more.  Withdrawal symptoms can include Flu like symptoms Nausea, vomiting And more Techniques to manage these symptoms Hydrate well Eat regular healthy meals Stay active Use relaxation techniques(deep breathing, meditating, yoga) Do Not substitute Alcohol to help with tapering If you have been on opioids for less than two weeks and do not have pain than it is ok to stop all together.  Plan  to wean off of opioids This plan should start within one week post op of your joint replacement. Maintain the same interval or time between taking each dose and first decrease the dose.  Cut the total daily intake of opioids by one tablet each day Next start to increase the time between doses. The last dose that should be eliminated is the evening dose.   IF YOU ARE TRANSFERRED TO A SKILLED REHAB FACILITY If the patient is transferred to a skilled rehab facility following release from the hospital, a list of the current medications will be sent to the facility for the patient to continue.  When discharged from the skilled rehab facility, please have the facility set up the patient's Home Health Physical Therapy prior to being released. Also, the skilled facility will be responsible for providing the patient with their medications at time of release from the facility to include their pain medication, the muscle relaxants, and their blood thinner medication. If the patient is still at the rehab facility at time of the two week follow up appointment, the skilled rehab facility will also need to assist the patient in arranging follow up appointment in our office and any transportation needs.  MAKE SURE YOU:  Understand these instructions.  Get help right away if you are not doing well or get worse.   DENTAL ANTIBIOTICS:  In most cases prophylactic antibiotics for Dental procdeures after total joint surgery are  not necessary.  Exceptions are as follows:  1. History of prior total joint infection  2. Severely immunocompromised (Organ Transplant, cancer chemotherapy, Rheumatoid biologic meds such as Humera)  3. Poorly controlled diabetes (A1C &gt; 8.0, blood glucose over 200)  If you have one of these conditions, contact your surgeon for an antibiotic prescription, prior to your dental procedure.    Pick up stool softner and laxative for home use following surgery while on pain medications. Do not submerge incision under water. Please use good hand washing techniques while changing dressing each day. May shower starting three days after surgery. Please use a clean towel to pat the incision dry following showers. Continue to use ice for pain and swelling after surgery. Do not use any lotions or creams on the incision until instructed by your surgeon.  

## 2024-01-08 NOTE — Anesthesia Procedure Notes (Signed)
 Procedure Name: MAC Date/Time: 01/08/2024 2:37 PM  Performed by: Judythe Tanda Aran, CRNAPre-anesthesia Checklist: Patient identified, Emergency Drugs available, Suction available and Patient being monitored Patient Re-evaluated:Patient Re-evaluated prior to induction Oxygen Delivery Method: Simple face mask

## 2024-01-08 NOTE — Anesthesia Procedure Notes (Signed)
 Spinal  Patient location during procedure: OR Start time: 01/08/2024 2:39 PM End time: 01/08/2024 2:43 PM Reason for block: surgical anesthesia Staffing Performed: anesthesiologist  Anesthesiologist: Corinne Garnette BRAVO, MD Performed by: Corinne Garnette BRAVO, MD Authorized by: Corinne Garnette BRAVO, MD   Preanesthetic Checklist Completed: patient identified, IV checked, risks and benefits discussed, surgical consent, monitors and equipment checked, pre-op evaluation and timeout performed Spinal Block Patient position: sitting Prep: DuraPrep and site prepped and draped Patient monitoring: continuous pulse ox and blood pressure Approach: midline Location: L3-4 Injection technique: single-shot Needle Needle type: Pencan  Needle gauge: 24 G Assessment Events: CSF return Additional Notes Functioning IV was confirmed and monitors were applied. Sterile prep and drape, including hand hygiene, mask and sterile gloves were used. The patient was positioned and the spine was prepped. The skin was anesthetized with lidocaine .  Free flow of clear CSF was obtained prior to injecting local anesthetic into the CSF.  The spinal needle aspirated freely following injection.  The needle was carefully withdrawn.  The patient tolerated the procedure well. Consent was obtained prior to procedure with all questions answered and concerns addressed. Risks including but not limited to bleeding, infection, nerve damage, paralysis, failed block, inadequate analgesia, allergic reaction, high spinal, itching and headache were discussed and the patient wished to proceed.   Garnette Corinne, MD

## 2024-01-09 ENCOUNTER — Encounter (HOSPITAL_COMMUNITY): Payer: Self-pay | Admitting: Orthopedic Surgery

## 2024-01-09 ENCOUNTER — Encounter (HOSPITAL_COMMUNITY)

## 2024-01-09 ENCOUNTER — Other Ambulatory Visit (HOSPITAL_COMMUNITY): Payer: Self-pay

## 2024-01-09 DIAGNOSIS — M1712 Unilateral primary osteoarthritis, left knee: Secondary | ICD-10-CM | POA: Diagnosis not present

## 2024-01-09 LAB — CBC
HCT: 36.8 % (ref 36.0–46.0)
Hemoglobin: 11.8 g/dL — ABNORMAL LOW (ref 12.0–15.0)
MCH: 29.5 pg (ref 26.0–34.0)
MCHC: 32.1 g/dL (ref 30.0–36.0)
MCV: 92 fL (ref 80.0–100.0)
Platelets: 225 K/uL (ref 150–400)
RBC: 4 MIL/uL (ref 3.87–5.11)
RDW: 14.2 % (ref 11.5–15.5)
WBC: 8.9 K/uL (ref 4.0–10.5)
nRBC: 0 % (ref 0.0–0.2)

## 2024-01-09 LAB — BASIC METABOLIC PANEL WITH GFR
Anion gap: 10 (ref 5–15)
BUN: 19 mg/dL (ref 8–23)
CO2: 24 mmol/L (ref 22–32)
Calcium: 9 mg/dL (ref 8.9–10.3)
Chloride: 110 mmol/L (ref 98–111)
Creatinine, Ser: 0.88 mg/dL (ref 0.44–1.00)
GFR, Estimated: >60 mL/min (ref >60)
Glucose, Bld: 145 mg/dL — ABNORMAL HIGH (ref 70–99)
Potassium: 4.4 mmol/L (ref 3.5–5.1)
Sodium: 143 mmol/L (ref 135–145)

## 2024-01-09 MED ORDER — ASPIRIN 81 MG PO CHEW
81.0000 mg | CHEWABLE_TABLET | Freq: Two times a day (BID) | ORAL | 0 refills | Status: AC
  Filled 2024-01-09: qty 63, 42d supply, fill #0

## 2024-01-09 MED ORDER — OXYCODONE HCL 5 MG PO TABS
5.0000 mg | ORAL_TABLET | ORAL | 0 refills | Status: DC | PRN
  Filled 2024-01-09: qty 42, 7d supply, fill #0

## 2024-01-09 MED ORDER — ONDANSETRON HCL 4 MG PO TABS
4.0000 mg | ORAL_TABLET | Freq: Four times a day (QID) | ORAL | 0 refills | Status: DC | PRN
  Filled 2024-01-09: qty 20, 5d supply, fill #0

## 2024-01-09 MED ORDER — CALCIUM CARBONATE ANTACID 500 MG PO CHEW
1.0000 | CHEWABLE_TABLET | Freq: Three times a day (TID) | ORAL | Status: DC | PRN
  Administered 2024-01-09 – 2024-01-10 (×2): 200 mg via ORAL
  Filled 2024-01-09 (×2): qty 1

## 2024-01-09 MED ORDER — METHOCARBAMOL 500 MG PO TABS
500.0000 mg | ORAL_TABLET | Freq: Four times a day (QID) | ORAL | 0 refills | Status: DC | PRN
  Filled 2024-01-09: qty 40, 10d supply, fill #0

## 2024-01-09 NOTE — Plan of Care (Signed)

## 2024-01-09 NOTE — TOC Transition Note (Addendum)
 Transition of Care [facility]) - Discharge Note   Patient Details  Name: Tracy [name] MRN: [id number] Date of Birth: 08/02/1950  [facility]) CM/SW Contact:  Alfonse JONELLE Rex, RN Phone Number: 01/09/2024, 1:08 PM   Clinical Narrative: Patient admitted on 11/10 for scheduled L TKA, OPPT-EO, has RW, no INPT CM needs.     3:25pm MOON completed.     Final next level of care: OP Rehab Barriers to Discharge: No Barriers Identified   Patient Goals and CMS Choice Patient states their goals for this hospitalization and ongoing recovery are:: return home          Discharge Placement                       Discharge Plan and Services Additional resources added to the After Visit Summary for                  DME Arranged: N/A DME Agency: NA                  Social Drivers of Health (SDOH) Interventions SDOH Screenings   Food Insecurity: No Food Insecurity (01/08/2024)  Housing: Low Risk  (01/08/2024)  Transportation Needs: No Transportation Needs (01/09/2024)  Utilities: Not At Risk (01/09/2024)  Alcohol  Screen: Low Risk  (10/17/2023)  Depression (PHQ2-9): Low Risk  (11/10/2023)  Recent Concern: Depression (PHQ2-9) - Medium Risk (10/09/2023)  Financial Resource Strain: Low Risk  (10/17/2023)  Physical Activity: Insufficiently Active (10/17/2023)  Social Connections: Moderately Integrated (01/09/2024)  Stress: Stress Concern Present (10/17/2023)  Tobacco Use: Low Risk  (01/08/2024)  Health Literacy: Adequate Health Literacy (10/17/2023)     Readmission Risk Interventions     No data to display

## 2024-01-09 NOTE — Progress Notes (Signed)
 PT TX NOTE   01/09/24 1518  PT Visit Information  Last PT Received On 01/09/24  Assistance Needed Pt tolerated session well however was limited by pain and fatigue. Incr time needed to ascend/descend steps d/t pain, +2 for safety. Pt will benefit from another day to allow safe d/c home and meet PT goals.   History of Present Illness 73 yo female presents to therapy s/p L TKA, anterior approach on 01/08/24.  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  Precautions  Precautions Fall;Knee  Recall of Precautions/Restrictions Intact  Restrictions  Weight Bearing Restrictions Per Provider Order No  LLE Weight Bearing Per Provider Order WBAT  Pain Assessment  Pain Assessment Faces  Faces Pain Scale 8  Pain Location L knee  Pain Descriptors / Indicators Sore;Discomfort;Grimacing;Guarding  Pain Intervention(s) Limited activity within patient's tolerance;Monitored during session;Premedicated before session;Repositioned;Ice applied  Cognition  Arousal Alert  Behavior During Therapy WFL for tasks assessed/performed  PT - Cognitive impairments No apparent impairments  Following Commands  Following commands Intact  Cueing  Cueing Techniques Verbal cues  Communication  Communication No apparent difficulties  Bed Mobility  General bed mobility comments in recliner and returned to same  Transfers  Overall transfer level Needs assistance  Equipment used Rolling walker (2 wheels)  Transfers Sit to/from Stand  Sit to Stand Min assist;Contact guard assist  General transfer comment cues for hand placement and LLE position  Ambulation/Gait  Ambulation/Gait assistance Contact guard assist  Gait Distance (Feet) 40 Feet  Assistive device Rolling walker (2 wheels)  Gait Pattern/deviations Step-to pattern;Antalgic;Trunk  flexed;Decreased stance time - left  General Gait Details cues for sequence, use of UEs to off load LLE for improved pain control. distance ltd by pain and  fatigue  Stairs Yes  Stairs assistance Min assist;+2 safety/equipment  Stair Management Two rails;Forwards  Number of Stairs 2  General stair comments cues for sequence and technique. incr time needed. +2 for saety  Total Joint Exercises  Ankle Circles/Pumps AROM;Both;10 reps  Heel Slides AAROM;Left;10 reps  PT - End of Session  Equipment Utilized During Treatment Gait belt  Activity Tolerance Patient tolerated treatment well;Patient limited by pain  Patient left in chair;with call bell/phone within reach;with chair alarm set  Nurse Communication Mobility status   PT - Assessment/Plan  PT Visit Diagnosis Other abnormalities of gait and mobility (R26.89)  PT Frequency (ACUTE ONLY) 7X/week  Follow Up Recommendations Follow physician's recommendations for discharge plan and follow up therapies  Patient can return home with the following A little help with walking and/or transfers;A little help with bathing/dressing/bathroom;Help with stairs or ramp for entrance;Assistance with cooking/housework;Assist for transportation  PT equipment None recommended by PT  AM-PAC PT 6 Clicks Mobility Outcome Measure (Version 2)  Help needed turning from your back to your side while in a flat bed without using bedrails? 3  Help needed moving from lying on your back to sitting on the side of a flat bed without using bedrails? 3  Help needed moving to and from a bed to a chair (including a wheelchair)? 3  Help needed standing up from a chair using your arms (e.g., wheelchair or bedside chair)? 3  Help needed to walk in hospital room? 3  Help needed climbing 3-5 steps with a railing?  2  6 Click Score  17  Consider Recommendation of Discharge To: Home with [facility]  Progressive Mobility  Activity Turned to back - supine;Moved bed into chair position  PT Goal  Progression  Progress towards PT goals Progressing toward goals  Acute Rehab PT Goals  PT Goal Formulation With patient  Time For Goal Achievement 01/16/24  Potential to Achieve Goals Good  PT Time Calculation  PT Start Time (ACUTE ONLY) 1428  PT Stop Time (ACUTE ONLY) 1451  PT Time Calculation (min) (ACUTE ONLY) 23 min  PT General Charges  $$ ACUTE PT VISIT 1 Visit  PT Treatments  $Gait Training 23-37 mins  Rexene, PT  Acute Rehab Dept [facility]) [phone]  01/09/2024

## 2024-01-09 NOTE — Evaluation (Signed)
 Physical Therapy Evaluation Patient Details Name: Tracy [name] MRN: [id number] DOB: 1950/04/16 Today's Date: 01/09/2024  History of Present Illness  73 yo female presents to therapy s/p L TKA, anterior approach on 01/08/24.  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  Clinical Impression  Pt is s/p TKA resulting in the deficits listed below (see PT Problem List).  Pt amb 30' with, RW, CGA- min assist. Distance ltd by pain and fatigue; will see again in pm, pt will likely be ready to d/c after second session  Pt will benefit from acute skilled PT to increase their independence and safety with mobility to allow discharge.          If plan is discharge home, recommend the following: A little help with walking and/or transfers;A little help with bathing/dressing/bathroom;Help with stairs or ramp for entrance;Assistance with cooking/housework;Assist for transportation   Can travel by private vehicle        Equipment Recommendations None recommended by PT  Recommendations for Other Services       Functional Status Assessment Patient has had a recent decline in their functional status and demonstrates the ability to make significant improvements in function in a reasonable and predictable amount of time.     Precautions / Restrictions Precautions Precautions: Fall;Knee Restrictions Weight Bearing Restrictions Per Provider Order: No LLE Weight Bearing Per Provider Order: Weight bearing as tolerated      Mobility  Bed Mobility Overal bed mobility: Needs Assistance Bed Mobility: Supine to Sit     Supine to sit: Supervision, Contact guard     General bed mobility comments: for safety    Transfers Overall transfer level: Needs assistance Equipment used: Rolling walker (2 wheels) Transfers: Sit  to/from Stand Sit to Stand: Min assist           General transfer comment: cues for hand placement and LLE position    Ambulation/Gait Ambulation/Gait assistance: Contact guard assist Gait Distance (Feet): 30 Feet Assistive device: Rolling walker (2 wheels) Gait Pattern/deviations: Step-to pattern, Antalgic, Trunk flexed, Decreased stance time - left       General Gait Details: cues for sequence, use of UEs to off load LLE for improved pain control. distance ltd by pain and UE fatigue  Stairs            Wheelchair Mobility     Tilt Bed    Modified Rankin (Stroke Patients Only)       Balance Overall balance assessment: No apparent balance deficits (not formally assessed)                                           Pertinent Vitals/Pain Pain Assessment Pain Assessment: Faces Faces Pain Scale: Hurts whole lot Pain Location: L knee Pain Descriptors / Indicators: Sore, Discomfort, Grimacing, Guarding Pain Intervention(s): Limited activity within patient's tolerance, Monitored during session, Premedicated before session, Repositioned, Ice applied    Home Living Family/patient expects to be discharged to:: Private residence Living Arrangements: Spouse/significant other Available Help at Discharge: Family Type of Home: House Home Access: Stairs to enter   Secretary/administrator of Steps: 1 Alternate Level Stairs-Number of Steps: 12 Home Layout: Two level;Bed/bath upstairs Home Equipment: Agricultural Consultant (2 wheels);BSC/3in1;Cane -  single point      Prior Function Prior Level of Function : Independent/Modified Independent             Mobility Comments: amb with cane prior to surgery       Extremity/Trunk Assessment   Upper Extremity Assessment Upper Extremity Assessment: Overall WFL for tasks assessed    Lower Extremity Assessment Lower Extremity Assessment: LLE deficits/detail LLE Deficits / Details: ankle WFL, ~ 15 degree quad  lag. anticiapted post op deficits LLE: Unable to fully assess due to pain       Communication   Communication Communication: No apparent difficulties    Cognition Arousal: Alert Behavior During Therapy: WFL for tasks assessed/performed   PT - Cognitive impairments: No apparent impairments                         Following commands: Intact       Cueing       General Comments      Exercises Total Joint Exercises Ankle Circles/Pumps: AROM, Both, 10 reps   Assessment/Plan    PT Assessment Patient needs continued PT services  PT Problem List Decreased strength;Decreased range of motion;Decreased activity tolerance;Decreased balance;Decreased knowledge of use of DME;Pain;Decreased mobility       PT Treatment Interventions DME instruction;Therapeutic exercise;Gait training;Stair training;Functional mobility training;Therapeutic activities;Patient/family education    PT Goals (Current goals can be found in the Care Plan section)  Acute Rehab PT Goals PT Goal Formulation: With patient Time For Goal Achievement: 01/16/24 Potential to Achieve Goals: Good    Frequency 7X/week     Co-evaluation               AM-PAC PT 6 Clicks Mobility  Outcome Measure Help needed turning from your back to your side while in a flat bed without using bedrails?: A Little Help needed moving from lying on your back to sitting on the side of a flat bed without using bedrails?: A Little Help needed moving to and from a bed to a chair (including a wheelchair)?: A Little Help needed standing up from a chair using your arms (e.g., wheelchair or bedside chair)?: A Little Help needed to walk in hospital room?: A Little Help needed climbing 3-5 steps with a railing? : A Lot 6 Click Score: 17    End of Session Equipment Utilized During Treatment: Gait belt Activity Tolerance: Patient tolerated treatment well Patient left: in chair;with call bell/phone within reach;with chair  alarm set Nurse Communication: Mobility status PT Visit Diagnosis: Other abnormalities of gait and mobility (R26.89)    Time: 0950-1008 PT Time Calculation (min) (ACUTE ONLY): 18 min   Charges:   PT Evaluation $PT Eval Low Complexity: 1 Low   PT General Charges $$ ACUTE PT VISIT: 1 Visit         Marcy Bogosian, PT  Acute Rehab Dept [facility]) [phone]  01/09/2024   [facility] 01/09/2024, 10:45 AM

## 2024-01-09 NOTE — Plan of Care (Signed)

## 2024-01-09 NOTE — Progress Notes (Signed)
 Subjective: 1 Day Post-Op Procedure(s) (LRB): ARTHROPLASTY, KNEE, TOTAL (Left) Patient reports pain as mild.   Patient seen in rounds by Dr. Melodi. Patient is well, and has had no acute complaints or problems No issues overnight. Denies chest pain, SOB, or calf pain. Foley catheter removed this AM.  We will begin therapy today  Objective: Vital signs in last 24 hours: Temp:  [97.1 F (36.2 C)-98.2 F (36.8 C)] 97.3 F (36.3 C) (11/11 0543) Pulse Rate:  [56-86] 86 (11/11 0543) Resp:  [12-20] 16 (11/11 0543) BP: (119-163)/(63-93) 147/78 (11/11 0543) SpO2:  [94 %-100 %] 100 % (11/11 0543) Weight:  [72.6 kg] 72.6 kg (11/10 1257)  Intake/Output from previous day:  Intake/Output Summary (Last 24 hours) at 01/09/2024 0803 Last data filed at 01/09/2024 0556 Gross per 24 hour  Intake 2118.26 ml  Output 1920 ml  Net 198.26 ml     Intake/Output this shift: No intake/output data recorded.  Labs: Recent Labs    01/09/24 0400  HGB 11.8*   Recent Labs    01/09/24 0400  WBC 8.9  RBC 4.00  HCT 36.8  PLT 225   Recent Labs    01/09/24 0400  NA 143  K 4.4  CL 110  CO2 24  BUN 19  CREATININE 0.88  GLUCOSE 145*  CALCIUM  9.0   No results for input(s): LABPT, INR in the last 72 hours.  Exam: General - Patient is Alert and Oriented Extremity - Neurologically intact Neurovascular intact Sensation intact distally Dorsiflexion/Plantar flexion intact Dressing - dressing C/D/I Motor Function - intact, moving foot and toes well on exam.   Past Medical History:  Diagnosis Date   Allergy 2000   Latex rash   Anxiety 2015   since retirement   Arthritis 2011   Osteoarthritis   Bursitis of left hip    C. difficile diarrhea    Cancer (HCC) 05/11/2017   basil cell on right leg   Cataract 12/05/2018   bilateral   Complication of anesthesia    Depression 2018   Chronic pain   Diverticulitis    GERD (gastroesophageal reflux disease) 10   Hx: UTI (urinary tract  infection)    Hyperlipidemia ?   Hypertension    Hyperthyroidism    no meds per patient   Neuromuscular disorder (HCC) 2019   weakness in legs, legs & feet numb & tingle at night in bed   Osteopenia    Osteoporosis 2011   PONV (postoperative nausea and vomiting)    Rosacea    Rotator cuff arthropathy, left    Sleep apnea 06/03/2020   Chronic Obstructive Sleep Apnea - Dr. Shellia    Assessment/Plan: 1 Day Post-Op Procedure(s) (LRB): ARTHROPLASTY, KNEE, TOTAL (Left) Principal Problem:   OA (osteoarthritis) of knee Active Problems:   Primary osteoarthritis of left knee  Estimated body mass index is 27.47 kg/m as calculated from the following:   Height as of this encounter: 5' 4 (1.626 m).   Weight as of this encounter: 72.6 kg. Advance diet Up with therapy D/C IV fluids   Patient's anticipated LOS is less than 2 midnights, meeting these requirements: - Lives within 1 hour of care - Has a competent adult at home to recover with post-op recover - NO history of  - Chronic pain requiring opiods  - Diabetes  - Coronary Artery Disease  - Heart failure  - Heart attack  - Stroke  - DVT/VTE  - Cardiac arrhythmia  - Respiratory Failure/COPD  - Renal  failure  - Anemia  - Advanced Liver disease  DVT Prophylaxis - Aspirin  Weight bearing as tolerated. Continue therapy.  Plan is to go Home after hospital stay. Plan for discharge later today if progresses with therapy and meeting goals. Scheduled for OPPT at [facility]. Follow-up in the office in 2 weeks.  The PDMP database was reviewed today prior to any opioid medications being prescribed to this patient.  Roxie Mess, PA-C Orthopedic Surgery [phone] 01/09/2024, 8:03 AM

## 2024-01-09 NOTE — Care Management Obs Status (Signed)
 MEDICARE OBSERVATION STATUS NOTIFICATION   Patient Details  Name: Tracy [name] MRN: [id number] Date of Birth: 1950-10-21   Medicare Observation Status Notification Given:       Alfonse JONELLE Rex, RN 01/09/2024, 3:23 PM

## 2024-01-10 ENCOUNTER — Other Ambulatory Visit (HOSPITAL_COMMUNITY): Payer: Self-pay

## 2024-01-10 DIAGNOSIS — M1712 Unilateral primary osteoarthritis, left knee: Secondary | ICD-10-CM | POA: Diagnosis not present

## 2024-01-10 LAB — CBC
HCT: 33.6 % — ABNORMAL LOW (ref 36.0–46.0)
Hemoglobin: 11.2 g/dL — ABNORMAL LOW (ref 12.0–15.0)
MCH: 29.6 pg (ref 26.0–34.0)
MCHC: 33.3 g/dL (ref 30.0–36.0)
MCV: 88.9 fL (ref 80.0–100.0)
Platelets: 208 K/uL (ref 150–400)
RBC: 3.78 MIL/uL — ABNORMAL LOW (ref 3.87–5.11)
RDW: 14.3 % (ref 11.5–15.5)
WBC: 9 K/uL (ref 4.0–10.5)
nRBC: 0 % (ref 0.0–0.2)

## 2024-01-10 MED ORDER — HYDROMORPHONE HCL 2 MG PO TABS
2.0000 mg | ORAL_TABLET | Freq: Four times a day (QID) | ORAL | 0 refills | Status: DC | PRN
  Filled 2024-01-10: qty 42, 6d supply, fill #0

## 2024-01-10 MED ORDER — HYDROMORPHONE HCL 2 MG PO TABS
2.0000 mg | ORAL_TABLET | ORAL | Status: DC | PRN
  Administered 2024-01-10 – 2024-01-11 (×5): 2 mg via ORAL
  Administered 2024-01-11 – 2024-01-12 (×2): 4 mg via ORAL
  Filled 2024-01-10 (×3): qty 1
  Filled 2024-01-10: qty 2
  Filled 2024-01-10: qty 1
  Filled 2024-01-10 (×2): qty 2

## 2024-01-10 NOTE — Progress Notes (Signed)
 Physical Therapy Treatment Patient Details Name: Tracy [name] MRN: [id number] DOB: 11/30/1950 Today's Date: 01/10/2024   History of Present Illness 73 yo female presents to therapy s/p L TKA, anterior approach on 01/08/24.  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    PT Comments  Pt agreeable to PT however c/o severe pain L knee. Pt was premedicated with 10mg  oxycodone  prior to PT session.  Pt amb ~ 25' with RW, antalgic gait and min assist; attempted to ascend stairs however unable to complete d/t elevated pain; provided with ice to knee and warm blanket EOS (pt shaking with pain). RN updated.   If plan is discharge home, recommend the following: A little help with walking and/or transfers;A little help with bathing/dressing/bathroom;Help with stairs or ramp for entrance;Assistance with cooking/housework;Assist for transportation   Can travel by private vehicle        Equipment Recommendations  None recommended by PT    Recommendations for Other Services       Precautions / Restrictions Precautions Precautions: Fall;Knee Recall of Precautions/Restrictions: Intact Restrictions Weight Bearing Restrictions Per Provider Order: No LLE Weight Bearing Per Provider Order: Weight bearing as tolerated     Mobility  Bed Mobility Overal bed mobility: Needs Assistance Bed Mobility: Supine to Sit     Supine to sit: Supervision, Contact guard     General bed mobility comments: incr time, pt able to self assist LLE wtih leg lifter    Transfers Overall transfer level: Needs assistance Equipment used: Rolling walker (2 wheels) Transfers: Sit to/from Stand Sit to Stand: Contact guard assist, Min assist           General transfer comment: cues for hand placement and LLE position; incr tim needed d/t  pain    Ambulation/Gait Ambulation/Gait assistance: Min assist Gait Distance (Feet): 25 Feet Assistive device: Rolling walker (2 wheels) Gait Pattern/deviations: Step-to pattern, Antalgic, Trunk flexed, Decreased stance time - left, Decreased weight shift to left       General Gait Details: cues for sequence, use of UEs to off load LLE for improved pain control. distance ltd by pain   Stairs         General stair comments: attempted, unable d/t pain   Wheelchair Mobility     Tilt Bed    Modified Rankin (Stroke Patients Only)       Balance                                            Communication Communication Communication: No apparent difficulties  Cognition Arousal: Alert Behavior During Therapy: WFL for tasks assessed/performed   PT - Cognitive impairments: No apparent impairments                         Following commands: Intact      Cueing Cueing Techniques: Verbal cues  Exercises Total Joint Exercises Ankle Circles/Pumps: AROM, Both, 10 reps Quad Sets: AROM, Both, 5 reps Heel Slides: AAROM, Left, 10 reps, Limitations Heel Slides Limitations: pain, incr time; incr muscle guarding    General Comments        Pertinent Vitals/Pain Pain Assessment Pain Assessment: 0-10 Pain Score: 10-Worst pain  ever Faces Pain Scale: Hurts whole lot Pain Location: L knee Pain Descriptors / Indicators: Sore, Grimacing, Guarding, Sharp, Constant (shaking) Pain Intervention(s): Limited activity within patient's tolerance, Monitored during session, Premedicated before session, Repositioned, Ice applied, Utilized relaxation techniques (warm blanket)    Home Living                          Prior Function            PT Goals (current goals can now be found in the care plan section) Acute Rehab PT Goals PT Goal Formulation: With patient Time For Goal Achievement: 01/16/24 Potential to Achieve Goals: Good Progress towards  PT goals: Progressing toward goals    Frequency    7X/week      PT Plan      Co-evaluation              AM-PAC PT 6 Clicks Mobility   Outcome Measure  Help needed turning from your back to your side while in a flat bed without using bedrails?: A Little Help needed moving from lying on your back to sitting on the side of a flat bed without using bedrails?: A Little Help needed moving to and from a bed to a chair (including a wheelchair)?: A Little Help needed standing up from a chair using your arms (e.g., wheelchair or bedside chair)?: A Little Help needed to walk in hospital room?: A Little Help needed climbing 3-5 steps with a railing? : A Lot 6 Click Score: 17    End of Session Equipment Utilized During Treatment: Gait belt Activity Tolerance: Patient limited by pain Patient left: in chair;with call bell/phone within reach;with chair alarm set;with family/visitor present Nurse Communication: Mobility status PT Visit Diagnosis: Other abnormalities of gait and mobility (R26.89)     Time: 8874-8846 PT Time Calculation (min) (ACUTE ONLY): 28 min  Charges:    $Gait Training: 8-22 mins $Therapeutic Exercise: 8-22 mins PT General Charges $$ ACUTE PT VISIT: 1 Visit                     Rexene, PT  Acute Rehab Dept [facility]) [phone]  01/10/2024    [facility] 01/10/2024, 12:56 PM

## 2024-01-10 NOTE — Progress Notes (Signed)
 PT tx   Note  01/10/24 1500  PT Visit Information  Last PT Received On 01/10/24  Assistance Needed Pt making limited progress d/t  incr pain; pt amb short distance, antalgic gait throughout distance; pt again shaking with c/o 10/10 pain after return to bed, tol ankle pumps and quad sets bil LEs, low threshold for L knee flexion; pt states the only pain relief she has had was from single does IV Dilaudid . RN present EOS. RN to contact PA/MD regarding pain level and possible need for pain medication adjustment.  Continue PT POC   History of Present Illness 73 yo female presents to therapy s/p L TKA, anterior approach on 01/08/24.  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  Precautions  Precautions Fall;Knee  Recall of Precautions/Restrictions Intact  Restrictions  Weight Bearing Restrictions Per Provider Order No  LLE Weight Bearing Per Provider Order WBAT  Pain Assessment  Pain Assessment 0-10  Pain Score 10  Pain Location L knee  Pain Descriptors / Indicators Sore;Grimacing;Guarding;Sharp;Constant (shaking)  Pain Intervention(s) Limited activity within patient's tolerance;Monitored during session;Premedicated before session;Repositioned;Utilized relaxation techniques;Ice applied  Cognition  Arousal Alert  Behavior During Therapy WFL for tasks assessed/performed  PT - Cognitive impairments No apparent impairments  Following Commands  Following commands Intact  Cueing  Cueing Techniques Verbal cues  Communication  Communication No apparent difficulties  Bed Mobility  Overal bed mobility Needs Assistance  Bed Mobility Sit to Supine  Sit to supine Min assist  General bed mobility comments assist with LLE needed d/t pain; incr time  Transfers  Overall transfer level Needs assistance  Equipment used Rolling  walker (2 wheels)  Transfers Sit to/from Stand  Sit to Stand Contact guard assist;Min assist  General transfer comment cues for hand placement and LLE position; assist to rise and transition to RW; incr time needed d/t pain. STS from bed and toilet  Ambulation/Gait  Ambulation/Gait assistance Min assist  Gait Distance (Feet) 15 Feet (x2)  Assistive device Rolling walker (2 wheels)  Gait Pattern/deviations Step-to pattern;Antalgic;Trunk flexed;Decreased stance time - left;Decreased weight shift to left  General Gait Details ongoing education for sequence, use of UEs to off load LLE for improved pain control. distance ltd by pain  General stair comments  (deferred 2nd attempt d/t pain)  Total Joint Exercises  Ankle Circles/Pumps AROM;Both;10 reps  Quad Sets AROM;Both;5 reps  Heel Slides Limitations  (limited d/t pain and muscle guarding)  PT - End of Session  Equipment Utilized During Treatment Gait belt  Activity Tolerance Patient limited by pain  Patient left with call bell/phone within reach;in bed;with bed alarm set  Nurse Communication Mobility status   PT - Assessment/Plan  PT Visit Diagnosis Other abnormalities of gait and mobility (R26.89)  PT Frequency (ACUTE ONLY) 7X/week  Follow Up Recommendations Follow physician's recommendations for discharge plan and follow up therapies  Patient can return home with the following A little help with walking and/or transfers;A little help with bathing/dressing/bathroom;Help with stairs or ramp for entrance;Assistance with cooking/housework;Assist for transportation  PT equipment None recommended by PT  AM-PAC PT 6 Clicks Mobility Outcome Measure (Version 2)  Help needed turning from your back to your side while in a flat bed without using bedrails? 3  Help needed moving from lying on your back to sitting on the  side of a flat bed without using bedrails? 3  Help needed moving to and from a bed to a chair (including a wheelchair)? 3  Help  needed standing up from a chair using your arms (e.g., wheelchair or bedside chair)? 3  Help needed to walk in hospital room? 3  Help needed climbing 3-5 steps with a railing?  2  6 Click Score 17  Consider Recommendation of Discharge To: Home with [facility]  PT Goal Progression  Progress towards PT goals Progressing toward goals  Acute Rehab PT Goals  PT Goal Formulation With patient  Time For Goal Achievement 01/16/24  Potential to Achieve Goals Good  PT Time Calculation  PT Start Time (ACUTE ONLY) 1453  PT Stop Time (ACUTE ONLY) 1516  PT Time Calculation (min) (ACUTE ONLY) 23 min  PT General Charges  $$ ACUTE PT VISIT 1 Visit  PT Treatments  $Gait Training 8-22 mins  $Therapeutic Activity 8-22 mins  Rexene, PT  Acute Rehab Dept ([facility]) [phone]  01/10/2024

## 2024-01-10 NOTE — Progress Notes (Signed)
   Subjective: 2 Days Post-Op Procedure(s) (LRB): ARTHROPLASTY, KNEE, TOTAL (Left) Patient reports pain as mild.   Patient seen in rounds by Dr. Melodi. Patient had issues with pain control yesterday, was not ready for discharge. Feeling better this AM.  Objective: Vital signs in last 24 hours: Temp:  [98.2 F (36.8 C)-98.7 F (37.1 C)] 98.7 F (37.1 C) (11/12 0524) Pulse Rate:  [78-96] 85 (11/12 0524) Resp:  [16-17] 16 (11/12 0524) BP: (118-162)/(47-81) 161/73 (11/12 0524) SpO2:  [94 %-96 %] 96 % (11/12 0524)  Intake/Output from previous day:  Intake/Output Summary (Last 24 hours) at 01/10/2024 0822 Last data filed at 01/10/2024 0600 Gross per 24 hour  Intake 923.16 ml  Output 250 ml  Net 673.16 ml    Intake/Output this shift: No intake/output data recorded.  Labs: Recent Labs    01/09/24 0400 01/10/24 0349  HGB 11.8* 11.2*   Recent Labs    01/09/24 0400 01/10/24 0349  WBC 8.9 9.0  RBC 4.00 3.78*  HCT 36.8 33.6*  PLT 225 208   Recent Labs    01/09/24 0400  NA 143  K 4.4  CL 110  CO2 24  BUN 19  CREATININE 0.88  GLUCOSE 145*  CALCIUM  9.0   No results for input(s): LABPT, INR in the last 72 hours.  Exam: General - Patient is Alert and Oriented Extremity - Neurologically intact Neurovascular intact Sensation intact distally Dorsiflexion/Plantar flexion intact Dressing/Incision - clean, dry, no drainage Motor Function - intact, moving foot and toes well on exam.   Past Medical History:  Diagnosis Date   Allergy 2000   Latex rash   Anxiety 2015   since retirement   Arthritis 2011   Osteoarthritis   Bursitis of left hip    C. difficile diarrhea    Cancer (HCC) 05/11/2017   basil cell on right leg   Cataract 12/05/2018   bilateral   Complication of anesthesia    Depression 2018   Chronic pain   Diverticulitis    GERD (gastroesophageal reflux disease) 10   Hx: UTI (urinary tract infection)    Hyperlipidemia ?   Hypertension     Hyperthyroidism    no meds per patient   Neuromuscular disorder (HCC) 2019   weakness in legs, legs & feet numb & tingle at night in bed   Osteopenia    Osteoporosis 2011   PONV (postoperative nausea and vomiting)    Rosacea    Rotator cuff arthropathy, left    Sleep apnea 06/03/2020   Chronic Obstructive Sleep Apnea - Dr. Shellia    Assessment/Plan: 2 Days Post-Op Procedure(s) (LRB): ARTHROPLASTY, KNEE, TOTAL (Left) Principal Problem:   OA (osteoarthritis) of knee Active Problems:   Primary osteoarthritis of left knee  Estimated body mass index is 27.47 kg/m as calculated from the following:   Height as of this encounter: 5' 4 (1.626 m).   Weight as of this encounter: 72.6 kg. Up with therapy  DVT Prophylaxis - Aspirin  Weight-bearing as tolerated  Discharge to home with OPPT  Roxie Mess, PA-C Orthopedic Surgery [phone] 01/10/2024, 8:22 AM

## 2024-01-11 ENCOUNTER — Other Ambulatory Visit (HOSPITAL_COMMUNITY): Payer: Self-pay

## 2024-01-11 DIAGNOSIS — M1712 Unilateral primary osteoarthritis, left knee: Secondary | ICD-10-CM | POA: Diagnosis not present

## 2024-01-11 NOTE — Progress Notes (Signed)
 Patient's knee has become very red and more swollen than this morning. The knee is hot to the touch. Also she has worked with pt twice today and she can only toe touch and stand and pivot to the commode. Notified ortho PA.

## 2024-01-11 NOTE — Progress Notes (Signed)
 Physical Therapy Treatment Patient Details Name: Tracy [name] MRN: [id number] DOB: 02/23/1951 Today's Date: 01/11/2024   History of Present Illness 73 yo female presents to therapy s/p L TKA, anterior approach on 01/08/24.  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    PT Comments  Pt in recliner, agreeable to session. Pt reports that she needs to use the bathroom before attempting further amb and stair training. Pt has progressive pain with distance 23ft using RW and requires CGA initially to min A for retro stepping to position for stand to sit on toilet. Pt reports significant pain. L knee is red, warm, and edematous. Recliner provided for stand pivot transfer and able to complete well, resting in recliner, nursing and PA notified of knee status. Will continue to work towards activity tolerance with pt symptom response considered.    If plan is discharge home, recommend the following: A little help with walking and/or transfers;A little help with bathing/dressing/bathroom;Help with stairs or ramp for entrance;Assistance with cooking/housework;Assist for transportation   Can travel by private vehicle        Equipment Recommendations  None recommended by PT    Recommendations for Other Services       Precautions / Restrictions Precautions Precautions: Fall;Knee Recall of Precautions/Restrictions: Intact Restrictions Weight Bearing Restrictions Per Provider Order: Yes LLE Weight Bearing Per Provider Order: Weight bearing as tolerated     Mobility  Bed Mobility Overal bed mobility: Needs Assistance Bed Mobility: Supine to Sit     Supine to sit: Supervision     General bed mobility comments: Pt uses gait belt to assist LLE    Transfers Overall transfer level: Needs assistance Equipment used:  Rolling walker (2 wheels) Transfers: Sit to/from Stand Sit to Stand: Contact guard assist           General transfer comment: pt initially transfers at [facility] this session    Ambulation/Gait Ambulation/Gait assistance: Min assist Gait Distance (Feet): 20 Feet Assistive device: Rolling walker (2 wheels) Gait Pattern/deviations: Step-to pattern, Antalgic, Trunk flexed, Decreased stance time - left, Decreased weight shift to left Gait velocity: dec     General Gait Details: Pt amb x44ft to the bathroom and noted progressive difficulty. She requires CGA initially and was mod A for stand to sit transfer on the toilet. Knee is red and warm, painful with this session, nursing notified and PA-C notified   Stairs             Wheelchair Mobility     Tilt Bed    Modified Rankin (Stroke Patients Only)       Balance Overall balance assessment: Needs assistance Sitting-balance support: No upper extremity supported, Feet supported Sitting balance-Leahy Scale: Good     Standing balance support: Single extremity supported, During functional activity Standing balance-Leahy Scale: Fair Standing balance comment: able to remove one hand at a time and maintains balance                            Communication Communication Communication: No apparent difficulties  Cognition Arousal: Alert Behavior During Therapy: WFL for tasks assessed/performed   PT - Cognitive impairments: No apparent impairments  Following commands: Intact      Cueing Cueing Techniques: Verbal cues, Gestural cues  Exercises Total Joint Exercises Ankle Circles/Pumps: AROM, Both, Other reps (comment) (30) Quad Sets: AROM, Both, Other reps (comment) (30) Gluteal Sets: AROM, Both, Other reps (comment) (30)    General Comments General comments (skin integrity, edema, etc.): Cardinal signs of inflammation present      Pertinent Vitals/Pain Pain Assessment Pain  Assessment: Faces Pain Score: 5  Faces Pain Scale: Hurts whole lot Pain Location: L knee Pain Descriptors / Indicators: Sore, Grimacing, Guarding, Constant, Operative site guarding Pain Intervention(s): Limited activity within patient's tolerance, Monitored during session    Home Living                          Prior Function            PT Goals (current goals can now be found in the care plan section) Acute Rehab PT Goals PT Goal Formulation: With patient Time For Goal Achievement: 01/16/24 Potential to Achieve Goals: Good Progress towards PT goals: Progressing toward goals    Frequency    7X/week      PT Plan      Co-evaluation              AM-PAC PT 6 Clicks Mobility   Outcome Measure  Help needed turning from your back to your side while in a flat bed without using bedrails?: A Little Help needed moving from lying on your back to sitting on the side of a flat bed without using bedrails?: A Little Help needed moving to and from a bed to a chair (including a wheelchair)?: A Little Help needed standing up from a chair using your arms (e.g., wheelchair or bedside chair)?: A Little Help needed to walk in hospital room?: A Little Help needed climbing 3-5 steps with a railing? : A Lot 6 Click Score: 17    End of Session Equipment Utilized During Treatment: Gait belt Activity Tolerance: Patient limited by pain Patient left: with call bell/phone within reach;in chair;with chair alarm set Nurse Communication: Mobility status PT Visit Diagnosis: Other abnormalities of gait and mobility (R26.89);Pain;Unsteadiness on feet (R26.81) Pain - Right/Left: Left Pain - part of body: Knee     Time: 8642-8585 PT Time Calculation (min) (ACUTE ONLY): 17 min  Charges:    $Gait Training: 8-22 mins PT General Charges $$ ACUTE PT VISIT: 1 Visit                     Stann, PT Acute Rehabilitation Services Office: [phone] 01/11/2024    [name] 01/11/2024, 2:28 PM

## 2024-01-11 NOTE — Progress Notes (Signed)
   Subjective: 3 Days Post-Op Procedure(s) (LRB): ARTHROPLASTY, KNEE, TOTAL (Left) Patient reports pain as mild.   Patient seen in rounds by Dr. Melodi. Patient was unable to discharge yesterday due to pain control. Switched to PO dilaudid  with improvement.   Objective: Vital signs in last 24 hours: Temp:  [98.2 F (36.8 C)-98.4 F (36.9 C)] 98.2 F (36.8 C) (11/13 0542) Pulse Rate:  [88-98] 88 (11/13 0542) Resp:  [15-16] 16 (11/13 0542) BP: (146-151)/(64-74) 148/69 (11/13 0542) SpO2:  [95 %-98 %] 95 % (11/13 0542)  Intake/Output from previous day:  Intake/Output Summary (Last 24 hours) at 01/11/2024 0809 Last data filed at 01/10/2024 1826 Gross per 24 hour  Intake 600 ml  Output --  Net 600 ml    Intake/Output this shift: No intake/output data recorded.  Labs: Recent Labs    01/09/24 0400 01/10/24 0349  HGB 11.8* 11.2*   Recent Labs    01/09/24 0400 01/10/24 0349  WBC 8.9 9.0  RBC 4.00 3.78*  HCT 36.8 33.6*  PLT 225 208   Recent Labs    01/09/24 0400  NA 143  K 4.4  CL 110  CO2 24  BUN 19  CREATININE 0.88  GLUCOSE 145*  CALCIUM  9.0   No results for input(s): LABPT, INR in the last 72 hours.  Exam: General - Patient is Alert and Oriented Extremity - Neurologically intact Neurovascular intact Sensation intact distally Dorsiflexion/Plantar flexion intact Dressing/Incision - clean, dry, no drainage Motor Function - intact, moving foot and toes well on exam.   Past Medical History:  Diagnosis Date   Allergy 2000   Latex rash   Anxiety 2015   since retirement   Arthritis 2011   Osteoarthritis   Bursitis of left hip    C. difficile diarrhea    Cancer (HCC) 05/11/2017   basil cell on right leg   Cataract 12/05/2018   bilateral   Complication of anesthesia    Depression 2018   Chronic pain   Diverticulitis    GERD (gastroesophageal reflux disease) 10   Hx: UTI (urinary tract infection)    Hyperlipidemia ?   Hypertension     Hyperthyroidism    no meds per patient   Neuromuscular disorder (HCC) 2019   weakness in legs, legs & feet numb & tingle at night in bed   Osteopenia    Osteoporosis 2011   PONV (postoperative nausea and vomiting)    Rosacea    Rotator cuff arthropathy, left    Sleep apnea 06/03/2020   Chronic Obstructive Sleep Apnea - Dr. Shellia    Assessment/Plan: 3 Days Post-Op Procedure(s) (LRB): ARTHROPLASTY, KNEE, TOTAL (Left) Principal Problem:   OA (osteoarthritis) of knee Active Problems:   Primary osteoarthritis of left knee  Estimated body mass index is 27.47 kg/m as calculated from the following:   Height as of this encounter: 5' 4 (1.626 m).   Weight as of this encounter: 72.6 kg. Up with therapy  DVT Prophylaxis - Aspirin  Weight-bearing as tolerated  Discharge to home once cleared with PT  Roxie Mess, PA-C Orthopedic Surgery [phone] 01/11/2024, 8:09 AM

## 2024-01-11 NOTE — Progress Notes (Signed)
 Physical Therapy Treatment Patient Details Name: Tracy [name] MRN: [id number] DOB: 1950/04/11 Today's Date: 01/11/2024   History of Present Illness 73 yo female presents to therapy s/p L TKA, anterior approach on 01/08/24.  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    PT Comments  Pt in bed, nursing present, and pt agreeable to session. Husband at  bedside and supportive. Pt able to transfer from supine to sitting using gait belt and minor verbal cues. Maintains sitting balance, able to complete Sit to Stand at mod I with RW. Amb x62ft with RW using step to pattern, requires standing rest break x2. Pt exhibits inc pain, dec, activity tolerance, and shortness of breath with this level of activity, unable to progress to 12 steps per home setup. Pt reports symptoms return to baseline in sitting with legs elevated. Ice applied, will follow up.    If plan is discharge home, recommend the following: A little help with walking and/or transfers;A little help with bathing/dressing/bathroom;Help with stairs or ramp for entrance;Assistance with cooking/housework;Assist for transportation   Can travel by private vehicle        Equipment Recommendations  None recommended by PT    Recommendations for Other Services       Precautions / Restrictions Precautions Precautions: Fall;Knee Recall of Precautions/Restrictions: Intact Restrictions Weight Bearing Restrictions Per Provider Order: Yes LLE Weight Bearing Per Provider Order: Weight bearing as tolerated     Mobility  Bed Mobility Overal bed mobility: Needs Assistance Bed Mobility: Supine to Sit     Supine to sit: Supervision     General bed mobility comments: Pt uses gait belt to assist LLE    Transfers Overall transfer level: Needs assistance Equipment  used: Rolling walker (2 wheels) Transfers: Sit to/from Stand Sit to Stand: Modified independent (Device/Increase time)                Ambulation/Gait Ambulation/Gait assistance: Min assist Gait Distance (Feet): 85 Feet Assistive device: Rolling walker (2 wheels) Gait Pattern/deviations: Step-to pattern, Antalgic, Trunk flexed, Decreased stance time - left, Decreased weight shift to left Gait velocity: dec     General Gait Details: pt able to complete 51ft with CGA assist and RW, requires min A for last 26ft due to inc fatigue and inc pain, stride length improves with progression of task, unable to safely trial stairs this session.   Stairs             Wheelchair Mobility     Tilt Bed    Modified Rankin (Stroke Patients Only)       Balance Overall balance assessment: Needs assistance Sitting-balance support: No upper extremity supported, Feet supported Sitting balance-Leahy Scale: Good     Standing balance support: Single extremity supported, During functional activity Standing balance-Leahy Scale: Fair Standing balance comment: able to remove one hand at a time and maintains balance                            Communication Communication Communication: No apparent difficulties  Cognition Arousal: Alert Behavior During Therapy: WFL for tasks assessed/performed   PT - Cognitive impairments: No apparent impairments  Following commands: Intact      Cueing Cueing Techniques: Verbal cues, Gestural cues  Exercises Total Joint Exercises Ankle Circles/Pumps: AROM, Both, Other reps (comment) (30) Quad Sets: AROM, Both, Other reps (comment) (30) Gluteal Sets: AROM, Both, Other reps (comment) (30)    General Comments        Pertinent Vitals/Pain Pain Assessment Pain Assessment: 0-10 Pain Score: 5  Pain Location: L knee Pain Descriptors / Indicators: Sore, Grimacing, Guarding, Constant, Operative site  guarding Pain Intervention(s): Limited activity within patient's tolerance, Monitored during session, Ice applied    Home Living                          Prior Function            PT Goals (current goals can now be found in the care plan section) Acute Rehab PT Goals PT Goal Formulation: With patient Time For Goal Achievement: 01/16/24 Potential to Achieve Goals: Good Progress towards PT goals: Progressing toward goals    Frequency    7X/week      PT Plan      Co-evaluation              AM-PAC PT 6 Clicks Mobility   Outcome Measure  Help needed turning from your back to your side while in a flat bed without using bedrails?: A Little Help needed moving from lying on your back to sitting on the side of a flat bed without using bedrails?: A Little Help needed moving to and from a bed to a chair (including a wheelchair)?: A Little Help needed standing up from a chair using your arms (e.g., wheelchair or bedside chair)?: None Help needed to walk in hospital room?: A Little Help needed climbing 3-5 steps with a railing? : A Lot 6 Click Score: 18    End of Session Equipment Utilized During Treatment: Gait belt Activity Tolerance: Patient limited by pain;Patient tolerated treatment well Patient left: with call bell/phone within reach;in chair;with chair alarm set;with family/visitor present Nurse Communication: Mobility status PT Visit Diagnosis: Other abnormalities of gait and mobility (R26.89)     Time: 9081-9063 PT Time Calculation (min) (ACUTE ONLY): 18 min  Charges:    $Gait Training: 8-22 mins PT General Charges $$ ACUTE PT VISIT: 1 Visit                     Tracy [name], PT Acute Rehabilitation Services Office: [phone] 01/11/2024    Tracy [name] Tracy [name] 01/11/2024, 9:45 AM

## 2024-01-12 ENCOUNTER — Other Ambulatory Visit (HOSPITAL_COMMUNITY): Payer: Self-pay

## 2024-01-12 DIAGNOSIS — M1712 Unilateral primary osteoarthritis, left knee: Secondary | ICD-10-CM | POA: Diagnosis not present

## 2024-01-12 NOTE — Progress Notes (Signed)
 Discharge meds in a secure bag delivered to patient in room by this RN

## 2024-01-12 NOTE — Plan of Care (Signed)
   Problem: Coping: Goal: Level of anxiety will decrease Outcome: Progressing   Problem: Pain Managment: Goal: General experience of comfort will improve and/or be controlled Outcome: Progressing   Problem: Safety: Goal: Ability to remain free from injury will improve Outcome: Progressing

## 2024-01-12 NOTE — Progress Notes (Signed)
Discharge package printed and instructions given to pt. Pt verbalizes understanding. 

## 2024-01-12 NOTE — Progress Notes (Signed)
 Physical Therapy Treatment Patient Details Name: Tracy [name] MRN: [id number] DOB: [birth year] Today's Date: 01/12/2024   History of Present Illness 73 yo female presents to therapy s/p L TKA, anterior approach on 01/08/24.  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    PT Comments  Pt seen for stair training today in preparation for discharge; spouse present throughout session to ensure carryover of education and safety with task. Completes x2 bouts of 3 steps with HR, crutch, and continued multimodal cues for sequencing and safety. Spouse education on safe guarding and assistance to pt with stairs. Reviewed car transfer. Handout provided for stair negotiation and total knee HEP.     If plan is discharge home, recommend the following: A little help with walking and/or transfers;A little help with bathing/dressing/bathroom;Help with stairs or ramp for entrance;Assistance with cooking/housework;Assist for transportation   Can travel by private vehicle        Equipment Recommendations  None recommended by PT    Recommendations for Other Services       Precautions / Restrictions Precautions Precautions: Fall;Knee Recall of Precautions/Restrictions: Intact Restrictions Weight Bearing Restrictions Per Provider Order: No LLE Weight Bearing Per Provider Order: Weight bearing as tolerated     Mobility  Bed Mobility               General bed mobility comments: Pt seated in recliner upon PT arrival    Transfers Overall transfer level: Needs assistance Equipment used: Rolling walker (2 wheels) Transfers: Sit to/from Stand Sit to Stand: Contact guard assist           General transfer comment: CGA with VC for hand placement    Ambulation/Gait Ambulation/Gait assistance: Min assist Gait  Distance (Feet): 40 Feet Assistive device: Rolling walker (2 wheels) Gait Pattern/deviations: Step-to pattern, Antalgic, Trunk flexed, Decreased stance time - left, Decreased weight shift to left Gait velocity: dec     General Gait Details: x4 bouts of 26ft to/from stairs for practice, continued multimodal cues for step sequencing, posture, proximity to DME and controlled pace for safety   Stairs Stairs: Yes Stairs assistance: Min assist, +2 safety/equipment Stair Management: One rail Right, With crutches (crutch in LUE) Number of Stairs: 6 General stair comments: x2 bouts of 3 steps with RUE support on HR and LUE support on crutch due to limited UE strength with wider handrails. Requires continued multimodal cues for step sequencing, increased L foot dorsiflexion during ascent, wider BOS, adequate step length and posture. PT educated spouse on performance and guarding techniques during first set, and had spouse assist with hands on during second attempt. Handout provided for stair sequencing using handrail and crutch   Wheelchair Mobility     Tilt Bed    Modified Rankin (Stroke Patients Only)       Balance Overall balance assessment: Needs assistance Sitting-balance support: No upper extremity supported, Feet supported Sitting balance-Leahy Scale: Good     Standing balance support: Bilateral upper extremity supported Standing balance-Leahy Scale: Fair                              Hotel Manager: No apparent difficulties  Cognition Arousal: Alert Behavior During Therapy: WFL for tasks assessed/performed   PT - Cognitive impairments: No apparent impairments  Following commands: Intact      Cueing Cueing Techniques: Verbal cues, Gestural cues, Visual cues  Exercises      General Comments        Pertinent Vitals/Pain Pain Assessment Pain Assessment: 0-10 Pain Score: 6  Pain Location: L  knee Pain Descriptors / Indicators: Sore, Grimacing, Guarding, Constant, Operative site guarding Pain Intervention(s): Monitored during session, Limited activity within patient's tolerance    Home Living                          Prior Function            PT Goals (current goals can now be found in the care plan section) Acute Rehab PT Goals PT Goal Formulation: With patient Potential to Achieve Goals: Good Progress towards PT goals: Progressing toward goals    Frequency    7X/week      PT Plan      Co-evaluation              AM-PAC PT 6 Clicks Mobility   Outcome Measure  Help needed turning from your back to your side while in a flat bed without using bedrails?: A Little Help needed moving from lying on your back to sitting on the side of a flat bed without using bedrails?: A Little Help needed moving to and from a bed to a chair (including a wheelchair)?: A Little Help needed standing up from a chair using your arms (e.g., wheelchair or bedside chair)?: A Little Help needed to walk in hospital room?: A Little Help needed climbing 3-5 steps with a railing? : A Lot 6 Click Score: 17    End of Session Equipment Utilized During Treatment: Gait belt Activity Tolerance: Patient limited by pain Patient left: with call bell/phone within reach;in chair;with chair alarm set Nurse Communication: Mobility status PT Visit Diagnosis: Other abnormalities of gait and mobility (R26.89);Pain;Unsteadiness on feet (R26.81) Pain - Right/Left: Left Pain - part of body: Knee     Time: 8983-8943 PT Time Calculation (min) (ACUTE ONLY): 40 min  Charges:    $Gait Training: 23-37 mins $Therapeutic Activity: 8-22 mins                       Isaiah DEL. Renessa Wellnitz, PT, DPT  [employer] 01/12/2024, 1:45 PM

## 2024-01-12 NOTE — Plan of Care (Signed)
   Problem: Activity: Goal: Risk for activity intolerance will decrease Outcome: Progressing   Problem: Pain Managment: Goal: General experience of comfort will improve and/or be controlled Outcome: Progressing   Problem: Safety: Goal: Ability to remain free from injury will improve Outcome: Progressing

## 2024-01-12 NOTE — Progress Notes (Signed)
   Subjective: 4 Days Post-Op Procedure(s) (LRB): ARTHROPLASTY, KNEE, TOTAL (Left) Patient reports pain as mild.   Patient seen in rounds for Dr. Melodi. Patient is doing well this morning, reports minimal pain. Did not pass stair training yesterday.  Objective: Vital signs in last 24 hours: Temp:  [98.6 F (37 C)-99.5 F (37.5 C)] 98.6 F (37 C) (11/14 0458) Pulse Rate:  [83-92] 83 (11/14 0458) Resp:  [16-20] 18 (11/14 0458) BP: (123-136)/(65-96) 123/96 (11/14 0458) SpO2:  [97 %-98 %] 98 % (11/14 0458)  Intake/Output from previous day:  Intake/Output Summary (Last 24 hours) at 01/12/2024 0816 Last data filed at 01/12/2024 0200 Gross per 24 hour  Intake 240 ml  Output 0 ml  Net 240 ml    Intake/Output this shift: No intake/output data recorded.  Labs: Recent Labs    01/10/24 0349  HGB 11.2*   Recent Labs    01/10/24 0349  WBC 9.0  RBC 3.78*  HCT 33.6*  PLT 208   No results for input(s): NA, K, CL, CO2, BUN, CREATININE, GLUCOSE, CALCIUM  in the last 72 hours. No results for input(s): LABPT, INR in the last 72 hours.  Exam: General - Patient is Alert and Oriented Extremity - Neurologically intact Neurovascular intact Sensation intact distally Dorsiflexion/Plantar flexion intact Dressing/Incision - clean, dry, no drainage Motor Function - intact, moving foot and toes well on exam.   Past Medical History:  Diagnosis Date   Allergy 2000   Latex rash   Anxiety 2015   since retirement   Arthritis 2011   Osteoarthritis   Bursitis of left hip    C. difficile diarrhea    Cancer (HCC) 05/11/2017   basil cell on right leg   Cataract 12/05/2018   bilateral   Complication of anesthesia    Depression 2018   Chronic pain   Diverticulitis    GERD (gastroesophageal reflux disease) 10   Hx: UTI (urinary tract infection)    Hyperlipidemia ?   Hypertension    Hyperthyroidism    no meds per patient   Neuromuscular disorder (HCC) 2019    weakness in legs, legs & feet numb & tingle at night in bed   Osteopenia    Osteoporosis 2011   PONV (postoperative nausea and vomiting)    Rosacea    Rotator cuff arthropathy, left    Sleep apnea 06/03/2020   Chronic Obstructive Sleep Apnea - Dr. Shellia    Assessment/Plan: 4 Days Post-Op Procedure(s) (LRB): ARTHROPLASTY, KNEE, TOTAL (Left) Principal Problem:   OA (osteoarthritis) of knee Active Problems:   Primary osteoarthritis of left knee  Estimated body mass index is 27.47 kg/m as calculated from the following:   Height as of this encounter: 5' 4 (1.626 m).   Weight as of this encounter: 72.6 kg. Up with therapy  DVT Prophylaxis - Aspirin  Weight-bearing as tolerated  Discharge to home once cleared with PT. Notified by RN yesterday that patients knee was red and swollen. Picture of knee was sent via SecureChat yesterday and knee examined this morning. Knee is within normal limits for 3 & 4 days postop.  Roxie Mess, PA-C Orthopedic Surgery [phone] 01/12/2024, 8:16 AM

## 2024-01-12 NOTE — Progress Notes (Signed)
 Orthopedic Tech Progress Note Patient Details:  Tracy [name] 1950-07-19 [id number]  Ortho Devices Type of Ortho Device: Crutches Ortho Device/Splint Location: LLE Ortho Device/Splint Interventions: Ordered, Adjustment   Post Interventions Patient Tolerated: Unable to use device properly Instructions Provided: Adjustment of device, Care of device, Poper ambulation with device  Gabreal Worton E Taysen Bushart 01/12/2024, 11:28 AM

## 2024-01-16 NOTE — Discharge Summary (Signed)
 Patient ID: Tracy [name] MRN: [id number] DOB/AGE: 05/30/1950 73 y.o.  Admit date: 01/08/2024 Discharge date: 01/12/2024  Admission Diagnoses:  Principal Problem:   OA (osteoarthritis) of knee Active Problems:   Primary osteoarthritis of left knee   Discharge Diagnoses:  Same  Past Medical History:  Diagnosis Date   Allergy 2000   Latex rash   Anxiety 2015   since retirement   Arthritis 2011   Osteoarthritis   Bursitis of left hip    C. difficile diarrhea    Cancer (HCC) 05/11/2017   basil cell on right leg   Cataract 12/05/2018   bilateral   Complication of anesthesia    Depression 2018   Chronic pain   Diverticulitis    GERD (gastroesophageal reflux disease) 10   Hx: UTI (urinary tract infection)    Hyperlipidemia ?   Hypertension    Hyperthyroidism    no meds per patient   Neuromuscular disorder (HCC) 2019   weakness in legs, legs & feet numb & tingle at night in bed   Osteopenia    Osteoporosis 2011   PONV (postoperative nausea and vomiting)    Rosacea    Rotator cuff arthropathy, left    Sleep apnea 06/03/2020   Chronic Obstructive Sleep Apnea - Dr. Shellia    Surgeries: Procedure(s): ARTHROPLASTY, KNEE, TOTAL on 01/08/2024   Consultants:   Discharged Condition: Improved  Hospital Course: [name] is an 73 y.o. female who was admitted 01/08/2024 for operative treatment ofOA (osteoarthritis) of knee. Patient has severe unremitting pain that affects sleep, daily activities, and work/hobbies. After pre-op clearance the patient was taken to the operating room on 01/08/2024 and underwent  Procedure(s): ARTHROPLASTY, KNEE, TOTAL.    Patient was given perioperative antibiotics:  Anti-infectives (From admission, onward)    Start     Dose/Rate Route Frequency Ordered Stop   01/08/24 2100  ceFAZolin  (ANCEF ) IVPB 2g/100 mL premix        2 g 200 mL/hr over 30 Minutes Intravenous Every 6 hours 01/08/24 1819 01/09/24 1144   01/08/24 1230  ceFAZolin   (ANCEF ) IVPB 2g/100 mL premix        2 g 200 mL/hr over 30 Minutes Intravenous On call to O.R. 01/08/24 1224 01/08/24 1446        Patient was given sequential compression devices, early ambulation, and chemoprophylaxis to prevent DVT.  Patient benefited maximally from hospital stay and there were no complications.    Recent vital signs: No data found.   Recent laboratory studies: No results for input(s): WBC, HGB, HCT, PLT, NA, K, CL, CO2, BUN, CREATININE, GLUCOSE, INR, CALCIUM  in the last 72 hours.  Invalid input(s): PT, 2   Discharge Medications:   Allergies as of 01/12/2024       Reactions   Amoxicillin -pot Clavulanate Diarrhea   Severe diarrhea   Nylon Hives   Demerol  [meperidine ] Nausea And Vomiting   Latex Rash, Other (See Comments), Hives   Reaction:  Blisters    Other Itching   Nylon sheets, pt prefers cotton sheets    Tramadol  Nausea And Vomiting        Medication List     STOP taking these medications    DULoxetine  20 MG capsule Commonly known as: CYMBALTA        TAKE these medications    Aspirin  Low Dose 81 MG chewable tablet Generic drug: aspirin  Chew 1 tablet (81 mg total) by mouth 2 (two) times daily for 21 days. Then take one 81 mg aspirin   once a day for three weeks. Then discontinue aspirin .   CALCIUM  600+D PO Take 1 tablet by mouth 2 (two) times daily.   fenofibrate  160 MG tablet Take 1 tablet (160 mg total) by mouth daily.   HYDROmorphone  2 MG tablet Commonly known as: DILAUDID  Take 1-2 tablets (2-4 mg total) by mouth every 6 (six) hours as needed for severe pain (pain score 7-10) or moderate pain (pain score 4-6).   melatonin 5 MG Tabs Take 7.5 mg by mouth at bedtime.   methocarbamol  500 MG tablet Commonly known as: ROBAXIN  Take 1 tablet (500 mg total) by mouth every 6 (six) hours as needed for muscle spasms.   multivitamin with minerals Tabs tablet Take 1 tablet by mouth daily.   ondansetron  4 MG  tablet Commonly known as: ZOFRAN  Take 1 tablet (4 mg total) by mouth every 6 (six) hours as needed for nausea.   pantoprazole  40 MG tablet Commonly known as: PROTONIX  Take 1 tablet (40 mg total) by mouth 2 (two) times daily.   polyethylene glycol 17 g packet Commonly known as: MIRALAX  / GLYCOLAX  Take 17 g by mouth daily.   Prolia  60 MG/ML Sosy injection Generic drug: denosumab  Inject 60 mg into the skin every 6 (six) months.   Systane Complete PF 0.6 % Soln Generic drug: Propylene Glycol (PF) Place 1 drop into both eyes 2 (two) times daily.   Vitamin D  50 MCG (2000 UT) tablet Take 2,000 Units by mouth daily.               Discharge Care Instructions  (From admission, onward)           Start     Ordered   01/09/24 0000  Weight bearing as tolerated        01/09/24 0806   01/09/24 0000  Change dressing       Comments: You may remove the bulky bandage (ACE wrap and gauze) two days after surgery. You will have an adhesive waterproof bandage underneath. Leave this in place until your first follow-up appointment.   01/09/24 0806            Diagnostic Studies: No results found.  Disposition: Discharge disposition: 01-Home or Self Care       Discharge Instructions     Call MD / Call 911   Complete by: As directed    If you experience chest pain or shortness of breath, CALL 911 and be transported to the hospital emergency room.  If you develope a fever above 101 F, pus (white drainage) or increased drainage or redness at the wound, or calf pain, call your surgeon's office.   Change dressing   Complete by: As directed    You may remove the bulky bandage (ACE wrap and gauze) two days after surgery. You will have an adhesive waterproof bandage underneath. Leave this in place until your first follow-up appointment.   Constipation Prevention   Complete by: As directed    Drink plenty of fluids.  Prune juice may be helpful.  You may use a stool softener, such as  Colace (over the counter) 100 mg twice a day.  Use MiraLax  (over the counter) for constipation as needed.   Diet - low sodium heart healthy   Complete by: As directed    Do not put a pillow under the knee. Place it under the heel.   Complete by: As directed    Driving restrictions   Complete by: As directed    No  driving for two weeks   Post-operative opioid taper instructions:   Complete by: As directed    POST-OPERATIVE OPIOID TAPER INSTRUCTIONS: It is important to wean off of your opioid medication as soon as possible. If you do not need pain medication after your surgery it is ok to stop day one. Opioids include: Codeine, Hydrocodone (Norco, Vicodin), Oxycodone (Percocet, oxycontin ) and hydromorphone  amongst others.  Long term and even short term use of opiods can cause: Increased pain response Dependence Constipation Depression Respiratory depression And more.  Withdrawal symptoms can include Flu like symptoms Nausea, vomiting And more Techniques to manage these symptoms Hydrate well Eat regular healthy meals Stay active Use relaxation techniques(deep breathing, meditating, yoga) Do Not substitute Alcohol  to help with tapering If you have been on opioids for less than two weeks and do not have pain than it is ok to stop all together.  Plan to wean off of opioids This plan should start within one week post op of your joint replacement. Maintain the same interval or time between taking each dose and first decrease the dose.  Cut the total daily intake of opioids by one tablet each day Next start to increase the time between doses. The last dose that should be eliminated is the evening dose.      TED hose   Complete by: As directed    Use stockings (TED hose) for three weeks on both leg(s).  You may remove them at night for sleeping.   Weight bearing as tolerated   Complete by: As directed         Follow-up Information     Melodi Lerner, MD. Go on 01/30/2024.    Specialty: Orthopedic Surgery Why: You are scheduled for a post op appointment on Tuesday 01/30/24 at 3:30pm Contact information: [address] [place] [address] [phone]         [facility] Go on 01/11/2024.   Why: You are scheduled for physical therapy Thursday 01/11/24 at 3:00pm Contact information: [address] [place] [address] [phone]                  Signed: Roxie Mess 01/16/2024, 8:23 AM

## 2024-02-20 ENCOUNTER — Other Ambulatory Visit: Payer: Self-pay

## 2024-02-20 ENCOUNTER — Encounter: Payer: Self-pay | Admitting: Family Medicine

## 2024-02-20 ENCOUNTER — Telehealth: Payer: Self-pay

## 2024-02-20 ENCOUNTER — Ambulatory Visit (INDEPENDENT_AMBULATORY_CARE_PROVIDER_SITE_OTHER): Admitting: Family Medicine

## 2024-02-20 VITALS — BP 128/62 | HR 68 | Temp 98.0°F | Resp 16 | Ht 64.0 in | Wt 156.2 lb

## 2024-02-20 DIAGNOSIS — R4189 Other symptoms and signs involving cognitive functions and awareness: Secondary | ICD-10-CM

## 2024-02-20 DIAGNOSIS — F418 Other specified anxiety disorders: Secondary | ICD-10-CM

## 2024-02-20 DIAGNOSIS — R5383 Other fatigue: Secondary | ICD-10-CM | POA: Diagnosis not present

## 2024-02-20 DIAGNOSIS — R21 Rash and other nonspecific skin eruption: Secondary | ICD-10-CM

## 2024-02-20 LAB — CBC WITH DIFFERENTIAL/PLATELET
Basophils Absolute: 0 K/uL (ref 0.0–0.1)
Basophils Relative: 0.8 % (ref 0.0–3.0)
Eosinophils Absolute: 0.4 K/uL (ref 0.0–0.7)
Eosinophils Relative: 6.9 % — ABNORMAL HIGH (ref 0.0–5.0)
HCT: 36.6 % (ref 36.0–46.0)
Hemoglobin: 11.9 g/dL — ABNORMAL LOW (ref 12.0–15.0)
Lymphocytes Relative: 20.3 % (ref 12.0–46.0)
Lymphs Abs: 1.2 K/uL (ref 0.7–4.0)
MCHC: 32.6 g/dL (ref 30.0–36.0)
MCV: 88.8 fl (ref 78.0–100.0)
Monocytes Absolute: 0.5 K/uL (ref 0.1–1.0)
Monocytes Relative: 8.3 % (ref 3.0–12.0)
Neutro Abs: 3.6 K/uL (ref 1.4–7.7)
Neutrophils Relative %: 63.7 % (ref 43.0–77.0)
Platelets: 257 K/uL (ref 150.0–400.0)
RBC: 4.12 Mil/uL (ref 3.87–5.11)
RDW: 15.8 % — ABNORMAL HIGH (ref 11.5–15.5)
WBC: 5.7 K/uL (ref 4.0–10.5)

## 2024-02-20 LAB — B12 AND FOLATE PANEL
Folate: >23.7 ng/mL
Vitamin B-12: 310 pg/mL (ref 211–911)

## 2024-02-20 LAB — HEPATIC FUNCTION PANEL
ALT: 9 U/L (ref 3–35)
AST: 19 U/L (ref 5–37)
Albumin: 4.1 g/dL (ref 3.5–5.2)
Alkaline Phosphatase: 35 U/L — ABNORMAL LOW (ref 39–117)
Bilirubin, Direct: 0.1 mg/dL (ref 0.1–0.3)
Total Bilirubin: 0.3 mg/dL (ref 0.2–1.2)
Total Protein: 6.5 g/dL (ref 6.0–8.3)

## 2024-02-20 LAB — BASIC METABOLIC PANEL WITH GFR
BUN: 20 mg/dL (ref 6–23)
CO2: 30 meq/L (ref 19–32)
Calcium: 9.7 mg/dL (ref 8.4–10.5)
Chloride: 106 meq/L (ref 96–112)
Creatinine, Ser: 0.87 mg/dL (ref 0.40–1.20)
GFR: 64.96 mL/min
Glucose, Bld: 100 mg/dL — ABNORMAL HIGH (ref 70–99)
Potassium: 4.3 meq/L (ref 3.5–5.1)
Sodium: 143 meq/L (ref 135–145)

## 2024-02-20 LAB — VITAMIN D 25 HYDROXY (VIT D DEFICIENCY, FRACTURES): VITD: 40.25 ng/mL (ref 30.00–100.00)

## 2024-02-20 LAB — TSH: TSH: 0.26 u[IU]/mL — ABNORMAL LOW (ref 0.35–5.50)

## 2024-02-20 MED ORDER — SERTRALINE HCL 50 MG PO TABS: 50.0000 mg | ORAL_TABLET | Freq: Every day | ORAL | 3 refills | Status: AC

## 2024-02-20 MED ORDER — TRIAMCINOLONE ACETONIDE 0.1 % EX OINT: 1.0000 | TOPICAL_OINTMENT | Freq: Two times a day (BID) | CUTANEOUS | 1 refills | Status: AC

## 2024-02-20 NOTE — Telephone Encounter (Signed)
 I would take the Sertraline  as we discussed at our visit

## 2024-02-20 NOTE — Patient Instructions (Signed)
 Follow up in 4 weeks to recheck mood We'll notify you of your lab results and make any changes if needed START the Sertraline  once daily USE the Triamcinolone  ointment twice daily for the rash AVOID the ice/polar pack and just use ziploc bags or bags of frozen peas/corn to ice your knee Call with any questions or concerns Hang in there! Merry Christmas!!!

## 2024-02-20 NOTE — Progress Notes (Signed)
" ° °  Subjective:    Patient ID: Tracy [name], female    DOB: [birth year], 73 y.o.   MRN: [id number]  HPI Hospital f/u- pt had L total knee replacement on 11/10.  Now has a rash over L anterior knee and thigh.  Was told by Ortho to apply Cortisone 10.  Just yesterday was told to start Benadryl .  Brain fog- 'i know I'm depressed'.  Ortho told her to get her Hgb checked due to ongoing fatigue and brain fog.  Not currently on medication for depression- was previously on Sertraline .   Review of Systems For ROS see HPI     Objective:   Physical Exam Vitals reviewed.  Constitutional:      General: She is not in acute distress.    Appearance: Normal appearance. She is not ill-appearing.  HENT:     Head: Normocephalic and atraumatic.  Cardiovascular:     Rate and Rhythm: Normal rate and regular rhythm.  Pulmonary:     Effort: Pulmonary effort is normal. No respiratory distress.  Skin:    General: Skin is warm and dry.     Findings: Rash (fine maculopapular rash on anterior L thigh and knee) present.  Neurological:     General: No focal deficit present.     Mental Status: She is alert and oriented to person, place, and time.  Psychiatric:     Comments: Flat affect           Assessment & Plan:  Rash- new.  Rash is in distribution of the polar pack she was given after knee surgery.  Encouraged her to ice using a different means.  Start topical triamcinolone  ointment.  Reviewed supportive care and red flags that should prompt return. Pt expressed understanding and is in agreement w/ plan.   "

## 2024-02-20 NOTE — Assessment & Plan Note (Signed)
 Deteriorated.  Pt admits she is depressed.  Not currently on medication.  States 'i just cry a lot'.  Had long discussion w/ her and husband that crying will not improve her sxs and that if depressed, she needs to treat it like she would tx any other medical condition.  Suspect her mood is cause of her brain fog and fatigue but will check labs to r/o metabolic causes.  Will start low dose Sertraline  and monitor for improvement.  Pt expressed understanding and is in agreement w/ plan.

## 2024-02-20 NOTE — Telephone Encounter (Signed)
 Please advise, thank you.

## 2024-02-20 NOTE — Telephone Encounter (Signed)
 Copied from CRM #[id number]. Topic: Clinical - Medication Question >> Feb 20, 2024  3:15 PM Robinson H wrote: Reason for CRM: Patient was given a prescription for sertraline  (ZOLOFT ) 50 MG tablet today, states she still has some of the DULoxetine  (CYMBALTA ) 20 MG capsule and wants to know is it okay to finish those first, patient states she will just start the new medication and not to bother Dr, Mahlon

## 2024-02-21 NOTE — Telephone Encounter (Signed)
 Patient verbalized understanding and will take the 50mg  sertraline 

## 2024-02-23 ENCOUNTER — Ambulatory Visit
Admission: EM | Admit: 2024-02-23 | Discharge: 2024-02-23 | Disposition: A | Discharge status: Home / Self Care | Attending: Physician Assistant | Admitting: Physician Assistant

## 2024-02-23 ENCOUNTER — Other Ambulatory Visit: Payer: Self-pay

## 2024-02-23 ENCOUNTER — Ambulatory Visit: Payer: Self-pay | Admitting: Family Medicine

## 2024-02-23 DIAGNOSIS — R300 Dysuria: Secondary | ICD-10-CM | POA: Insufficient documentation

## 2024-02-23 DIAGNOSIS — N390 Urinary tract infection, site not specified: Secondary | ICD-10-CM | POA: Insufficient documentation

## 2024-02-23 DIAGNOSIS — R319 Hematuria, unspecified: Secondary | ICD-10-CM | POA: Insufficient documentation

## 2024-02-23 LAB — POCT URINE DIPSTICK
Blood, UA: NEGATIVE
Glucose, UA: 100 mg/dL — AB
Nitrite, UA: POSITIVE — AB
POC PROTEIN,UA: 100 — AB
Spec Grav, UA: 1.02
Urobilinogen, UA: 2 U/dL — AB
pH, UA: 5

## 2024-02-23 MED ORDER — NITROFURANTOIN MONOHYD MACRO 100 MG PO CAPS: 100.0000 mg | ORAL_CAPSULE | Freq: Two times a day (BID) | ORAL | 0 refills | Status: DC

## 2024-02-23 NOTE — ED Provider Notes (Signed)
 [facility]    CSN: [id number] Arrival date & time: 02/23/24  0820      History   Chief Complaint Chief Complaint  Patient presents with   Urinary Frequency   Dysuria    HPI Tracy [name] is a 73 y.o. female.   HPI  Pt is here today for concerns of dysuria and increased urinary frequency that started last night. She states this was persistent yesterday and into the evening and took AZO to help with the discomfort. She denies fever, chills, flank pain, abdominal pain or difficulty urinating.    Past Medical History:  Diagnosis Date   Allergy 2000   Latex rash   Anxiety 2015   since retirement   Arthritis 2011   Osteoarthritis   Bursitis of left hip    C. difficile diarrhea    Cancer (HCC) 05/11/2017   basil cell on right leg   Cataract 12/05/2018   bilateral   Complication of anesthesia    Depression 2018   Chronic pain   Diverticulitis    GERD (gastroesophageal reflux disease) 10   Hx: UTI (urinary tract infection)    Hyperlipidemia ?   Hypertension    Hyperthyroidism    no meds per patient   Neuromuscular disorder (HCC) 2019   weakness in legs, legs & feet numb & tingle at night in bed   Osteopenia    Osteoporosis 2011   PONV (postoperative nausea and vomiting)    Rosacea    Rotator cuff arthropathy, left    Sleep apnea 06/03/2020   Chronic Obstructive Sleep Apnea - Dr. 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   OSA (obstructive sleep apnea) 06/22/2020   Amputated toe 05/20/2020   Hammer toe 04/27/2020   Dyspnea on exertion  10/10/2018   Overweight (BMI 25.0-29.9) 11/28/2017   Encounter for other orthopedic aftercare 07/04/2017   S/P shoulder replacement, right 06/23/2017   Shoulder arthritis 03/01/2017   Perforation of sigmoid colon due to diverticulitis 07/05/2016   OA (osteoarthritis) of knee 08/17/2015   Thyroid  nodule, 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   Subclinical hyperthyroidism 06/05/2014   Trochanteric bursitis of left hip 04/01/2014   Rosacea 03/26/2014   Allergic rhinitis 02/19/2014   Left hip pain 12/19/2013   Atrophic vaginitis 10/07/2013   Screening for malignant neoplasm of cervix 01/12/2011   Physical exam 01/12/2011   HYPERTRIGLYCERIDEMIA 12/03/2009   GERD 12/03/2009   UTI'S, RECURRENT 12/03/2009   LEG CRAMPS, NOCTURNAL 12/03/2009   DIVERTICULITIS, HX OF 12/03/2009   Depression with anxiety 11/05/2009   Osteoporosis 11/05/2009    Past Surgical History:  Procedure Laterality Date   APPENDECTOMY  1965   BACK SURGERY     CATARACT EXTRACTION, BILATERAL  11/2018   CHOLECYSTECTOMY  2015?   Liver cyst removed also   COLONOSCOPY     EYE SURGERY  12/05/2018   Cataract removal   FOOT SURGERY Right    X 2   FRACTURE SURGERY  1955   Left Arm   JOINT REPLACEMENT  07/2015 & 05/2017   Right Knee, Rt. shoulder reverse replacement   KNEE  SURGERY     LAPAROSCOPIC LIVER CYST FENESTRATION     LUMBAR DISC SURGERY Left    OPEN SURGICAL REPAIR OF GLUTEAL TENDON Left 04/01/2014   Procedure: LEFT HIP BURSECTOMY WITH GLUTEAL TENDON REPAIR;  Surgeon: Dempsey Melodi GAILS, MD;  Location: [facility];  Service: Orthopedics;  Laterality: Left;   REVERSE SHOULDER ARTHROPLASTY Right 06/23/2017   Procedure: RIGHT REVERSE SHOULDER ARTHROPLASTY;  Surgeon: Kay Kemps, MD;  Location: [facility];  Service: Orthopedics;  Laterality: Right;   SPINE SURGERY  Dr. Colon - 12/30/2008   Lumbar microdiskectomy   TONSILLECTOMY     TOTAL HIP ARTHROPLASTY Right 08/14/2023   Procedure: ARTHROPLASTY, HIP, TOTAL, ANTERIOR APPROACH;  Surgeon:  Melodi Dempsey, MD;  Location: [facility];  Service: Orthopedics;  Laterality: Right;   TOTAL KNEE ARTHROPLASTY Right 08/17/2015   Procedure: RIGHT TOTAL KNEE ARTHROPLASTY;  Surgeon: Dempsey Melodi, MD;  Location: [facility];  Service: Orthopedics;  Laterality: Right;   TOTAL KNEE ARTHROPLASTY Left 01/08/2024   Procedure: ARTHROPLASTY, KNEE, TOTAL;  Surgeon: Melodi Dempsey, MD;  Location: [facility];  Service: Orthopedics;  Laterality: Left;   TUBAL LIGATION  1980 Dr. Levander   Tubal ligation    OB History   No obstetric history on file.      Home Medications    Prior to Admission medications  Medication Sig Start Date End Date Taking? Authorizing Provider  nitrofurantoin , macrocrystal-monohydrate, (MACROBID ) 100 MG capsule Take 1 capsule (100 mg total) by mouth 2 (two) times daily. 02/23/24  Yes Lydiah Pong E, PA-C  aspirin  81 MG chewable tablet Chew 1 tablet (81 mg total) by mouth 2 (two) times daily for 21 days. Then take one 81 mg aspirin  once a day for three weeks. Then discontinue aspirin . 01/09/24 02/23/24  Edmisten, Kristie L, PA  Calcium  Carbonate-Vitamin D  (CALCIUM  600+D PO) Take 1 tablet by mouth 2 (two) times daily.    [provider]  Cholecalciferol  (VITAMIN D ) 2000 units tablet Take 2,000 Units by mouth daily.    [provider]  denosumab  (PROLIA ) 60 MG/ML SOSY injection Inject 60 mg into the skin every 6 (six) months.    [provider]  fenofibrate  160 MG tablet Take 1 tablet (160 mg total) by mouth daily. 10/23/23   Tabori, Katherine E, MD  HYDROmorphone  (DILAUDID ) 2 MG tablet Take 1-2 tablets (2-4 mg total) by mouth every 6 (six) hours as needed for severe pain (pain score 7-10) or moderate pain (pain score 4-6). 01/10/24   Edmisten, Kristie L, PA  Melatonin 5 MG TABS Take 7.5 mg by mouth at bedtime.    [provider]  methocarbamol  (ROBAXIN ) 500 MG tablet Take 1 tablet (500 mg total) by mouth every 6 (six) hours as needed for muscle spasms. 01/09/24    Edmisten, Kristie L, PA  Multiple Vitamin (MULTIVITAMIN WITH MINERALS) TABS tablet Take 1 tablet by mouth daily.    [provider]  ondansetron  (ZOFRAN ) 4 MG tablet Take 1 tablet (4 mg total) by mouth every 6 (six) hours as needed for nausea. 01/09/24   Edmisten, Roxie CROME, PA  pantoprazole  (PROTONIX ) 40 MG tablet Take 1 tablet (40 mg total) by mouth 2 (two) times daily. 04/11/23   Tabori, Katherine E, MD  polyethylene glycol (MIRALAX  / GLYCOLAX ) packet Take 17 g by mouth daily. 08/29/16   Gladis Elsie BROCKS, PA-C  Propylene Glycol, PF, (SYSTANE COMPLETE PF) 0.6 % SOLN Place 1 drop into both eyes 2 (two) times daily.    [provider]  sertraline  (ZOLOFT ) 50 MG tablet Take 1 tablet (50 mg total) by mouth daily. 02/20/24   Tabori, Katherine E, MD  triamcinolone  ointment (KENALOG ) 0.1 % Apply 1 Application topically 2 (two) times daily. 02/20/24 02/19/25  Mahlon Comer BRAVO, MD    Family History Family History  Problem Relation Age of Onset   Hypertension Mother    Stroke Mother    Arthritis Mother    Miscarriages / Stillbirths Mother    Liver disease Father        alcohol  related   COPD Father    Alcohol  abuse Father    Depression Father    Alcohol  abuse Sister    Depression Sister    Diabetes Sister    Obesity Sister    Alcohol  abuse Sister    Heart disease Sister    Stroke Sister    Drug abuse Sister    Obesity Sister    Alcohol  abuse Sister    Depression Sister    Diabetes Sister    Obesity Sister    Drug abuse Sister    Obesity Sister    Alcohol  abuse Sister    Heart disease Sister    Stroke Sister    Bone cancer Paternal Grandfather    Cancer Paternal Grandfather    ADD / ADHD Son    ADD / ADHD Son    ADD / ADHD Son    ADD / ADHD Son    Colon cancer Neg Hx    Esophageal cancer Neg Hx    Pancreatic cancer Neg Hx    Stomach cancer Neg Hx     Social History Social History[1]   Allergies   Amoxicillin -pot clavulanate, Nylon, Demerol   [meperidine ], Latex, Other, and Tramadol    Review of Systems Review of Systems  Constitutional:  Negative for chills and fever.  Gastrointestinal:  Negative for abdominal pain.  Genitourinary:  Positive for dysuria and frequency. Negative for decreased urine volume, difficulty urinating and flank pain.     Physical Exam Triage Vital Signs ED Triage Vitals  Encounter Vitals Group     BP 02/23/24 0850 100/62     Girls Systolic BP Percentile --      Girls Diastolic BP Percentile --      Boys Systolic BP Percentile --      Boys Diastolic BP Percentile --      Pulse Rate 02/23/24 0850 70     Resp 02/23/24 0850 17     Temp 02/23/24 0850 (!) 97.5 F (36.4 C)     Temp Source 02/23/24 0850 Oral     SpO2 02/23/24 0850 94 %     Weight --      Height --      Head Circumference --      Peak Flow --      Pain Score 02/23/24 0849 0     Pain Loc --      Pain Education --      Exclude from Growth Chart --    No data found.  Updated Vital Signs BP 100/62 (BP Location: Right Arm)   Pulse 70   Temp (!) 97.5 F (36.4 C) (Oral) Comment: pt drinking water  in triage.  Resp 17   SpO2 94%   Visual Acuity Right Eye Distance:   Left Eye Distance:   Bilateral Distance:    Right Eye Near:   Left Eye Near:    Bilateral Near:     Physical Exam Vitals reviewed.  Constitutional:  General: She is awake.     Appearance: Normal appearance. She is well-developed and well-groomed.  HENT:     Head: Normocephalic and atraumatic.  Eyes:     General: Lids are normal. Gaze aligned appropriately.     Extraocular Movements: Extraocular movements intact.     Conjunctiva/sclera: Conjunctivae normal.  Pulmonary:     Effort: Pulmonary effort is normal.  Neurological:     Mental Status: She is alert and oriented to person, place, and time.  Psychiatric:        Attention and Perception: Attention and perception normal.        Mood and Affect: Mood and affect normal.        Speech: Speech  normal.        Behavior: Behavior normal. Behavior is cooperative.      UC Treatments / Results  Labs (all labs ordered are listed, but only abnormal results are displayed) Labs Reviewed  POCT URINE DIPSTICK - Abnormal; Notable for the following components:      Result Value   Color, UA orange (*)    Clarity, UA cloudy (*)    Glucose, UA =100 (*)    Bilirubin, UA small (*)    Ketones, POC UA trace (5) (*)    POC PROTEIN,UA =100 (*)    Urobilinogen, UA 2.0 (*)    Nitrite, UA Positive (*)    Leukocytes, UA Large (3+) (*)    All other components within normal limits  URINE CULTURE    EKG   Radiology No results found.  Procedures Procedures (including critical care time)  Medications Ordered in UC Medications - No data to display  Initial Impression / Assessment and Plan / UC Course  I have reviewed the triage vital signs and the nursing notes.  Pertinent labs & imaging results that were available during my care of the patient were reviewed by me and considered in my medical decision making (see chart for details).      Final Clinical Impressions(s) / UC Diagnoses   Final diagnoses:  Dysuria  Urinary tract infection with hematuria, site unspecified    Patient reports symptoms comprised of the following: dysuria, increased urinary frequency since yesterday  Results of UA are consistent with UTI - urine sample sent for culture to determine causative organism and susceptibility- results to dictate further management  Recommend starting Macrobid  as pt has eGFR >60 per most recent results on 02/20/24 Will provide script - discussed importance of finishing entire course of abx and staying well hydrated while recovering from UTI Reviewed ED precautions with patient Follow up as needed for persistent or worsening symptoms     Discharge Instructions      Based on your symptoms and results of the urinalysis I believe you have a UTI I recommend the following:  I  have sent in a script for Macrobid  100 mg to be taken by mouth twice per day for 5 days.  Please finish the entire course of the antibiotic even if you are feeling better before it is completed unless you develop an allergic reaction or are told by a medical provider to stop taking it. We have sent a sample of your urine off for a urine culture.  This will help us  determine what bacteria is causing your symptoms as well as the most appropriate antibiotic to treat it.  If we need to make any adjustments to your medication regimen and new medication will be sent to the pharmacy on file and  you will be updated via phone call in MyChart. Stay well hydrated (at least 75 oz of water  per day) and avoid holding your urine for prolonged periods of time. If you have any of the following please return to urgent care or go to the emergency room: Persistent symptoms, fever, trouble urinating or inability to urinate, confusion, flank pain.       ED Prescriptions     Medication Sig Dispense Auth. Provider   nitrofurantoin , macrocrystal-monohydrate, (MACROBID ) 100 MG capsule Take 1 capsule (100 mg total) by mouth 2 (two) times daily. 10 capsule Fionnuala Hemmerich E, PA-C      PDMP not reviewed this encounter.     [1]  Social History Tobacco Use   Smoking status: Never   Smokeless tobacco: Never  Vaping Use   Vaping status: Never Used  Substance Use Topics   Alcohol  use: Not Currently   Drug use: Never     Marylene Rocky BRAVO, PA-C 02/23/24 [id number]  "

## 2024-02-23 NOTE — Discharge Instructions (Signed)
 Based on your symptoms and results of the urinalysis I believe you have a UTI I recommend the following:  I have sent in a script for Macrobid 100 mg to be taken by mouth twice per day for 5 days Please finish the entire course of the antibiotic even if you are feeling better before it is completed unless you develop an allergic reaction or are told by a medical provider to stop taking it. We have sent a sample of your urine off for a urine culture.  This will help us  determine what bacteria is causing your symptoms as well as the most appropriate antibiotic to treat it.  If we need to make any adjustments to your medication regimen and new medication will be sent to the pharmacy on file and you will be updated via phone call in MyChart. Stay well hydrated (at least 75 oz of water per day) and avoid holding your urine for prolonged periods of time. If you have any of the following please return to urgent care or go to the emergency room: Persistent symptoms, fever, trouble urinating or inability to urinate, confusion, flank pain.

## 2024-02-23 NOTE — ED Triage Notes (Signed)
 Pt presents with c/o urinary burning and frequency. Symptoms began yesterday. Took two AZO tablets yesterday at the onset of symptoms. Helped with the burning. Currently denies pain sitting in triage.

## 2024-02-25 LAB — URINE CULTURE

## 2024-02-26 ENCOUNTER — Ambulatory Visit (HOSPITAL_COMMUNITY): Payer: Self-pay

## 2024-03-19 ENCOUNTER — Other Ambulatory Visit: Payer: Self-pay | Admitting: Neurological Surgery

## 2024-03-19 DIAGNOSIS — M48061 Spinal stenosis, lumbar region without neurogenic claudication: Secondary | ICD-10-CM

## 2024-03-20 ENCOUNTER — Telehealth: Payer: Self-pay | Admitting: *Deleted

## 2024-03-20 ENCOUNTER — Encounter: Payer: Self-pay | Admitting: Family Medicine

## 2024-03-20 ENCOUNTER — Ambulatory Visit: Admitting: Family Medicine

## 2024-03-20 ENCOUNTER — Other Ambulatory Visit (HOSPITAL_COMMUNITY): Payer: Self-pay

## 2024-03-20 ENCOUNTER — Telehealth: Payer: Self-pay

## 2024-03-20 VITALS — BP 122/68 | HR 67 | Ht 64.0 in | Wt 156.0 lb

## 2024-03-20 DIAGNOSIS — M810 Age-related osteoporosis without current pathological fracture: Secondary | ICD-10-CM

## 2024-03-20 DIAGNOSIS — F418 Other specified anxiety disorders: Secondary | ICD-10-CM | POA: Diagnosis not present

## 2024-03-20 NOTE — Patient Instructions (Signed)
 Follow up as needed or as scheduled No med changes at this time Continue to focus on one thing at a time, one day at a time You are allowed to feel.  You are allowed to be frustrated. Make sure you are taking care of YOU! Call with any questions or concerns Hang in there!

## 2024-03-20 NOTE — Telephone Encounter (Signed)
 Prolia  VOB initiated via [email or web address]  Next Prolia  inj DUE: 04/11/24

## 2024-03-20 NOTE — Telephone Encounter (Signed)
 Prolia  BIV in separate encounter. Will route encounter back once benefit verification is complete.    Please follow CAD Authorization workflow and place a CAM order to initiate Prolia  Benefits investigation. Standard turnaround time is 2 weeks.

## 2024-03-20 NOTE — Assessment & Plan Note (Signed)
 Order next Prolia  and repeat DEXA ordered

## 2024-03-20 NOTE — Assessment & Plan Note (Signed)
 Ongoing issue.  Discussed increasing Sertraline  to 100mg  daily but pt is not interested at this time.  Feels like husband is minimizing her pain and how she is feeling.  This is understandably distressing for her.  Encouraged her to talk to someone about all that is going on.  Will follow closely.

## 2024-03-20 NOTE — Progress Notes (Signed)
" ° °  Subjective:    Patient ID: Tracy [name], female    DOB: Aug 02, 1950, 74 y.o.   MRN: [id number]  HPI Depression w/ anxiety- at last visit we started Sertraline  50mg  daily.  Pt feels medication is helping.  Today is very tearful when talking about her ongoing back pain and medical issues.  Osteoporosis- due for Prolia  injection next month.  Overdue for DEXA.     Review of Systems For ROS see HPI     Objective:   Physical Exam Vitals reviewed.  Constitutional:      General: She is not in acute distress.    Appearance: Normal appearance. She is not ill-appearing.  HENT:     Head: Normocephalic and atraumatic.  Eyes:     Extraocular Movements: Extraocular movements intact.     Conjunctiva/sclera: Conjunctivae normal.  Cardiovascular:     Rate and Rhythm: Normal rate and regular rhythm.  Pulmonary:     Effort: Pulmonary effort is normal. No respiratory distress.  Skin:    General: Skin is warm and dry.  Neurological:     Mental Status: She is alert and oriented to person, place, and time. Mental status is at baseline.  Psychiatric:     Comments: Very tearful, overwhelmed           Assessment & Plan:    "

## 2024-03-20 NOTE — Telephone Encounter (Signed)
 Pt due for Prolia  next month. Routing to PA team for BOV

## 2024-03-21 ENCOUNTER — Other Ambulatory Visit (HOSPITAL_COMMUNITY): Payer: Self-pay

## 2024-03-21 ENCOUNTER — Other Ambulatory Visit: Payer: Self-pay | Admitting: *Deleted

## 2024-03-21 DIAGNOSIS — M810 Age-related osteoporosis without current pathological fracture: Secondary | ICD-10-CM

## 2024-03-21 MED ORDER — DENOSUMAB 60 MG/ML ~~LOC~~ SOSY: 60.0000 mg | PREFILLED_SYRINGE | SUBCUTANEOUS | Status: AC

## 2024-03-21 NOTE — Telephone Encounter (Signed)
 CAM order placed. I thought there was one already placed when I checked the referral workque.

## 2024-03-21 NOTE — Telephone Encounter (Signed)
 SABRA

## 2024-03-21 NOTE — Telephone Encounter (Signed)
 Pt ready for scheduling for PROLIA  on or after : 04/11/24  Option# 1: Buy/Bill (Office supplied medication)  Out-of-pocket cost due at time of clinic visit: $392  Number of injection/visits approved: 2  Primary: [employer] Prolia  co-insurance: 20% Admin fee co-insurance: $40  Secondary: --- Prolia  co-insurance:  Admin fee co-insurance:   Medical Benefit Details: Date Benefits were checked: 03/20/24 Deductible: NO/ Coinsurance: 20%/ Admin Fee: $40  Prior Auth: APPROVED PA# [id number] Expiration Date: 10/04/23-10/03/24  # of doses approved: 2 ----------------------------------------------------------------------- Option# 2- Med Obtained from pharmacy: [facility] PREFERRED FOR PHARMACY BENEFIT   Pharmacy benefit: Copay [phone] (Paid to pharmacy) Admin Fee: $40 (Pay at clinic)  Prior Auth: N/A PA# Expiration Date:   # of doses approved:   If patient wants fill through the pharmacy benefit please send prescription to: [facility], and include estimated need by date in rx notes. Pharmacy will ship medication directly to the office.  Patient NOT eligible for Prolia  Copay Card. Copay Card can make patient's cost as little as $25. Link to apply: [email or web address]://[email or web address]  ** This summary of benefits is an estimation of the patient's out-of-pocket cost. Exact cost may very based on individual plan coverage.

## 2024-03-21 NOTE — Telephone Encounter (Signed)
 Tracy [name]

## 2024-04-01 ENCOUNTER — Encounter: Payer: Self-pay | Admitting: *Deleted

## 2024-04-01 NOTE — Telephone Encounter (Signed)
 See separate encounter

## 2024-04-03 ENCOUNTER — Ambulatory Visit
Admission: RE | Admit: 2024-04-03 | Discharge: 2024-04-03 | Disposition: A | Discharge status: Home / Self Care | Source: Ambulatory Visit | Attending: Neurological Surgery | Admitting: Neurological Surgery

## 2024-04-03 DIAGNOSIS — M48061 Spinal stenosis, lumbar region without neurogenic claudication: Secondary | ICD-10-CM

## 2024-04-11 ENCOUNTER — Encounter: Admitting: Family Medicine

## 2024-05-29 ENCOUNTER — Encounter

## 2024-10-29 ENCOUNTER — Encounter
